# Patient Record
Sex: Male | Born: 1975 | Race: Black or African American | Hispanic: No | Smoking: Former smoker
Health system: Southern US, Community
[De-identification: ages and names within clinical notes are randomized; demographics above are authoritative.]

## PROBLEM LIST (undated history)

## (undated) DIAGNOSIS — G4730 Sleep apnea, unspecified: Secondary | ICD-10-CM

## (undated) DIAGNOSIS — I10 Essential (primary) hypertension: Secondary | ICD-10-CM

## (undated) DIAGNOSIS — S0990XA Unspecified injury of head, initial encounter: Secondary | ICD-10-CM

## (undated) DIAGNOSIS — D649 Anemia, unspecified: Secondary | ICD-10-CM

## (undated) DIAGNOSIS — F329 Major depressive disorder, single episode, unspecified: Secondary | ICD-10-CM

## (undated) DIAGNOSIS — M1990 Unspecified osteoarthritis, unspecified site: Secondary | ICD-10-CM

## (undated) DIAGNOSIS — F419 Anxiety disorder, unspecified: Secondary | ICD-10-CM

## (undated) DIAGNOSIS — F32A Depression, unspecified: Secondary | ICD-10-CM

## (undated) DIAGNOSIS — T8859XA Other complications of anesthesia, initial encounter: Secondary | ICD-10-CM

## (undated) DIAGNOSIS — Z9289 Personal history of other medical treatment: Secondary | ICD-10-CM

## (undated) DIAGNOSIS — E785 Hyperlipidemia, unspecified: Secondary | ICD-10-CM

## (undated) DIAGNOSIS — T7840XA Allergy, unspecified, initial encounter: Secondary | ICD-10-CM

## (undated) DIAGNOSIS — K219 Gastro-esophageal reflux disease without esophagitis: Secondary | ICD-10-CM

## (undated) DIAGNOSIS — T148XXA Other injury of unspecified body region, initial encounter: Secondary | ICD-10-CM

## (undated) HISTORY — PX: WRIST SURGERY: SHX841

## (undated) HISTORY — DX: Hyperlipidemia, unspecified: E78.5

## (undated) HISTORY — DX: Allergy, unspecified, initial encounter: T78.40XA

## (undated) HISTORY — PX: ELBOW SURGERY: SHX618

## (undated) HISTORY — DX: Depression, unspecified: F32.A

## (undated) HISTORY — PX: NECK SURGERY: SHX720

## (undated) HISTORY — DX: Major depressive disorder, single episode, unspecified: F32.9

## (undated) HISTORY — PX: HIP SURGERY: SHX245

---

## 2007-01-08 ENCOUNTER — Emergency Department (HOSPITAL_COMMUNITY): Admission: EM | Admit: 2007-01-08 | Discharge: 2007-01-08 | Payer: Self-pay | Admitting: Emergency Medicine

## 2011-05-25 ENCOUNTER — Other Ambulatory Visit: Payer: Self-pay | Admitting: Family Medicine

## 2011-05-25 ENCOUNTER — Emergency Department (HOSPITAL_COMMUNITY)
Admission: EM | Admit: 2011-05-25 | Discharge: 2011-05-25 | Disposition: A | Discharge status: Home / Self Care | Payer: 59 | Attending: Emergency Medicine | Admitting: Emergency Medicine

## 2011-05-25 ENCOUNTER — Emergency Department (HOSPITAL_COMMUNITY): Payer: 59

## 2011-05-25 ENCOUNTER — Inpatient Hospital Stay (INDEPENDENT_AMBULATORY_CARE_PROVIDER_SITE_OTHER)
Admission: RE | Admit: 2011-05-25 | Discharge: 2011-05-25 | Disposition: A | Discharge status: Home / Self Care | Payer: 59 | Source: Ambulatory Visit | Attending: Family Medicine | Admitting: Family Medicine

## 2011-05-25 DIAGNOSIS — K2970 Gastritis, unspecified, without bleeding: Secondary | ICD-10-CM | POA: Insufficient documentation

## 2011-05-25 DIAGNOSIS — R071 Chest pain on breathing: Secondary | ICD-10-CM

## 2011-05-25 DIAGNOSIS — E119 Type 2 diabetes mellitus without complications: Secondary | ICD-10-CM | POA: Insufficient documentation

## 2011-05-25 DIAGNOSIS — R1013 Epigastric pain: Secondary | ICD-10-CM | POA: Insufficient documentation

## 2011-05-25 DIAGNOSIS — K2990 Gastroduodenitis, unspecified, without bleeding: Secondary | ICD-10-CM | POA: Insufficient documentation

## 2011-05-25 DIAGNOSIS — I10 Essential (primary) hypertension: Secondary | ICD-10-CM | POA: Insufficient documentation

## 2011-05-25 DIAGNOSIS — Z79899 Other long term (current) drug therapy: Secondary | ICD-10-CM | POA: Insufficient documentation

## 2011-05-25 DIAGNOSIS — E669 Obesity, unspecified: Secondary | ICD-10-CM | POA: Insufficient documentation

## 2011-05-25 LAB — LIPASE, BLOOD
Lipase: 26 U/L (ref 11–59)
Lipase: 26 U/L (ref 11–59)

## 2011-05-25 LAB — GAMMA GT: GGT: 57 U/L — ABNORMAL HIGH (ref 7–51)

## 2011-05-25 LAB — DIFFERENTIAL
Basophils Absolute: 0 10*3/uL (ref 0.0–0.1)
Basophils Relative: 0 % (ref 0–1)
Eosinophils Absolute: 0.1 10*3/uL (ref 0.0–0.7)
Eosinophils Relative: 2 % (ref 0–5)
Lymphocytes Relative: 39 % (ref 12–46)
Lymphs Abs: 2.5 10*3/uL (ref 0.7–4.0)
Monocytes Absolute: 0.6 10*3/uL (ref 0.1–1.0)
Monocytes Relative: 8 % (ref 3–12)
Neutro Abs: 3.3 10*3/uL (ref 1.7–7.7)
Neutrophils Relative %: 51 % (ref 43–77)

## 2011-05-25 LAB — COMPREHENSIVE METABOLIC PANEL
ALT: 34 U/L (ref 0–53)
AST: 19 U/L (ref 0–37)
Albumin: 3.7 g/dL (ref 3.5–5.2)
Alkaline Phosphatase: 113 U/L (ref 39–117)
BUN: 12 mg/dL (ref 6–23)
CO2: 25 mEq/L (ref 19–32)
Calcium: 9 mg/dL (ref 8.4–10.5)
Chloride: 102 mEq/L (ref 96–112)
Creatinine, Ser: 0.82 mg/dL (ref 0.50–1.35)
GFR calc Af Amer: >60 mL/min (ref >60)
GFR calc non Af Amer: >60 mL/min (ref >60)
Glucose, Bld: 97 mg/dL (ref 70–99)
Potassium: 3.5 mEq/L (ref 3.5–5.1)
Sodium: 138 mEq/L (ref 135–145)
Total Bilirubin: 0.5 mg/dL (ref 0.3–1.2)
Total Protein: 7.4 g/dL (ref 6.0–8.3)

## 2011-05-25 LAB — CBC
HCT: 42 % (ref 39.0–52.0)
Hemoglobin: 15 g/dL (ref 13.0–17.0)
MCH: 31.4 pg (ref 26.0–34.0)
MCHC: 35.7 g/dL (ref 30.0–36.0)
MCV: 87.9 fL (ref 78.0–100.0)
Platelets: 210 10*3/uL (ref 150–400)
RBC: 4.78 MIL/uL (ref 4.22–5.81)
RDW: 12.6 % (ref 11.5–15.5)
WBC: 6.5 10*3/uL (ref 4.0–10.5)

## 2011-05-25 LAB — CK TOTAL AND CKMB (NOT AT ARMC)
CK, MB: 2.9 ng/mL (ref 0.3–4.0)
Relative Index: 1.2 (ref 0.0–2.5)
Total CK: 239 U/L — ABNORMAL HIGH (ref 7–232)

## 2011-05-25 LAB — TROPONIN I: Troponin I: <0.3 ng/mL (ref <0.30)

## 2011-05-25 LAB — AST: AST: 19 U/L (ref 0–37)

## 2011-05-25 LAB — BILIRUBIN, TOTAL: Total Bilirubin: 0.6 mg/dL (ref 0.3–1.2)

## 2011-05-25 LAB — ALT: ALT: 33 U/L (ref 0–53)

## 2011-05-28 LAB — POCT I-STAT, CHEM 8
BUN: 11 mg/dL (ref 6–23)
Calcium, Ion: 1.17 mmol/L (ref 1.12–1.32)
Chloride: 103 mEq/L (ref 96–112)
Creatinine, Ser: 0.9 mg/dL (ref 0.50–1.35)
Glucose, Bld: 144 mg/dL — ABNORMAL HIGH (ref 70–99)
HCT: 50 % (ref 39.0–52.0)
Hemoglobin: 17 g/dL (ref 13.0–17.0)
Potassium: 3.8 mEq/L (ref 3.5–5.1)
Sodium: 139 mEq/L (ref 135–145)
TCO2: 25 mmol/L (ref 0–100)

## 2011-08-29 ENCOUNTER — Ambulatory Visit (HOSPITAL_COMMUNITY)
Admission: RE | Admit: 2011-08-29 | Discharge: 2011-08-29 | Disposition: A | Discharge status: Home / Self Care | Payer: 59 | Attending: Psychiatry | Admitting: Psychiatry

## 2011-08-29 DIAGNOSIS — F411 Generalized anxiety disorder: Secondary | ICD-10-CM | POA: Insufficient documentation

## 2012-04-08 ENCOUNTER — Emergency Department (HOSPITAL_COMMUNITY)
Admission: EM | Admit: 2012-04-08 | Discharge: 2012-04-08 | Disposition: A | Discharge status: Home / Self Care | Payer: 59 | Source: Home / Self Care | Attending: Family Medicine | Admitting: Family Medicine

## 2012-04-08 ENCOUNTER — Encounter (HOSPITAL_COMMUNITY): Payer: Self-pay | Admitting: Emergency Medicine

## 2012-04-08 DIAGNOSIS — M65839 Other synovitis and tenosynovitis, unspecified forearm: Secondary | ICD-10-CM

## 2012-04-08 DIAGNOSIS — M778 Other enthesopathies, not elsewhere classified: Secondary | ICD-10-CM

## 2012-04-08 HISTORY — DX: Anxiety disorder, unspecified: F41.9

## 2012-04-08 HISTORY — DX: Essential (primary) hypertension: I10

## 2012-04-08 MED ORDER — DICLOFENAC SODIUM 1 % TD GEL: 1.0000 "application " | Freq: Four times a day (QID) | TRANSDERMAL | Status: DC

## 2012-04-08 NOTE — ED Notes (Signed)
Applied small ice pack to patient's lt forearm with sling for support

## 2012-04-08 NOTE — Discharge Instructions (Signed)
Use ice , gel and splint until  Pain and swelling resolve, see orthopedist if further problems.

## 2012-04-08 NOTE — ED Notes (Signed)
Left wrist pain and swelling, pain is a shooting pain.  Patient reports complaints started after getting up.  Patient reports repetitive motions.  Thumb is painful to move.

## 2012-04-08 NOTE — ED Provider Notes (Signed)
History     CSN: [id number]  Arrival date & time 04/08/12  1623   First MD Initiated Contact with Patient 04/08/12 1623      Chief Complaint  Patient presents with  . Wrist Pain    (Consider location/radiation/quality/duration/timing/severity/associated sxs/prior treatment) Patient is a 36 y.o. male presenting with wrist pain. The history is provided by the patient.  Wrist Pain This is a new problem. The current episode started 6 to 12 hours ago (onset after getting to work this am and doing repetitive job with both hands.). The problem has been gradually worsening.    Past Medical History  Diagnosis Date  . Diabetes mellitus   . Hypertension   . Anxiety     Past Surgical History  Procedure Date  . Hip surgery     "pins in hips"-"growth plate slipped"    No family history on file.  History  Substance Use Topics  . Smoking status: Current Everyday Smoker  . Smokeless tobacco: Not on file  . Alcohol Use: No      Review of Systems  Constitutional: Negative.   Musculoskeletal: Positive for joint swelling.  Neurological: Negative.     Allergies  Review of patient's allergies indicates no known allergies.  Home Medications   Current Outpatient Rx  Name Route Sig Dispense Refill  . IBUPROFEN 800 MG PO TABS Oral Take 800 mg by mouth every 8 (eight) hours as needed.    Marland Kitchen METFORMIN HCL PO Oral Take by mouth.    Marland Kitchen OVER THE COUNTER MEDICATION  insulin    . PAXIL PO Oral Take by mouth.    . DUETACT PO Oral Take by mouth.    . DICLOFENAC SODIUM 1 % TD GEL Topical Apply 1 application topically 4 (four) times daily. 4 gm per dose, please instruct. 100 g 1    BP 145/81  Pulse 94  Temp(Src) 99.3 F (37.4 C) (Oral)  Resp 20  SpO2 100%  Physical Exam  Nursing note and vitals reviewed. Constitutional: He is oriented to person, place, and time. He appears well-developed and well-nourished.  Musculoskeletal: He exhibits tenderness.       Arms: Neurological: He is  alert and oriented to person, place, and time. No cranial nerve deficit.  Skin: Skin is warm and dry.    ED Course  Procedures (including critical care time)  Labs Reviewed - No data to display No results found.   1. Tendonitis of wrist, left       MDM          Linna Hoff, MD 04/08/12 1743

## 2012-05-26 ENCOUNTER — Other Ambulatory Visit: Payer: Self-pay | Admitting: Occupational Medicine

## 2012-05-26 ENCOUNTER — Ambulatory Visit: Payer: Self-pay

## 2012-05-26 DIAGNOSIS — M25532 Pain in left wrist: Secondary | ICD-10-CM

## 2013-03-01 ENCOUNTER — Telehealth: Payer: Self-pay | Admitting: Family Medicine

## 2013-03-01 NOTE — Telephone Encounter (Signed)
PIOGLIAZ-GLIMEPIRIDE 30-4 MG TAKE 1 TABLET EVERYDAY #30 LAST RF 11/18/2012    TRIBENZOR 25 MG TAKE 1 TABLET EVERYDAY #30 LAST RF 11/18/2012  METFORMIN 1000MG  TAKE 1 TABLET TWICE DAILY #60 LAST RF 11/18/2012

## 2013-03-02 MED ORDER — PIOGLITAZONE HCL-GLIMEPIRIDE 30-2 MG PO TABS: 1.0000 | ORAL_TABLET | Freq: Every day | ORAL | Status: DC

## 2013-03-02 MED ORDER — METFORMIN HCL 1000 MG PO TABS: 1000.0000 mg | ORAL_TABLET | Freq: Two times a day (BID) | ORAL | Status: DC

## 2013-03-02 MED ORDER — OLMESARTAN-AMLODIPINE-HCTZ 40-10-25 MG PO TABS: 1.0000 | ORAL_TABLET | Freq: Every day | ORAL | Status: DC

## 2013-03-02 NOTE — Telephone Encounter (Signed)
Medication refilled per protocol.Patient needs to be seen before any further refills 

## 2013-03-02 NOTE — Addendum Note (Signed)
Addended by: Donne Anon on: 03/02/2013 11:59 AM   Modules accepted: Orders

## 2013-06-18 ENCOUNTER — Encounter (HOSPITAL_COMMUNITY): Payer: Self-pay | Admitting: Emergency Medicine

## 2013-06-18 ENCOUNTER — Emergency Department (HOSPITAL_COMMUNITY)
Admission: EM | Admit: 2013-06-18 | Discharge: 2013-06-18 | Disposition: A | Discharge status: Home / Self Care | Payer: 59 | Attending: Emergency Medicine | Admitting: Emergency Medicine

## 2013-06-18 DIAGNOSIS — E119 Type 2 diabetes mellitus without complications: Secondary | ICD-10-CM | POA: Insufficient documentation

## 2013-06-18 DIAGNOSIS — F17200 Nicotine dependence, unspecified, uncomplicated: Secondary | ICD-10-CM | POA: Insufficient documentation

## 2013-06-18 DIAGNOSIS — Z794 Long term (current) use of insulin: Secondary | ICD-10-CM | POA: Insufficient documentation

## 2013-06-18 DIAGNOSIS — R0989 Other specified symptoms and signs involving the circulatory and respiratory systems: Secondary | ICD-10-CM | POA: Insufficient documentation

## 2013-06-18 DIAGNOSIS — R0789 Other chest pain: Secondary | ICD-10-CM | POA: Insufficient documentation

## 2013-06-18 DIAGNOSIS — R0609 Other forms of dyspnea: Secondary | ICD-10-CM | POA: Insufficient documentation

## 2013-06-18 DIAGNOSIS — I10 Essential (primary) hypertension: Secondary | ICD-10-CM | POA: Insufficient documentation

## 2013-06-18 DIAGNOSIS — Z79899 Other long term (current) drug therapy: Secondary | ICD-10-CM | POA: Insufficient documentation

## 2013-06-18 DIAGNOSIS — F410 Panic disorder [episodic paroxysmal anxiety] without agoraphobia: Secondary | ICD-10-CM | POA: Insufficient documentation

## 2013-06-18 MED ORDER — LORAZEPAM 2 MG/ML IJ SOLN
1.0000 mg | INTRAMUSCULAR | Status: DC | PRN
  Administered 2013-06-18: 1 mg via INTRAVENOUS
  Filled 2013-06-18: qty 1

## 2013-06-18 MED ORDER — LORAZEPAM 1 MG PO TABS: 1.0000 mg | ORAL_TABLET | Freq: Three times a day (TID) | ORAL | Status: DC | PRN

## 2013-06-18 NOTE — ED Notes (Signed)
PER EMS- pt picked up from home with c/o anxiety attack.  Pt has hx of anxiety attack.  Pt reports he been under a lot of stress lately.  Pt has appt with psychiatrist within next couple weeks.  Sts he took a xanax from a friend, reports it helped his symptoms.

## 2013-06-18 NOTE — ED Notes (Signed)
Bed:WA05<BR> Expected date:<BR> Expected time:<BR> Means of arrival:<BR> Comments:<BR>

## 2013-06-18 NOTE — ED Provider Notes (Signed)
CSN: [id number]     Arrival date & time 06/18/13  1059 History     First MD Initiated Contact with Patient 06/18/13 1109     Chief Complaint  Patient presents with  . Panic Attack    HPI A 37 year old old history of panic attacks. He had an episode a day while at work where he started to feel anxious tired feel his breathing getting fast. His symptoms crescendoed he began feeling could not control his breathing. EMS was called by his coworkers he was transferred here. He arrives to get it. He has not had pain. He had an episode urine half ago for a significant episode of anxiety and transferred her to [facility] emergency room. He underwent testing and ruled out for an MI. Saw psychiatry and therapy. He had several more episodes over the next few months. He was on a 10 month to have absence from work because of his episodes of anxiety. Last March, he returned to work. He did well for a year. This March, 2014, he began having episodes of anxiety and contact his psychiatrist he is an intake appointment on August 19 which is less than 2 weeks from now.  He has pauses in the speech because of his dyspnea.   Past Medical History  Diagnosis Date  . Diabetes mellitus   . Hypertension   . Anxiety    Past Surgical History  Procedure Laterality Date  . Hip surgery      "pins in hips"-"growth plate slipped"   History reviewed. No pertinent family history. History  Substance Use Topics  . Smoking status: Current Every Day Smoker  . Smokeless tobacco: Not on file  . Alcohol Use: No    Review of Systems  Constitutional: Negative for fever, chills, diaphoresis, appetite change and fatigue.  HENT: Negative for sore throat, mouth sores and trouble swallowing.   Eyes: Negative for visual disturbance.  Respiratory: Positive for chest tightness. Negative for cough, shortness of breath and wheezing.   Cardiovascular: Positive for chest pain.  Gastrointestinal: Negative for nausea, vomiting, abdominal  pain, diarrhea and abdominal distention.  Endocrine: Negative for polydipsia, polyphagia and polyuria.  Genitourinary: Negative for dysuria, frequency and hematuria.  Musculoskeletal: Negative for gait problem.  Skin: Negative for color change, pallor and rash.  Neurological: Negative for dizziness, syncope, light-headedness and headaches.  Hematological: Does not bruise/bleed easily.  Psychiatric/Behavioral: Negative for behavioral problems and confusion. The patient is nervous/anxious.     Allergies  Review of patient's allergies indicates no known allergies.  Home Medications   Current Outpatient Rx  Name  Route  Sig  Dispense  Refill  . diclofenac sodium (VOLTAREN) 1 % GEL   Topical   Apply 2 g topically 4 (four) times daily as needed (For pain.).         Marland Kitchen ibuprofen (ADVIL,MOTRIN) 800 MG tablet   Oral   Take 800 mg by mouth every 8 (eight) hours as needed for pain.          . Insulin Glargine (LANTUS SOLOSTAR) 100 UNIT/ML SOPN   Subcutaneous   Inject 48 Units into the skin daily.         Marland Kitchen LORazepam (ATIVAN) 1 MG tablet   Oral   Take 1 tablet (1 mg total) by mouth 3 (three) times daily as needed for anxiety.   15 tablet   0   . metFORMIN (GLUCOPHAGE) 1000 MG tablet   Oral   Take 1 tablet (1,000 mg total) by mouth  2 (two) times daily with a meal.   60 tablet   0     Patient needs to be seen before any further refill ...   . Olmesartan-Amlodipine-HCTZ (TRIBENZOR) 40-10-25 MG TABS   Oral   Take 1 tablet by mouth daily.   30 tablet   0     Patient needs to be seen before any further refill ...   . PARoxetine (PAXIL) 20 MG tablet   Oral   Take 20 mg by mouth every morning.         . pioglitazone-glimepiride (DUETACT) 30-2 MG per tablet   Oral   Take 1 tablet by mouth daily with breakfast.   30 tablet   0     Patient needs to be seen before any further refill ...    There were no vitals taken for this visit. Physical Exam  Constitutional: He is  oriented to person, place, and time. He appears well-developed and well-nourished. He appears distressed.  HENT:  Head: Normocephalic.  Eyes: Conjunctivae are normal. Pupils are equal, round, and reactive to light. No scleral icterus.  Neck: Normal range of motion. Neck supple. No thyromegaly present.  Cardiovascular: Normal rate and regular rhythm.  Exam reveals no gallop, no S3, no S4, no distant heart sounds and no friction rub.   No murmur heard. Pulmonary/Chest: Breath sounds normal. Tachypnea noted. No respiratory distress. He has no decreased breath sounds. He has no wheezes. He has no rhonchi. He has no rales.  Abdominal: Soft. Bowel sounds are normal. He exhibits no distension. There is no tenderness. There is no rebound.  Musculoskeletal: Normal range of motion.  Neurological: He is alert and oriented to person, place, and time.  Skin: Skin is warm and dry. No rash noted.  Psychiatric: His behavior is normal. His mood appears anxious. His speech is rapid and/or pressured. Thought content is not delusional. Cognition and memory are not impaired. He does not express impulsivity.    ED Course   EKG interpretation normal sinus rhythm rate of 69 no ectopy no ST or T wave changes no change rhythm or morphology versus comparison to 05/25/2011. Procedures (including critical care time)  Labs Reviewed - No data to display No results found. 1. Panic attack     MDM  My initial impression is that this is anxiety his. His monitor shows a sinus rhythm. Plan will be EKG. Benzodiazepines.. Observation reevaluation for resolution of symptoms.  After the above I reevaluated Mr. Bartnik x2. His improving and that he is asleep he is awakening. He is able to ambulate he is not hypoxemic he felt like his control his heart rate. Plan will be to psychiatry for followup. And when necessary Ativan to use. He is given precautions regarding using as well driving or working. I left the decision as far as  his Paxil up to him. Advised him that he is to take it but it makes him sleepy to take it at work in a few hours of time at work and bedtime to sleep for his work again. If he choses, but he should not work with weakness. All additional outpatient treatment will defer to him and his psychiatrist  Claudean Kinds, MD 06/18/13 1259

## 2015-05-08 ENCOUNTER — Emergency Department
Admission: EM | Admit: 2015-05-08 | Discharge: 2015-05-08 | Disposition: A | Discharge status: Home / Self Care | Payer: 59 | Attending: Emergency Medicine | Admitting: Emergency Medicine

## 2015-05-08 ENCOUNTER — Encounter: Payer: Self-pay | Admitting: General Practice

## 2015-05-08 DIAGNOSIS — L02211 Cutaneous abscess of abdominal wall: Secondary | ICD-10-CM | POA: Diagnosis present

## 2015-05-08 DIAGNOSIS — I10 Essential (primary) hypertension: Secondary | ICD-10-CM | POA: Insufficient documentation

## 2015-05-08 DIAGNOSIS — L0291 Cutaneous abscess, unspecified: Secondary | ICD-10-CM

## 2015-05-08 DIAGNOSIS — E1165 Type 2 diabetes mellitus with hyperglycemia: Secondary | ICD-10-CM | POA: Diagnosis not present

## 2015-05-08 DIAGNOSIS — Z720 Tobacco use: Secondary | ICD-10-CM | POA: Insufficient documentation

## 2015-05-08 LAB — COMPREHENSIVE METABOLIC PANEL
ALT: 25 U/L (ref 17–63)
AST: 21 U/L (ref 15–41)
Albumin: 3.5 g/dL (ref 3.5–5.0)
Alkaline Phosphatase: 131 U/L — ABNORMAL HIGH (ref 38–126)
Anion gap: 8 (ref 5–15)
BUN: 15 mg/dL (ref 6–20)
CO2: 26 mmol/L (ref 22–32)
Calcium: 9.1 mg/dL (ref 8.9–10.3)
Chloride: 99 mmol/L — ABNORMAL LOW (ref 101–111)
Creatinine, Ser: 0.89 mg/dL (ref 0.61–1.24)
GFR calc Af Amer: >60 mL/min (ref >60)
GFR calc non Af Amer: >60 mL/min (ref >60)
Glucose, Bld: 446 mg/dL — ABNORMAL HIGH (ref 65–99)
Potassium: 4.4 mmol/L (ref 3.5–5.1)
Sodium: 133 mmol/L — ABNORMAL LOW (ref 135–145)
Total Bilirubin: 0.3 mg/dL (ref 0.3–1.2)
Total Protein: 7.4 g/dL (ref 6.5–8.1)

## 2015-05-08 LAB — CBC WITH DIFFERENTIAL/PLATELET
Basophils Absolute: 0.1 10*3/uL (ref 0–0.1)
Basophils Relative: 1 %
Eosinophils Absolute: 0.2 10*3/uL (ref 0–0.7)
Eosinophils Relative: 3 %
HCT: 45.7 % (ref 40.0–52.0)
Hemoglobin: 15.2 g/dL (ref 13.0–18.0)
Lymphocytes Relative: 28 %
Lymphs Abs: 2 10*3/uL (ref 1.0–3.6)
MCH: 30.4 pg (ref 26.0–34.0)
MCHC: 33.2 g/dL (ref 32.0–36.0)
MCV: 91.7 fL (ref 80.0–100.0)
Monocytes Absolute: 0.5 10*3/uL (ref 0.2–1.0)
Monocytes Relative: 8 %
Neutro Abs: 4.3 10*3/uL (ref 1.4–6.5)
Neutrophils Relative %: 60 %
Platelets: 204 10*3/uL (ref 150–440)
RBC: 4.98 MIL/uL (ref 4.40–5.90)
RDW: 13 % (ref 11.5–14.5)
WBC: 7.1 10*3/uL (ref 3.8–10.6)

## 2015-05-08 LAB — GLUCOSE, CAPILLARY: Glucose-Capillary: 380 mg/dL — ABNORMAL HIGH (ref 65–99)

## 2015-05-08 MED ORDER — SODIUM CHLORIDE 0.9 % IV SOLN
Freq: Once | INTRAVENOUS | Status: AC
  Administered 2015-05-08: 22:00:00 via INTRAVENOUS

## 2015-05-08 MED ORDER — MUPIROCIN CALCIUM 2 % EX CREA: TOPICAL_CREAM | CUTANEOUS | Status: DC

## 2015-05-08 MED ORDER — GLIPIZIDE 5 MG PO TABS: 5.0000 mg | ORAL_TABLET | Freq: Two times a day (BID) | ORAL | Status: DC

## 2015-05-08 MED ORDER — MUPIROCIN CALCIUM 2 % EX CREA
TOPICAL_CREAM | Freq: Once | CUTANEOUS | Status: AC
  Administered 2015-05-08: 23:00:00 via TOPICAL
  Filled 2015-05-08: qty 15

## 2015-05-08 MED ORDER — SULFAMETHOXAZOLE-TRIMETHOPRIM 800-160 MG PO TABS
ORAL_TABLET | ORAL | Status: AC
  Filled 2015-05-08: qty 2

## 2015-05-08 MED ORDER — METFORMIN HCL 1000 MG PO TABS: 1000.0000 mg | ORAL_TABLET | Freq: Two times a day (BID) | ORAL | Status: DC

## 2015-05-08 MED ORDER — INSULIN ASPART 100 UNIT/ML ~~LOC~~ SOLN
5.0000 [IU] | Freq: Once | SUBCUTANEOUS | Status: AC
  Administered 2015-05-08: 5 [IU] via SUBCUTANEOUS
  Filled 2015-05-08: qty 0.05

## 2015-05-08 MED ORDER — SULFAMETHOXAZOLE-TRIMETHOPRIM 800-160 MG PO TABS
2.0000 | ORAL_TABLET | Freq: Once | ORAL | Status: AC
  Administered 2015-05-08: 2 via ORAL

## 2015-05-08 MED ORDER — SULFAMETHOXAZOLE-TRIMETHOPRIM 800-160 MG PO TABS: 1.0000 | ORAL_TABLET | Freq: Two times a day (BID) | ORAL | Status: DC

## 2015-05-08 MED ORDER — INSULIN ASPART 100 UNIT/ML ~~LOC~~ SOLN
SUBCUTANEOUS | Status: AC
  Filled 2015-05-08: qty 1

## 2015-05-08 NOTE — ED Provider Notes (Signed)
[facility] Emergency Department Provider Note     Time seen: ----------------------------------------- 8:37 PM on 05/08/2015 -----------------------------------------    I have reviewed the triage vital signs and the nursing notes.   HISTORY  Chief Complaint Abscess    HPI Dwayne Rocha is a 39 y.o. male who presents ER from home with reports of expressive abscess to his lower abdominal wall. Patient states he has had an abscess about a month ago, states this was gotten bad with drainage. Complaint of dull pain there, nothing makes better worse. Patient also notes she's been off of his diabetes medication for some time. Most of his medication problems are insurance related. States he thinks she's had a fever, is not sure when he checked his blood sugar last   Past Medical History  Diagnosis Date  . Diabetes mellitus   . Hypertension   . Anxiety     There are no active problems to display for this patient.   Past Surgical History  Procedure Laterality Date  . Hip surgery      "pins in hips"-"growth plate slipped"  . Wrist surgery      Allergies Review of patient's allergies indicates no known allergies.  Social History History  Substance Use Topics  . Smoking status: Current Every Day Smoker -- 0.50 packs/day    Types: Cigarettes  . Smokeless tobacco: Not on file  . Alcohol Use: No    Review of Systems Constitutional: Positive for fever Eyes: Negative for visual changes. ENT: Negative for sore throat. Cardiovascular: Negative for chest pain. Respiratory: Negative for shortness of breath. Gastrointestinal: Negative for abdominal pain, vomiting and diarrhea. Genitourinary: Negative for dysuria. Musculoskeletal: Negative for back pain. Skin: Positive for abscess and drainage from the lower abdominal wall Neurological: Negative for headaches, focal weakness or numbness.  10-point ROS otherwise  negative.  ____________________________________________   PHYSICAL EXAM:  VITAL SIGNS: ED Triage Vitals  Enc Vitals Group     BP 05/08/15 1847 151/91 mmHg     Pulse Rate 05/08/15 1847 100     Resp 05/08/15 1847 19     Temp 05/08/15 1847 99.1 F (37.3 C)     Temp Source 05/08/15 1847 Oral     SpO2 05/08/15 1847 99 %     Weight 05/08/15 1847 295 lb (133.811 kg)     Height 05/08/15 1847 5\' 11"  (1.803 m)     Head Cir --      Peak Flow --      Pain Score 05/08/15 1848 6     Pain Loc --      Pain Edu? --      Excl. in GC? --     Constitutional: Alert and oriented. Well appearing and in no distress. Eyes: Conjunctivae are normal. PERRL. Normal extraocular movements. ENT   Head: Normocephalic and atraumatic.   Nose: No congestion/rhinnorhea.   Mouth/Throat: Mucous membranes are moist.   Neck: No stridor. Hematological/Lymphatic/Immunilogical: No cervical lymphadenopathy. Cardiovascular: Normal rate, regular rhythm. Normal and symmetric distal pulses are present in all extremities. No murmurs, rubs, or gallops. Respiratory: Normal respiratory effort without tachypnea nor retractions. Breath sounds are clear and equal bilaterally. No wheezes/rales/rhonchi. Gastrointestinal: Soft and nontender. No distention. No abdominal bruits. There is no CVA tenderness. Musculoskeletal: Nontender with normal range of motion in all extremities. No joint effusions.  No lower extremity tenderness nor edema. Neurologic:  Normal speech and language. No gross focal neurologic deficits are appreciated. Speech is normal. No gait instability. Skin:  Draining abscess is noted to the suprapubic region on the abdominal wall. Area is mildly tender to touch. Psychiatric: Mood and affect are normal. Speech and behavior are normal. Patient exhibits appropriate insight and judgment. ____________________________________________  ED COURSE:  Pertinent labs & imaging results that were available during  my care of the patient were reviewed by me and considered in my medical decision making (see chart for details). Patient will receive oral antibiotics, IV fluid and subcutaneous insulin to control his blood sugars here. He will also need Bactroban to cover for MRSA and proper wound dressings. ____________________________________________    LABS (pertinent positives/negatives)  Labs Reviewed  COMPREHENSIVE METABOLIC PANEL - Abnormal; Notable for the following:    Sodium 133 (*)    Chloride 99 (*)    Glucose, Bld 446 (*)    Alkaline Phosphatase 131 (*)    All other components within normal limits  CBC WITH DIFFERENTIAL/PLATELET    RADIOLOGY None  ____________________________________________  FINAL ASSESSMENT AND PLAN  Abscess, hyperglycemia  Plan: Patient received saline as well as subcutaneous insulin as dictated above. Blood sugars were trending down nicely. Patient will be restarted on Glucophage and will also add glipizide to his regimen. He'll be discharged with Septra to cover for his likely MRSA infection and Bactroban to be placed to the wound. He is also advised should the symptoms recur he can start treatment earlier with Bactroban. Advised to return for worsening or worrisome symptoms   Emily Filbert, MD   Emily Filbert, MD 05/08/15 2109

## 2015-05-08 NOTE — ED Notes (Signed)
Pharmacy called regarding bactroban, will send to ED

## 2015-05-08 NOTE — Discharge Instructions (Signed)
Abscess An abscess is an infected area that contains a collection of pus and debris.It can occur in almost any part of the body. An abscess is also known as a furuncle or boil. CAUSES  An abscess occurs when tissue gets infected. This can occur from blockage of oil or sweat glands, infection of hair follicles, or a minor injury to the skin. As the body tries to fight the infection, pus collects in the area and creates pressure under the skin. This pressure causes pain. People with weakened immune systems have difficulty fighting infections and get certain abscesses more often.  SYMPTOMS Usually an abscess develops on the skin and becomes a painful mass that is red, warm, and tender. If the abscess forms under the skin, you may feel a moveable soft area under the skin. Some abscesses break open (rupture) on their own, but most will continue to get worse without care. The infection can spread deeper into the body and eventually into the bloodstream, causing you to feel ill.  DIAGNOSIS  Your caregiver will take your medical history and perform a physical exam. A sample of fluid may also be taken from the abscess to determine what is causing your infection. TREATMENT  Your caregiver may prescribe antibiotic medicines to fight the infection. However, taking antibiotics alone usually does not cure an abscess. Your caregiver may need to make a small cut (incision) in the abscess to drain the pus. In some cases, gauze is packed into the abscess to reduce pain and to continue draining the area. HOME CARE INSTRUCTIONS   Only take over-the-counter or prescription medicines for pain, discomfort, or fever as directed by your caregiver.  If you were prescribed antibiotics, take them as directed. Finish them even if you start to feel better.  If gauze is used, follow your caregiver's directions for changing the gauze.  To avoid spreading the infection:  Keep your draining abscess covered with a  bandage.  Wash your hands well.  Do not share personal care items, towels, or whirlpools with others.  Avoid skin contact with others.  Keep your skin and clothes clean around the abscess.  Keep all follow-up appointments as directed by your caregiver. SEEK MEDICAL CARE IF:   You have increased pain, swelling, redness, fluid drainage, or bleeding.  You have muscle aches, chills, or a general ill feeling.  You have a fever. MAKE SURE YOU:   Understand these instructions.  Will watch your condition.  Will get help right away if you are not doing well or get worse. Document Released: 08/07/2005 Document Revised: 04/28/2012 Document Reviewed: 01/10/2012 [facility] Patient Information 2015 [place], [place]. This information is not intended to replace advice given to you by your health care provider. Make sure you discuss any questions you have with your health care provider.  Blood Glucose Monitoring Monitoring your blood glucose (also know as blood sugar) helps you to manage your diabetes. It also helps you and your health care provider monitor your diabetes and determine how well your treatment plan is working. WHY SHOULD YOU MONITOR YOUR BLOOD GLUCOSE?  It can help you understand how food, exercise, and medicine affect your blood glucose.  It allows you to know what your blood glucose is at any given moment. You can quickly tell if you are having low blood glucose (hypoglycemia) or high blood glucose (hyperglycemia).  It can help you and your health care provider know how to adjust your medicines.  It can help you understand how to manage an  illness or adjust medicine for exercise. °WHEN SHOULD YOU TEST? °Your health care provider will help you decide how often you should check your blood glucose. This may depend on the type of diabetes you have, your diabetes control, or the types of medicines you are taking. Be sure to write down all of your blood glucose readings so that this  information can be reviewed with your health care provider. See below for examples of testing times that your health care provider may suggest. °Type 1 Diabetes °· Test 4 times a day if you are in good control, using an insulin pump, or perform multiple daily injections. °· If your diabetes is not well controlled or if you are sick, you may need to monitor more often. °· It is a good idea to also monitor: °¨ Before and after exercise. °¨ Between meals and 2 hours after a meal. °¨ Occasionally between 2:00 a.m. and 3:00 a.m. °Type 2 Diabetes °· It can vary with each person, but generally, if you are on insulin, test 4 times a day. °· If you take medicines by mouth (orally), test 2 times a day. °· If you are on a controlled diet, test once a day. °· If your diabetes is not well controlled or if you are sick, you may need to monitor more often. °HOW TO MONITOR YOUR BLOOD GLUCOSE °Supplies Needed °· Blood glucose meter. °· Test strips for your meter. Each meter has its own strips. You must use the strips that go with your own meter. °· A pricking needle (lancet). °· A device that holds the lancet (lancing device). °· A journal or log book to write down your results. °Procedure °· Wash your hands with soap and water. Alcohol is not preferred. °· Prick the side of your finger (not the tip) with the lancet. °· Gently milk the finger until a small drop of blood appears. °· Follow the instructions that come with your meter for inserting the test strip, applying blood to the strip, and using your blood glucose meter. °Other Areas to Get Blood for Testing °Some meters allow you to use other areas of your body (other than your finger) to test your blood. These areas are called alternative sites. The most common alternative sites are: °· The forearm. °· The thigh. °· The back area of the lower leg. °· The palm of the hand. °The blood flow in these areas is slower. Therefore, the blood glucose values you get may be delayed, and  the numbers are different from what you would get from your fingers. Do not use alternative sites if you think you are having hypoglycemia. Your reading will not be accurate. Always use a finger if you are having hypoglycemia. Also, if you cannot feel your lows (hypoglycemia unawareness), always use your fingers for your blood glucose checks. °ADDITIONAL TIPS FOR GLUCOSE MONITORING °· Do not reuse lancets. °· Always carry your supplies with you. °· All blood glucose meters have a 24-hour "hotline" number to call if you have questions or need help. °· Adjust (calibrate) your blood glucose meter with a control solution after finishing a few boxes of strips. °BLOOD GLUCOSE RECORD KEEPING °It is a good idea to keep a daily record or log of your blood glucose readings. Most glucose meters, if not all, keep your glucose records stored in the meter. Some meters come with the ability to download your records to your home computer. Keeping a record of your blood glucose readings is especially helpful if   you are wanting to look for patterns. Make notes to go along with the blood glucose readings because you might forget what happened at that exact time. Keeping good records helps you and your health care provider to work together to achieve good diabetes management.  °Document Released: 10/31/2003 Document Revised: 03/14/2014 Document Reviewed: 03/22/2013 °[employer]® Patient Information ©2015 [employer]. This information is not intended to replace advice given to you by your health care provider. Make sure you discuss any questions you have with your health care provider. ° °

## 2015-05-08 NOTE — ED Notes (Signed)
Pt. Arrived to ed from home with reports of experiencing an abscess to lower abdomen. Pt verbalized that he experiences abscesses to this site but states "i has gotten bad", per pt. Drainage noted from site. Pt alert and oriented. Denies all over symptoms at this time.

## 2015-07-01 ENCOUNTER — Encounter: Payer: Self-pay | Admitting: Emergency Medicine

## 2015-07-01 ENCOUNTER — Emergency Department
Admission: EM | Admit: 2015-07-01 | Discharge: 2015-07-01 | Disposition: A | Discharge status: Home / Self Care | Payer: 59 | Attending: Emergency Medicine | Admitting: Emergency Medicine

## 2015-07-01 DIAGNOSIS — E1165 Type 2 diabetes mellitus with hyperglycemia: Secondary | ICD-10-CM | POA: Diagnosis not present

## 2015-07-01 DIAGNOSIS — R739 Hyperglycemia, unspecified: Secondary | ICD-10-CM

## 2015-07-01 DIAGNOSIS — I10 Essential (primary) hypertension: Secondary | ICD-10-CM | POA: Diagnosis not present

## 2015-07-01 DIAGNOSIS — Z794 Long term (current) use of insulin: Secondary | ICD-10-CM | POA: Insufficient documentation

## 2015-07-01 DIAGNOSIS — Z720 Tobacco use: Secondary | ICD-10-CM | POA: Insufficient documentation

## 2015-07-01 DIAGNOSIS — Z79899 Other long term (current) drug therapy: Secondary | ICD-10-CM | POA: Insufficient documentation

## 2015-07-01 DIAGNOSIS — L739 Follicular disorder, unspecified: Secondary | ICD-10-CM

## 2015-07-01 DIAGNOSIS — R220 Localized swelling, mass and lump, head: Secondary | ICD-10-CM | POA: Diagnosis present

## 2015-07-01 LAB — GLUCOSE, CAPILLARY: Glucose-Capillary: 341 mg/dL — ABNORMAL HIGH (ref 65–99)

## 2015-07-01 MED ORDER — SULFAMETHOXAZOLE-TRIMETHOPRIM 800-160 MG PO TABS
2.0000 | ORAL_TABLET | Freq: Once | ORAL | Status: AC
  Administered 2015-07-01: 2 via ORAL
  Filled 2015-07-01: qty 2

## 2015-07-01 MED ORDER — SULFAMETHOXAZOLE-TRIMETHOPRIM 800-160 MG PO TABS: 2.0000 | ORAL_TABLET | Freq: Two times a day (BID) | ORAL | Status: DC

## 2015-07-01 NOTE — ED Notes (Signed)
Pt states 'i have this lump under my chin". Pt denies pain, denies fever. Area is hard to touch, no redness noted.

## 2015-07-01 NOTE — Discharge Instructions (Signed)
Folliculitis  Folliculitis is redness, soreness, and swelling (inflammation) of the hair follicles. This condition can occur anywhere on the body. People with weakened immune systems, diabetes, or obesity have a greater risk of getting folliculitis. CAUSES  Bacterial infection. This is the most common cause.  Fungal infection.  Viral infection.  Contact with certain chemicals, especially oils and tars. Long-term folliculitis can result from bacteria that live in the nostrils. The bacteria may trigger multiple outbreaks of folliculitis over time. SYMPTOMS Folliculitis most commonly occurs on the scalp, thighs, legs, back, buttocks, and areas where hair is shaved frequently. An early sign of folliculitis is a small, white or yellow, pus-filled, itchy lesion (pustule). These lesions appear on a red, inflamed follicle. They are usually less than 0.2 inches (5 mm) wide. When there is an infection of the follicle that goes deeper, it becomes a boil or furuncle. A group of closely packed boils creates a larger lesion (carbuncle). Carbuncles tend to occur in hairy, sweaty areas of the body. DIAGNOSIS  Your caregiver can usually tell what is wrong by doing a physical exam. A sample may be taken from one of the lesions and tested in a lab. This can help determine what is causing your folliculitis. TREATMENT  Treatment may include:  Applying warm compresses to the affected areas.  Taking antibiotic medicines orally or applying them to the skin.  Draining the lesions if they contain a large amount of pus or fluid.  Laser hair removal for cases of long-lasting folliculitis. This helps to prevent regrowth of the hair. HOME CARE INSTRUCTIONS  Apply warm compresses to the affected areas as directed by your caregiver.  If antibiotics are prescribed, take them as directed. Finish them even if you start to feel better.  You may take over-the-counter medicines to relieve itching.  Do not shave  irritated skin.  Follow up with your caregiver as directed. SEEK IMMEDIATE MEDICAL CARE IF:   You have increasing redness, swelling, or pain in the affected area.  You have a fever. MAKE SURE YOU:  Understand these instructions.  Will watch your condition.  Will get help right away if you are not doing well or get worse. Document Released: 01/06/2002 Document Revised: 04/28/2012 Document Reviewed: 01/28/2012 [facility] Patient Information 2015 [place], [place]. This information is not intended to replace advice given to you by your health care provider. Make sure you discuss any questions you have with your health care provider.  Hyperglycemia Hyperglycemia occurs when the glucose (sugar) in your blood is too high. Hyperglycemia can happen for many reasons, but it most often happens to people who do not know they have diabetes or are not managing their diabetes properly.  CAUSES  Whether you have diabetes or not, there are other causes of hyperglycemia. Hyperglycemia can occur when you have diabetes, but it can also occur in other situations that you might not be as aware of, such as: Diabetes  If you have diabetes and are having problems controlling your blood glucose, hyperglycemia could occur because of some of the following reasons:  Not following your meal plan.  Not taking your diabetes medications or not taking it properly.  Exercising less or doing less activity than you normally do.  Being sick. Pre-diabetes  This cannot be ignored. Before people develop Type 2 diabetes, they almost always have "pre-diabetes." This is when your blood glucose levels are higher than normal, but not yet high enough to be diagnosed as diabetes. Research has shown that some long-term damage  to the body, especially the heart and circulatory system, may already be occurring during pre-diabetes. If you take action to manage your blood glucose when you have pre-diabetes, you may delay or prevent Type  2 diabetes from developing. Stress  If you have diabetes, you may be "diet" controlled or on oral medications or insulin to control your diabetes. However, you may find that your blood glucose is higher than usual in the hospital whether you have diabetes or not. This is often referred to as "stress hyperglycemia." Stress can elevate your blood glucose. This happens because of hormones put out by the body during times of stress. If stress has been the cause of your high blood glucose, it can be followed regularly by your caregiver. That way he/she can make sure your hyperglycemia does not continue to get worse or progress to diabetes. Steroids  Steroids are medications that act on the infection fighting system (immune system) to block inflammation or infection. One side effect can be a rise in blood glucose. Most people can produce enough extra insulin to allow for this rise, but for those who cannot, steroids make blood glucose levels go even higher. It is not unusual for steroid treatments to "uncover" diabetes that is developing. It is not always possible to determine if the hyperglycemia will go away after the steroids are stopped. A special blood test called an A1c is sometimes done to determine if your blood glucose was elevated before the steroids were started. SYMPTOMS  Thirsty.  Frequent urination.  Dry mouth.  Blurred vision.  Tired or fatigue.  Weakness.  Sleepy.  Tingling in feet or leg. DIAGNOSIS  Diagnosis is made by monitoring blood glucose in one or all of the following ways:  A1c test. This is a chemical found in your blood.  Fingerstick blood glucose monitoring.  Laboratory results. TREATMENT  First, knowing the cause of the hyperglycemia is important before the hyperglycemia can be treated. Treatment may include, but is not be limited to:  Education.  Change or adjustment in medications.  Change or adjustment in meal plan.  Treatment for an illness,  infection, etc.  More frequent blood glucose monitoring.  Change in exercise plan.  Decreasing or stopping steroids.  Lifestyle changes. HOME CARE INSTRUCTIONS   Test your blood glucose as directed.  Exercise regularly. Your caregiver will give you instructions about exercise. Pre-diabetes or diabetes which comes on with stress is helped by exercising.  Eat wholesome, balanced meals. Eat often and at regular, fixed times. Your caregiver or nutritionist will give you a meal plan to guide your sugar intake.  Being at an ideal weight is important. If needed, losing as little as 10 to 15 pounds may help improve blood glucose levels. SEEK MEDICAL CARE IF:   You have questions about medicine, activity, or diet.  You continue to have symptoms (problems such as increased thirst, urination, or weight gain). SEEK IMMEDIATE MEDICAL CARE IF:   You are vomiting or have diarrhea.  Your breath smells fruity.  You are breathing faster or slower.  You are very sleepy or incoherent.  You have numbness, tingling, or pain in your feet or hands.  You have chest pain.  Your symptoms get worse even though you have been following your caregiver's orders.  If you have any other questions or concerns. Document Released: 04/23/2001 Document Revised: 01/20/2012 Document Reviewed: 02/24/2012 [facility] Patient Information 2015 [place], [place]. This information is not intended to replace advice given to you by your health care  provider. Make sure you discuss any questions you have with your health care provider. ° °

## 2015-07-01 NOTE — ED Notes (Signed)
Patient with no complaints at this time. Respirations even and unlabored. Skin warm/dry. Discharge instructions reviewed with patient at this time. Patient given opportunity to voice concerns/ask questions. Patient discharged at this time and left Emergency Department with steady gait.   

## 2015-07-01 NOTE — ED Provider Notes (Addendum)
[facility] Emergency Department Provider Note  ____________________________________________  Time seen: Approximately 330 AM  I have reviewed the triage vital signs and the nursing notes.   HISTORY  Chief Complaint Abscess    HPI Dwayne Rocha is a 39 y.o. male with a history of diabetes who presents today with nonpainful swelling under his chin for the past 3 days. He said that he noticed this lump 3 days ago and has been slowly increasing in size. He says that it is nonpainful and there has been no drainage from the lump. He has no difficulty breathing or swallowing or controlling his secretions. No complaint of fever at home or sore throat. Says that he does get a lot of ingrown hairs when he shaves. He said that he last shaved yesterday as well as 5 days ago.   Past Medical History  Diagnosis Date  . Diabetes mellitus   . Hypertension   . Anxiety     There are no active problems to display for this patient.   Past Surgical History  Procedure Laterality Date  . Hip surgery      "pins in hips"-"growth plate slipped"  . Wrist surgery      Current Outpatient Rx  Name  Route  Sig  Dispense  Refill  . diclofenac sodium (VOLTAREN) 1 % GEL   Topical   Apply 2 g topically 4 (four) times daily as needed (For pain.).         Marland Kitchen glipiZIDE (GLUCOTROL) 5 MG tablet   Oral   Take 1 tablet (5 mg total) by mouth 2 (two) times daily.   60 tablet   11   . ibuprofen (ADVIL,MOTRIN) 800 MG tablet   Oral   Take 800 mg by mouth every 8 (eight) hours as needed for pain.          . Insulin Glargine (LANTUS SOLOSTAR) 100 UNIT/ML SOPN   Subcutaneous   Inject 48 Units into the skin daily.         Marland Kitchen LORazepam (ATIVAN) 1 MG tablet   Oral   Take 1 tablet (1 mg total) by mouth 3 (three) times daily as needed for anxiety.   15 tablet   0   . metFORMIN (GLUCOPHAGE) 1000 MG tablet   Oral   Take 1 tablet (1,000 mg total) by mouth 2 (two) times daily with  a meal.   60 tablet   0     Patient needs to be seen before any further refill ...   . metFORMIN (GLUCOPHAGE) 1000 MG tablet   Oral   Take 1 tablet (1,000 mg total) by mouth 2 (two) times daily with a meal.   60 tablet   11   . mupirocin cream (BACTROBAN) 2 %      Apply to affected area 3 times daily   30 g   0   . Olmesartan-Amlodipine-HCTZ (TRIBENZOR) 40-10-25 MG TABS   Oral   Take 1 tablet by mouth daily.   30 tablet   0     Patient needs to be seen before any further refill ...   . PARoxetine (PAXIL) 20 MG tablet   Oral   Take 20 mg by mouth every morning.         . pioglitazone-glimepiride (DUETACT) 30-2 MG per tablet   Oral   Take 1 tablet by mouth daily with breakfast.   30 tablet   0     Patient needs to be seen before any  further refill ...   . sulfamethoxazole-trimethoprim (BACTRIM DS) 800-160 MG per tablet   Oral   Take 1 tablet by mouth 2 (two) times daily.   20 tablet   0     Allergies Review of patient's allergies indicates no known allergies.  No family history on file.  Social History Social History  Substance Use Topics  . Smoking status: Current Every Day Smoker -- 0.50 packs/day    Types: Cigarettes  . Smokeless tobacco: None  . Alcohol Use: No    Review of Systems Constitutional: No fever/chills Eyes: No visual changes. ENT: No sore throat. Cardiovascular: Denies chest pain. Respiratory: Denies shortness of breath. Gastrointestinal: No abdominal pain.  No nausea, no vomiting.  No diarrhea.  No constipation. Genitourinary: Negative for dysuria. Musculoskeletal: Negative for back pain. Skin: Negative for rash. Neurological: Negative for headaches, focal weakness or numbness.  10-point ROS otherwise negative.  ____________________________________________   PHYSICAL EXAM:  VITAL SIGNS: ED Triage Vitals  Enc Vitals Group     BP 07/01/15 0127 155/92 mmHg     Pulse Rate 07/01/15 0127 99     Resp 07/01/15 0127 14      Temp 07/01/15 0127 98.8 F (37.1 C)     Temp Source 07/01/15 0127 Oral     SpO2 07/01/15 0127 96 %     Weight 07/01/15 0127 290 lb (131.543 kg)     Height 07/01/15 0127  (1.803 m)     Head Cir --      Peak Flow --      Pain Score --      Pain Loc --      Pain Edu? --      Excl. in GC? --     Constitutional: Alert and oriented. Well appearing and in no acute distress. Eyes: Conjunctivae are normal. PERRL. EOMI. Head: Atraumatic. Nose: No congestion/rhinnorhea. Mouth/Throat: Mucous membranes are moist.  Oropharynx non-erythematous. No edema to the floor the mouth. No swelling to the floor the mouth. Controlling secretions. Neck: No stridor.   Cardiovascular: Normal rate, regular rhythm. Grossly normal heart sounds.  Good peripheral circulation. Respiratory: Normal respiratory effort.  No retractions. Lungs CTAB. Gastrointestinal: Soft and nontender. No distention. No abdominal bruits. No CVA tenderness. Musculoskeletal: No lower extremity tenderness nor edema.  No joint effusions. Neurologic:  Normal speech and language. No gross focal neurologic deficits are appreciated. No gait instability. Skin: Multiple signs of trauma from shaving to the face as well as neck and under the mandible. Just posterior to the chin there is a palpable mass that is 3 cm in diameter. There is no fluctuance. There are several overlying areas that appears folliculitis. No pus or erythema to the area. The area is nontender. The mass is compressible and nonrigid. Psychiatric: Mood and affect are normal. Speech and behavior are normal.  ____________________________________________   LABS (all labs ordered are listed, but only abnormal results are displayed)  Labs Reviewed  GLUCOSE, CAPILLARY - Abnormal; Notable for the following:    Glucose-Capillary 341 (*)    All other components within normal limits    ____________________________________________  EKG   ____________________________________________  RADIOLOGY   ____________________________________________   PROCEDURES   ____________________________________________   INITIAL IMPRESSION / ASSESSMENT AND PLAN / ED COURSE  Pertinent labs & imaging results that were available during my care of the patient were reviewed by me and considered in my medical decision making (see chart for details).  Patient likely with furuncular reactive lymph node. Given  proximity to multiple areas of ingrown hairs/folliculitis I feel that the mass is likely reaction. Less likely to be Ludwig's angina. Patient controlling secretions well and in no acute respiratory distress. Glucose is elevated the patient says that he has been missing several doses of insulin from time to time. Counseled about the importance of keeping regular with his insulin as he may develop infections. Will follow-up with his primary care doctor for recheck of the mass as well as check of his glucose and insulin regimen. ____________________________________________   FINAL CLINICAL IMPRESSION(S) / ED DIAGNOSES  Acute submandibular mass likely reactive to folliculitis. Acute upper glycemia. Initial visit.    Myrna Blazer, MD 07/01/15 0410  No clinical signs of DKA despite hyperglycemia. No tachycardia, nausea vomiting or abdominal pain. Well appearing and in no acute distress.  Myrna Blazer, MD 07/01/15 [phone]

## 2015-09-30 ENCOUNTER — Inpatient Hospital Stay (HOSPITAL_COMMUNITY)
Admission: EM | Admit: 2015-09-30 | Discharge: 2015-10-04 | DRG: 603 | Disposition: A | Discharge status: Home / Self Care | Payer: 59 | Attending: Internal Medicine | Admitting: Internal Medicine

## 2015-09-30 ENCOUNTER — Encounter (HOSPITAL_COMMUNITY): Payer: Self-pay | Admitting: Emergency Medicine

## 2015-09-30 ENCOUNTER — Emergency Department (HOSPITAL_COMMUNITY): Payer: 59

## 2015-09-30 DIAGNOSIS — Z79899 Other long term (current) drug therapy: Secondary | ICD-10-CM | POA: Diagnosis not present

## 2015-09-30 DIAGNOSIS — K029 Dental caries, unspecified: Secondary | ICD-10-CM | POA: Diagnosis present

## 2015-09-30 DIAGNOSIS — F17210 Nicotine dependence, cigarettes, uncomplicated: Secondary | ICD-10-CM | POA: Diagnosis present

## 2015-09-30 DIAGNOSIS — Z7984 Long term (current) use of oral hypoglycemic drugs: Secondary | ICD-10-CM | POA: Diagnosis not present

## 2015-09-30 DIAGNOSIS — F419 Anxiety disorder, unspecified: Secondary | ICD-10-CM | POA: Diagnosis present

## 2015-09-30 DIAGNOSIS — R220 Localized swelling, mass and lump, head: Secondary | ICD-10-CM | POA: Diagnosis not present

## 2015-09-30 DIAGNOSIS — Z9114 Patient's other noncompliance with medication regimen: Secondary | ICD-10-CM

## 2015-09-30 DIAGNOSIS — L03211 Cellulitis of face: Principal | ICD-10-CM | POA: Diagnosis present

## 2015-09-30 DIAGNOSIS — E138 Other specified diabetes mellitus with unspecified complications: Secondary | ICD-10-CM | POA: Diagnosis not present

## 2015-09-30 DIAGNOSIS — E1165 Type 2 diabetes mellitus with hyperglycemia: Secondary | ICD-10-CM | POA: Diagnosis present

## 2015-09-30 DIAGNOSIS — I10 Essential (primary) hypertension: Secondary | ICD-10-CM | POA: Diagnosis present

## 2015-09-30 LAB — CBC WITH DIFFERENTIAL/PLATELET
Basophils Absolute: 0 10*3/uL (ref 0.0–0.1)
Basophils Relative: 0 %
Eosinophils Absolute: 0.2 10*3/uL (ref 0.0–0.7)
Eosinophils Relative: 2 %
HCT: 45.9 % (ref 39.0–52.0)
Hemoglobin: 15.6 g/dL (ref 13.0–17.0)
Lymphocytes Relative: 19 %
Lymphs Abs: 1.6 10*3/uL (ref 0.7–4.0)
MCH: 30.4 pg (ref 26.0–34.0)
MCHC: 34 g/dL (ref 30.0–36.0)
MCV: 89.3 fL (ref 78.0–100.0)
Monocytes Absolute: 0.7 10*3/uL (ref 0.1–1.0)
Monocytes Relative: 8 %
Neutro Abs: 6.1 10*3/uL (ref 1.7–7.7)
Neutrophils Relative %: 71 %
Platelets: 202 10*3/uL (ref 150–400)
RBC: 5.14 MIL/uL (ref 4.22–5.81)
RDW: 12.3 % (ref 11.5–15.5)
WBC: 8.6 10*3/uL (ref 4.0–10.5)

## 2015-09-30 LAB — BASIC METABOLIC PANEL
Anion gap: 8 (ref 5–15)
BUN: 8 mg/dL (ref 6–20)
CO2: 26 mmol/L (ref 22–32)
Calcium: 9 mg/dL (ref 8.9–10.3)
Chloride: 100 mmol/L — ABNORMAL LOW (ref 101–111)
Creatinine, Ser: 0.94 mg/dL (ref 0.61–1.24)
GFR calc Af Amer: >60 mL/min (ref >60)
GFR calc non Af Amer: >60 mL/min (ref >60)
Glucose, Bld: 335 mg/dL — ABNORMAL HIGH (ref 65–99)
Potassium: 3.8 mmol/L (ref 3.5–5.1)
Sodium: 134 mmol/L — ABNORMAL LOW (ref 135–145)

## 2015-09-30 LAB — I-STAT CG4 LACTIC ACID, ED
Lactic Acid, Venous: 2.02 mmol/L (ref 0.5–2.0)
Lactic Acid, Venous: 0.89 mmol/L (ref 0.5–2.0)

## 2015-09-30 LAB — I-STAT CHEM 8, ED
BUN: 8 mg/dL (ref 6–20)
Calcium, Ion: 1.09 mmol/L — ABNORMAL LOW (ref 1.12–1.23)
Chloride: 98 mmol/L — ABNORMAL LOW (ref 101–111)
Creatinine, Ser: 0.9 mg/dL (ref 0.61–1.24)
Glucose, Bld: 340 mg/dL — ABNORMAL HIGH (ref 65–99)
HCT: 52 % (ref 39.0–52.0)
Hemoglobin: 17.7 g/dL — ABNORMAL HIGH (ref 13.0–17.0)
Potassium: 3.7 mmol/L (ref 3.5–5.1)
Sodium: 136 mmol/L (ref 135–145)
TCO2: 22 mmol/L (ref 0–100)

## 2015-09-30 LAB — GLUCOSE, CAPILLARY: Glucose-Capillary: 331 mg/dL — ABNORMAL HIGH (ref 65–99)

## 2015-09-30 MED ORDER — ALBUTEROL SULFATE (2.5 MG/3ML) 0.083% IN NEBU: 2.5000 mg | INHALATION_SOLUTION | RESPIRATORY_TRACT | Status: DC | PRN

## 2015-09-30 MED ORDER — OXYCODONE HCL 5 MG PO TABS
5.0000 mg | ORAL_TABLET | ORAL | Status: DC | PRN
  Administered 2015-10-01 – 2015-10-04 (×10): 5 mg via ORAL
  Filled 2015-09-30 (×10): qty 1

## 2015-09-30 MED ORDER — HYDRALAZINE HCL 20 MG/ML IJ SOLN: 10.0000 mg | Freq: Four times a day (QID) | INTRAMUSCULAR | Status: DC | PRN

## 2015-09-30 MED ORDER — ACETAMINOPHEN 325 MG PO TABS
650.0000 mg | ORAL_TABLET | Freq: Four times a day (QID) | ORAL | Status: DC | PRN
  Administered 2015-10-01: 650 mg via ORAL
  Filled 2015-09-30: qty 2

## 2015-09-30 MED ORDER — ONDANSETRON HCL 4 MG/2ML IJ SOLN
4.0000 mg | Freq: Once | INTRAMUSCULAR | Status: AC
  Administered 2015-09-30: 4 mg via INTRAVENOUS
  Filled 2015-09-30: qty 2

## 2015-09-30 MED ORDER — VANCOMYCIN HCL 10 G IV SOLR
2000.0000 mg | Freq: Once | INTRAVENOUS | Status: AC
  Administered 2015-09-30: 2000 mg via INTRAVENOUS
  Filled 2015-09-30: qty 2000

## 2015-09-30 MED ORDER — AMPHETAMINE-DEXTROAMPHETAMINE 10 MG PO TABS: 30.0000 mg | ORAL_TABLET | Freq: Every day | ORAL | Status: DC | PRN

## 2015-09-30 MED ORDER — SODIUM CHLORIDE 0.9 % IV SOLN
INTRAVENOUS | Status: DC
  Administered 2015-09-30: 21:00:00 via INTRAVENOUS

## 2015-09-30 MED ORDER — TETANUS-DIPHTH-ACELL PERTUSSIS 5-2.5-18.5 LF-MCG/0.5 IM SUSP
0.5000 mL | Freq: Once | INTRAMUSCULAR | Status: AC
  Administered 2015-09-30: 0.5 mL via INTRAMUSCULAR
  Filled 2015-09-30: qty 0.5

## 2015-09-30 MED ORDER — ONDANSETRON HCL 4 MG/2ML IJ SOLN: 4.0000 mg | Freq: Four times a day (QID) | INTRAMUSCULAR | Status: DC | PRN

## 2015-09-30 MED ORDER — INSULIN ASPART 100 UNIT/ML ~~LOC~~ SOLN
0.0000 [IU] | Freq: Three times a day (TID) | SUBCUTANEOUS | Status: DC
  Administered 2015-10-01: 3 [IU] via SUBCUTANEOUS
  Administered 2015-10-01: 1 [IU] via SUBCUTANEOUS
  Administered 2015-10-01: 3 [IU] via SUBCUTANEOUS
  Administered 2015-10-02 (×3): 2 [IU] via SUBCUTANEOUS
  Administered 2015-10-03: 3 [IU] via SUBCUTANEOUS

## 2015-09-30 MED ORDER — SODIUM CHLORIDE 0.9 % IV SOLN
INTRAVENOUS | Status: AC
  Administered 2015-09-30: 21:00:00 via INTRAVENOUS

## 2015-09-30 MED ORDER — SODIUM CHLORIDE 0.9 % IV BOLUS (SEPSIS)
500.0000 mL | Freq: Once | INTRAVENOUS | Status: AC
  Administered 2015-09-30: 500 mL via INTRAVENOUS

## 2015-09-30 MED ORDER — MORPHINE SULFATE (PF) 4 MG/ML IV SOLN
4.0000 mg | Freq: Once | INTRAVENOUS | Status: AC
Start: 2015-09-30 — End: 2015-09-30
  Administered 2015-09-30: 4 mg via INTRAVENOUS
  Filled 2015-09-30: qty 1

## 2015-09-30 MED ORDER — ENOXAPARIN SODIUM 40 MG/0.4ML ~~LOC~~ SOLN
40.0000 mg | SUBCUTANEOUS | Status: DC
  Administered 2015-09-30 – 2015-10-01 (×2): 40 mg via SUBCUTANEOUS
  Filled 2015-09-30 (×2): qty 0.4

## 2015-09-30 MED ORDER — LORAZEPAM 1 MG PO TABS: 1.0000 mg | ORAL_TABLET | Freq: Three times a day (TID) | ORAL | Status: DC | PRN

## 2015-09-30 MED ORDER — CLINDAMYCIN PHOSPHATE 600 MG/50ML IV SOLN
600.0000 mg | Freq: Once | INTRAVENOUS | Status: AC
  Administered 2015-09-30: 600 mg via INTRAVENOUS
  Filled 2015-09-30: qty 50

## 2015-09-30 MED ORDER — PAROXETINE HCL 20 MG PO TABS
40.0000 mg | ORAL_TABLET | Freq: Every day | ORAL | Status: DC
  Administered 2015-09-30 – 2015-10-04 (×5): 40 mg via ORAL
  Filled 2015-09-30 (×5): qty 2

## 2015-09-30 MED ORDER — GLIPIZIDE 5 MG PO TABS
5.0000 mg | ORAL_TABLET | Freq: Two times a day (BID) | ORAL | Status: DC
  Administered 2015-09-30 – 2015-10-04 (×8): 5 mg via ORAL
  Filled 2015-09-30 (×8): qty 1

## 2015-09-30 MED ORDER — HYDROMORPHONE HCL 1 MG/ML IJ SOLN
1.0000 mg | Freq: Once | INTRAMUSCULAR | Status: AC
  Administered 2015-09-30: 1 mg via INTRAVENOUS
  Filled 2015-09-30: qty 1

## 2015-09-30 MED ORDER — ACETAMINOPHEN 650 MG RE SUPP: 650.0000 mg | Freq: Four times a day (QID) | RECTAL | Status: DC | PRN

## 2015-09-30 MED ORDER — IOHEXOL 300 MG/ML  SOLN
75.0000 mL | Freq: Once | INTRAMUSCULAR | Status: AC | PRN
  Administered 2015-09-30: 75 mL via INTRAVENOUS

## 2015-09-30 MED ORDER — SODIUM CHLORIDE 0.9 % IV BOLUS (SEPSIS)
1000.0000 mL | Freq: Once | INTRAVENOUS | Status: AC
  Administered 2015-09-30: 1000 mL via INTRAVENOUS

## 2015-09-30 MED ORDER — GUAIFENESIN-DM 100-10 MG/5ML PO SYRP
5.0000 mL | ORAL_SOLUTION | ORAL | Status: DC | PRN
Start: 2015-09-30 — End: 2015-10-04

## 2015-09-30 MED ORDER — VANCOMYCIN HCL IN DEXTROSE 1-5 GM/200ML-% IV SOLN
1000.0000 mg | Freq: Three times a day (TID) | INTRAVENOUS | Status: DC
  Administered 2015-10-01 – 2015-10-02 (×4): 1000 mg via INTRAVENOUS
  Filled 2015-09-30 (×6): qty 200

## 2015-09-30 MED ORDER — POLYETHYLENE GLYCOL 3350 17 G PO PACK
17.0000 g | PACK | Freq: Every day | ORAL | Status: DC
  Administered 2015-10-02: 17 g via ORAL
  Filled 2015-09-30 (×3): qty 1

## 2015-09-30 MED ORDER — MORPHINE SULFATE (PF) 2 MG/ML IV SOLN
2.0000 mg | INTRAVENOUS | Status: DC | PRN
  Administered 2015-10-02: 2 mg via INTRAVENOUS
  Filled 2015-09-30: qty 1

## 2015-09-30 MED ORDER — ONDANSETRON HCL 4 MG PO TABS: 4.0000 mg | ORAL_TABLET | Freq: Four times a day (QID) | ORAL | Status: DC | PRN

## 2015-09-30 MED ORDER — PIPERACILLIN-TAZOBACTAM 3.375 G IVPB
3.3750 g | Freq: Three times a day (TID) | INTRAVENOUS | Status: DC
  Administered 2015-09-30 – 2015-10-04 (×11): 3.375 g via INTRAVENOUS
  Filled 2015-09-30 (×13): qty 50

## 2015-09-30 MED ORDER — INSULIN GLARGINE 100 UNIT/ML ~~LOC~~ SOLN
12.0000 [IU] | Freq: Every day | SUBCUTANEOUS | Status: DC
  Administered 2015-09-30 – 2015-10-03 (×4): 12 [IU] via SUBCUTANEOUS
  Filled 2015-09-30 (×5): qty 0.12

## 2015-09-30 MED ORDER — MODAFINIL 100 MG PO TABS
200.0000 mg | ORAL_TABLET | Freq: Every morning | ORAL | Status: DC
  Administered 2015-10-01 – 2015-10-04 (×4): 200 mg via ORAL
  Filled 2015-09-30 (×4): qty 2

## 2015-09-30 NOTE — ED Notes (Signed)
Pt. Stated, I've had this swelling on my jaw since yesterday, and its not my teeth.  I had another infection under my jaw before and it was an infection.

## 2015-09-30 NOTE — H&P (Signed)
PATIENT DETAILS Name: Dwayne Rocha Age: 39 y.o. Sex: male Date of Birth: 12/25/1975 Admit Date: 09/30/2015 PCP:No PCP Per Patient Referring Physician:Dr Silverio LayYao   CHIEF COMPLAINT:  Right facial swelling 2 days  HPI: Dwayne PihWilliam Rocha is a 39 y.o. male with a Past Medical History of diabetes, hypertension, anxiety who presents today with the above noted complaint.per patient, yesterday he noted some right mid facial swelling which slowly increased during the course of the day. He woke up this morning and noted that his right upper and lower lip were also swollen, and that the swelling had increased in size much more. He claims that he can feel some mild swelling of his upper neck area on the right side. He was subsequently brought to the emergency room by his family, where a CT scan of the soft tissue showed similar to this without any focal fluid collection. I was subsequently asked to admit this patient for further evaluation and treatment. Per patient while-shaving yesterday he did sustain a small cut to the right side of his face-but he claims that the swelling preceded this cart. Patient denies any fever, denies any dysphagia. Patient denies any headache, visual symptoms, nausea, vomiting or diarrhea.  ALLERGIES:  No Known Allergies  PAST MEDICAL HISTORY: Past Medical History  Diagnosis Date  . Diabetes mellitus   . Hypertension   . Anxiety     PAST SURGICAL HISTORY: Past Surgical History  Procedure Laterality Date  . Hip surgery      "pins in hips"-"growth plate slipped"  . Wrist surgery      MEDICATIONS AT HOME: Prior to Admission medications   Medication Sig Start Date End Date Taking? 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  09/18/15  Yes Historical Provider, MD  Armodafinil (NUVIGIL) 250 MG tablet Take 250 mg by mouth See admin instructions. Take 1 tablet  (250 mg) by mouth at beginning of work day (varies according to shift scheduled)   Yes Historical Provider, MD  diphenhydrAMINE (BENADRYL) 25 MG tablet Take 25 mg by mouth every 6 (six) hours as needed (swelling).   Yes Historical Provider, MD  glipiZIDE (GLUCOTROL) 5 MG tablet Take 1 tablet (5 mg total) by mouth 2 (two) times daily. Patient taking differently: Take 5 mg by mouth daily before breakfast.  05/08/15 05/07/16 Yes Emily FilbertJonathan E Williams, MD  ibuprofen (ADVIL,MOTRIN) 200 MG tablet Take 800 mg by mouth every 6 (six) hours as needed (pain).   Yes Historical Provider, MD  LORazepam (ATIVAN) 1 MG tablet Take 1 tablet (1 mg total) by mouth 3 (three) times daily as needed for anxiety. Patient taking differently: Take 1 mg by mouth 3 (three) times daily as needed (panic attacks).  06/18/13  Yes Rolland PorterMark James, MD  metFORMIN (GLUCOPHAGE) 1000 MG tablet Take 1 tablet (1,000 mg total) by mouth 2 (two) times daily with a meal. 05/08/15 05/07/16 Yes Emily FilbertJonathan E Williams, MD  PARoxetine (PAXIL) 40 MG tablet Take 40 mg by mouth daily.   Yes Historical Provider, MD    FAMILY HISTORY: No family history of CAD  SOCIAL HISTORY:  reports that he has been smoking Cigarettes.  He has been smoking about 0.50 packs per day. He does not have any smokeless tobacco history on file. He reports that he does not drink alcohol or use illicit drugs. Lives at: Home Mobility:  Independent  REVIEW OF SYSTEMS:  Constitutional:  No  weight loss, night sweats,  Fevers, chills, fatigue.  HEENT:    No headaches, Dysphagia,Sore throat,   Cardio-vascular: No chest pain,Orthopnea, PND,lower extremity edema, anasarca, palpitations  GI:  No heartburn, indigestion, abdominal pain, nausea, vomiting, diarrhea, melena or hematochezia  Resp: No shortness of breath, cough, hemoptysis,plueritic chest pain.   Skin:  No rash or lesions.  GU:  No dysuria, change in color of urine, no urgency or frequency.  No flank  pain.  Musculoskeletal: No joint pain or swelling.  No decreased range of motion.  No back pain.  Endocrine: No heat intolerance, no cold intolerance, no polyuria, no polydipsia  Psych: No change in mood or affect. No depression or anxiety.  No memory loss.   PHYSICAL EXAM: Blood pressure 165/108, pulse 103, temperature 99.7 F (37.6 C), temperature source Oral, resp. rate 20, SpO2 100 %.  General appearance :Awake, alert, not in any distress. Speech Clear. Not toxic Looking HEENT: right mid facial swelling with mild swelling of his right upper and lower lip.In the right mid face area, there is a small opening that is not draining. Surrounding this opening that is approximately 4--5 cm indurated area without any fluctuation (see pic below)  Normocephalic, pupils equally reactive to light and accomodation Neck: supple, no JVD. No cervical lymphadenopathy.  Chest:Good air entry bilaterally, no added sounds  CVS: S1 S2 regular, no murmurs.  Abdomen: Bowel sounds present, Non tender and not distended with no gaurding, rigidity or rebound. Extremities: B/L Lower Ext shows no edema, both legs are warm to touch Neurology:  Non focal Skin:No Rash Wounds:N/A       LABS ON ADMISSION:   Recent Labs  09/30/15 1507 09/30/15 1531  NA 134* 136  K 3.8 3.7  CL 100* 98*  CO2 26  --   GLUCOSE 335* 340*  BUN 8 8  CREATININE 0.94 0.90  CALCIUM 9.0  --    No results for input(s): AST, ALT, ALKPHOS, BILITOT, PROT, ALBUMIN in the last 72 hours. No results for input(s): LIPASE, AMYLASE in the last 72 hours.  Recent Labs  09/30/15 1507 09/30/15 1531  WBC 8.6  --   NEUTROABS 6.1  --   HGB 15.6 17.7*  HCT 45.9 52.0  MCV 89.3  --   PLT 202  --    No results for input(s): CKTOTAL, CKMB, CKMBINDEX, TROPONINI in the last 72 hours. No results for input(s): DDIMER in the last 72 hours. Invalid input(s): POCBNP   RADIOLOGIC STUDIES ON ADMISSION: Ct Soft Tissue Neck W  Contrast  09/30/2015  CLINICAL DATA:  Pain and swelling in the right trauma for 2 days. Difficulty swallowing. EXAM: CT NECK WITH CONTRAST TECHNIQUE: Multidetector CT imaging of the neck was performed using the standard protocol following the bolus administration of intravenous contrast. CONTRAST:  75mL OMNIPAQUE IOHEXOL 300 MG/ML  SOLN COMPARISON:  None. FINDINGS: Pharynx and larynx: There is symmetric prominent thickening of the posterior nasopharyngeal soft tissues up to 2.0 cm thickness, which occludes the nasopharyngeal airway. There is symmetric mild hypertrophy of the palatine tonsils. There is mild symmetric soft tissue thickening at the base of the tongue. The epiglottis appears within normal limits. No retropharyngeal or peritonsillar fluid collections. Salivary glands: No mass, duct dilation or side cholelithiasis is seen in the parotids or submandibular glands. Thyroid: Within normal limits. Lymph nodes: There is relatively symmetric mild-to-moderate lymphadenopathy involving the bilateral levels 1a, 1b, 2, 3 and 4 cervical nodal chains. A representative right level 1A node  measures 1.8 cm (series 3/image 56) . Vascular: Within normal limits. Limited intracranial: No acute abnormality. Visualized orbits: No intraconal soft tissue abnormality. Mastoids and visualized paranasal sinuses: Non-opacified mastoids. 12 mm mucous retention cyst versus polyp in the medial left maxillary sinus. Skeleton: No aggressive appearing focal osseous lesions. There is a prominent dental carie in the posterior-most right maxillary molar (series 3/image 29). No periapical lucencies or bone erosions are visualized. There is prominent subcutaneous fat stranding and edema and overlying skin thickening in the right premaxillary and pre-mandibular soft tissues, with no focal fluid collection. Upper chest: No acute consolidative airspace disease or significant pulmonary nodules in the visualized lung apices. IMPRESSION: 1.  Nonspecific prominent subcutaneous fat stranding and edema and overlying skin thickening in the right premaxillary and pre-mandibular superficial soft tissues, with no focal fluid collection, most suggestive of cellulitis. 2. Large dental carie in the posterior-most right maxillary molar, with no periapical lucencies or bone erosions. 3. Relatively symmetric mild-to-moderate cervical lymphadenopathy as described, nonspecific. 4. Prominent symmetric thickening of the posterior nasopharyngeal soft tissues, which occludes the nasopharyngeal airway. Symmetric mild hypertrophy of the palatine tonsils. Mild symmetric soft tissue thickening at the base of the tongue. These findings are nonspecific and could be reactive, however lymphoma cannot be excluded. If symptoms do not improve on antibiotic therapy, ENT consultation and tissue sampling should be considered. Electronically Signed   By: Delbert Phenix M.D.   On: 09/30/2015 16:46    ASSESSMENT AND PLAN: Present on Admission:  . Facial cellulitis:for now this seems to be a soft tissue infection without involvement of any sinuses. The skin, buccal cavity and the hard palate all look viable without any necrosis areas.We will empirically start patient on vancomycin and Zosyn-an attempt to control blood sugars aggressively. His clinical course will need to be followed closely, although he has an area of induration on exam (? developing phlegmon)-there is no abscess seen on the CT scan.Case was discussed with ENT on call Dr. Jearld Fenton over the phone, who recommended to formally consult him if no improvement with antibiotics.  . DM (diabetes mellitus), secondary, with complications:Hold oral hypoglycemics-start Lantus 10 units and SSI. Follow closely.  . Anxiety:continue as needed Lorazepam  . HTN (hypertension)  .Prominent symmetric thickening of the posterior nasopharyngeal soft tissue:apparently obstructs the nasopharyngeal airway-suspect this is asympotmatic and  nonspecific and not related to his main complaint. Suspect this can be followed in the outpatient setting  .Large dental carie in the posterior-most right maxillary molar,with no periapical lucencies or bone erosions:outpatient dental evaluation  Further plan will depend as patient's clinical course evolves and further radiologic and laboratory data become available. Patient will be monitored closely.  Please note above plan was formulated after personal review and summarization of most recent inpatient/outpatient records.  Plan of care was also discussed with the consultant-Dr ENT with recommendations as stated above.  Above noted plan was discussed with patient/family face to face at bedside, they were in agreement.   CONSULTS: None  DVT Prophylaxis: Prophylactic Lovenox   Code Status: Full Code  Disposition Plan:  Discharge back home possibly in 1-2 days  Total time spent  55 minutes.Greater than 50% of this time was spent in counseling, explanation of diagnosis, planning of further management, and coordination of care.  [facility] Pager [phone]  If 7PM-7AM, please contact night-coverage [email or web address] Password [email or web address] 09/30/2015, 6:05 PM

## 2015-09-30 NOTE — Progress Notes (Signed)
ANTIBIOTIC CONSULT NOTE - INITIAL  Pharmacy Consult for Vancomycin and Zosyn Indication: Cellulitis, r/o abscess  No Known Allergies  Patient Measurements: Weight 131.8 kg Height 5'11" Vital Signs: Temp: 99.5 F (37.5 C) (11/19 1843) Temp Source: Oral (11/19 1843) BP: 165/98 mmHg (11/19 1843) Pulse Rate: 104 (11/19 1843) Labs:  Recent Labs  09/30/15 1507 09/30/15 1531  WBC 8.6  --   HGB 15.6 17.7*  PLT 202  --   CREATININE 0.94 0.90   CrCl cannot be calculated (Unknown ideal weight.). No results for input(s): VANCOTROUGH, VANCOPEAK, VANCORANDOM, GENTTROUGH, GENTPEAK, GENTRANDOM, TOBRATROUGH, TOBRAPEAK, TOBRARND, AMIKACINPEAK, AMIKACINTROU, AMIKACIN in the last 72 hours.   Microbiology: No results found for this or any previous visit (from the past 720 hour(s)).  Medical History: Past Medical History  Diagnosis Date  . Diabetes mellitus   . Hypertension   . Anxiety    Assessment: [age] year old male with uncontrolled diabetes and facial swelling/cellulits concerning for abscess to start vancomycin and Zosyn per pharmacy dosing.   WBC is within normal limits.  Scr is 0.90 - normalized CrCl >100 mL/min Lactic acid 2.02 >> 0.89  Patient is hypertensive and tachycardic with elevated BG.   Goal of Therapy:  Vancomycin trough level 10-15 mcg/ml  Clinical resolution of infection  Plan:  Zosyn 3.375g IV every 8 hours - 4hr infusion. Vancomycin 2g IV x1 then 1g IV q8h per dosing protocol. Monitor clinical status, culture results if any, and renal function.  Follow-up ability to narrow antibiotics.   Dwayne Rocha, PharmD, BCPS Clinical Pharmacist [phone] 09/30/2015,6:51 PM

## 2015-09-30 NOTE — ED Provider Notes (Signed)
CSN: [id number]     Arrival date & time 09/30/15  1416 History   First MD Initiated Contact with Patient 09/30/15 1427     Chief Complaint  Patient presents with  . Facial Laceration  . Jaw Pain  . Abscess   HPI   Angelina PihWilliam Dresner is a 39 y.o. M PMH significant for DM, HTN presenting with right sided facial swelling/pain since yesterday. He states the swelling began as a bump on his right cheek yesterday after shaving, and when he woke up this morning, it had spread to his lips, and is starting to encroach under his right eye. He describes his pain as 6/10 pain scale, discomfort, constant, non-radiating. He endorses visual changes (blurred vision today), dysphagia, history of this before. He denies fevers, chills, difficulty breathing, drooling, drainage, recent dental procedure/infection, alleviating or exacerbating factors.   Past Medical History  Diagnosis Date  . Diabetes mellitus   . Hypertension   . Anxiety    Past Surgical History  Procedure Laterality Date  . Hip surgery      "pins in hips"-"growth plate slipped"  . Wrist surgery     No family history on file. Social History  Substance Use Topics  . Smoking status: Current Every Day Smoker -- 0.50 packs/day    Types: Cigarettes  . Smokeless tobacco: None  . Alcohol Use: No    Review of Systems  Ten systems are reviewed and are negative for acute change except as noted in the HPI   Allergies  Review of patient's allergies indicates no known allergies.  Home Medications   Prior to Admission medications   Medication Sig Start Date End Date Taking? Authorizing Provider  diclofenac sodium (VOLTAREN) 1 % GEL Apply 2 g topically 4 (four) times daily as needed (For pain.).    Historical Provider, MD  glipiZIDE (GLUCOTROL) 5 MG tablet Take 1 tablet (5 mg total) by mouth 2 (two) times daily. 05/08/15 05/07/16  Emily FilbertJonathan E Williams, MD  ibuprofen (ADVIL,MOTRIN) 800 MG tablet Take 800 mg by mouth every 8 (eight) hours as  needed for pain.     Historical Provider, MD  Insulin Glargine (LANTUS SOLOSTAR) 100 UNIT/ML SOPN Inject 48 Units into the skin daily.    Historical Provider, MD  LORazepam (ATIVAN) 1 MG tablet Take 1 tablet (1 mg total) by mouth 3 (three) times daily as needed for anxiety. 06/18/13   Rolland PorterMark James, MD  metFORMIN (GLUCOPHAGE) 1000 MG tablet Take 1 tablet (1,000 mg total) by mouth 2 (two) times daily with a meal. 03/02/13   Donita BrooksWarren T Pickard, MD  metFORMIN (GLUCOPHAGE) 1000 MG tablet Take 1 tablet (1,000 mg total) by mouth 2 (two) times daily with a meal. 05/08/15 05/07/16  Emily FilbertJonathan E Williams, MD  mupirocin cream Idelle Jo(BACTROBAN) 2 % Apply to affected area 3 times daily 05/08/15 05/07/16  Emily FilbertJonathan E Williams, MD  Olmesartan-Amlodipine-HCTZ [facility](TRIBENZOR) 40-10-25 MG TABS Take 1 tablet by mouth daily. 03/02/13   Donita BrooksWarren T Pickard, MD  PARoxetine (PAXIL) 20 MG tablet Take 20 mg by mouth every morning.    Historical Provider, MD  pioglitazone-glimepiride (DUETACT) 30-2 MG per tablet Take 1 tablet by mouth daily with breakfast. 03/02/13   Donita BrooksWarren T Pickard, MD  sulfamethoxazole-trimethoprim (BACTRIM DS,SEPTRA DS) 800-160 MG per tablet Take 2 tablets by mouth 2 (two) times daily. 07/01/15   Myrna Blazeravid Matthew Schaevitz, MD   BP 168/106 mmHg  Pulse 121  Temp(Src) 99.7 F (37.6 C) (Oral)  Resp 20  SpO2 99% Physical Exam  Constitutional: He appears well-developed and well-nourished. No distress.  HENT:  Head: Normocephalic and atraumatic.  Mouth/Throat: Oropharynx is clear and moist. No oropharyngeal exudate.  Eyes: Conjunctivae are normal. Pupils are equal, round, and reactive to light. Right eye exhibits no discharge. Left eye exhibits no discharge. No scleral icterus.  Neck: No tracheal deviation present.  Cardiovascular: Normal rate, regular rhythm, normal heart sounds and intact distal pulses.  Exam reveals no gallop and no friction rub.   No murmur heard. Pulmonary/Chest: Effort normal and breath sounds normal. No  respiratory distress. He has no wheezes. He has no rales. He exhibits no tenderness.  Abdominal: Soft. Bowel sounds are normal. He exhibits no distension and no mass. There is no tenderness. There is no rebound and no guarding.  Musculoskeletal: He exhibits no edema.  Lymphadenopathy:    He has no cervical adenopathy.  Neurological: He is alert. Coordination normal.  Skin: Skin is warm and dry. No rash noted. He is not diaphoretic. No erythema.  Area of induration around right zygoma. Wound without drainage inferior to area of induration.   Psychiatric: He has a normal mood and affect. His behavior is normal.  Nursing note and vitals reviewed.   ED Course  Procedures (including critical care time) Labs Review Labs Reviewed  BASIC METABOLIC PANEL  CBC WITH DIFFERENTIAL/PLATELET  I-STAT CG4 LACTIC ACID, ED    Imaging Review Ct Soft Tissue Neck W Contrast  09/30/2015  CLINICAL DATA:  Pain and swelling in the right trauma for 2 days. Difficulty swallowing. EXAM: CT NECK WITH CONTRAST TECHNIQUE: Multidetector CT imaging of the neck was performed using the standard protocol following the bolus administration of intravenous contrast. CONTRAST:  75mL OMNIPAQUE IOHEXOL 300 MG/ML  SOLN COMPARISON:  None. FINDINGS: Pharynx and larynx: There is symmetric prominent thickening of the posterior nasopharyngeal soft tissues up to 2.0 cm thickness, which occludes the nasopharyngeal airway. There is symmetric mild hypertrophy of the palatine tonsils. There is mild symmetric soft tissue thickening at the base of the tongue. The epiglottis appears within normal limits. No retropharyngeal or peritonsillar fluid collections. Salivary glands: No mass, duct dilation or side cholelithiasis is seen in the parotids or submandibular glands. Thyroid: Within normal limits. Lymph nodes: There is relatively symmetric mild-to-moderate lymphadenopathy involving the bilateral levels 1a, 1b, 2, 3 and 4 cervical nodal chains. A  representative right level 1A node measures 1.8 cm (series 3/image 56) . Vascular: Within normal limits. Limited intracranial: No acute abnormality. Visualized orbits: No intraconal soft tissue abnormality. Mastoids and visualized paranasal sinuses: Non-opacified mastoids. 12 mm mucous retention cyst versus polyp in the medial left maxillary sinus. Skeleton: No aggressive appearing focal osseous lesions. There is a prominent dental carie in the posterior-most right maxillary molar (series 3/image 29). No periapical lucencies or bone erosions are visualized. There is prominent subcutaneous fat stranding and edema and overlying skin thickening in the right premaxillary and pre-mandibular soft tissues, with no focal fluid collection. Upper chest: No acute consolidative airspace disease or significant pulmonary nodules in the visualized lung apices. IMPRESSION: 1. Nonspecific prominent subcutaneous fat stranding and edema and overlying skin thickening in the right premaxillary and pre-mandibular superficial soft tissues, with no focal fluid collection, most suggestive of cellulitis. 2. Large dental carie in the posterior-most right maxillary molar, with no periapical lucencies or bone erosions. 3. Relatively symmetric mild-to-moderate cervical lymphadenopathy as described, nonspecific. 4. Prominent symmetric thickening of the posterior nasopharyngeal soft tissues, which occludes the nasopharyngeal airway. Symmetric mild hypertrophy of the palatine  tonsils. Mild symmetric soft tissue thickening at the base of the tongue. These findings are nonspecific and could be reactive, however lymphoma cannot be excluded. If symptoms do not improve on antibiotic therapy, ENT consultation and tissue sampling should be considered. Electronically Signed   By: Delbert Phenix M.D.   On: 09/30/2015 16:46   I have personally reviewed and evaluated these images and lab results as part of my medical decision-making.   MDM   Final  diagnoses:  Facial cellulitis   Patient non-toxic appearing and hypertensive; otherwise, VSS. Most likely facial cellulitis; however, will CT to rule out abscess.   CBC unremarkable. BMP reveals hyperglycemia of 335. CT reveals most likely cellulitis with a large dental carie. ENT consultation is advised. Area of induration on exam- patient will need to be admitted.   Dr. Silverio Lay placed consult call and advised IV clindamycin.    Melton Krebs, PA-C 10/13/15 1425  Nelva Nay, MD 10/16/15 [phone]

## 2015-09-30 NOTE — ED Notes (Signed)
Patient transported to CT 

## 2015-10-01 LAB — GLUCOSE, CAPILLARY
Glucose-Capillary: 130 mg/dL — ABNORMAL HIGH (ref 65–99)
Glucose-Capillary: 231 mg/dL — ABNORMAL HIGH (ref 65–99)
Glucose-Capillary: 234 mg/dL — ABNORMAL HIGH (ref 65–99)
Glucose-Capillary: 202 mg/dL — ABNORMAL HIGH (ref 65–99)

## 2015-10-01 LAB — BASIC METABOLIC PANEL
Anion gap: 6 (ref 5–15)
BUN: 8 mg/dL (ref 6–20)
CO2: 26 mmol/L (ref 22–32)
Calcium: 8.6 mg/dL — ABNORMAL LOW (ref 8.9–10.3)
Chloride: 104 mmol/L (ref 101–111)
Creatinine, Ser: 0.91 mg/dL (ref 0.61–1.24)
GFR calc Af Amer: >60 mL/min (ref >60)
GFR calc non Af Amer: >60 mL/min (ref >60)
Glucose, Bld: 97 mg/dL (ref 65–99)
Potassium: 3.6 mmol/L (ref 3.5–5.1)
Sodium: 136 mmol/L (ref 135–145)

## 2015-10-01 LAB — PROTIME-INR
INR: 1.07 (ref 0.00–1.49)
Prothrombin Time: 14.1 seconds (ref 11.6–15.2)

## 2015-10-01 LAB — CBC
HCT: 41 % (ref 39.0–52.0)
Hemoglobin: 14 g/dL (ref 13.0–17.0)
MCH: 30.4 pg (ref 26.0–34.0)
MCHC: 34.1 g/dL (ref 30.0–36.0)
MCV: 89.1 fL (ref 78.0–100.0)
Platelets: 207 10*3/uL (ref 150–400)
RBC: 4.6 MIL/uL (ref 4.22–5.81)
RDW: 12.5 % (ref 11.5–15.5)
WBC: 8.5 10*3/uL (ref 4.0–10.5)

## 2015-10-01 MED ORDER — IRBESARTAN 150 MG PO TABS
75.0000 mg | ORAL_TABLET | Freq: Every day | ORAL | Status: DC
  Administered 2015-10-01 – 2015-10-04 (×4): 75 mg via ORAL
  Filled 2015-10-01 (×4): qty 1

## 2015-10-01 MED ORDER — CHLORHEXIDINE GLUCONATE 0.12 % MT SOLN
15.0000 mL | Freq: Two times a day (BID) | OROMUCOSAL | Status: DC
  Administered 2015-10-01 – 2015-10-04 (×7): 15 mL via OROMUCOSAL
  Filled 2015-10-01 (×7): qty 15

## 2015-10-01 MED ORDER — CETYLPYRIDINIUM CHLORIDE 0.05 % MT LIQD
7.0000 mL | Freq: Two times a day (BID) | OROMUCOSAL | Status: DC
  Administered 2015-10-01 – 2015-10-03 (×5): 7 mL via OROMUCOSAL

## 2015-10-01 NOTE — Progress Notes (Signed)
Utilization Review Completed.Courtnay Petrilla T11/20/2016  

## 2015-10-01 NOTE — Progress Notes (Signed)
[facility] PROGRESS NOTE  Dwayne Rocha AVW:[id number] DOB: 08/19/1976 DOA: 09/30/2015  PCP: He has not had a PCP in many months. Would like to reestablish care with an outpatient provider.  Brief HPI: 39 year old African-American male with a past medical history of diabetes, hypertension, anxiety who presented with the right facial swelling ongoing for 2 days. Patient presented to the emergency department. He underwent CT scan of the area of concern. There was no focal fluid collection. Findings suggestive of cellulitis was noted. Patient was admitted for further management including IV antibiotics.  Past medical history:  Past Medical History  Diagnosis Date  . Diabetes mellitus   . Hypertension   . Anxiety     Consultants: Admitting physician discussed the case with Dr. Jearld Fenton.  Procedures: None  Antibiotics: Vancomycin and Zosyn 11/19  Subjective: Patient feels about the same as yesterday. He feels the swelling may have increased some overnight. Denies any worsening in pain. No chills. No nausea, vomiting. No headaches. No visual disturbances. No drainage from his nostrils. No difficulty with swallowing.  Objective: Vital Signs  Filed Vitals:   09/30/15 1800 09/30/15 1843 10/01/15 0101 10/01/15 0526  BP: 148/102 165/98 158/97 150/95  Pulse: 108 104  101  Temp:  99.5 F (37.5 C)  98.9 F (37.2 C)  TempSrc:  Oral    Resp:  17  16  SpO2: 100% 96%  94%   No intake or output data in the 24 hours ending 10/01/15 0808 There were no vitals filed for this visit.  General appearance: alert, cooperative, appears stated age and no distress HEENT: Right facial swelling is appreciated. Also noted around right orbit. Scab-like lesion noted in the right cheek. No drainage. Induration present. No fluctuation. No obvious concerning abnormalities in the nostrils. Nor in the oral cavity. Resp: clear to auscultation bilaterally Cardio: regular rate and rhythm, S1, S2 normal,  no murmur, click, rub or gallop GI: soft, non-tender; bowel sounds normal; no masses,  no organomegaly Extremities: extremities normal, atraumatic, no cyanosis or edema Neurologic: No focal deficits.  Lab Results:  Basic Metabolic Panel:  Recent Labs Lab 09/30/15 1507 09/30/15 1531 10/01/15 0220  NA 134* 136 136  K 3.8 3.7 3.6  CL 100* 98* 104  CO2 26  --  26  GLUCOSE 335* 340* 97  BUN CREATININE 0.94 0.90 0.91  CALCIUM 9.0  --  8.6*   CBC:  Recent Labs Lab 09/30/15 1507 09/30/15 1531 10/01/15 0220  WBC 8.6  --  8.5  NEUTROABS 6.1  --   --   HGB 15.6 17.7* 14.0  HCT 45.9 52.0 41.0  MCV 89.3  --  89.1  PLT 202  --  207   CBG:  Recent Labs Lab 09/30/15 2146 10/01/15 0630  GLUCAP 331* 130*    No results found for this or any previous visit (from the past 240 hour(s)).    Studies/Results: Ct Soft Tissue Neck W Contrast  09/30/2015  CLINICAL DATA:  Pain and swelling in the right trauma for 2 days. Difficulty swallowing. EXAM: CT NECK WITH CONTRAST TECHNIQUE: Multidetector CT imaging of the neck was performed using the standard protocol following the bolus administration of intravenous contrast. CONTRAST:  75mL OMNIPAQUE IOHEXOL 300 MG/ML  SOLN COMPARISON:  None. FINDINGS: Pharynx and larynx: There is symmetric prominent thickening of the posterior nasopharyngeal soft tissues up to 2.0 cm thickness, which occludes the nasopharyngeal airway. There is symmetric mild hypertrophy of the palatine tonsils. There is  mild symmetric soft tissue thickening at the base of the tongue. The epiglottis appears within normal limits. No retropharyngeal or peritonsillar fluid collections. Salivary glands: No mass, duct dilation or side cholelithiasis is seen in the parotids or submandibular glands. Thyroid: Within normal limits. Lymph nodes: There is relatively symmetric mild-to-moderate lymphadenopathy involving the bilateral levels 1a, 1b, 2, 3 and 4 cervical nodal chains. A  representative right level 1A node measures 1.8 cm (series 3/image 56) . Vascular: Within normal limits. Limited intracranial: No acute abnormality. Visualized orbits: No intraconal soft tissue abnormality. Mastoids and visualized paranasal sinuses: Non-opacified mastoids. 12 mm mucous retention cyst versus polyp in the medial left maxillary sinus. Skeleton: No aggressive appearing focal osseous lesions. There is a prominent dental carie in the posterior-most right maxillary molar (series 3/image 29). No periapical lucencies or bone erosions are visualized. There is prominent subcutaneous fat stranding and edema and overlying skin thickening in the right premaxillary and pre-mandibular soft tissues, with no focal fluid collection. Upper chest: No acute consolidative airspace disease or significant pulmonary nodules in the visualized lung apices. IMPRESSION: 1. Nonspecific prominent subcutaneous fat stranding and edema and overlying skin thickening in the right premaxillary and pre-mandibular superficial soft tissues, with no focal fluid collection, most suggestive of cellulitis. 2. Large dental carie in the posterior-most right maxillary molar, with no periapical lucencies or bone erosions. 3. Relatively symmetric mild-to-moderate cervical lymphadenopathy as described, nonspecific. 4. Prominent symmetric thickening of the posterior nasopharyngeal soft tissues, which occludes the nasopharyngeal airway. Symmetric mild hypertrophy of the palatine tonsils. Mild symmetric soft tissue thickening at the base of the tongue. These findings are nonspecific and could be reactive, however lymphoma cannot be excluded. If symptoms do not improve on antibiotic therapy, ENT consultation and tissue sampling should be considered. Electronically Signed   By: Delbert Phenix M.D.   On: 09/30/2015 16:46    Medications:  Scheduled: . antiseptic oral rinse  7 mL Mouth Rinse q12n4p  . chlorhexidine  15 mL Mouth Rinse BID  . enoxaparin  (LOVENOX) injection  40 mg Subcutaneous Q24H  . glipiZIDE  5 mg Oral BID AC  . insulin aspart  0-9 Units Subcutaneous TID WC  . insulin glargine  12 Units Subcutaneous QHS  . modafinil  200 mg Oral q morning - 10a  . PARoxetine  40 mg Oral Daily  . piperacillin-tazobactam (ZOSYN)  IV  3.375 g Intravenous Q8H  . polyethylene glycol  17 g Oral Daily  . vancomycin  1,000 mg Intravenous Q8H   Continuous: . sodium chloride 100 mL/hr at 09/30/15 2124   HQI:ONGEXBMWUXLKG **OR** acetaminophen, albuterol, amphetamine-dextroamphetamine, guaiFENesin-dextromethorphan, hydrALAZINE, LORazepam, morphine injection, ondansetron **OR** ondansetron (ZOFRAN) IV, oxyCODONE  Assessment/Plan:  Principal Problem:   Facial cellulitis Active Problems:   DM (diabetes mellitus), secondary, with complications (HCC)   Anxiety   HTN (hypertension)    Facial cellulitis This appears to be a soft tissue infection without involvement of any sinuses or orbits. Continue vancomycin and Zosyn. We'll wait for clinical improvement. According to the patient swelling perhaps is somewhat worse today compared to yesterday. Wait for antibiotics to take effect. Case was discussed with ENT on call Dr. Jearld Fenton over the phone the time of admission. Warm compresses.  Type 2 DM (diabetes mellitus) Continue on Lantus. CBGs are improved. Continue to monitor closely. Check HbA1c. Patient hasn't taken his oral medications consistently over the past many months. He hasn't followed up with any provider in the outpatient setting. Patient does have insurance. He will be  provided with a list of outpatient providers.  Anxiety Continue as needed Lorazepam  Essential HTN (hypertension) Start Avapro  Prominent symmetric thickening of the posterior nasopharyngeal soft tissue Apparently obstructs the nasopharyngeal airway. Probably not related to his presenting illness. Can be followed up as outpatient.  Large dental carie in the  posterior-most right maxillary molar,with no periapical lucencies or bone erosions Outpatient dental evaluation   DVT Prophylaxis: Lovenox    Code Status: Full code  Family Communication: Discussed with the patient  Disposition Plan: Continue current management. Mobilize.    LOS: 1 day   [facility] Special[facility] HospitalKRISHNAN,Tatsuo Musial  [facility] Pager [phone] 10/01/2015, 8:08 AM  If 7PM-7AM, please contact night-coverage at [email or web address], password [email or web address]

## 2015-10-02 DIAGNOSIS — E1165 Type 2 diabetes mellitus with hyperglycemia: Secondary | ICD-10-CM | POA: Diagnosis present

## 2015-10-02 LAB — GLUCOSE, CAPILLARY
Glucose-Capillary: 168 mg/dL — ABNORMAL HIGH (ref 65–99)
Glucose-Capillary: 165 mg/dL — ABNORMAL HIGH (ref 65–99)
Glucose-Capillary: 194 mg/dL — ABNORMAL HIGH (ref 65–99)
Glucose-Capillary: 143 mg/dL — ABNORMAL HIGH (ref 65–99)

## 2015-10-02 LAB — BASIC METABOLIC PANEL
Anion gap: 7 (ref 5–15)
BUN: 5 mg/dL — ABNORMAL LOW (ref 6–20)
CO2: 26 mmol/L (ref 22–32)
Calcium: 8.4 mg/dL — ABNORMAL LOW (ref 8.9–10.3)
Chloride: 102 mmol/L (ref 101–111)
Creatinine, Ser: 0.75 mg/dL (ref 0.61–1.24)
GFR calc Af Amer: >60 mL/min (ref >60)
GFR calc non Af Amer: >60 mL/min (ref >60)
Glucose, Bld: 160 mg/dL — ABNORMAL HIGH (ref 65–99)
Potassium: 3.8 mmol/L (ref 3.5–5.1)
Sodium: 135 mmol/L (ref 135–145)

## 2015-10-02 LAB — VANCOMYCIN, TROUGH: Vancomycin Tr: 8 ug/mL — ABNORMAL LOW (ref 10.0–20.0)

## 2015-10-02 LAB — CBC
HCT: 38.5 % — ABNORMAL LOW (ref 39.0–52.0)
Hemoglobin: 13.4 g/dL (ref 13.0–17.0)
MCH: 30.7 pg (ref 26.0–34.0)
MCHC: 34.8 g/dL (ref 30.0–36.0)
MCV: 88.3 fL (ref 78.0–100.0)
Platelets: 193 10*3/uL (ref 150–400)
RBC: 4.36 MIL/uL (ref 4.22–5.81)
RDW: 12.1 % (ref 11.5–15.5)
WBC: 7.3 10*3/uL (ref 4.0–10.5)

## 2015-10-02 MED ORDER — VANCOMYCIN HCL 10 G IV SOLR
1250.0000 mg | Freq: Three times a day (TID) | INTRAVENOUS | Status: DC
  Administered 2015-10-02 – 2015-10-04 (×5): 1250 mg via INTRAVENOUS
  Filled 2015-10-02 (×7): qty 1250

## 2015-10-02 MED ORDER — ENOXAPARIN SODIUM 80 MG/0.8ML ~~LOC~~ SOLN
65.0000 mg | SUBCUTANEOUS | Status: DC
  Administered 2015-10-02 – 2015-10-03 (×2): 65 mg via SUBCUTANEOUS
  Filled 2015-10-02 (×2): qty 0.8

## 2015-10-02 MED ORDER — VANCOMYCIN HCL 10 G IV SOLR
1250.0000 mg | INTRAVENOUS | Status: AC
  Administered 2015-10-02: 1250 mg via INTRAVENOUS
  Filled 2015-10-02: qty 1250

## 2015-10-02 NOTE — Progress Notes (Signed)
Inpatient Diabetes Program Recommendations  AACE/ADA: New Consensus Statement on Inpatient Glycemic Control (2015)  Target Ranges:  Prepandial:   less than 140 mg/dL      Peak postprandial:   less than 180 mg/dL (1-2 hours)      Critically ill patients:  140 - 180 mg/dL   Review of Glycemic Control  Diabetes history: DM 2 Outpatient Diabetes medications: Glipizide 5 mg bid, metformin 500 mg bid (Med Rec states pt has not had PCP- Has not been taking his dm meds. Current orders for Inpatient glycemic control: lantus 12 units at HS, Glipizide 5 mg bid and sensitive correction tidwc.  Inpatient Diabetes Program Recommendations: Noted patient is on renal/cho modified diet. (Do not see documentation of renal concerns) Better glucose control with lantus added to regimen-fasting cbg's are improved. Glucose is elevated after eating. Please consider using low dose meal coverage of 3 units novolog tidwc in addition to the sensitive correction tidwc.  Noted in H & P intention to order HgbA1C-please order.   Thank you Lenor CoffinAnn Breeona Waid, RN, MSN, CDE  Diabetes Inpatient Program Office: [phone] Pager: [phone] 8:00 am to 5:00 pm

## 2015-10-02 NOTE — Progress Notes (Signed)
[facility] PROGRESS NOTE  Angelina PihWilliam Milby ZOX:[id number] DOB: 06/15/1976 DOA: 09/30/2015  PCP: He has not had a PCP in many months. Would like to reestablish care with an outpatient provider.  Brief HPI: 39 year old African-American male with a past medical history of diabetes, hypertension, anxiety who presented with the right facial swelling ongoing for 2 days. Patient presented to the emergency department. He underwent CT scan of the area of concern. There was no focal fluid collection. Findings suggestive of cellulitis was noted. Patient was admitted for further management including IV antibiotics.  Past medical history:  Past Medical History  Diagnosis Date  . Diabetes mellitus   . Hypertension   . Anxiety     Consultants: Admitting physician discussed the case with Dr. Jearld FentonByers.  Procedures: None  Antibiotics: Vancomycin and Zosyn 11/19  Subjective: Patient Feels about the same. Swelling hasn't increased or improved. Denies any difficulty swallowing.  Objective: Vital Signs  Filed Vitals:   10/01/15 0526 10/01/15 1410 10/01/15 2115 10/02/15 0634  BP: 150/95 145/94 172/88 153/86  Pulse: 101 88 84 87  Temp: 98.9 F (37.2 C) 98.6 F (37 C) 98.5 F (36.9 C) 98.2 F (36.8 C)  TempSrc:  Oral Oral Oral  Resp: 16 18 18 16   SpO2: 94% 100% 95% 98%    Intake/Output Summary (Last 24 hours) at 10/02/15 0809 Last data filed at 10/01/15 1400  Gross per 24 hour  Intake    360 ml  Output      0 ml  Net    360 ml   There were no vitals filed for this visit.  General appearance: alert, cooperative, appears stated age and no distress HEENT: Right facial swelling is appreciated. Also noted around right orbit. Scab-like lesion noted in the right cheek has given way to a shallow ulcer without any drainage. Induration present. No fluctuation. No obvious concerning abnormalities in the nostrils. Nor in the oral cavity. Resp: clear to auscultation bilaterally Cardio: regular  rate and rhythm, S1, S2 normal, no murmur, click, rub or gallop GI: soft, non-tender; bowel sounds normal; no masses,  no organomegaly Neurologic: No focal deficits.  Lab Results:  Basic Metabolic Panel:  Recent Labs Lab 09/30/15 1507 09/30/15 1531 10/01/15 0220 10/02/15 0310  NA 134* 136 136 135  K 3.8 3.7 3.6 3.8  CL 100* 98* 104 102  CO2 26  --  26 26  GLUCOSE 335* 340* 97 160*  BUN 8 8 8  5*  CREATININE 0.94 0.90 0.91 0.75  CALCIUM 9.0  --  8.6* 8.4*   CBC:  Recent Labs Lab 09/30/15 1507 09/30/15 1531 10/01/15 0220 10/02/15 0310  WBC 8.6  --  8.5 7.3  NEUTROABS 6.1  --   --   --   HGB 15.6 17.7* 14.0 13.4  HCT 45.9 52.0 41.0 38.5*  MCV 89.3  --  89.1 88.3  PLT 202  --  207 193   CBG:  Recent Labs Lab 10/01/15 0630 10/01/15 1136 10/01/15 1707 10/01/15 2118 10/02/15 0639  GLUCAP 130* 231* 234* 202* 168*    Recent Results (from the past 240 hour(s))  Blood culture (routine x 2)     Status: None (Preliminary result)   Collection Time: 09/30/15  3:15 PM  Result Value Ref Range Status   Specimen Description BLOOD RIGHT ANTECUBITAL  Final   Special Requests BOTTLES DRAWN AEROBIC AND ANAEROBIC 10CC  Final   Culture NO GROWTH < 24 HOURS  Final   Report Status PENDING  Incomplete  Blood culture (routine x 2)     Status: None (Preliminary result)   Collection Time: 09/30/15  3:23 PM  Result Value Ref Range Status   Specimen Description BLOOD RIGHT HAND  Final   Special Requests BOTTLES DRAWN AEROBIC ONLY 10CC  Final   Culture NO GROWTH < 24 HOURS  Final   Report Status PENDING  Incomplete      Studies/Results: Ct Soft Tissue Neck W Contrast  09/30/2015  CLINICAL DATA:  Pain and swelling in the right trauma for 2 days. Difficulty swallowing. EXAM: CT NECK WITH CONTRAST TECHNIQUE: Multidetector CT imaging of the neck was performed using the standard protocol following the bolus administration of intravenous contrast. CONTRAST:  75mL OMNIPAQUE IOHEXOL 300  MG/ML  SOLN COMPARISON:  None. FINDINGS: Pharynx and larynx: There is symmetric prominent thickening of the posterior nasopharyngeal soft tissues up to 2.0 cm thickness, which occludes the nasopharyngeal airway. There is symmetric mild hypertrophy of the palatine tonsils. There is mild symmetric soft tissue thickening at the base of the tongue. The epiglottis appears within normal limits. No retropharyngeal or peritonsillar fluid collections. Salivary glands: No mass, duct dilation or side cholelithiasis is seen in the parotids or submandibular glands. Thyroid: Within normal limits. Lymph nodes: There is relatively symmetric mild-to-moderate lymphadenopathy involving the bilateral levels 1a, 1b, 2, 3 and 4 cervical nodal chains. A representative right level 1A node measures 1.8 cm (series 3/image 56) . Vascular: Within normal limits. Limited intracranial: No acute abnormality. Visualized orbits: No intraconal soft tissue abnormality. Mastoids and visualized paranasal sinuses: Non-opacified mastoids. 12 mm mucous retention cyst versus polyp in the medial left maxillary sinus. Skeleton: No aggressive appearing focal osseous lesions. There is a prominent dental carie in the posterior-most right maxillary molar (series 3/image 29). No periapical lucencies or bone erosions are visualized. There is prominent subcutaneous fat stranding and edema and overlying skin thickening in the right premaxillary and pre-mandibular soft tissues, with no focal fluid collection. Upper chest: No acute consolidative airspace disease or significant pulmonary nodules in the visualized lung apices. IMPRESSION: 1. Nonspecific prominent subcutaneous fat stranding and edema and overlying skin thickening in the right premaxillary and pre-mandibular superficial soft tissues, with no focal fluid collection, most suggestive of cellulitis. 2. Large dental carie in the posterior-most right maxillary molar, with no periapical lucencies or bone  erosions. 3. Relatively symmetric mild-to-moderate cervical lymphadenopathy as described, nonspecific. 4. Prominent symmetric thickening of the posterior nasopharyngeal soft tissues, which occludes the nasopharyngeal airway. Symmetric mild hypertrophy of the palatine tonsils. Mild symmetric soft tissue thickening at the base of the tongue. These findings are nonspecific and could be reactive, however lymphoma cannot be excluded. If symptoms do not improve on antibiotic therapy, ENT consultation and tissue sampling should be considered. Electronically Signed   By: Delbert Phenix M.D.   On: 09/30/2015 16:46    Medications:  Scheduled: . antiseptic oral rinse  7 mL Mouth Rinse q12n4p  . chlorhexidine  15 mL Mouth Rinse BID  . enoxaparin (LOVENOX) injection  40 mg Subcutaneous Q24H  . glipiZIDE  5 mg Oral BID AC  . insulin aspart  0-9 Units Subcutaneous TID WC  . insulin glargine  12 Units Subcutaneous QHS  . irbesartan  75 mg Oral Daily  . modafinil  200 mg Oral q morning - 10a  . PARoxetine  40 mg Oral Daily  . piperacillin-tazobactam (ZOSYN)  IV  3.375 g Intravenous Q8H  . polyethylene glycol  17 g Oral Daily  . vancomycin  1,000 mg Intravenous Q8H   Continuous: . sodium chloride 100 mL/hr at 09/30/15 2124   ONG:EXBMWUXLKGMWN **OR** acetaminophen, albuterol, amphetamine-dextroamphetamine, guaiFENesin-dextromethorphan, hydrALAZINE, LORazepam, morphine injection, ondansetron **OR** ondansetron (ZOFRAN) IV, oxyCODONE  Assessment/Plan:  Principal Problem:   Facial cellulitis Active Problems:   Anxiety   HTN (hypertension)   DM (diabetes mellitus), type 2, uncontrolled (HCC)    Facial cellulitis This appears to be a soft tissue infection without involvement of any sinuses or orbits. Patient's swelling appears to be stable. No worse. Continue vancomycin and Zosyn. Await chemical improvement. If there is no appreciable improvement by tomorrow, we will involve ENT. Case was discussed with ENT  on call Dr. Jearld Fenton over the phone the time of admission. Warm compresses.  Type 2 DM (diabetes mellitus), Uncontrolled Continue on Lantus. CBGs are improved. Continue to monitor. HbA1c is pending. Patient hasn't taken his oral medications consistently over the past many months. He hasn't followed up with any provider in the outpatient setting. Patient does have insurance. He will need to be provided with a list of outpatient providers.  Anxiety Continue as needed Lorazepam  Essential HTN (hypertension) Continue Avapro. Adjust dose tomorrow if blood pressure remains poorly controlled.  Prominent symmetric thickening of the posterior nasopharyngeal soft tissue Apparently obstructs the nasopharyngeal airway. Probably not related to his presenting illness. Can be followed up as outpatient.  Large dental carie in the posterior-most right maxillary molar,with no periapical lucencies or bone erosions Outpatient dental evaluation   DVT Prophylaxis: Lovenox    Code Status: Full code  Family Communication: Discussed with the patient  Disposition Plan: Continue current management. Mobilize.    LOS: 2 days   [facility]  [facility] Pager [phone] 10/02/2015, 8:09 AM  If 7PM-7AM, please contact night-coverage at [email or web address], password [facility]

## 2015-10-02 NOTE — Clinical Documentation Improvement (Signed)
Internal Medicine  Please identify the diabetic complications you state the patient has and document response in next progress note; not in the BPA drop down box.   Type 2 DM with ____________  Other  Clinically Undetermined  Supporting Information:  "DM (diabetes mellitus), secondary, with complications (HCC)"  Please exercise your independent, professional judgment when responding. A specific answer is not anticipated or expected.  Thank You, Shellee MiloEileen T Arhum Peeples RN, BSN Health Information Management [place] [phone]: Cell: [phone]

## 2015-10-02 NOTE — Progress Notes (Signed)
Pharmacy Antibiotic Time-Out Note  Angelina PihWilliam Minnehan is a 39 y.o. year-old male admitted on 09/30/2015.  The patient is currently on Vancomycin + Zosyn for facial cellulitis/abscess.  Assessment/Plan: A Vancomycin trough this morning resulted as SUBtherapeutic (8 mcg/ml, goal of 10-15 mcg/ml). Renal function is stable  1. Adjust Vancomycin to 1250 mg IV every 8 hours 2. Will continue to follow renal function, culture results, LOT, and antibiotic de-escalation plans   Temp (24hrs), Avg:98.4 F (36.9 C), Min:98.2 F (36.8 C), Max:98.5 F (36.9 C)   Recent Labs Lab 09/30/15 1507 10/01/15 0220 10/02/15 0310  WBC 8.6 8.5 7.3    Recent Labs Lab 09/30/15 1507 09/30/15 1531 10/01/15 0220 10/02/15 0310  CREATININE 0.94 0.90 0.91 0.75   CrCl cannot be calculated (Unknown ideal weight.).   Antimicrobial allergies: n/a  Antimicrobials this admission: 11/19 Clinda x1 11/19 Zosyn>> 11/19 Vanc>>  Levels/dose changes this admission: 11/21: VT 8 mcg/ml on 1g/8h >> adjust to 1250 mg/8h  Microbiology Results: 11/19 BCx >> ngtd  Thank you for allowing pharmacy to be a part of this patient's care.  Georgina PillionElizabeth Esgar Barnick, PharmD, BCPS Clinical Pharmacist Pager: [phone] 10/02/2015 2:27 PM

## 2015-10-03 ENCOUNTER — Inpatient Hospital Stay (HOSPITAL_COMMUNITY): Payer: 59

## 2015-10-03 LAB — CBC
HCT: 39.1 % (ref 39.0–52.0)
Hemoglobin: 13.6 g/dL (ref 13.0–17.0)
MCH: 30.7 pg (ref 26.0–34.0)
MCHC: 34.8 g/dL (ref 30.0–36.0)
MCV: 88.3 fL (ref 78.0–100.0)
Platelets: 196 10*3/uL (ref 150–400)
RBC: 4.43 MIL/uL (ref 4.22–5.81)
RDW: 12.4 % (ref 11.5–15.5)
WBC: 5.6 10*3/uL (ref 4.0–10.5)

## 2015-10-03 LAB — GLUCOSE, CAPILLARY
Glucose-Capillary: 117 mg/dL — ABNORMAL HIGH (ref 65–99)
Glucose-Capillary: 231 mg/dL — ABNORMAL HIGH (ref 65–99)
Glucose-Capillary: 120 mg/dL — ABNORMAL HIGH (ref 65–99)
Glucose-Capillary: 134 mg/dL — ABNORMAL HIGH (ref 65–99)

## 2015-10-03 LAB — BASIC METABOLIC PANEL
Anion gap: 7 (ref 5–15)
BUN: <5 mg/dL — ABNORMAL LOW (ref 6–20)
CO2: 25 mmol/L (ref 22–32)
Calcium: 8.5 mg/dL — ABNORMAL LOW (ref 8.9–10.3)
Chloride: 104 mmol/L (ref 101–111)
Creatinine, Ser: 0.88 mg/dL (ref 0.61–1.24)
GFR calc Af Amer: >60 mL/min (ref >60)
GFR calc non Af Amer: >60 mL/min (ref >60)
Glucose, Bld: 140 mg/dL — ABNORMAL HIGH (ref 65–99)
Potassium: 3.9 mmol/L (ref 3.5–5.1)
Sodium: 136 mmol/L (ref 135–145)

## 2015-10-03 LAB — HEMOGLOBIN A1C
Hgb A1c MFr Bld: 11.1 % — ABNORMAL HIGH (ref 4.8–5.6)
Mean Plasma Glucose: 272 mg/dL

## 2015-10-03 MED ORDER — IOHEXOL 300 MG/ML  SOLN
75.0000 mL | Freq: Once | INTRAMUSCULAR | Status: AC | PRN
  Administered 2015-10-03: 75 mL via INTRAVENOUS

## 2015-10-03 MED ORDER — LIVING WELL WITH DIABETES BOOK
Freq: Once | Status: AC
  Administered 2015-10-03: 11:00:00
  Filled 2015-10-03: qty 1

## 2015-10-03 NOTE — Care Management Note (Signed)
Case Management Note  Patient Details  Name: Angelina PihWilliam Schweizer MRN: [id number] Date of Birth: 05/26/1976  Subjective/Objective:           Admitted with facial cellulitis         Action/Plan: Gave patient a list of physicians who are taking new patients and made sure patient knows how to check with his insurance to verify whether doctors are in network. Patient stated that he will make appointment with a PCP.   Expected Discharge Date:  10/02/15               Expected Discharge Plan:  Home/Self Care  In-House Referral:  NA  Discharge planning Services     Post Acute Care Choice:    Choice offered to:     DME Arranged:    DME Agency:     HH Arranged:    HH Agency:     Status of Service:  In process, will continue to follow  Medicare Important Message Given:    Date Medicare IM Given:    Medicare IM give by:    Date Additional Medicare IM Given:    Additional Medicare Important Message give by:     If discussed at Long Length of Stay Meetings, dates discussed:    Additional Comments:  Monica BectonKrieg, Abdulhadi Stopa Watson, RN 10/03/2015, 3:34 PM

## 2015-10-03 NOTE — Consult Note (Signed)
Reason for Consult: Facial cellulitis  HPI:  Dwayne Rocha is an 39 y.o. male who was admitted on 09/30/15 for treatment of his facial cellulitis. He has a history of diabetes, hypertension, and anxiety. He presented to [facility] ER on 09/30/15 complaining of some right mid facial swelling. He noted the swelling had increased in size over the previous day. CT scan of the soft tissue showed cellulitis without any focal fluid collection. Pt was admitted for IV abx treatment.   Past Medical History  Diagnosis Date  . Diabetes mellitus   . Hypertension   . Anxiety     Past Surgical History  Procedure Laterality Date  . Hip surgery      "pins in hips"-"growth plate slipped"  . Wrist surgery      No family history on file.  Social History:  reports that he has been smoking Cigarettes.  He has been smoking about 0.50 packs per day. He does not have any smokeless tobacco history on file. He reports that he does not drink alcohol or use illicit drugs.  Allergies: No Known Allergies  Prior to Admission medications   Medication Sig Start Date End Date Taking? 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  09/18/15  Yes Historical Provider, MD  Armodafinil (NUVIGIL) 250 MG tablet Take 250 mg by mouth See admin instructions. Take 1 tablet (250 mg) by mouth at beginning of work day (varies according to shift scheduled)   Yes Historical Provider, MD  diphenhydrAMINE (BENADRYL) 25 MG tablet Take 25 mg by mouth every 6 (six) hours as needed (swelling).   Yes Historical Provider, MD  glipiZIDE (GLUCOTROL) 5 MG tablet Take 1 tablet (5 mg total) by mouth 2 (two) times daily. Patient taking differently: Take 5 mg by mouth daily before breakfast.  05/08/15 05/07/16 Yes Earleen Newport, MD  ibuprofen (ADVIL,MOTRIN) 200 MG tablet Take 800 mg by mouth every 6 (six) hours as needed (pain).   Yes Historical Provider, MD   LORazepam (ATIVAN) 1 MG tablet Take 1 tablet (1 mg total) by mouth 3 (three) times daily as needed for anxiety. Patient taking differently: Take 1 mg by mouth 3 (three) times daily as needed (panic attacks).  06/18/13  Yes Tanna Furry, MD  metFORMIN (GLUCOPHAGE) 1000 MG tablet Take 1 tablet (1,000 mg total) by mouth 2 (two) times daily with a meal. 05/08/15 05/07/16 Yes Earleen Newport, MD  PARoxetine (PAXIL) 40 MG tablet Take 40 mg by mouth daily.   Yes Historical Provider, MD    Medications:  I have reviewed the patient's current medications. Scheduled: . antiseptic oral rinse  7 mL Mouth Rinse q12n4p  . chlorhexidine  15 mL Mouth Rinse BID  . enoxaparin (LOVENOX) injection  65 mg Subcutaneous Q24H  . glipiZIDE  5 mg Oral BID AC  . insulin aspart  0-9 Units Subcutaneous TID WC  . insulin glargine  12 Units Subcutaneous QHS  . irbesartan  75 mg Oral Daily  . living well with diabetes book   Does not apply Once  . modafinil  200 mg Oral q morning - 10a  . PARoxetine  40 mg Oral Daily  . piperacillin-tazobactam (ZOSYN)  IV  3.375 g Intravenous Q8H  . polyethylene glycol  17 g Oral Daily  . vancomycin  1,250 mg Intravenous Q8H   OMV:[id number] **OR** acetaminophen, albuterol, amphetamine-dextroamphetamine, guaiFENesin-dextromethorphan, hydrALAZINE, LORazepam, morphine injection, ondansetron **OR** ondansetron (ZOFRAN) IV, oxyCODONE  Results  for orders placed or performed during the hospital encounter of 09/30/15 (from the past 48 hour(s))  Glucose, capillary     Status: Abnormal   Collection Time: 10/01/15  5:07 PM  Result Value Ref Range   Glucose-Capillary 234 (H) 65 - 99 mg/dL  Glucose, capillary     Status: Abnormal   Collection Time: 10/01/15  9:18 PM  Result Value Ref Range   Glucose-Capillary 202 (H) 65 - 99 mg/dL  Hemoglobin A1c     Status: Abnormal   Collection Time: 10/02/15  3:10 AM  Result Value Ref Range   Hgb A1c MFr Bld 11.1 (H) 4.8 - 5.6 %    Comment: (NOTE)          Pre-diabetes: 5.7 - 6.4         Diabetes: >6.4         Glycemic control for adults with diabetes: <7.0    Mean Plasma Glucose 272 mg/dL    Comment: (NOTE) Performed At: [facility] [place], [place] [id number] Lindon Romp MD GT:[id number]   CBC     Status: Abnormal   Collection Time: 10/02/15  3:10 AM  Result Value Ref Range   WBC 7.3 4.0 - 10.5 K/uL   RBC 4.36 4.22 - 5.81 MIL/uL   Hemoglobin 13.4 13.0 - 17.0 g/dL   HCT 38.5 (L) 39.0 - 52.0 %   MCV 88.3 78.0 - 100.0 fL   MCH 30.7 26.0 - 34.0 pg   MCHC 34.8 30.0 - 36.0 g/dL   RDW 12.1 11.5 - 15.5 %   Platelets 193 150 - 400 K/uL  Basic metabolic panel     Status: Abnormal   Collection Time: 10/02/15  3:10 AM  Result Value Ref Range   Sodium 135 135 - 145 mmol/L   Potassium 3.8 3.5 - 5.1 mmol/L   Chloride 102 101 - 111 mmol/L   CO2 26 22 - 32 mmol/L   Glucose, Bld 160 (H) 65 - 99 mg/dL   BUN 5 (L) 6 - 20 mg/dL   Creatinine, Ser 0.75 0.61 - 1.24 mg/dL   Calcium 8.4 (L) 8.9 - 10.3 mg/dL   GFR calc non Af Amer >60 >60 mL/min   GFR calc Af Amer >60 >60 mL/min    Comment: (NOTE) The eGFR has been calculated using the CKD EPI equation. This calculation has not been validated in all clinical situations. eGFR's persistently <60 mL/min signify possible Chronic Kidney Disease.    Anion gap 7 5 - 15  Glucose, capillary     Status: Abnormal   Collection Time: 10/02/15  6:39 AM  Result Value Ref Range   Glucose-Capillary 168 (H) 65 - 99 mg/dL  Vancomycin, trough     Status: Abnormal   Collection Time: 10/02/15 10:08 AM  Result Value Ref Range   Vancomycin Tr 8 (L) 10.0 - 20.0 ug/mL  Glucose, capillary     Status: Abnormal   Collection Time: 10/02/15 11:05 AM  Result Value Ref Range   Glucose-Capillary 165 (H) 65 - 99 mg/dL   Comment 1 Notify RN   Glucose, capillary     Status: Abnormal   Collection Time: 10/02/15  4:04 PM  Result Value Ref Range   Glucose-Capillary 194 (H) 65 - 99 mg/dL    Comment 1 Notify RN   Glucose, capillary     Status: Abnormal   Collection Time: 10/02/15  9:27 PM  Result Value Ref Range   Glucose-Capillary 143 (H) 65 -  99 mg/dL  CBC     Status: None   Collection Time: 10/03/15  4:48 AM  Result Value Ref Range   WBC 5.6 4.0 - 10.5 K/uL   RBC 4.43 4.22 - 5.81 MIL/uL   Hemoglobin 13.6 13.0 - 17.0 g/dL   HCT 39.1 39.0 - 52.0 %   MCV 88.3 78.0 - 100.0 fL   MCH 30.7 26.0 - 34.0 pg   MCHC 34.8 30.0 - 36.0 g/dL   RDW 12.4 11.5 - 15.5 %   Platelets 196 150 - 400 K/uL  Basic metabolic panel     Status: Abnormal   Collection Time: 10/03/15  4:48 AM  Result Value Ref Range   Sodium 136 135 - 145 mmol/L   Potassium 3.9 3.5 - 5.1 mmol/L   Chloride 104 101 - 111 mmol/L   CO2 25 22 - 32 mmol/L   Glucose, Bld 140 (H) 65 - 99 mg/dL   BUN <5 (L) 6 - 20 mg/dL   Creatinine, Ser 0.88 0.61 - 1.24 mg/dL   Calcium 8.5 (L) 8.9 - 10.3 mg/dL   GFR calc non Af Amer >60 >60 mL/min   GFR calc Af Amer >60 >60 mL/min    Comment: (NOTE) The eGFR has been calculated using the CKD EPI equation. This calculation has not been validated in all clinical situations. eGFR's persistently <60 mL/min signify possible Chronic Kidney Disease.    Anion gap 7 5 - 15  Glucose, capillary     Status: Abnormal   Collection Time: 10/03/15  6:28 AM  Result Value Ref Range   Glucose-Capillary 117 (H) 65 - 99 mg/dL   Comment 1 QC Due   Glucose, capillary     Status: Abnormal   Collection Time: 10/03/15 11:37 AM  Result Value Ref Range   Glucose-Capillary 231 (H) 65 - 99 mg/dL   Comment 1 Notify RN     Ct Maxillofacial W/cm  10/03/2015  CLINICAL DATA:  Repeat CT scan to eval for abscess. Area of interest marked with radiographic marker as well as a marker to identify the anatomical Right side Hx HTN, DM EXAM: CT MAXILLOFACIAL WITH CONTRAST TECHNIQUE: Multidetector CT imaging of the maxillofacial structures was performed with intravenous contrast. Multiplanar CT image reconstructions  were also generated. A small metallic BB was placed on the right temple in order to reliably differentiate right from left. CONTRAST:  2m OMNIPAQUE IOHEXOL 300 MG/ML  SOLN COMPARISON:  09/30/2015 FINDINGS: The inflammatory changes noted previously, extending from the right inferior cheek across the right mandibular region, has improved. There is some ill-defined focal opacity in the subcutaneous soft tissues adjacent to the right maxilla alveolar ridge extending to the level of the mandible. There is no defined abscess. Skin thickening has also improved. There are no new areas of inflammation. Limited intracranial evaluation is normal. Normal globes and orbits. There are prominent submandibular/submental lymph nodes. There are no pathologically enlarged lymph nodes. Largest node is a right level 2 jugulodigastric node measuring 11 mm in short axis. Small mucous retention cysts in the left maxillary sinus is stable. Sinuses otherwise clear. Clear mastoid air cells and middle ear cavities. Visualized airway is widely patent. There is no periapical lucency to suggest a dental root abscess. IMPRESSION: 1. There has been significant improvement in the inflammatory changes seen over the right face on the prior CT. There are still residual inflammatory changes. 2. No evidence of an abscess. 3. No new abnormalities. Electronically Signed   By: DLajean Manes  M.D.   On: 10/03/2015 12:00   Review of Systems Constitutional: No weight loss, night sweats, Fevers, chills, fatigue. HEENT:No headaches, Dysphagia,Sore throat,  Cardio-vascular: nNo chest pain,Orthopnea, PND,lower extremity edema, anasarca, palpitations GI: No heartburn, indigestion, abdominal pain, nausea, vomiting, diarrhea, melena or hematochezia Resp:No shortness of breath, cough, hemoptysis,plueritic chest pain.  Skin: No rash or lesions. GU: No dysuria, change in color of urine, no urgency or frequency. No flank pain. Musculoskeletal: No  joint pain or swelling. No decreased range of motion. No back pain. Endocrine:No heat intolerance, no cold intolerance, no polyuria, no polydipsia Psych:No change in mood or affect. No depression or anxiety. No memory loss.  Blood pressure 137/87, pulse 75, temperature 98.2 F (36.8 C), temperature source Oral, resp. rate 16, SpO2 100 %. PHYSICAL EXAM: General appearance :Awake, alert, not in any distress. Speech Clear. Not toxic Looking Face:: Right mid facial swelling with mild swelling of his right upper and lower lip. In the right mid face area, there is a small opening that is draining a small amount of purulent material. Surrounding this opening that is approximately 4--5 cm indurated area without any fluctuation. ENT: His pupils are equal, round, reactive to light. Extraocular motion is intact. Examination of the ears shows normal auricles and external auditory canals bilaterally. Both tympanic membranes are intact.  Nasal examination shows normal mucosa, septum, turbinates. Oral cavity examination shows no mucosal lacerations. No significant trismus is noted.  The trachea is midline. The thyroid is not significantly enlarged.  Cranial nerves 2-12 are all grossly in tact. Chest:Good air entry bilaterally, no added sounds  Extremities: B/L Lower Ext shows no edema, both legs are warm to touch Neurology: Non focal Skin:No Rash  Assessment/Plan: Right facial cellulitis. Pt reports improvement in his facial pain today. CT today shows no obvious abscess. Continue IV abx. Consider switching to oral abx tomorrow.  Rihan Schueler,SUI W 10/03/2015, 12:36 PM

## 2015-10-03 NOTE — Plan of Care (Signed)
Problem: Food- and Nutrition-Related Knowledge Deficit (NB-1.1) Goal: Nutrition education Formal process to instruct or train a patient/client in a skill or to impart knowledge to help patients/clients voluntarily manage or modify food choices and eating behavior to maintain or improve health. Outcome: Completed/Met Date Met:  10/03/15  RD consulted for nutrition education regarding diabetes.     Lab Results  Component Value Date    HGBA1C 11.1* 10/02/2015    RD provided "Carbohydrate Counting for People with Diabetes" handout from the Academy of Nutrition and Dietetics. Discussed different food groups and their effects on blood sugar, emphasizing carbohydrate-containing foods. Provided list of carbohydrates and recommended serving sizes of common foods.  Discussed importance of controlled and consistent carbohydrate intake throughout the day. Provided examples of ways to balance meals/snacks and encouraged intake of high-fiber, whole grain complex carbohydrates. Pt reports he has been consuming a lot of regular sodas and energy drinks. Discussed diabetic friendly drink options and the importance to decrease consumption of sugar sweetened beverages. Teach back method used.  Expect good compliance.  Current diet order is renal/carbohydrate modified, patient is consuming approximately 75-100% of meals at this time. Recommend changing diet to carbohydrate modified. Labs and medications reviewed. No further nutrition interventions warranted at this time. RD contact information provided. If additional nutrition issues arise, please re-consult RD.  Corrin Parker, MS, RD, LDN Pager # [phone] After hours/ weekend pager # [phone]

## 2015-10-03 NOTE — Progress Notes (Addendum)
Inpatient Diabetes Program Recommendations  AACE/ADA: New Consensus Statement on Inpatient Glycemic Control (2015)  Target Ranges:  Prepandial:   less than 140 mg/dL      Peak postprandial:   less than 180 mg/dL (1-2 hours)      Critically ill patients:  140 - 180 mg/dL   Review of Glycemic Control  Glucose  Well controlled using lantus at 12 units. Will patient be discharged on basal insulin or will he wait until he sees his PCP for other options or insulin start? Ordered education per RN to assist with watching TV system ed'l videos and dietician consult. Ordered diabetes education booklet as well. Will speak with patient to address concerns and/or plans. Requested Care manager consult to assess what insulin patient's insurance would cover and cost.  AD. Attempted to see patient, but he is sleeping soundly with loud music blaring by bedside. Also just reviewed care management's note. She gave patient a list of physicians who are taking new patients to that he needed to check with his insurance to find out if his insurance will cover.  When this has happened in the past, the patients often can't get an appt with any MD for several weeks. Would not be safe to send patient home on insulin without an MD to follow. Also I have no way of finding out what insulins his insurance will cover.  Will follow up in the am and talk with care manager to assist.  Thank you Lenor CoffinAnn Claudetta Sallie, RN, MSN, CDE  Diabetes Inpatient Program Office: [phone] Pager: [phone] 8:00 am to 5:00 pm

## 2015-10-03 NOTE — Progress Notes (Signed)
[facility] PROGRESS NOTE  Dwayne Rocha WUJ:[id number] DOB: Oct 22, 1976 DOA: 09/30/2015  PCP: He has not had a PCP in many months. Would like to reestablish care with an outpatient provider.  Brief HPI: 39 year old African-American male with a past medical history of diabetes, hypertension, anxiety who presented with the right facial swelling ongoing for 2 days. Patient presented to the emergency department. He underwent CT scan of the area of concern. There was no focal fluid collection. Findings suggestive of cellulitis was noted. Patient was admitted for further management including IV antibiotics.  Past medical history:  Past Medical History  Diagnosis Date  . Diabetes mellitus   . Hypertension   . Anxiety     Consultants: Admitting physician discussed the case with Dr. Jearld Fenton. Seen by Dr. Suszanne Conners (ENT)  Procedures: None  Antibiotics: Vancomycin and Zosyn 11/19  Subjective: Patient states that the pain is improved. But the swelling persists. Denies difficulty swallowing. Denies visual disturbances.   Objective: Vital Signs  Filed Vitals:   10/02/15 1300 10/02/15 2124 10/03/15 0624 10/03/15 0628  BP: 151/76 142/84 153/102 137/87  Pulse: 86 96 75   Temp: 98.1 F (36.7 C) 98.8 F (37.1 C) 98.2 F (36.8 C)   TempSrc: Oral Oral Oral   Resp: SpO2: 98% 98% 100%     Intake/Output Summary (Last 24 hours) at 10/03/15 9562 Last data filed at 10/02/15 1700  Gross per 24 hour  Intake    240 ml  Output      0 ml  Net    240 ml   There were no vitals filed for this visit.  General appearance: alert, cooperative, appears stated age and no distress HEENT: Right facial swelling is appreciated. Some improvement around the orbit on the right. Continues to have indurated hardened areas in the right cheek. Scab-like lesion noted in the right cheek has given way to a shallow ulcer without any drainage. No obvious concerning abnormalities in the nostrils. Nor in the  oral cavity. Resp: clear to auscultation bilaterally Cardio: regular rate and rhythm, S1, S2 normal, no murmur, click, rub or gallop GI: soft, non-tender; bowel sounds normal; no masses,  no organomegaly Neurologic: No focal deficits.  Lab Results:  Basic Metabolic Panel:  Recent Labs Lab 09/30/15 1507 09/30/15 1531 10/01/15 0220 10/02/15 0310 10/03/15 0448  NA 134* 136 136 135 136  K 3.8 3.7 3.6 3.8 3.9  CL 100* 98* 104 102 104  CO2 26  --  GLUCOSE 335* 340* 97 160* 140*  BUN 5* <5*  CREATININE 0.94 0.90 0.91 0.75 0.88  CALCIUM 9.0  --  8.6* 8.4* 8.5*   CBC:  Recent Labs Lab 09/30/15 1507 09/30/15 1531 10/01/15 0220 10/02/15 0310 10/03/15 0448  WBC 8.6  --  8.5 7.3 5.6  NEUTROABS 6.1  --   --   --   --   HGB 15.6 17.7* 14.0 13.4 13.6  HCT 45.9 52.0 41.0 38.5* 39.1  MCV 89.3  --  89.1 88.3 88.3  PLT 202  --  207 193 196   CBG:  Recent Labs Lab 10/01/15 2118 10/02/15 0639 10/02/15 1105 10/02/15 1604 10/02/15 2127  GLUCAP 202* 168* 165* 194* 143*    Recent Results (from the past 240 hour(s))  Blood culture (routine x 2)     Status: None (Preliminary result)   Collection Time: 09/30/15  3:15 PM  Result Value Ref Range Status   Specimen  Description BLOOD RIGHT ANTECUBITAL  Final   Special Requests BOTTLES DRAWN AEROBIC AND ANAEROBIC 10CC  Final   Culture NO GROWTH 2 DAYS  Final   Report Status PENDING  Incomplete  Blood culture (routine x 2)     Status: None (Preliminary result)   Collection Time: 09/30/15  3:23 PM  Result Value Ref Range Status   Specimen Description BLOOD RIGHT HAND  Final   Special Requests BOTTLES DRAWN AEROBIC ONLY 10CC  Final   Culture NO GROWTH 2 DAYS  Final   Report Status PENDING  Incomplete      Studies/Results: No results found.  Medications:  Scheduled: . antiseptic oral rinse  7 mL Mouth Rinse q12n4p  . chlorhexidine  15 mL Mouth Rinse BID  . enoxaparin (LOVENOX) injection  65 mg Subcutaneous Q24H   . glipiZIDE  5 mg Oral BID AC  . insulin aspart  0-9 Units Subcutaneous TID WC  . insulin glargine  12 Units Subcutaneous QHS  . irbesartan  75 mg Oral Daily  . modafinil  200 mg Oral q morning - 10a  . PARoxetine  40 mg Oral Daily  . piperacillin-tazobactam (ZOSYN)  IV  3.375 g Intravenous Q8H  . polyethylene glycol  17 g Oral Daily  . vancomycin  1,250 mg Intravenous Q8H   Continuous: . sodium chloride 50 mL/hr at 10/02/15 [id number]   RUE:[id number]:acetaminophen **OR** acetaminophen, albuterol, amphetamine-dextroamphetamine, guaiFENesin-dextromethorphan, hydrALAZINE, LORazepam, morphine injection, ondansetron **OR** ondansetron (ZOFRAN) IV, oxyCODONE  Assessment/Plan:  Principal Problem:   Facial cellulitis Active Problems:   Anxiety   HTN (hypertension)   DM (diabetes mellitus), type 2, uncontrolled (HCC)    Facial cellulitis This appears to be a soft tissue infection without involvement of any sinuses or orbits. Patient feels better today. However swelling persists. Perhaps a slight improvement. Discussed with Dr. Suszanne Connerseoh. Recommends a repeat CT scan. He will evaluate the patient as well. Continue IV antibiotics for now. Warm compresses.   Type 2 DM (diabetes mellitus), Uncontrolled Continue on Lantus. CBGs are improved. Continue to monitor. HbA1c is 11. Patient hasn't taken his oral medications consistently over the past many months. He hasn't followed up with any provider in the outpatient setting. Patient does have insurance. He will need to be provided with a list of outpatient providers. He will likely need to be discharged on Lantus.  Anxiety Continue as needed Lorazepam  Essential HTN (hypertension) Continue Avapro. Blood pressure is better this morning. Continue to monitor for now.  Prominent symmetric thickening of the posterior nasopharyngeal soft tissue Apparently obstructs the nasopharyngeal airway. Probably not related to his presenting illness. Can be followed up as  outpatient.  Large dental carie in the posterior-most right maxillary molar,with no periapical lucencies or bone erosions Outpatient dental evaluation   DVT Prophylaxis: Lovenox    Code Status: Full code  Family Communication: Discussed with the patient  Disposition Plan: Continue current management. Mobilize. Await repeat CT. Await ENT input.    LOS: 3 days   [facility] SpecialistsKRISHNAN,Maliki Gignac  [facility] Pager [phone] 10/03/2015, 8:33 AM  If 7PM-7AM, please contact night-coverage at [email or web address], password [email or web address]

## 2015-10-04 DIAGNOSIS — E1165 Type 2 diabetes mellitus with hyperglycemia: Secondary | ICD-10-CM

## 2015-10-04 LAB — GLUCOSE, CAPILLARY
Glucose-Capillary: 83 mg/dL (ref 65–99)
Glucose-Capillary: 130 mg/dL — ABNORMAL HIGH (ref 65–99)

## 2015-10-04 MED ORDER — INSULIN PEN NEEDLE 32G X 8 MM MISC: Status: DC

## 2015-10-04 MED ORDER — OXYCODONE HCL 5 MG PO TABS
5.0000 mg | ORAL_TABLET | Freq: Four times a day (QID) | ORAL | Status: DC | PRN
Start: 2015-10-04 — End: 2016-06-17

## 2015-10-04 MED ORDER — FREESTYLE SYSTEM KIT
1.0000 | PACK | Freq: Three times a day (TID) | Status: DC
Start: 2015-10-04 — End: 2016-06-17

## 2015-10-04 MED ORDER — INSULIN GLARGINE 100 UNIT/ML SOLOSTAR PEN: 12.0000 [IU] | PEN_INJECTOR | Freq: Every day | SUBCUTANEOUS | Status: DC

## 2015-10-04 MED ORDER — CLINDAMYCIN HCL 300 MG PO CAPS: 300.0000 mg | ORAL_CAPSULE | Freq: Four times a day (QID) | ORAL | Status: DC

## 2015-10-04 MED ORDER — FLUCONAZOLE 150 MG PO TABS: 150.0000 mg | ORAL_TABLET | Freq: Once | ORAL | Status: DC | PRN

## 2015-10-04 MED ORDER — GLIPIZIDE 5 MG PO TABS: 5.0000 mg | ORAL_TABLET | Freq: Two times a day (BID) | ORAL | Status: DC

## 2015-10-04 MED ORDER — INSULIN STARTER KIT- PEN NEEDLES (ENGLISH): 1.0000 | Status: DC

## 2015-10-04 NOTE — Progress Notes (Signed)
Subjective: Feeling better. Facial pain significantly improved.  Objective: Vital signs in last 24 hours: Temp:  [98.3 F (36.8 C)-98.8 F (37.1 C)] 98.6 F (37 C) (11/23 0540) Pulse Rate:  [74-96] 80 (11/23 0540) Resp:  [16] 16 (11/23 0540) BP: (141-179)/(77-99) 160/77 mmHg (11/23 0540) SpO2:  [100 %] 100 % (11/23 0540)  PHYSICAL EXAM: General appearance :Awake, alert, not in any distress. Speech Clear. Not toxic Looking Face: Mild right mid facial and submandibular swelling. In the right mid face area, there is a small opening that is draining a small amount of purulent material. The surrounding induration has decreased. ENT: His pupils are equal, round, reactive to light. Extraocular motion is intact. Examination of the ears shows normal auricles and external auditory canals bilaterally. Both tympanic membranes are intact.  Nasal examination shows normal mucosa, septum, turbinates. Oral cavity examination shows no mucosal lacerations. No significant trismus is noted.  The trachea is midline. The thyroid is not significantly enlarged.  Cranial nerves 2-12 are all grossly in tact. Chest:Good air entry bilaterally, no added sounds  Extremities: B/L Lower Ext shows no edema, both legs are warm to touch   Recent Labs  10/02/15 0310 10/03/15 0448  WBC 7.3 5.6  HGB 13.4 13.6  HCT 38.5* 39.1  PLT 193 196    Recent Labs  10/02/15 0310 10/03/15 0448  NA 135 136  K 3.8 3.9  CL 102 104  CO2 26 25  GLUCOSE 160* 140*  BUN 5* <5*  CREATININE 0.75 0.88  CALCIUM 8.4* 8.5*    Medications:  I have reviewed the patient's current medications. Scheduled: . antiseptic oral rinse  7 mL Mouth Rinse q12n4p  . chlorhexidine  15 mL Mouth Rinse BID  . enoxaparin (LOVENOX) injection  65 mg Subcutaneous Q24H  . glipiZIDE  5 mg Oral BID AC  . insulin aspart  0-9 Units Subcutaneous TID WC  . insulin glargine  12 Units Subcutaneous QHS  . irbesartan  75 mg Oral Daily  . modafinil   200 mg Oral q morning - 10a  . PARoxetine  40 mg Oral Daily  . piperacillin-tazobactam (ZOSYN)  IV  3.375 g Intravenous Q8H  . polyethylene glycol  17 g Oral Daily  . vancomycin  1,250 mg Intravenous Q8H   ZOX:[id number]:acetaminophen **OR** acetaminophen, albuterol, amphetamine-dextroamphetamine, guaiFENesin-dextromethorphan, hydrALAZINE, LORazepam, morphine injection, ondansetron **OR** ondansetron (ZOFRAN) IV, oxyCODONE  Assessment/Plan: Right facial cellulitis. Pt reports continuing improvement in his facial pain. CT yesterday showed no obvious abscess. Consider d/c home on oral clindamycin (e.g. 300mg  QID for 10 days). Pt may f/u in my office in 1 week.   LOS: 4 days   Chelsei Mcchesney,SUI W 10/04/2015, 8:09 AM

## 2015-10-04 NOTE — Progress Notes (Signed)
Patient was discharged home accompanied by his wife with no apparent distress noted. Discharge and medication instructions explained to the patient. He verbalized understanding. Copies given to him including original scripts.  He left around 12 noon.

## 2015-10-04 NOTE — Discharge Instructions (Signed)
Follow with Primary MD of your choice,along with Dr Suszanne Connerseoh (ENT) and a dentist of your choice (you have dental carries)    Please keep a record of your CBG's while on insulin/oral medications and take it to your Primary MD at your next appointment.  Please get a complete blood count and chemistry panel checked by your Primary MD at your next visit, and again as instructed by your Primary MD.  Get Medicines reviewed and adjusted. Please take all your medications with you for your next visit with your Primary MD  Please request your Primary MD to go over all hospital tests and procedure/radiological results at the follow up, please ask your Primary MD to get all Hospital records sent to his/her office.  If you experience worsening of your admission symptoms, develop shortness of breath, life threatening emergency, suicidal or homicidal thoughts you must seek medical attention immediately by calling 911 or calling your MD immediately  if symptoms less severe.  You must read complete instructions/literature along with all the possible adverse reactions/side effects for all the Medicines you take and that have been prescribed to you. Take any new Medicines after you have completely understood and accpet all the possible adverse reactions/side effects.   Do not drive when taking Pain medications or sleeping medications (Benzodaizepines)  Do not take more than prescribed Pain, Sleep and Anxiety Medications  Special Instructions: If you have smoked or chewed Tobacco  in the last 2 yrs please stop smoking, stop any regular Alcohol  and or any Recreational drug use.  Wear Seat belts while driving.  Please note  You were cared for by a hospitalist during your hospital stay. Once you are discharged, your primary care physician will handle any further medical issues. Please note that NO REFILLS for any discharge medications will be authorized once you are discharged, as it is imperative that you return to  your primary care physician (or establish a relationship with a primary care physician if you do not have one) for your aftercare needs so that they can reassess your need for medications and monitor your lab values.

## 2015-10-04 NOTE — Discharge Summary (Addendum)
PATIENT DETAILS Name: Dwayne Rocha Age: 39 y.o. Sex: male Date of Birth: May 05, 1976 MRN: [id number]. Admitting Physician: Jonetta Osgood, MD PCP:No PCP Per Patient  Admit Date: 09/30/2015 Discharge date: 10/04/2015  Recommendations for Outpatient Follow-up:  1. Please ensure follow up with Dentistry and ENT 2. Optimize Glycemic Control 3. Optimize anti-hypertensive regimen   PRIMARY DISCHARGE DIAGNOSIS:  Principal Problem:   Facial cellulitis Active Problems:   Anxiety   HTN (hypertension)   DM (diabetes mellitus), type 2, uncontrolled ([place])      PAST MEDICAL HISTORY: Past Medical History  Diagnosis Date  . Diabetes mellitus   . Hypertension   . Anxiety     DISCHARGE MEDICATIONS: Current Discharge Medication List    START taking these medications   Details  clindamycin (CLEOCIN) 300 MG capsule Take 1 capsule (300 mg total) by mouth 4 (four) times daily. Qty: 32 capsule, Refills: 0    glucose monitoring kit (FREESTYLE) monitoring kit 1 each by Does not apply route 4 (four) times daily - after meals and at bedtime. 1 month Diabetic Testing Supplies for QAC-QHS accuchecks. Qty: 1 each, Refills: 1    Insulin Glargine (LANTUS) 100 UNIT/ML Solostar Pen Inject 12 Units into the skin daily at 10 pm. Qty: 15 mL, Refills: 0    Insulin Pen Needle 32G X 8 MM MISC Use as directed Qty: 100 each, Refills: 0    oxyCODONE (OXY IR/ROXICODONE) 5 MG immediate release tablet Take 1 tablet (5 mg total) by mouth every 6 (six) hours as needed for moderate pain. Qty: 15 tablet, Refills: 0      CONTINUE these medications which have CHANGED   Details  glipiZIDE (GLUCOTROL) 5 MG tablet Take 1 tablet (5 mg total) by mouth 2 (two) times daily. 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  Refills: 0      Armodafinil (NUVIGIL) 250 MG tablet Take 250 mg by mouth See admin instructions. Take 1 tablet (250 mg) by mouth at beginning of work day (varies according to shift scheduled)    ibuprofen (ADVIL,MOTRIN) 200 MG tablet Take 800 mg by mouth every 6 (six) hours as needed (pain).    LORazepam (ATIVAN) 1 MG tablet Take 1 tablet (1 mg total) by mouth 3 (three) times daily as needed for anxiety. Qty: 15 tablet, Refills: 0    metFORMIN (GLUCOPHAGE) 1000 MG tablet Take 1 tablet (1,000 mg total) by mouth 2 (two) times daily with a meal. Qty: 60 tablet, Refills: 11    PARoxetine (PAXIL) 40 MG tablet Take 40 mg by mouth daily.      STOP taking these medications     diphenhydrAMINE (BENADRYL) 25 MG tablet         ALLERGIES:  No Known Allergies  BRIEF HPI:  See H&P, Labs, Consult and Test reports for all details in brief, patient was admitted for evaluation of right facial swelling  CONSULTATIONS:   ENT  PERTINENT RADIOLOGIC STUDIES: Ct Soft Tissue Neck W Contrast  09/30/2015  CLINICAL DATA:  Pain and swelling in the right trauma for 2 days. Difficulty swallowing. EXAM: CT NECK WITH CONTRAST TECHNIQUE: Multidetector CT imaging of the neck was performed using the standard protocol following the bolus administration of intravenous contrast. CONTRAST:  52m OMNIPAQUE IOHEXOL 300 MG/ML  SOLN COMPARISON:  None. FINDINGS: Pharynx and larynx:  There is symmetric prominent thickening of the posterior nasopharyngeal soft tissues up to 2.0 cm thickness, which occludes the nasopharyngeal airway. There is symmetric mild hypertrophy of the palatine tonsils. There is mild symmetric soft tissue thickening at the base of the tongue. The epiglottis appears within normal limits. No retropharyngeal or peritonsillar fluid collections. Salivary glands: No mass, duct dilation or side cholelithiasis is seen in the parotids or submandibular glands. Thyroid: Within normal limits. Lymph nodes: There is relatively symmetric  mild-to-moderate lymphadenopathy involving the bilateral levels 1a, 1b, 2, 3 and 4 cervical nodal chains. A representative right level 1A node measures 1.8 cm (series 3/image 56) . Vascular: Within normal limits. Limited intracranial: No acute abnormality. Visualized orbits: No intraconal soft tissue abnormality. Mastoids and visualized paranasal sinuses: Non-opacified mastoids. 12 mm mucous retention cyst versus polyp in the medial left maxillary sinus. Skeleton: No aggressive appearing focal osseous lesions. There is a prominent dental carie in the posterior-most right maxillary molar (series 3/image 29). No periapical lucencies or bone erosions are visualized. There is prominent subcutaneous fat stranding and edema and overlying skin thickening in the right premaxillary and pre-mandibular soft tissues, with no focal fluid collection. Upper chest: No acute consolidative airspace disease or significant pulmonary nodules in the visualized lung apices. IMPRESSION: 1. Nonspecific prominent subcutaneous fat stranding and edema and overlying skin thickening in the right premaxillary and pre-mandibular superficial soft tissues, with no focal fluid collection, most suggestive of cellulitis. 2. Large dental carie in the posterior-most right maxillary molar, with no periapical lucencies or bone erosions. 3. Relatively symmetric mild-to-moderate cervical lymphadenopathy as described, nonspecific. 4. Prominent symmetric thickening of the posterior nasopharyngeal soft tissues, which occludes the nasopharyngeal airway. Symmetric mild hypertrophy of the palatine tonsils. Mild symmetric soft tissue thickening at the base of the tongue. These findings are nonspecific and could be reactive, however lymphoma cannot be excluded. If symptoms do not improve on antibiotic therapy, ENT consultation and tissue sampling should be considered. Electronically Signed   By: Ilona Sorrel M.D.   On: 09/30/2015 16:46   Ct Maxillofacial  W/cm  10/03/2015  CLINICAL DATA:  Repeat CT scan to eval for abscess. Area of interest marked with radiographic marker as well as a marker to identify the anatomical Right side Hx HTN, DM EXAM: CT MAXILLOFACIAL WITH CONTRAST TECHNIQUE: Multidetector CT imaging of the maxillofacial structures was performed with intravenous contrast. Multiplanar CT image reconstructions were also generated. A small metallic BB was placed on the right temple in order to reliably differentiate right from left. CONTRAST:  43m OMNIPAQUE IOHEXOL 300 MG/ML  SOLN COMPARISON:  09/30/2015 FINDINGS: The inflammatory changes noted previously, extending from the right inferior cheek across the right mandibular region, has improved. There is some ill-defined focal opacity in the subcutaneous soft tissues adjacent to the right maxilla alveolar ridge extending to the level of the mandible. There is no defined abscess. Skin thickening has also improved. There are no new areas of inflammation. Limited intracranial evaluation is normal. Normal globes and orbits. There are prominent submandibular/submental lymph nodes. There are no pathologically enlarged lymph nodes. Largest node is a right level 2 jugulodigastric node measuring 11 mm in short axis. Small mucous retention cysts in the left maxillary sinus is stable. Sinuses otherwise clear. Clear mastoid air cells and middle ear cavities. Visualized airway is widely patent. There is no periapical lucency to suggest a dental root abscess. IMPRESSION: 1. There has been significant improvement in the inflammatory changes seen over the right face on the  prior CT. There are still residual inflammatory changes. 2. No evidence of an abscess. 3. No new abnormalities. Electronically Signed   By: Lajean Manes M.D.   On: 10/03/2015 12:00     PERTINENT LAB RESULTS: CBC:  Recent Labs  10/02/15 0310 10/03/15 0448  WBC 7.3 5.6  HGB 13.4 13.6  HCT 38.5* 39.1  PLT 193 196   CMET CMP     Component  Value Date/Time   NA 136 10/03/2015 0448   K 3.9 10/03/2015 0448   CL 104 10/03/2015 0448   CO2 25 10/03/2015 0448   GLUCOSE 140* 10/03/2015 0448   BUN <5* 10/03/2015 0448   CREATININE 0.88 10/03/2015 0448   CALCIUM 8.5* 10/03/2015 0448   PROT 7.4 05/08/2015 1853   ALBUMIN 3.5 05/08/2015 1853   AST 21 05/08/2015 1853   ALT 25 05/08/2015 1853   ALKPHOS 131* 05/08/2015 1853   BILITOT 0.3 05/08/2015 1853   GFRNONAA >60 10/03/2015 0448   GFRAA >60 10/03/2015 0448    GFR CrCl cannot be calculated (Unknown ideal weight.). No results for input(s): LIPASE, AMYLASE in the last 72 hours. No results for input(s): CKTOTAL, CKMB, CKMBINDEX, TROPONINI in the last 72 hours. Invalid input(s): POCBNP No results for input(s): DDIMER in the last 72 hours.  Recent Labs  10/02/15 0310  HGBA1C 11.1*   No results for input(s): CHOL, HDL, LDLCALC, TRIG, CHOLHDL, LDLDIRECT in the last 72 hours. No results for input(s): TSH, T4TOTAL, T3FREE, THYROIDAB in the last 72 hours.  Invalid input(s): FREET3 No results for input(s): VITAMINB12, FOLATE, FERRITIN, TIBC, IRON, RETICCTPCT in the last 72 hours. Coags: No results for input(s): INR in the last 72 hours.  Invalid input(s): PT Microbiology: Recent Results (from the past 240 hour(s))  Blood culture (routine x 2)     Status: None (Preliminary result)   Collection Time: 09/30/15  3:15 PM  Result Value Ref Range Status   Specimen Description BLOOD RIGHT ANTECUBITAL  Final   Special Requests BOTTLES DRAWN AEROBIC AND ANAEROBIC 10CC  Final   Culture NO GROWTH 3 DAYS  Final   Report Status PENDING  Incomplete  Blood culture (routine x 2)     Status: None (Preliminary result)   Collection Time: 09/30/15  3:23 PM  Result Value Ref Range Status   Specimen Description BLOOD RIGHT HAND  Final   Special Requests BOTTLES DRAWN AEROBIC ONLY 10CC  Final   Culture NO GROWTH 3 DAYS  Final   Report Status PENDING  Incomplete     BRIEF HOSPITAL COURSE:   Facial cellulitis:This appears to be a soft tissue infection without involvement of any sinuses or orbits. Patient feels with empiric Vanco/Zosyn-repeat CT Scan on 11/22 showed significant improvement without any abscess-when compared to CT scan on admission. Seen in consult by ENT-recommendations are to discharge with Clindamycin and to follow as outpatient. Compared to admission-sweeling and pain in the right facial area has significantly improved.  Type 2 DM (diabetes mellitus), Uncontrolled:Non compliant with oral hypoglycemics (takes intermittently)-A1C 11. Started on Lantus with much better glycemic control. Will discharge on Lantus along with Metformin and Glipizide. Note patient has taken Lantus in the past and is very familiar with it. Follow with PCP of his choice for further optimization.  Anxiety:Continue as needed Lorazepam  Essential HTN (hypertension):Controlled-Continue Avapro.  Prominent symmetric thickening of the posterior nasopharyngeal soft tissue:Apparently obstructs the nasopharyngeal airway. Probably not related to his presenting illness. Can be followed up as outpatient-have asked him to follow with  PCP/ENT  Large dental carie in the posterior-most right maxillary molar,with no periapical lucencies or bone erosions:Outpatient dental evaluation-have asked patient to call a dentist of his choice.   Non compliance:counseled regarding importance of tight glycemic control-he does not have a PCP-but claims that he will find one of his choosing for optimization of long term glycemic/antihypertensive control.   TODAY-DAY OF DISCHARGE:  Subjective:   Dwayne Rocha today has no headache,no chest abdominal pain,no new weakness tingling or numbness, feels much better wants to go home today.  Objective:   Blood pressure 160/77, pulse 80, temperature 98.6 F (37 C), temperature source Oral, resp. rate 16, SpO2 100 %.  Intake/Output Summary (Last 24 hours) at 10/04/15  6269 Last data filed at 10/04/15 0329  Gross per 24 hour  Intake    720 ml  Output      0 ml  Net    720 ml   There were no vitals filed for this visit.  Exam Awake Alert, Oriented *3, No new F.N deficits, Normal affect In the right mid face area, there is a small opening that is draining a minimal amount of serous discharge, the surrounding induration has decreased. [place].AT,PERRAL Supple Neck,No JVD, No cervical lymphadenopathy appriciated.  Symmetrical Chest wall movement, Good air movement bilaterally, CTAB RRR,No Gallops,Rubs or new Murmurs, No Parasternal Heave +ve B.Sounds, Abd Soft, Non tender, No organomegaly appriciated, No rebound -guarding or rigidity. No Cyanosis, Clubbing or edema, No new Rash or bruise  DISCHARGE CONDITION: Stable  DISPOSITION: Home  DISCHARGE INSTRUCTIONS:    Activity:  As tolerated   Follow with Primary MD of your choice,along with Dr Benjamine Mola (ENT) and a dentist of your choice (you have dental carries)    Please keep a record of your CBG's while on insulin/oral medications and take it to your Primary MD at your next appointment.  Please get a complete blood count and chemistry panel checked by your Primary MD at your next visit, and again as instructed by your Primary MD.  Get Medicines reviewed and adjusted: Please take all your medications with you for your next visit with your Primary MD  Please request your Primary MD to go over all hospital tests and procedure/radiological results at the follow up, please ask your Primary MD to get all Hospital records sent to his/her office.  If you experience worsening of your admission symptoms, develop shortness of breath, life threatening emergency, suicidal or homicidal thoughts you must seek medical attention immediately by calling 911 or calling your MD immediately  if symptoms less severe.  You must read complete instructions/literature along with all the possible adverse reactions/side effects for  all the Medicines you take and that have been prescribed to you. Take any new Medicines after you have completely understood and accpet all the possible adverse reactions/side effects.   Do not drive when taking Pain medications.   Do not take more than prescribed Pain, Sleep and Anxiety Medications  Special Instructions: If you have smoked or chewed Tobacco  in the last 2 yrs please stop smoking, stop any regular Alcohol  and or any Recreational drug use.  Wear Seat belts while driving.  Please note  You were cared for by a hospitalist during your hospital stay. Once you are discharged, your primary care physician will handle any further medical issues. Please note that NO REFILLS for any discharge medications will be authorized once you are discharged, as it is imperative that you return to your primary care physician (or  establish a relationship with a primary care physician if you do not have one) for your aftercare needs so that they can reassess your need for medications and monitor your lab values.  Diet recommendation: Diabetic Diet Heart Healthy diet   Follow-up Information    Follow up with Ascencion Dike, MD. Schedule an appointment as soon as possible for a visit in 1 week.   Specialty:  Otolaryngology   Why:  Hospital follow up-please call and make a appointment   Contact information:   [address] [place] [id number] [phone]       Schedule an appointment as soon as possible for a visit in 1 week to follow up.   Contact information:   Primary MD      Schedule an appointment as soon as possible for a visit in 1 week to follow up.   Contact information:   Dentist of your choice      Total Time spent on discharge equals 45 minutes.  SignedOren Binet 10/04/2015 9:27 AM

## 2015-10-04 NOTE — Care Management (Signed)
Per insurance benefits check patient does not have prescription coverage at this time. Per MD request case manager made follow-up appointment for patient at [facility]. Appointment is for Wednesday, October 11, 2015 at 10:00 am. CM explained to patient importance of taking his prescription for insulin there today to get filed and advised him to be sure to keep follow-up appointment.

## 2015-10-04 NOTE — Progress Notes (Addendum)
Inpatient Diabetes Program Recommendations  AACE/ADA: New Consensus Statement on Inpatient Glycemic Control (2015)  Target Ranges:  Prepandial:   less than 140 mg/dL      Peak postprandial:   less than 180 mg/dL (1-2 hours)      Critically ill patients:  140 - 180 mg/dL   Review of Glycemic Control  Noted care manager assessed lack of insurance benefits at this time for MD and medications Pt to follow-up with [facility]-to get medications through them. Will order insulin pen starter pen kit and will teach patient how to use the pen today. Pt to watch ed'l videos on insulin pen administration. Have RN's review signs and symptoms of hyper-and hypo glycemia as well. Written material to go home with patient.  AD: Requested education for pt leaving hospital using insulin pen and to check cbg's. Attempted to see patient at this time and am told that he was discharged-needing to get to the [facility] as it is closing early. Hopefully the clinic pharmacy will teach him how to use. Called clinic pharmacy and left message regarding patient's need for instruction on how to use the insulin pen and glucose meter.  Thank you Rosita Kea, RN, MSN, CDE  Diabetes Inpatient Program Office: [phone] Pager: [phone] 8:00 am to 5:00 pm

## 2015-10-05 LAB — CULTURE, BLOOD (ROUTINE X 2)
Culture: NO GROWTH
Culture: NO GROWTH

## 2015-10-11 ENCOUNTER — Inpatient Hospital Stay: Payer: Self-pay

## 2016-06-10 ENCOUNTER — Encounter (HOSPITAL_COMMUNITY): Payer: Self-pay | Admitting: *Deleted

## 2016-06-10 ENCOUNTER — Emergency Department (HOSPITAL_COMMUNITY)
Admission: EM | Admit: 2016-06-10 | Discharge: 2016-06-10 | Disposition: A | Discharge status: Other Acute Inpatient Hospital | Payer: 59 | Attending: Emergency Medicine | Admitting: Emergency Medicine

## 2016-06-10 ENCOUNTER — Encounter (HOSPITAL_COMMUNITY): Payer: Self-pay | Admitting: Emergency Medicine

## 2016-06-10 ENCOUNTER — Inpatient Hospital Stay (HOSPITAL_COMMUNITY)
Admission: AD | Admit: 2016-06-10 | Discharge: 2016-06-17 | DRG: 885 | Disposition: A | Discharge status: Home / Self Care | Payer: 59 | Source: Intra-hospital | Attending: Psychiatry | Admitting: Psychiatry

## 2016-06-10 DIAGNOSIS — F322 Major depressive disorder, single episode, severe without psychotic features: Secondary | ICD-10-CM | POA: Diagnosis present

## 2016-06-10 DIAGNOSIS — I10 Essential (primary) hypertension: Secondary | ICD-10-CM | POA: Insufficient documentation

## 2016-06-10 DIAGNOSIS — F332 Major depressive disorder, recurrent severe without psychotic features: Principal | ICD-10-CM | POA: Diagnosis present

## 2016-06-10 DIAGNOSIS — F17210 Nicotine dependence, cigarettes, uncomplicated: Secondary | ICD-10-CM | POA: Insufficient documentation

## 2016-06-10 DIAGNOSIS — F329 Major depressive disorder, single episode, unspecified: Secondary | ICD-10-CM | POA: Diagnosis present

## 2016-06-10 DIAGNOSIS — Z7984 Long term (current) use of oral hypoglycemic drugs: Secondary | ICD-10-CM | POA: Insufficient documentation

## 2016-06-10 DIAGNOSIS — R45851 Suicidal ideations: Secondary | ICD-10-CM

## 2016-06-10 DIAGNOSIS — E119 Type 2 diabetes mellitus without complications: Secondary | ICD-10-CM | POA: Insufficient documentation

## 2016-06-10 DIAGNOSIS — Z79899 Other long term (current) drug therapy: Secondary | ICD-10-CM | POA: Diagnosis not present

## 2016-06-10 DIAGNOSIS — Z794 Long term (current) use of insulin: Secondary | ICD-10-CM | POA: Diagnosis not present

## 2016-06-10 LAB — RAPID URINE DRUG SCREEN, HOSP PERFORMED
Amphetamines: NOT DETECTED
Barbiturates: NOT DETECTED
Benzodiazepines: NOT DETECTED
Cocaine: NOT DETECTED
Opiates: NOT DETECTED
Tetrahydrocannabinol: NOT DETECTED

## 2016-06-10 LAB — CBC
HCT: 44.8 % (ref 39.0–52.0)
Hemoglobin: 15.2 g/dL (ref 13.0–17.0)
MCH: 30.4 pg (ref 26.0–34.0)
MCHC: 33.9 g/dL (ref 30.0–36.0)
MCV: 89.6 fL (ref 78.0–100.0)
Platelets: 231 10*3/uL (ref 150–400)
RBC: 5 MIL/uL (ref 4.22–5.81)
RDW: 12.3 % (ref 11.5–15.5)
WBC: 7.5 10*3/uL (ref 4.0–10.5)

## 2016-06-10 LAB — COMPREHENSIVE METABOLIC PANEL
ALT: 28 U/L (ref 17–63)
AST: 20 U/L (ref 15–41)
Albumin: 3.8 g/dL (ref 3.5–5.0)
Alkaline Phosphatase: 110 U/L (ref 38–126)
Anion gap: 9 (ref 5–15)
BUN: 9 mg/dL (ref 6–20)
CO2: 23 mmol/L (ref 22–32)
Calcium: 9 mg/dL (ref 8.9–10.3)
Chloride: 100 mmol/L — ABNORMAL LOW (ref 101–111)
Creatinine, Ser: 1.01 mg/dL (ref 0.61–1.24)
GFR calc Af Amer: >60 mL/min (ref >60)
GFR calc non Af Amer: >60 mL/min (ref >60)
Glucose, Bld: 306 mg/dL — ABNORMAL HIGH (ref 65–99)
Potassium: 3.8 mmol/L (ref 3.5–5.1)
Sodium: 132 mmol/L — ABNORMAL LOW (ref 135–145)
Total Bilirubin: 0.9 mg/dL (ref 0.3–1.2)
Total Protein: 7 g/dL (ref 6.5–8.1)

## 2016-06-10 LAB — SALICYLATE LEVEL: Salicylate Lvl: <4 mg/dL (ref 2.8–30.0)

## 2016-06-10 LAB — GLUCOSE, CAPILLARY: Glucose-Capillary: 247 mg/dL — ABNORMAL HIGH (ref 65–99)

## 2016-06-10 LAB — ETHANOL: Alcohol, Ethyl (B): <5 mg/dL (ref <5)

## 2016-06-10 LAB — ACETAMINOPHEN LEVEL: Acetaminophen (Tylenol), Serum: <10 ug/mL — ABNORMAL LOW (ref 10–30)

## 2016-06-10 MED ORDER — ALUM & MAG HYDROXIDE-SIMETH 200-200-20 MG/5ML PO SUSP: 30.0000 mL | ORAL | Status: DC | PRN

## 2016-06-10 MED ORDER — GLIPIZIDE 5 MG PO TABS
5.0000 mg | ORAL_TABLET | Freq: Two times a day (BID) | ORAL | Status: DC
  Administered 2016-06-10 (×2): 5 mg via ORAL
  Filled 2016-06-10 (×4): qty 1

## 2016-06-10 MED ORDER — AMPHETAMINE-DEXTROAMPHETAMINE 10 MG PO TABS: 30.0000 mg | ORAL_TABLET | Freq: Every day | ORAL | Status: DC | PRN

## 2016-06-10 MED ORDER — PAROXETINE HCL 20 MG PO TABS
40.0000 mg | ORAL_TABLET | Freq: Every day | ORAL | Status: DC
  Administered 2016-06-11: 40 mg via ORAL
  Filled 2016-06-10 (×2): qty 2

## 2016-06-10 MED ORDER — INSULIN ASPART 100 UNIT/ML ~~LOC~~ SOLN
0.0000 [IU] | Freq: Three times a day (TID) | SUBCUTANEOUS | Status: DC
  Administered 2016-06-11: 7 [IU] via SUBCUTANEOUS
  Administered 2016-06-11 (×2): 4 [IU] via SUBCUTANEOUS
  Administered 2016-06-12: 3 [IU] via SUBCUTANEOUS
  Administered 2016-06-12: 4 [IU] via SUBCUTANEOUS
  Administered 2016-06-12: 7 [IU] via SUBCUTANEOUS
  Administered 2016-06-13: 11 [IU] via SUBCUTANEOUS
  Administered 2016-06-13 (×2): 4 [IU] via SUBCUTANEOUS
  Administered 2016-06-14: 3 [IU] via SUBCUTANEOUS
  Administered 2016-06-14 – 2016-06-15 (×3): 4 [IU] via SUBCUTANEOUS
  Administered 2016-06-15: 3 [IU] via SUBCUTANEOUS
  Administered 2016-06-15 – 2016-06-16 (×2): 4 [IU] via SUBCUTANEOUS
  Administered 2016-06-16 – 2016-06-17 (×2): 3 [IU] via SUBCUTANEOUS

## 2016-06-10 MED ORDER — ACETAMINOPHEN 325 MG PO TABS
650.0000 mg | ORAL_TABLET | ORAL | Status: DC | PRN
  Administered 2016-06-10: 650 mg via ORAL
  Filled 2016-06-10: qty 2

## 2016-06-10 MED ORDER — MODAFINIL 100 MG PO TABS
200.0000 mg | ORAL_TABLET | Freq: Every day | ORAL | Status: DC
  Administered 2016-06-10: 200 mg via ORAL
  Filled 2016-06-10: qty 2

## 2016-06-10 MED ORDER — INSULIN ASPART 100 UNIT/ML ~~LOC~~ SOLN
0.0000 [IU] | Freq: Every day | SUBCUTANEOUS | Status: DC
  Administered 2016-06-10 – 2016-06-15 (×2): 2 [IU] via SUBCUTANEOUS

## 2016-06-10 MED ORDER — NICOTINE 14 MG/24HR TD PT24
14.0000 mg | MEDICATED_PATCH | Freq: Once | TRANSDERMAL | Status: DC
  Filled 2016-06-10: qty 1

## 2016-06-10 MED ORDER — ONDANSETRON HCL 4 MG PO TABS: 4.0000 mg | ORAL_TABLET | Freq: Three times a day (TID) | ORAL | Status: DC | PRN

## 2016-06-10 MED ORDER — PAROXETINE HCL 20 MG PO TABS
40.0000 mg | ORAL_TABLET | Freq: Every day | ORAL | Status: DC
  Administered 2016-06-10: 40 mg via ORAL
  Filled 2016-06-10: qty 2

## 2016-06-10 MED ORDER — LORAZEPAM 1 MG PO TABS: 1.0000 mg | ORAL_TABLET | Freq: Three times a day (TID) | ORAL | Status: DC | PRN

## 2016-06-10 MED ORDER — INSULIN GLARGINE 100 UNIT/ML ~~LOC~~ SOLN
12.0000 [IU] | Freq: Every day | SUBCUTANEOUS | Status: DC
  Filled 2016-06-10: qty 0.12

## 2016-06-10 MED ORDER — MAGNESIUM HYDROXIDE 400 MG/5ML PO SUSP: 30.0000 mL | Freq: Every day | ORAL | Status: DC | PRN

## 2016-06-10 MED ORDER — ACETAMINOPHEN 325 MG PO TABS
650.0000 mg | ORAL_TABLET | Freq: Four times a day (QID) | ORAL | Status: DC | PRN
  Administered 2016-06-12: 650 mg via ORAL
  Filled 2016-06-10: qty 2

## 2016-06-10 MED ORDER — METFORMIN HCL 500 MG PO TABS
1000.0000 mg | ORAL_TABLET | Freq: Two times a day (BID) | ORAL | Status: DC
  Administered 2016-06-10 (×2): 1000 mg via ORAL
  Filled 2016-06-10 (×2): qty 2

## 2016-06-10 MED ORDER — IBUPROFEN 400 MG PO TABS: 600.0000 mg | ORAL_TABLET | Freq: Three times a day (TID) | ORAL | Status: DC | PRN

## 2016-06-10 MED ORDER — ZOLPIDEM TARTRATE 5 MG PO TABS: 5.0000 mg | ORAL_TABLET | Freq: Every evening | ORAL | Status: DC | PRN

## 2016-06-10 MED ORDER — TRAZODONE HCL 50 MG PO TABS
50.0000 mg | ORAL_TABLET | Freq: Every day | ORAL | Status: DC
  Administered 2016-06-10: 50 mg via ORAL
  Filled 2016-06-10 (×3): qty 1

## 2016-06-10 MED ORDER — METFORMIN HCL 500 MG PO TABS
1000.0000 mg | ORAL_TABLET | Freq: Two times a day (BID) | ORAL | Status: DC
  Administered 2016-06-11 – 2016-06-17 (×14): 1000 mg via ORAL
  Filled 2016-06-10 (×17): qty 2

## 2016-06-10 MED ORDER — INSULIN GLARGINE 100 UNIT/ML ~~LOC~~ SOLN
8.0000 [IU] | Freq: Every day | SUBCUTANEOUS | Status: DC
  Administered 2016-06-10 – 2016-06-16 (×7): 8 [IU] via SUBCUTANEOUS

## 2016-06-10 NOTE — ED Notes (Addendum)
Pt transported to [facility]. Dwayne Rocha- [facility]).

## 2016-06-10 NOTE — ED Notes (Signed)
Staffing office and charge nurse notified on pt.'s sitter , purple scrubs given to pt. , security notified to wand pt.

## 2016-06-10 NOTE — Progress Notes (Signed)
Patient accepted at [facility] to room 406-1 to Dr. Jama Flavors. Patient's RN has been informed. Call report at [phone].  Melbourne Abts, LCSWA Disposition staff 06/10/2016 8:21 PM

## 2016-06-10 NOTE — ED Notes (Signed)
Pt finished dinner. Laid back down.

## 2016-06-10 NOTE — ED Provider Notes (Signed)
[facility] DEPT Provider Note   CSN: [id number] Arrival date & time: 06/10/16  5465  First Provider Contact:  None       History   Chief Complaint Chief Complaint  Patient presents with  . Suicidal    HPI Dwayne Rocha is a 40 y.o. male.  The history is provided by the patient.  He has a history of anxiety disorder and depression. For the last week, he has had suicidal thoughts. He states that he has been under a lot of stress but would not elaborate. He has had thoughts of running his car into a tree or taking an overdose of pills. He admits to crying spells, early morning wakening, and anhedonia. He denies hallucinations. He denies ethanol or drug use. He has never been hospitalized for depression denies previous episodes of suicidal thoughts.  Past Medical History:  Diagnosis Date  . Anxiety   . Diabetes mellitus   . Hypertension     Patient Active Problem List   Diagnosis Date Noted  . DM (diabetes mellitus), type 2, uncontrolled ([place]) 10/02/2015  . Facial cellulitis 09/30/2015  . Anxiety 09/30/2015  . HTN (hypertension) 09/30/2015    Past Surgical History:  Procedure Laterality Date  . HIP SURGERY     "pins in hips"-"growth plate slipped"  . WRIST SURGERY         Home Medications    Prior to Admission medications   Medication Sig Start Date End Date Taking? 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  09/18/15   Historical Provider, MD  Armodafinil (NUVIGIL) 250 MG tablet Take 250 mg by mouth See admin instructions. Take 1 tablet (250 mg) by mouth at beginning of work day (varies according to shift scheduled)    Historical Provider, MD  clindamycin (CLEOCIN) 300 MG capsule Take 1 capsule (300 mg total) by mouth 4 (four) times daily. 10/04/15   Shanker Kristeen Mans, MD  fluconazole (DIFLUCAN) 150 MG tablet Take 1 tablet (150 mg total) by mouth once as needed. For  candidiasis 10/04/15   Shanker Kristeen Mans, MD  glipiZIDE (GLUCOTROL) 5 MG tablet Take 1 tablet (5 mg total) by mouth 2 (two) times daily. 10/04/15 10/03/16  Shanker Kristeen Mans, MD  glucose monitoring kit (FREESTYLE) monitoring kit 1 each by Does not apply route 4 (four) times daily - after meals and at bedtime. 1 month Diabetic Testing Supplies for QAC-QHS accuchecks. 10/04/15   Shanker Kristeen Mans, MD  ibuprofen (ADVIL,MOTRIN) 200 MG tablet Take 800 mg by mouth every 6 (six) hours as needed (pain).    Historical Provider, MD  Insulin Glargine (LANTUS) 100 UNIT/ML Solostar Pen Inject 12 Units into the skin daily at 10 pm. 10/04/15   Jonetta Osgood, MD  Insulin Pen Needle 32G X 8 MM MISC Use as directed 10/04/15   Shanker Kristeen Mans, MD  LORazepam (ATIVAN) 1 MG tablet Take 1 tablet (1 mg total) by mouth 3 (three) times daily as needed for anxiety. Patient taking differently: Take 1 mg by mouth 3 (three) times daily as needed (panic attacks).  06/18/13   Tanna Furry, MD  metFORMIN (GLUCOPHAGE) 1000 MG tablet Take 1 tablet (1,000 mg total) by mouth 2 (two) times daily with a meal. 05/08/15 05/07/16  Earleen Newport, MD  oxyCODONE (OXY IR/ROXICODONE) 5 MG immediate release tablet Take 1 tablet (5 mg total) by mouth every 6 (six) hours as needed for moderate pain. 10/04/15  Shanker Kristeen Mans, MD  PARoxetine (PAXIL) 40 MG tablet Take 40 mg by mouth daily.    Historical Provider, MD    Family History No family history on file.  Social History Social History  Substance Use Topics  . Smoking status: Current Every Day Smoker    Types: Cigarettes  . Smokeless tobacco: Not on file  . Alcohol use No     Allergies   Review of patient's allergies indicates no known allergies.   Review of Systems Review of Systems  All other systems reviewed and are negative.    Physical Exam Updated Vital Signs BP (!) 172/116 (BP Location: Right Arm)   Pulse 98   Temp 98.2 F (36.8 C) (Oral)   Resp 16    SpO2 99%   Physical Exam  Nursing note and vitals reviewed.  40 year old male, resting comfortably and in no acute distress. Vital signs are significant for hypertension. Oxygen saturation is 99%, which is normal. Head is normocephalic and atraumatic. PERRLA, EOMI. Oropharynx is clear. Neck is nontender and supple without adenopathy or JVD. Back is nontender and there is no CVA tenderness. Lungs are clear without rales, wheezes, or rhonchi. Chest is nontender. Heart has regular rate and rhythm without murmur. Abdomen is soft, flat, nontender without masses or hepatosplenomegaly and peristalsis is normoactive. Extremities have no cyanosis or edema, full range of motion is present. Skin is warm and dry without rash. Neurologic: Mental status is normal, cranial nerves are intact, there are no motor or sensory deficits. Psychiatric: Depressed affect. He makes poor eye contact and speaks in a monotone.  ED Treatments / Results  Labs (all labs ordered are listed, but only abnormal results are displayed) Labs Reviewed  COMPREHENSIVE METABOLIC PANEL - Abnormal; Notable for the following:       Result Value   Sodium 132 (*)    Chloride 100 (*)    Glucose, Bld 306 (*)    All other components within normal limits  CBC  URINE RAPID DRUG SCREEN, HOSP PERFORMED  ETHANOL  SALICYLATE LEVEL  ACETAMINOPHEN LEVEL     Procedures Procedures (including critical care time)  Medications Ordered in ED Medications  alum & mag hydroxide-simeth (MAALOX/MYLANTA) 200-200-20 MG/5ML suspension 30 mL (not administered)  ondansetron (ZOFRAN) tablet 4 mg (not administered)  zolpidem (AMBIEN) tablet 5 mg (not administered)  ibuprofen (ADVIL,MOTRIN) tablet 600 mg (not administered)  LORazepam (ATIVAN) tablet 1 mg (not administered)  acetaminophen (TYLENOL) tablet 650 mg (not administered)  nicotine (NICODERM CQ - dosed in mg/24 hours) patch 14 mg (not administered)  amphetamine-dextroamphetamine  (ADDERALL) tablet 30 mg (not administered)  Armodafinil 250 mg (not administered)  glipiZIDE (GLUCOTROL) tablet 5 mg (not administered)  Insulin Glargine (LANTUS) Solostar Pen 12 Units (not administered)  PARoxetine (PAXIL) tablet 40 mg (not administered)  metFORMIN (GLUCOPHAGE) tablet 1,000 mg (not administered)     Initial Impression / Assessment and Plan / ED Course  I have reviewed the triage vital signs and the nursing notes.  Pertinent labs & imaging results that were available during my care of the patient were reviewed by me and considered in my medical decision making (see chart for details).  Clinical Course    Major depression with suicidal ideation. Also, hypertension which is poorly controlled. Blood pressure control need to be improved before he can get psychiatric care. Old records are reviewed, and he has no relevant past visits.  Repeat blood pressure has come down dramatically to level that does not need  emergent treatment. Psychiatric consultation has been requested.  Final Clinical Impressions(s) / ED Diagnoses   Final diagnoses:  Suicidal ideation  Severe episode of recurrent major depressive disorder, without psychotic features [facility])    New Prescriptions New Prescriptions   No medications on file     Delora Fuel, MD 86/76/19 [id number]

## 2016-06-10 NOTE — BH Assessment (Addendum)
Assessment Note  Dwayne Rocha is an 40 y.o. male presenting to [facility] for suicidal ideations. Patient denies current plan but states "i have quite a few things run through my mind but not a plan." Patient states that he has thought about overdosing and running his car into a tree. Patient denies current intent but states that the likelihood on a scale to one to five that he may do something to hurt himself is a "four or five." Patient denies previous attempts. Patient denies self injurious behaviors. Patient denies HI and history of aggression. Patient denies access to weapons or firearms. Patient denies pending criminal charges and upcoming court dates. Patient denies AVH and does not appear to be responding to internal stimuli. Patient denies use of drugs and alcohol. Patient UDS clear at time of assessment. Patient BAL pending.   Patient is alert and oriented x4 and is dressed in scrubs. Patient makes good eye contact and speaks logically and coherently. Patient appears depressed and is pleasant. Patient mood and affect congruent. Patient states that his most recent stressors are not seeing his children due to his ex-wife moving to [place] ten months ago without his knowledge and cheating on his girlfriend last night. Patient states that he has also "moved around a lot" at work and has changed departments frequently. Patient states that he has had ongoing court proceedings with his ex-wife and last had court on Friday. Patient states that he called his current girlfriend and informed her that he cheated due to stress and she recommended that he contact the EAP. He states that EAP encouraged him to come into the ED. Patient endorses symptoms of depression as; insomnia, tearfulness, fatigue, anhedonia, feeling hopeless, loss of appetite, and decreased bathing over the past week. Patient states that he has had a psychiatrist for the past three years. Patient states that he started to request medications for  depression in January. Patient denies inpatient treatment or therapy. Patient denies history of trauma or abuse. Patient states that his sister is supportive.  Patient is requesting inpatient admission.   Consulted with Nanine Means, DNP who recommends inpatient treatment.    Diagnosis: Major Depressive Disorder, Single Episode, Severe   Past Medical History:  Past Medical History:  Diagnosis Date  . Anxiety   . Diabetes mellitus   . Hypertension     Past Surgical History:  Procedure Laterality Date  . HIP SURGERY     "pins in hips"-"growth plate slipped"  . WRIST SURGERY      Family History: No family history on file.  Social History:  reports that he has been smoking Cigarettes.  He does not have any smokeless tobacco history on file. He reports that he does not drink alcohol or use drugs.  Additional Social History:  Alcohol / Drug Use Pain Medications: Denies Prescriptions: Denies Over the Counter: Denies History of alcohol / drug use?: No history of alcohol / drug abuse  CIWA: CIWA-Ar BP: 141/98 Pulse Rate: 98 COWS:    Allergies: No Known Allergies  Home Medications:  (Not in a hospital admission)  OB/GYN Status:  No LMP for male patient.  General Assessment Data Location of Assessment: [facility] ED TTS Assessment: In system Is this a Tele or Face-to-Face Assessment?: Face-to-Face Is this an Initial Assessment or a Re-assessment for this encounter?: Initial Assessment Marital status: Divorced Is patient pregnant?: No Pregnancy Status: No Living Arrangements: Other relatives (with best friend) Can pt return to current living arrangement?: Yes Admission Status: Voluntary Is patient  capable of signing voluntary admission?: Yes Referral Source: Self/Family/Friend     Crisis Care Plan Living Arrangements: Other relatives (with best friend) Name of Psychiatrist: Dr. Sharl Ma (3 years, last appt in Feb. Appt sceduled 9/11) Name of Therapist: None  Education  Status Is patient currently in school?: No Highest grade of school patient has completed: 12th  Risk to self with the past 6 months Suicidal Ideation: Yes-Currently Present Has patient been a risk to self within the past 6 months prior to admission? : No Suicidal Intent: No Has patient had any suicidal intent within the past 6 months prior to admission? : No Is patient at risk for suicide?: Yes Suicidal Plan?: Yes-Currently Present Has patient had any suicidal plan within the past 6 months prior to admission? : No Specify Current Suicidal Plan: overdose or run car into tree Access to Means: No What has been your use of drugs/alcohol within the last 12 months?: Denies Previous Attempts/Gestures: No How many times?: 0 Other Self Harm Risks: Denies Triggers for Past Attempts: None known Intentional Self Injurious Behavior: None Family Suicide History: No Recent stressful life event(s): Conflict (Comment), Loss (Comment), Job Loss (with ex wife and custody of his children, moving at work) Persecutory voices/beliefs?: No Depression: Yes Depression Symptoms: Insomnia, Tearfulness, Fatigue, Loss of interest in usual pleasures, Feeling worthless/self pity Substance abuse history and/or treatment for substance abuse?: No Suicide prevention information given to non-admitted patients: Not applicable  Risk to Others within the past 6 months Homicidal Ideation: No Does patient have any lifetime risk of violence toward others beyond the six months prior to admission? : No Thoughts of Harm to Others: No Current Homicidal Intent: No Current Homicidal Plan: No Access to Homicidal Means: No Identified Victim: Denies History of harm to others?: No Assessment of Violence: None Noted Violent Behavior Description: Denies Does patient have access to weapons?: No Criminal Charges Pending?: No Does patient have a court date: No Is patient on probation?: No  Psychosis Hallucinations: None  noted Delusions: None noted  Mental Status Report Appearance/Hygiene: In scrubs Eye Contact: Good Motor Activity: Unable to assess Speech: Logical/coherent Level of Consciousness: Alert Mood: Depressed Affect: Appropriate to circumstance, Depressed Anxiety Level: None Thought Processes: Coherent, Relevant Judgement: Unimpaired Orientation: Person, Place, Time, Situation, Appropriate for developmental age Obsessive Compulsive Thoughts/Behaviors: Minimal  Cognitive Functioning Concentration: Normal Memory: Recent Intact, Remote Intact IQ: Average Insight: Fair Impulse Control: Good Appetite: Fair Sleep: Decreased Total Hours of Sleep: 3 Vegetative Symptoms: Not bathing  ADLScreening [facility] Assessment Services) Patient's cognitive ability adequate to safely complete daily activities?: Yes Patient able to express need for assistance with ADLs?: Yes Independently performs ADLs?: Yes (appropriate for developmental age)  Prior Inpatient Therapy Prior Inpatient Therapy: No Prior Therapy Dates: N/A Prior Therapy Facilty/Provider(s): N/A Reason for Treatment: N/A  Prior Outpatient Therapy Prior Outpatient Therapy: Yes (Paxil, Nuvagil, Adderral, Wellbutrin, ) Prior Therapy Dates: Present Prior Therapy Facilty/Provider(s): Dr. Sharl Ma Reason for Treatment: Anxiety, Depression, Stress  Does patient have an ACCT team?: No Does patient have Intensive In-House Services?  : No Does patient have [facility] services? : No Does patient have P4CC services?: No  ADL Screening (condition at time of admission) Patient's cognitive ability adequate to safely complete daily activities?: Yes Is the patient deaf or have difficulty hearing?: No Does the patient have difficulty seeing, even when wearing glasses/contacts?: No Does the patient have difficulty concentrating, remembering, or making decisions?: No Patient able to express need for assistance with ADLs?: Yes Does the patient  have difficulty  dressing or bathing?: No Independently performs ADLs?: Yes (appropriate for developmental age) Does the patient have difficulty walking or climbing stairs?: No Weakness of Legs: None Weakness of Arms/Hands: None  Home Assistive Devices/Equipment Home Assistive Devices/Equipment: None  Therapy Consults (therapy consults require a physician order) PT Evaluation Needed: No OT Evalulation Needed: No SLP Evaluation Needed: No Abuse/Neglect Assessment (Assessment to be complete while patient is alone) Physical Abuse: Denies Verbal Abuse: Denies Sexual Abuse: Denies Exploitation of patient/patient's resources: Denies Self-Neglect: Denies Values / Beliefs Cultural Requests During Hospitalization: None Spiritual Requests During Hospitalization: None Consults Spiritual Care Consult Needed: No Social Work Consult Needed: No Merchant navy officer (For Healthcare) Does patient have an advance directive?: No Would patient like information on creating an advanced directive?: No - patient declined information    Additional Information 1:1 In Past 12 Months?: No CIRT Risk: No Elopement Risk: No Does patient have medical clearance?: Yes     Disposition:  Disposition Initial Assessment Completed for this Encounter: Yes Disposition of Patient: Inpatient treatment program (per Nanine Means, DNP) Type of inpatient treatment program: Adult  On Site Evaluation by:   Reviewed with Physician:    Krishay Faro 06/10/2016 7:12 AM

## 2016-06-10 NOTE — ED Notes (Signed)
TTS completed.  Breakfast tray ordered.

## 2016-06-10 NOTE — ED Notes (Signed)
[facility] called. 

## 2016-06-10 NOTE — ED Triage Notes (Signed)
Pt. reports depression and suicidal ideation plans to overdose on medications , denies hallucinations .  

## 2016-06-10 NOTE — ED Notes (Signed)
Report called to [facility] @ [facility]

## 2016-06-10 NOTE — BH Assessment (Addendum)
Assessment completed. Consulted with Nanine Means, DNP who recommends inpatient treatment. Informed patients nurse of disposition.   Davina Poke, LCSW Therapeutic Triage Specialist [facility] 06/10/2016 6:48 AM

## 2016-06-10 NOTE — ED Notes (Signed)
Pt belongings given to Peyton Najjar Chartered certified accountant)

## 2016-06-11 LAB — GLUCOSE, CAPILLARY
Glucose-Capillary: 167 mg/dL — ABNORMAL HIGH (ref 65–99)
Glucose-Capillary: 178 mg/dL — ABNORMAL HIGH (ref 65–99)
Glucose-Capillary: 219 mg/dL — ABNORMAL HIGH (ref 65–99)
Glucose-Capillary: 162 mg/dL — ABNORMAL HIGH (ref 65–99)

## 2016-06-11 MED ORDER — PAROXETINE HCL 20 MG PO TABS
20.0000 mg | ORAL_TABLET | Freq: Every day | ORAL | Status: DC
  Administered 2016-06-12: 20 mg via ORAL
  Filled 2016-06-11 (×3): qty 1

## 2016-06-11 MED ORDER — TRAZODONE HCL 50 MG PO TABS
50.0000 mg | ORAL_TABLET | Freq: Every evening | ORAL | Status: DC | PRN
  Administered 2016-06-12: 50 mg via ORAL
  Filled 2016-06-11 (×3): qty 1

## 2016-06-11 MED ORDER — SERTRALINE HCL 25 MG PO TABS
25.0000 mg | ORAL_TABLET | Freq: Every day | ORAL | Status: DC
  Administered 2016-06-12: 25 mg via ORAL
  Filled 2016-06-11 (×3): qty 1

## 2016-06-11 NOTE — Progress Notes (Signed)
Patient ID: Dwayne Rocha, male   DOB: May 23, 1976, 40 y.o.   MRN: [id number] Pt report feeling suicidal for about a week. Pt reports ex wife took their children away and now dealing with the legal services to try to see children. Pt reports he knows the children are in a safe place but he also needs time to spent with them. Pt denies SI/HI/AVH and pain. Pt complaint with medication.

## 2016-06-11 NOTE — BHH Suicide Risk Assessment (Signed)
[facility] Admission Suicide Risk Assessment   Nursing information obtained from:   chart review, treatment team meeting Demographic factors:   divorced, male Current Mental Status:   depression Loss Factors:   loss of contact of his children, divorced Historical Factors:   no history of suicide Risk Reduction Factors:   positive therapeutic relationship, seeking for help  Total Time spent with patient: 45 minutes Principal Problem: MDD (major depressive disorder) (HCC) Diagnosis:   Patient Active Problem List   Diagnosis Date Noted  . MDD (major depressive disorder) (HCC) [F32.9] 06/10/2016  . DM (diabetes mellitus), type 2, uncontrolled ([facility]) [E11.65] 10/02/2015  . Facial cellulitis [L03.211] 09/30/2015  . Anxiety [F41.9] 09/30/2015  . HTN (hypertension) [I10] 09/30/2015   Subjective Data:  40 year old male with history of depression, anxiety presented to ED with SI with plan to running his car into a tree or taking an overdose of pills in the setting of psychosocial stressors including discordance with his girlfriend, ex-wife and job related issues.  Continued Clinical Symptoms:    The "Alcohol Use Disorders Identification Test", Guidelines for Use in Primary Care, Second Edition.  World Science writer [facility]). Score between 0-7:  no or low risk or alcohol related problems. Score between 8-15:  moderate risk of alcohol related problems. Score between 16-19:  high risk of alcohol related problems. Score 20 or above:  warrants further diagnostic evaluation for alcohol dependence and treatment.   CLINICAL FACTORS:   Depression:   Hopelessness Insomnia   Musculoskeletal: Strength & Muscle Tone: within normal limits Gait & Station: normal Patient leans: N/A  Psychiatric Specialty Exam: Physical Exam  Neurological:  No tremors     Review of Systems  All other systems reviewed and are negative.   Blood pressure 109/71, pulse (!) 116, temperature 98.8 F (37.1 C),  temperature source Oral, resp. rate 16, height 5\' 11"  (1.803 m), weight 295 lb (133.8 kg).Body mass index is 41.14 kg/m.  General Appearance: Casual  Eye Contact:  Good  Speech:  Normal Rate  Volume:  Normal  Mood:  fine  Affect:  depressed  Thought Process:  Coherent and Goal Directed  Orientation:  Full (Time, Place, and Person)  Thought Content:  denies parnoia, no AH/VH  Suicidal Thoughts:  No  Homicidal Thoughts:  No  Memory:  intact  Judgement:  Fair  Insight:  Fair  Psychomotor Activity:  Normal  Concentration:  Concentration: Good and Attention Span: Good  Recall:  Fair  Fund of Knowledge:  Good  Language:  Good  Akathisia:  No  Handed:  Ambidextrous  AIMS (if indicated):     Assets:  Communication Skills Desire for Improvement  ADL's:  Intact  Cognition:  WNL  Sleep:         COGNITIVE FEATURES THAT CONTRIBUTE TO RISK:  Polarized thinking and None    SUICIDE RISK:   Moderate:  Frequent suicidal ideation with limited intensity, and duration, some specificity in terms of plans, no associated intent, good self-control, limited dysphoria/symptomatology, some risk factors present, and identifiable protective factors, including available and accessible social support.   PLAN OF CARE:  Psychiatry admission, Cross tapering from Paxil to sertraline. Engage in group activity, therapeutic milieu, appropriate outpatient follow up appointment   I certify that inpatient services furnished can reasonably be expected to improve the patient's condition.  Neysa Hotter, MD 06/11/2016, 11:05 AM

## 2016-06-11 NOTE — BHH Group Notes (Signed)

## 2016-06-11 NOTE — Plan of Care (Signed)
Problem: Education: Goal: Utilization of techniques to improve thought processes will improve Outcome: Progressing Nurse discussed depression/coping skills with patient.    

## 2016-06-11 NOTE — Progress Notes (Signed)
D:  Patient's self inventory sheet, patient has fair sleep, no sleep medication given.  Poor appetite, normal energy level, good concentration.  Rated depression 5, hopeless 4, anxiety 2.  Denied withdrawals.  Denied SI.  Denied physical problems.  Denied pain.  Goal is betting better, discharge.  No discharge plans. A:  Medications administered per MD orders.  Emotional support and encouragement given patient. R:  Denied SI and HI, contracts for safety.  Denied A/V hallucinations.  Safety maintained with 15 minute checks.

## 2016-06-11 NOTE — Tx Team (Signed)
Initial Interdisciplinary Treatment Plan   PATIENT STRESSORS: Financial difficulties Legal issue Marital or family conflict   PATIENT STRENGTHS: Capable of independent living General fund of knowledge Motivation for treatment/growth   PROBLEM LIST: Problem List/Patient Goals Date to be addressed Date deferred Reason deferred Estimated date of resolution  depression 06/11/2016     anxiety 06/11/2016     Risk for suicide 06/11/2016     Legal issues with family 06/11/2016     "need help to get mind straight" 06/11/2016                              DISCHARGE CRITERIA:  Improved stabilization in mood, thinking, and/or behavior Motivation to continue treatment in a less acute level of care Need for constant or close observation no longer present  PRELIMINARY DISCHARGE PLAN: Participate in family therapy Return to previous living arrangement Return to previous work or school arrangements  PATIENT/FAMIILY INVOLVEMENT: This treatment plan has been presented to and reviewed with the patient, Dwayne Rocha, The patient and family have been given the opportunity to ask questions and make suggestions.  JEHU-APPIAH, Karynn Deblasi K 06/11/2016, 6:46 AM

## 2016-06-11 NOTE — Progress Notes (Signed)
Patient ID: Dwayne Rocha, male   DOB: 10-15-1976, 40 y.o.   MRN: [id number] D: Patient in bed resting on approach. Pt mood and affect appeared depressed and flat. Pt did not forward much but cooperate.   A: Medications administered as prescribed. Pt encourage to attend groups and engage in milieu..   R: Patient remains safe and compliant with medication.

## 2016-06-11 NOTE — H&P (Addendum)
Psychiatric Admission Assessment Adult  Patient Identification: Dwayne Rocha MRN:  [id number] Date of Evaluation:  06/11/2016 Chief Complaint:  MDD SINGLE EPISODE; SEVERE Principal Diagnosis: <principal problem not specified> Diagnosis:   Patient Active Problem List   Diagnosis Date Noted  . MDD (major depressive disorder) ([place]) [F32.9] 06/10/2016  . DM (diabetes mellitus), type 2, uncontrolled ([place]) [E11.65] 10/02/2015  . Facial cellulitis [E08.144] 09/30/2015  . Anxiety [F41.9] 09/30/2015  . HTN (hypertension) [I10] 09/30/2015   History of Present Illness:  40 year old male with history of depression, anxiety presented to ED with SI with plan to running his car into a tree or taking an overdose of pills.   He states that he has been stressed and things "piled up" for two years. His ex-wife moved to [place] taking his children without notice last Nov. There would be a court due to this issues. 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 He also reports recent discordance with his girlfriend. There is worsening in his depression ans SI since then; his girlfriend recommended him to be evaluated at the hospital.  He feels better since admission and denies current SI. He still feels overwhelmed thinking about his stressors.   He was first diagnosed anxiety stress disorder in 2013 when he was started on Paxil. He sees little benefit from this medication. No previous suicide attempt. No previous psychiatry admission. He used to take Adderal once a week/Nuvigil every day depending on his shift. He has not taken Ativan for two years.    Associated Signs/Symptoms: Depression Symptoms:  depressed mood, (Hypo) Manic Symptoms:  denies Anxiety Symptoms:  mild anxiety Psychotic Symptoms:  denies paranoia, AH/VH PTSD Symptoms: denies Total Time spent with patient: 45 minutes  Past Psychiatric History: depression, "anxiety stress disorder"  Is the patient at risk to self? Yes.     Has the patient been a risk to self in the past 6 months? No.  Has the patient been a risk to self within the distant past? No.  Is the patient a risk to others? No.  Has the patient been a risk to others in the past 6 months? No.  Has the patient been a risk to others within the distant past? No.   Prior Inpatient Therapy:  none Prior Outpatient Therapy:  since 2012 for anxiety stress disorder  Alcohol Screening: 1. How often do you have a drink containing alcohol?: Monthly or less Substance Abuse History in the last 12 months:  No. Consequences of Substance Abuse: NA Previous Psychotropic Medications: Yes  Psychological Evaluations: Yes  Past Medical History:  Past Medical History:  Diagnosis Date  . Anxiety   . Diabetes mellitus   . Hypertension     Past Surgical History:  Procedure Laterality Date  . HIP SURGERY     "pins in hips"-"growth plate slipped"  . WRIST SURGERY     Family History: History reviewed. No pertinent family history. Family Psychiatric  History: denies Tobacco Screening: smokes 1 PPD per week, declines option of nicotine patch Social History:  History  Alcohol Use  . Yes    Comment: about 6x a year     History  Drug Use No    Additional Social History:     Lives with his friend since last Nov. Used to live with his ex-wife until she moved to [place] with his children.   Works as a Merchant [employer] officer, for 9 years  Allergies:  No Known Allergies Lab Results:  Results for orders placed or performed during the hospital encounter of 06/10/16 (from the past 48 hour(s))  Glucose, capillary     Status: Abnormal   Collection Time: 06/10/16 11:44 PM  Result Value Ref Range   Glucose-Capillary 247 (H) 65 - 99 mg/dL  Glucose, capillary     Status: Abnormal   Collection Time: 06/11/16  6:11 AM  Result Value Ref Range   Glucose-Capillary 167 (H) 65 - 99 mg/dL   Comment 1 Notify RN     Blood Alcohol level:  Lab Results   Component Value Date   ETH <5 30/86/5784    Metabolic Disorder Labs:  Lab Results  Component Value Date   HGBA1C 11.1 (H) 10/02/2015   MPG 272 10/02/2015   No results found for: PROLACTIN No results found for: CHOL, TRIG, HDL, CHOLHDL, VLDL, LDLCALC  Current Medications: Current Facility-Administered Medications  Medication Dose Route Frequency Provider Last Rate Last Dose  . acetaminophen (TYLENOL) tablet 650 mg  650 mg Oral Q6H PRN Laverle Hobby, PA-C      . alum & mag hydroxide-simeth (MAALOX/MYLANTA) 200-200-20 MG/5ML suspension 30 mL  30 mL Oral Q4H PRN Laverle Hobby, PA-C      . insulin aspart (novoLOG) injection 0-20 Units  0-20 Units Subcutaneous TID WC Laverle Hobby, PA-C   4 Units at 06/11/16 [phone]  . insulin aspart (novoLOG) injection 0-5 Units  0-5 Units Subcutaneous QHS Laverle Hobby, PA-C   2 Units at 06/10/16 2355  . insulin glargine (LANTUS) injection 8 Units  8 Units Subcutaneous QHS Laverle Hobby, PA-C   8 Units at 06/10/16 2350  . magnesium hydroxide (MILK OF MAGNESIA) suspension 30 mL  30 mL Oral Daily PRN Laverle Hobby, PA-C      . metFORMIN (GLUCOPHAGE) tablet 1,000 mg  1,000 mg Oral BID WC Laverle Hobby, PA-C   1,000 mg at 06/11/16 0909  . PARoxetine (PAXIL) tablet 40 mg  40 mg Oral Daily Laverle Hobby, PA-C   40 mg at 06/11/16 9528  . traZODone (DESYREL) tablet 50 mg  50 mg Oral QHS Laverle Hobby, PA-C   50 mg at 06/10/16 2350   PTA Medications: Prescriptions Prior to Admission  Medication Sig Dispense Refill Last Dose  . amphetamine-dextroamphetamine (ADDERALL) 30 MG tablet Take 30 mg by mouth daily.   0 06/09/2016 at Unknown time  . Armodafinil (NUVIGIL) 250 MG tablet Take 250 mg by mouth See admin instructions. Take 1 tablet (250 mg) by mouth at beginning of work day (varies according to shift scheduled)   06/09/2016 at Unknown time  . clindamycin (CLEOCIN) 300 MG capsule Take 1 capsule (300 mg total) by mouth 4 (four) times daily. (Patient  not taking: Reported on 06/10/2016) 32 capsule 0 Completed Course at Unknown time  . fluconazole (DIFLUCAN) 150 MG tablet Take 1 tablet (150 mg total) by mouth once as needed. For candidiasis (Patient not taking: Reported on 06/10/2016) 1 tablet 0 Completed Course at Unknown time  . glucose monitoring kit (FREESTYLE) monitoring kit 1 each by Does not apply route 4 (four) times daily - after meals and at bedtime. 1 month Diabetic Testing Supplies for QAC-QHS accuchecks. 1 each 1   . Insulin Pen Needle 32G X 8 MM MISC Use as directed 100 each 0   . LORazepam (ATIVAN) 1 MG tablet Take 1 tablet (1 mg total) by mouth 3 (three) times daily as needed for  anxiety. (Patient not taking: Reported on 06/10/2016) 15 tablet 0 Not Taking at Unknown time  . metFORMIN (GLUCOPHAGE) 1000 MG tablet Take 1 tablet (1,000 mg total) by mouth 2 (two) times daily with a meal. 60 tablet 11 06/09/2016 at Unknown time  . oxyCODONE (OXY IR/ROXICODONE) 5 MG immediate release tablet Take 1 tablet (5 mg total) by mouth every 6 (six) hours as needed for moderate pain. (Patient not taking: Reported on 06/10/2016) 15 tablet 0 Completed Course at Unknown time  . PARoxetine (PAXIL) 40 MG tablet Take 40 mg by mouth daily.   06/09/2016 at Unknown time    Musculoskeletal: Strength & Muscle Tone: within normal limits Gait & Station: normal Patient leans: N/A  Psychiatric Specialty Exam: Physical Exam  Constitutional: He appears well-developed and well-nourished.  Neurological:  No tremors     Review of Systems  All other systems reviewed and are negative.   Blood pressure 109/71, pulse (!) 116, temperature 98.8 F (37.1 C), temperature source Oral, resp. rate 16, height 5' 11"  (1.803 m), weight 295 lb (133.8 kg).Body mass index is 41.14 kg/m.  General Appearance: Casual  Eye Contact:  Good  Speech:  Normal Rate  Volume:  Normal  Mood:  fine  Affect:  down but smiles  Thought Process:  Coherent and Goal Directed  Orientation:   Full (Time, Place, and Person)  Thought Content:  Logical and no parnoia  Suicidal Thoughts:  No  Homicidal Thoughts:  No  Memory:  intact  Judgement:  Fair  Insight:  Fair  Psychomotor Activity:  Normal  Concentration:  Concentration: Good and Attention Span: Good  Recall:  Good  Fund of Knowledge:  Good  Language:  Good  Akathisia:  No  Handed:  Ambidextrous  AIMS (if indicated):     Assets:  Communication Skills Desire for Improvement  ADL's:  Intact  Cognition:  WNL  Sleep:      Assessment 40 year old male with history of depression, anxiety presented to ED with SI with plan to running his car into a tree or taking an overdose of pills in the setting of psychosocial stressors including discordance with his girlfriend, ex-wife and job related issues.  # MDD, severe, recurrent He endorses neurovegetative symptoms in the setting of stressors as above. Given he has limited benefit from Paxil, will plan to cross taper to sertraline. Side effects including nausea, vomiting, sexual dysfunction are discussed. Patient denies SI today and contracts for safety.   - Decrease Paxil 20 mg daily - Start sertraline 25 mg daily - Trazodone 50 mg qhsprn for insomnia - He agreed to hold Adderal, Nuvigil, Ativan during this admission - Encourage to participate in groups, therapeutic milieu   Treatment Plan Summary: Daily contact with patient to assess and evaluate symptoms and progress in treatment  Observation Level/Precautions:  15 minute checks  Laboratory:  as needed  Psychotherapy:  Group and individual  Medications:   As above  Consultations:   As needed  Discharge Concerns:  -  Estimated LOS: 3-5 days  Other:     I certify that inpatient services furnished can reasonably be expected to improve the patient's condition.    Norman Clay, MD 8/1/201710:19 AM

## 2016-06-11 NOTE — Progress Notes (Signed)
Adult Psychoeducational Group Note  Date:  06/11/2016 Time:  1:14 AM  Group Topic/Focus:  Wrap-Up Group:   The focus of this group is to help patients review their daily goal of treatment and discuss progress on daily workbooks.   Participation Level:  Did Not Attend   Additional Comments:  Pt had not been admitted to unit at this time. Gwyndolyn Kaufman 06/11/2016, 1:14 AM

## 2016-06-11 NOTE — Progress Notes (Signed)
Recreation Therapy Notes  Animal-Assisted Activity (AAA) Program Checklist/Progress Notes Patient Eligibility Criteria Checklist & Daily Group note for Rec TxIntervention  Date: 08.01.2017 Time: 2:45pm Location: [address] Dayroom    AAA/T Program Assumption of Risk Form signed by Patient/ or Parent Legal Guardian Yes  Patient is free of allergies or sever asthma Yes  Patient reports no fear of animals Yes  Patient reports no history of cruelty to animals Yes  Patient understands his/her participation is voluntary Yes  Behavioral Response: Did not attend.   Monae Topping L Ineta Sinning, LRT/CTRS  Sherrita Riederer L 06/11/2016 4:00 PM 

## 2016-06-12 LAB — GLUCOSE, CAPILLARY
Glucose-Capillary: 138 mg/dL — ABNORMAL HIGH (ref 65–99)
Glucose-Capillary: 160 mg/dL — ABNORMAL HIGH (ref 65–99)
Glucose-Capillary: 203 mg/dL — ABNORMAL HIGH (ref 65–99)
Glucose-Capillary: 182 mg/dL — ABNORMAL HIGH (ref 65–99)

## 2016-06-12 MED ORDER — SERTRALINE HCL 50 MG PO TABS
50.0000 mg | ORAL_TABLET | Freq: Every day | ORAL | Status: DC
  Administered 2016-06-13: 50 mg via ORAL
  Filled 2016-06-12 (×3): qty 1

## 2016-06-12 NOTE — Progress Notes (Signed)
Patient stated that he had a great day. Patient rated day a 10. Patient stated that he did not learn anything in group new, but was happy because he wanted to spend time with his Child psychotherapist. Patient denies any depression, anxiety, and hopelessness. Patient denies SI, HI, or AVH. Patient stated that he had a slight headache.   Patient remains safe on unit with q 15 min checks.  Patient offered support, encouragement, and education. Blood sugar was 182 mg/dL. Medications were administered.   Patient was receptive and cooperative. Will continue to monitor.

## 2016-06-12 NOTE — Progress Notes (Signed)
Recreation Therapy Notes  Date: 06/12/16 Time: 0930 Location: 300 [address] Group Room  Group Topic: Stress Management  Goal Area(s) Addresses:  Patient will verbalize importance of using healthy stress management.  Patient will identify positive emotions associated with healthy stress management.   Intervention: Stress Management   Activity :  Progressive Muscle Relaxation.  LRT introduced the technique of progressive muscle relaxation to patients.  Patients were asked to follow along with LRT as a script was read to guide patients through the activity.  Education:  Stress Management, Discharge Planning.    Clinical Observations/Feedback: Pt did not attend group.   Sundeep Cary, LRT/CTRS  

## 2016-06-12 NOTE — [facility] Group Notes (Signed)
Late Entry from 06/11/16:   [facility] LCSW Group Therapy  06/11/16 1:15 PM   Type of Therapy:  Group Therapy  Participation Level:  Minimal  Participation Quality:  Attentive  Affect:  Blunted  Cognitive:  Alert and Oriented  Insight:  Developing/Improving and Engaged  Engagement in Therapy:  Developing/Improving and Engaged  Modes of Intervention:  Clarification, Confrontation, Discussion, Education, Exploration, Limit-setting, Orientation, Problem-solving, Rapport Building, Reality Testing, Socialization and Support  Summary of Progress/Problems: The topic for group therapy was feelings about diagnosis.  Pt actively participated in group discussion on their past and current diagnosis and how they feel towards this.  Pt also identified how society and family members judge them, based on their diagnosis as well as stereotypes and stigmas.  Patient participated minimally in group discussion despite CSW encouragement.   Kripa Foskey, MSW, LCSW Clinical Social Worker [facility] [phone]     

## 2016-06-12 NOTE — [facility] Counselor (Signed)
Adult Comprehensive Assessment  Patient ID: Dwayne Rocha, male   DOB: 02-04-1976, 40 y.o.   MRN: [id number]  Information Source: Information source: Patient  Current Stressors:  Educational / Learning stressors: High school graduate Employment / Job issues: full time employee at [employer], works swing shift Family Relationships: ex wife has taken his 3 children to [facility] in violation of parenting order, pt currently fighting this in Therapist, sports / Lack of resources (include bankruptcy): strained due to legal fees, child support and benefits payments Housing / Lack of housing: can return to current living situation Physical health (include injuries & life threatening diseases): diabetic on insulin, has not had insulin since moving from [place] in Nov 2016; no PCP, cannot reschedule w [facility] or [facility] practices Social relationships: current relationship w girlfriend Substance abuse: denies Bereavement / Loss: no concerns voiced  Living/Environment/Situation:  Living Arrangements: Other (Comment) (lives with best friend's family) Living conditions (as described by patient or guardian): stable living situation w best friend How long has patient lived in current situation?: 09/2015 What is atmosphere in current home: Supportive  Family History:  Marital status: Divorced Divorced, when?: 06/2015; separated 05/2014 What types of issues is patient dealing with in the relationship?: ex wife is hard of hearing, moved to [facility] w 3 children, pt in court trying to get children returned to [place], per pt, legal struggles have been substantial Are you sexually active?: Yes What is your sexual orientation?: heterosexual Has your sexual activity been affected by drugs, alcohol, medication, or emotional stress?: no Does patient have children?: Yes How many children?: 3 How is patient's relationship with their children?: close to children ages 58, 17, 5 - frustrated that they were  moved to [facility]  Childhood History:  By whom was/is the patient raised?: Both parents Description of patient's relationship with caregiver when they were a child: good, normal childhood Patient's description of current relationship with people who raised him/her: doesnt see much of mother and father who live in [place], mother has early dementia How were you disciplined when you got in trouble as a child/adolescent?: "I didnt get in trouble, I knew what would happen." Does patient have siblings?: Yes Number of Siblings: 2 Description of patient's current relationship with siblings: brother and sister in [place], brother works at [employer] and [employer] w pt, supportive Did patient suffer any verbal/emotional/physical/sexual abuse as a child?: No Did patient suffer from severe childhood neglect?: No Has patient ever been sexually abused/assaulted/raped as an adolescent or adult?: No Was the patient ever a victim of a crime or a disaster?: No Witnessed domestic violence?: No  Education:  Highest grade of school patient has completed: 12th Currently a student?: No Learning disability?: No  Employment/Work Situation:   Employment situation: Employed 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, Administrator, arts How long has patient been employed?: 9 years Patient's job has been impacted by current illness: Yes Describe how patient's job has been impacted: concerned about Metallurgist about current hospitalization; does not have his # What is the longest time patient has a held a job?: 9 years Where was the patient employed at that time?: same as above Has patient ever been in the [employer]?: No Has patient ever served in combat?: No Did You Receive Any Psychiatric Treatment/Services While in Equities trader?: No Are There Guns or Other Weapons in Your Home?: No  Financial Resources:   Financial resources: Income from employment, Private insurance Does patient have a  representative payee or guardian?: No  Alcohol/Substance Abuse:   What has been your use of drugs/alcohol within the last 12 months?: Denies all current use If attempted suicide, did drugs/alcohol play a role in this?: No Alcohol/Substance Abuse Treatment Hx: Denies past history Has alcohol/substance abuse ever caused legal problems?: No  Social Support System:   Patient's Community Support System: Good Describe Community Support System: "a few good friends that I have known for a long time." Type of faith/religion: "I believe in God but I dont think much of religion" How does patient's faith help to cope with current illness?: see above  Leisure/Recreation:   Leisure and Hobbies: "I work all the time", enjoys bowling, shooting pool, movies  Strengths/Needs:   What things does the patient do well?: devoted, caring father, hard worker In what areas does patient struggle / problems for patient: "I trust people too easily"  Discharge Plan:   Does patient have access to transportation?: Yes (car in lot at [facility]) Will patient be returning to same living situation after discharge?: Yes Currently receiving community mental health services: Yes (From Whom) (Rupinder Evelene Croon - next appt is 9/11; pt does not want to reschedule as this fits his work schedule) If no, would patient like referral for services when discharged?: No Does patient have financial barriers related to discharge medications?: No  Summary/Recommendations:   Summary and Recommendations (to be completed by the evaluator): Patient is a 40 year old male, admitted voluntarily and diagnosed with Major Depressive Disorder.  Stressors include interpersonal conflict, court battle re location of his children, strained finances and heavy work schedule w rotating shifts.  Pt sees Rupinder Evelene Croon for medications management and wants to return at discharge.  Pt has current girlfriend, lives w good friend.  Is diabetic but has not had insulin since  moving from [place] in Nov 2016.  Wants referral for PCP for diabetes care.  Patient will benefit from hospitailization for crisis stabilization, medication management, group psychotherapy and psychoeducation.  Discharge case management will assist w aftercare referrals, will return to current providers.  Sallee Lange 06/12/2016

## 2016-06-12 NOTE — Progress Notes (Signed)
D: Patient seen on dayroom watching TV and multiple times on phone. Denies pain, SI, AH/VH at this time. CBG at bedtime was 162 mg/dl. No coverage per sliding scale. Insulin Lantus 8 mg/dl given. Patient made no further complaints.  A: Staff offered support and encouragement as needed. Meds. given as ordered. Safety maintained by 15 minutes check. Will continue to monitor patient for safety and stability.  R: Patient remains safe.

## 2016-06-12 NOTE — [facility] Group Notes (Signed)
[facility] LCSW Group Therapy Note  Date/Time: 06/12/2016   1:30PM  Type of Therapy and Topic:  Group Therapy:  Who Am I?  Self Esteem, Self-Actualization and Understanding Self.  Participation Level:  Minimal  Description of Group:    In this group patients will be asked to explore values, beliefs, truths, and morals as they relate to personal self.  Patients will be guided to discuss their thoughts, feelings, and behaviors related to what they identify as important to their true self. Patients will process together how values, beliefs and truths are connected to specific choices patients make every day. Each patient will be challenged to identify changes that they are motivated to make in order to improve self-esteem and self-actualization. This group will be process-oriented, with patients participating in exploration of their own experiences as well as giving and receiving support and challenge from other group members.  Therapeutic Goals: 1. Patient will identify false beliefs that currently interfere with their self-esteem.  2. Patient will identify feelings, thought process, and behaviors related to self and will become aware of the uniqueness of themselves and of others.  3. Patient will be able to identify and verbalize values, morals, and beliefs as they relate to self. 4. Patient will begin to learn how to build self-esteem/self-awareness by expressing what is important and unique to them personally.  Summary of Patient Progress  Patient participated minimally in discussion despite encouragement.     Therapeutic Modalities:   Cognitive Behavioral Therapy Solution Focused Therapy Motivational Interviewing Brief Therapy   Jasmia Angst, LCSW Clinical Social Worker [facility] [phone]   

## 2016-06-12 NOTE — Progress Notes (Signed)
[facility] MD Progress Note  06/12/2016 6:44 PM Caylin Raby  MRN:  [id number] Subjective:  Patient reports he still feels depressed, but better than prior to admission. Reports he has been facing significant stressors,mainly related to marital separation- wife moved ( with their child ) to [place].  Another stressor is related to work- he states that his job requires significant shift changes , sometimes working nights, sometimes days, for which he was using Nuvigil prior to admission.  Objective : I have discussed case with treatment team and have met with patient. Patient reports that he has been on Paxil for more than a year, and that he does not feel this medication is helping much. He was started on Zoloft on admission, and thus far has not had any side effects. Patient has been going to some  groups, visible on unit , but participation is limited at this time . No disruptive or agitated behaviors, pleasant on approach. As noted, denies any current suicidal ideations , contracts for safety on the unit .  Principal Problem: MDD (major depressive disorder) ([place]) Diagnosis:   Patient Active Problem List   Diagnosis Date Noted  . MDD (major depressive disorder) ([place]) [F32.9] 06/10/2016  . DM (diabetes mellitus), type 2, uncontrolled ([place]) [E11.65] 10/02/2015  . Facial cellulitis [Z30.076] 09/30/2015  . Anxiety [F41.9] 09/30/2015  . HTN (hypertension) [I10] 09/30/2015   Total Time spent with patient: 20 minutes    Past Medical History:  Past Medical History:  Diagnosis Date  . Anxiety   . Diabetes mellitus   . Hypertension     Past Surgical History:  Procedure Laterality Date  . HIP SURGERY     "pins in hips"-"growth plate slipped"  . WRIST SURGERY     Family History: History reviewed. No pertinent family history.  Social History:  History  Alcohol Use  . Yes    Comment: about 6x a year     History  Drug Use No    Social History   Social History  . Marital status:  Married    Spouse name: N/A  . Number of children: N/A  . Years of education: N/A   Social History Main Topics  . Smoking status: Current Every Day Smoker    Packs/day: 0.50    Years: 12.00    Types: Cigarettes  . Smokeless tobacco: Current User  . Alcohol use Yes     Comment: about 6x a year  . Drug use: No  . Sexual activity: Yes   Other Topics Concern  . None   Social History Narrative  . None   Additional Social History:   Sleep: improved   Appetite:  Fair  Current Medications: Current Facility-Administered Medications  Medication Dose Route Frequency Provider Last Rate Last Dose  . acetaminophen (TYLENOL) tablet 650 mg  650 mg Oral Q6H PRN Laverle Hobby, PA-C      . alum & mag hydroxide-simeth (MAALOX/MYLANTA) 200-200-20 MG/5ML suspension 30 mL  30 mL Oral Q4H PRN Laverle Hobby, PA-C      . insulin aspart (novoLOG) injection 0-20 Units  0-20 Units Subcutaneous TID WC Laverle Hobby, PA-C   7 Units at 06/12/16 1733  . insulin aspart (novoLOG) injection 0-5 Units  0-5 Units Subcutaneous QHS Laverle Hobby, PA-C   2 Units at 06/10/16 2355  . insulin glargine (LANTUS) injection 8 Units  8 Units Subcutaneous QHS Laverle Hobby, PA-C   8 Units at 06/11/16 2116  . magnesium hydroxide (MILK OF  MAGNESIA) suspension 30 mL  30 mL Oral Daily PRN Laverle Hobby, PA-C      . metFORMIN (GLUCOPHAGE) tablet 1,000 mg  1,000 mg Oral BID WC Laverle Hobby, PA-C   1,000 mg at 06/12/16 1734  . [START ON 06/13/2016] sertraline (ZOLOFT) tablet 50 mg  50 mg Oral Daily Jenne Campus, MD      . traZODone (DESYREL) tablet 50 mg  50 mg Oral QHS PRN Norman Clay, MD        Lab Results:  Results for orders placed or performed during the hospital encounter of 06/10/16 (from the past 48 hour(s))  Glucose, capillary     Status: Abnormal   Collection Time: 06/10/16 11:44 PM  Result Value Ref Range   Glucose-Capillary 247 (H) 65 - 99 mg/dL  Glucose, capillary     Status: Abnormal    Collection Time: 06/11/16  6:11 AM  Result Value Ref Range   Glucose-Capillary 167 (H) 65 - 99 mg/dL   Comment 1 Notify RN   Glucose, capillary     Status: Abnormal   Collection Time: 06/11/16 12:03 PM  Result Value Ref Range   Glucose-Capillary 178 (H) 65 - 99 mg/dL  Glucose, capillary     Status: Abnormal   Collection Time: 06/11/16  5:18 PM  Result Value Ref Range   Glucose-Capillary 219 (H) 65 - 99 mg/dL  Glucose, capillary     Status: Abnormal   Collection Time: 06/11/16  8:59 PM  Result Value Ref Range   Glucose-Capillary 162 (H) 65 - 99 mg/dL   Comment 1 Notify RN   Glucose, capillary     Status: Abnormal   Collection Time: 06/12/16  6:29 AM  Result Value Ref Range   Glucose-Capillary 138 (H) 65 - 99 mg/dL  Glucose, capillary     Status: Abnormal   Collection Time: 06/12/16 12:00 PM  Result Value Ref Range   Glucose-Capillary 160 (H) 65 - 99 mg/dL  Glucose, capillary     Status: Abnormal   Collection Time: 06/12/16  4:56 PM  Result Value Ref Range   Glucose-Capillary 203 (H) 65 - 99 mg/dL    Blood Alcohol level:  Lab Results  Component Value Date   ETH <5 95/62/1308    Metabolic Disorder Labs: Lab Results  Component Value Date   HGBA1C 11.1 (H) 10/02/2015   MPG 272 10/02/2015   No results found for: PROLACTIN No results found for: CHOL, TRIG, HDL, CHOLHDL, VLDL, LDLCALC  Physical Findings: AIMS: Facial and Oral Movements Muscles of Facial Expression: None, normal Lips and Perioral Area: None, normal Jaw: None, normal Tongue: None, normal,Extremity Movements Upper (arms, wrists, hands, fingers): None, normal Lower (legs, knees, ankles, toes): None, normal, Trunk Movements Neck, shoulders, hips: None, normal, Overall Severity Severity of abnormal movements (highest score from questions above): None, normal Incapacitation due to abnormal movements: None, normal Patient's awareness of abnormal movements (rate only patient's report): No Awareness, Dental  Status Current problems with teeth and/or dentures?: No Does patient usually wear dentures?: No  CIWA:  CIWA-Ar Total: 1 COWS:  COWS Total Score: 2  Musculoskeletal: Strength & Muscle Tone: within normal limits Gait & Station: normal Patient leans: N/A  Psychiatric Specialty Exam: Physical Exam  ROS denies chest pain, no shortness of breath, no nausea or vomiting   Blood pressure 91/70, pulse 92, temperature 98.1 F (36.7 C), temperature source Oral, resp. rate 12, height _0  (1.803 m), weight 295 lb (133.8 kg).Body mass index is 41.14  kg/m.  General Appearance: Fairly Groomed  Eye Contact:  Good  Speech:  Normal Rate  Volume:  Normal  Mood:  Depressed, reports slight improvement in mood   Affect:  constricted but reactive - smiles briefly at times   Thought Process:  Linear  Orientation:  Other:  fully alert and attentive   Thought Content:  denies hallucinations , no delusions expressed, not internally preoccupied   Suicidal Thoughts:  No at this time denies any suicidal ideations, denies any self injurious ideations   Homicidal Thoughts:  No denies any homicidal or violent ideations   Memory:  recent and remote grossly intact   Judgement:  Fair  Insight:  Fair  Psychomotor Activity:  Normal  Concentration:  Concentration: Good and Attention Span: Good  Recall:  Good  Fund of Knowledge:  Good  Language:  Good  Akathisia:  Negative  Handed:  Right  AIMS (if indicated):     Assets:  Desire for Improvement Resilience  ADL's:  Intact  Cognition:  WNL  Sleep:  Number of Hours: 6.25   Assessment  Patient reports slight improvement of mood, but does continue to present with a constricted affect and ruminations about stressors. Denies active suicidal ideations at this time and contracts for safety on the unit . Major stressor is ex wife having moved out of state with their children, and current legal issues to try to address this . Reports paxil no longer working, wanting  to change it , and was started on low dose Zoloft, which he has tolerated well thus far .   Treatment Plan Summary: Daily contact with patient to assess and evaluate symptoms and progress in treatment, Medication management, Plan inpatient treatment  and medications as below  Encourage group, milieu participation to work on coping skills and symptom reduction  Continue Zoloft 50 mgrs QDAY for depression and anxiety D/C Paxil  Continue Trazodone 50 mgrs QHS for insomnia as needed  Continue Metformin/Insulin for DM management  Treatment team working on disposition planning  Neita Garnet, MD 06/12/2016, 6:44 PM

## 2016-06-12 NOTE — Tx Team (Signed)
Interdisciplinary Treatment Plan Update (Adult) Date: 06/12/2016    Time Reviewed: 9:30 AM  Progress in Treatment: Attending groups: Continuing to assess, patient new to milieu Participating in groups: Continuing to assess, patient new to milieu Taking medication as prescribed: Yes Tolerating medication: Yes Family/Significant other contact made: No, CSW assessing for appropriate contacts Patient understands diagnosis: Yes Discussing patient identified problems/goals with staff: Yes Medical problems stabilized or resolved: Yes Denies suicidal/homicidal ideation: Yes Issues/concerns per patient self-inventory: Yes Other:  New problem(s) identified: N/A  Discharge Plan or Barriers: CSW continuing to assess, patient new to milieu.  Reason for Continuation of Hospitalization:  Depression Anxiety Medication Stabilization   Comments: N/A  Estimated length of stay: 3-5 days   Patient is a 40 year old male who presented to the hospital with SI. Primary triggers for admission include family and relationship stressors. Patient will benefit from crisis stabilization, medication evaluation, group therapy and psycho education in addition to case management for discharge planning. At discharge, it is recommended that Pt remain compliant with established discharge plan and continued treatment.   Review of initial/current patient goals per problem list:  1. Goal(s): Patient will have aftercare plan. Met: No Target date: 3-5 days post admission date  8/2: Goal not met: CSW assessing for appropriate referrals for pt and will have follow up secured prior to d/c.    2. Goal (s): Patient will exhibit decreased depressive symptoms and suicidal ideations.   Met: No   Target date: 3-5 days post admission date   As evidenced by: Patient will utilize self rating of depression at 3 or below and demonstrate decreased signs of depression or be deemed stable for discharge by MD.  8/2: Goal  not met: Pt presents with flat affect and depressed mood.  Pt admitted with high levels of depression. Pt to show decreased sign of depression and a rating of 3 or less before d/c.     3. Goal(s): Patient will demonstrate decreased signs and symptoms of anxiety.   Met: No   Target date: 3-5 days post admission date   As evidenced by: Patient will utilize self rating of anxiety at 3 or below and demonstrated decreased signs of anxiety, or be deemed stable for discharge by MD  8/2: Goal not met: Pt presents with anxious mood and affect.  Pt admitted with high anxiety levels. Pt to show decreased sign of anxiety and a rating of 3 or less before d/c.    Attendees: Patient:    Family:    Physician: Dr. Parke Poisson; Dr. Shea Evans; Dr. Modesta Messing  06/12/2016 9:30 AM  Nursing: Elesa Massed, Otilio Carpen, RN 06/12/2016 9:30 AM  Clinical Social Worker: Erasmo Downer Uzziah Rigg, LCSW 06/12/2016 9:30 AM  Other: Peri Maris, LCSW; [facility], LCSW  06/12/2016 9:30 AM  Other:  06/12/2016 9:30 AM  Other: Lars Pinks, Case Manager 06/12/2016 9:30 AM  Other:  Lockie Mola, May Augustin, NP 06/12/2016 9:30 AM  Other:    Other:      Scribe for Treatment Team:  Tilden Fossa, [place]

## 2016-06-12 NOTE — Progress Notes (Signed)
D- Patient has been active on the unit this shift, attending groups engaged appropriately with peers, and compliant with medications and therapy.  Patient denies SI, HI, and AVH and reports no depression and minimal anxiety.  Patient reported good sleep last night and stated that his goal is to work on discharge.  Patient was pleasant upon approach and had a bright affect this shift.   A- Assess patient for safety, offer medications as prescribed, engage patient in 1:1 staff talks.   R - Patient was able to contract for safety, continue to monitor as prescribed 

## 2016-06-12 NOTE — Progress Notes (Signed)
The focus of this group is to help patients review their daily goal of treatment and discuss progress on daily workbooks.  Patient reported to group today and stated that his goal of the day was to get some real clothes and get out of the scrubs. Patient states that he had a decent day.

## 2016-06-13 LAB — GLUCOSE, CAPILLARY
Glucose-Capillary: 157 mg/dL — ABNORMAL HIGH (ref 65–99)
Glucose-Capillary: 175 mg/dL — ABNORMAL HIGH (ref 65–99)
Glucose-Capillary: 278 mg/dL — ABNORMAL HIGH (ref 65–99)
Glucose-Capillary: 137 mg/dL — ABNORMAL HIGH (ref 65–99)

## 2016-06-13 MED ORDER — SERTRALINE HCL 100 MG PO TABS
100.0000 mg | ORAL_TABLET | Freq: Every day | ORAL | Status: DC
  Administered 2016-06-14 – 2016-06-17 (×4): 100 mg via ORAL
  Filled 2016-06-13 (×6): qty 1

## 2016-06-13 NOTE — Progress Notes (Addendum)
Patient ID: Dwayne Rocha, male   DOB: [birth year], 40 y.o.   MRN: [id number] [facility] MD Progress Note  06/13/2016 4:04 PM Dwayne Rocha  MRN:  [id number] Subjective:  Patient continues to report a sense of sadness and depression, but does state he feels better than he did prior to admission . He continues to identify custody issues as main stressor: he states his ex wife moved to [place] with their children, and so now he cannot see them. He is now in the midst of legal proceedings in order to try to get to see his children. Thus far denies any medication side effects and has tolerated Zoloft trial well .    Objective : I have discussed case with treatment team and have met with patient. Denies medication side effects. Some groups participation, but remains somewhat isolative, with little interactions with peers, keeping to self, often in his room.  No disruptive or agitated behaviors, pleasant on approach. Denies suicidal ideations . Denies any psychotic symptoms.   Principal Problem: MDD (major depressive disorder) ([place]) Diagnosis:   Patient Active Problem List   Diagnosis Date Noted  . MDD (major depressive disorder) ([place]) [F32.9] 06/10/2016  . DM (diabetes mellitus), type 2, uncontrolled ([place]) [E11.65] 10/02/2015  . Facial cellulitis [N82.956] 09/30/2015  . Anxiety [F41.9] 09/30/2015  . HTN (hypertension) [I10] 09/30/2015   Total Time spent with patient: 20 minutes    Past Medical History:  Past Medical History:  Diagnosis Date  . Anxiety   . Diabetes mellitus   . Hypertension     Past Surgical History:  Procedure Laterality Date  . HIP SURGERY     "pins in hips"-"growth plate slipped"  . WRIST SURGERY     Family History: History reviewed. No pertinent family history.  Social History:  History  Alcohol Use  . Yes    Comment: about 6x a year     History  Drug Use No    Social History   Social History  . Marital status: Married    Spouse name: N/A  . Number of  children: N/A  . Years of education: N/A   Social History Main Topics  . Smoking status: Current Every Day Smoker    Packs/day: 0.50    Years: 12.00    Types: Cigarettes  . Smokeless tobacco: Current User  . Alcohol use Yes     Comment: about 6x a year  . Drug use: No  . Sexual activity: Yes   Other Topics Concern  . None   Social History Narrative  . None   Additional Social History:   Sleep: improved   Appetite: improving   Current Medications: Current Facility-Administered Medications  Medication Dose Route Frequency Provider Last Rate Last Dose  . acetaminophen (TYLENOL) tablet 650 mg  650 mg Oral Q6H PRN Laverle Hobby, PA-C   650 mg at 06/12/16 2228  . alum & mag hydroxide-simeth (MAALOX/MYLANTA) 200-200-20 MG/5ML suspension 30 mL  30 mL Oral Q4H PRN Laverle Hobby, PA-C      . insulin aspart (novoLOG) injection 0-20 Units  0-20 Units Subcutaneous TID WC Laverle Hobby, PA-C   4 Units at 06/13/16 1223  . insulin aspart (novoLOG) injection 0-5 Units  0-5 Units Subcutaneous QHS Laverle Hobby, PA-C   2 Units at 06/10/16 2355  . insulin glargine (LANTUS) injection 8 Units  8 Units Subcutaneous QHS Laverle Hobby, PA-C   8 Units at 06/12/16 2229  . magnesium hydroxide (MILK OF MAGNESIA) suspension  30 mL  30 mL Oral Daily PRN Laverle Hobby, PA-C      . metFORMIN (GLUCOPHAGE) tablet 1,000 mg  1,000 mg Oral BID WC Laverle Hobby, PA-C   1,000 mg at 06/13/16 0802  . sertraline (ZOLOFT) tablet 50 mg  50 mg Oral Daily Jenne Campus, MD   50 mg at 06/13/16 0803  . traZODone (DESYREL) tablet 50 mg  50 mg Oral QHS PRN Norman Clay, MD   50 mg at 06/12/16 2257    Lab Results:  Results for orders placed or performed during the hospital encounter of 06/10/16 (from the past 48 hour(s))  Glucose, capillary     Status: Abnormal   Collection Time: 06/11/16  5:18 PM  Result Value Ref Range   Glucose-Capillary 219 (H) 65 - 99 mg/dL  Glucose, capillary     Status: Abnormal    Collection Time: 06/11/16  8:59 PM  Result Value Ref Range   Glucose-Capillary 162 (H) 65 - 99 mg/dL   Comment 1 Notify RN   Glucose, capillary     Status: Abnormal   Collection Time: 06/12/16  6:29 AM  Result Value Ref Range   Glucose-Capillary 138 (H) 65 - 99 mg/dL  Glucose, capillary     Status: Abnormal   Collection Time: 06/12/16 12:00 PM  Result Value Ref Range   Glucose-Capillary 160 (H) 65 - 99 mg/dL  Glucose, capillary     Status: Abnormal   Collection Time: 06/12/16  4:56 PM  Result Value Ref Range   Glucose-Capillary 203 (H) 65 - 99 mg/dL  Glucose, capillary     Status: Abnormal   Collection Time: 06/12/16  8:40 PM  Result Value Ref Range   Glucose-Capillary 182 (H) 65 - 99 mg/dL   Comment 1 Notify RN   Glucose, capillary     Status: Abnormal   Collection Time: 06/13/16  6:08 AM  Result Value Ref Range   Glucose-Capillary 157 (H) 65 - 99 mg/dL   Comment 1 Notify RN    Comment 2 Document in Chart   Glucose, capillary     Status: Abnormal   Collection Time: 06/13/16 12:14 PM  Result Value Ref Range   Glucose-Capillary 175 (H) 65 - 99 mg/dL    Blood Alcohol level:  Lab Results  Component Value Date   ETH <5 24/23/5361    Metabolic Disorder Labs: Lab Results  Component Value Date   HGBA1C 11.1 (H) 10/02/2015   MPG 272 10/02/2015   No results found for: PROLACTIN No results found for: CHOL, TRIG, HDL, CHOLHDL, VLDL, LDLCALC  Physical Findings: AIMS: Facial and Oral Movements Muscles of Facial Expression: None, normal Lips and Perioral Area: None, normal Jaw: None, normal Tongue: None, normal,Extremity Movements Upper (arms, wrists, hands, fingers): None, normal Lower (legs, knees, ankles, toes): None, normal, Trunk Movements Neck, shoulders, hips: None, normal, Overall Severity Severity of abnormal movements (highest score from questions above): None, normal Incapacitation due to abnormal movements: None, normal Patient's awareness of abnormal  movements (rate only patient's report): No Awareness, Dental Status Current problems with teeth and/or dentures?: No Does patient usually wear dentures?: No  CIWA:  CIWA-Ar Total: 1 COWS:  COWS Total Score: 2  Musculoskeletal: Strength & Muscle Tone: within normal limits Gait & Station: normal Patient leans: N/A  Psychiatric Specialty Exam: Physical Exam  ROS denies chest pain, no shortness of breath, no nausea or vomiting   Blood pressure 128/79, pulse (!) 102, temperature 98.5 F (36.9 C), temperature source  Oral, resp. rate 12, height _0  (1.803 m), weight 295 lb (133.8 kg).Body mass index is 41.14 kg/m.  General Appearance: Fairly Groomed  Eye Contact:  Good  Speech:  Normal Rate  Volume:  Normal  Mood:  He reports partial improvement , but remains depressed, sad  Affect:  constricted but reactive - smiles  at times   Thought Process:  Linear  Orientation:  Other:  fully alert and attentive   Thought Content:  denies hallucinations , no delusions expressed, not internally preoccupied   Suicidal Thoughts:  No at this time denies any suicidal ideations, denies any self injurious ideations   Homicidal Thoughts:  No denies any homicidal or violent ideations- specifically also denies any violent or homicidal ideations towards  Ex wife    Memory:  recent and remote grossly intact   Judgement:  Improving   Insight:  Improving   Psychomotor Activity:  decreased  Concentration:  Concentration: Good and Attention Span: Good  Recall:  Good  Fund of Knowledge:  Good  Language:  Good  Akathisia:  Negative  Handed:  Right  AIMS (if indicated):     Assets:  Desire for Improvement Resilience  ADL's:  Intact  Cognition:  WNL  Sleep:  Number of Hours: 6.5   Assessment  Patient presents with partial improvement , but remains depressed, constricted in affect. Currently denies any suicidal ideations . Thus far tolerating Zoloft trial well , denies side effects.   Treatment Plan  Summary: Daily contact with patient to assess and evaluate symptoms and progress in treatment, Medication management, Plan inpatient treatment  and medications as below  Encourage group, milieu participation to work on coping skills and symptom reduction  Increase Zoloft to 100 mgrs QDAY for depression and anxiety Continue Trazodone 50 mgrs QHS for insomnia as needed  Continue Metformin/Insulin for DM management  Treatment team working on disposition planning  Neita Garnet, MD 06/13/2016, 4:04 PM

## 2016-06-13 NOTE — [facility] Group Notes (Signed)
[facility] LCSW Group Therapy 06/13/2016 1:15 PM Type of Therapy: Group Therapy Participation Level: Active  Participation Quality: Attentive, Sharing and Supportive  Affect: Blunted  Cognitive: Alert and Oriented  Insight: Developing/Improving and Engaged  Engagement in Therapy: Developing/Improving and Engaged  Modes of Intervention: Activity, Clarification, Confrontation, Discussion, Education, Exploration, Limit-setting, Orientation, Problem-solving, Rapport Building, Reality Testing, Socialization and Support  Summary of Progress/Problems: Patient was attentive and engaged with speaker from [employer]. Patient was attentive to speaker while they shared their story of dealing with mental health and overcoming it. Patient expressed interest in their programs and services and received information on their agency. Patient processed ways they can relate to the speaker.   Abhay Godbolt, LCSW Clinical Social Worker [facility] [phone]   

## 2016-06-13 NOTE — Progress Notes (Signed)
D- Patient has been active on the unit this shift, attending groups engaged appropriately with peers, and compliant with medications and therapy.  Patient denies SI, HI, and AVH and reports no depression and minimal anxiety.  Patient reported good sleep last night and stated that his goal is to work on discharge.  Patient was pleasant upon approach and had a bright affect this shift.   A- Assess patient for safety, offer medications as prescribed, engage patient in 1:1 staff talks.   R - Patient was able to contract for safety, continue to monitor as prescribed

## 2016-06-13 NOTE — [facility] Suicide Risk Assessment (Signed)
[facility] INPATIENT:  Family/Significant Other Suicide Prevention Education  Suicide Prevention Education:  Patient Refusal for Family/Significant Other Suicide Prevention Education: The patient Dwayne Rocha has refused to provide written consent for family/significant other to be provided Family/Significant Other Suicide Prevention Education during admission and/or prior to discharge.  Physician notified. SPE reviewed with patient and brochure provided. Patient encouraged to return to hospital if having suicidal thoughts, patient verbalized his/her understanding and has no further questions at this time.   Moneisha Vosler, [place] 06/13/2016, 11:34 AM

## 2016-06-14 LAB — GLUCOSE, CAPILLARY
Glucose-Capillary: 135 mg/dL — ABNORMAL HIGH (ref 65–99)
Glucose-Capillary: 171 mg/dL — ABNORMAL HIGH (ref 65–99)
Glucose-Capillary: 166 mg/dL — ABNORMAL HIGH (ref 65–99)
Glucose-Capillary: 173 mg/dL — ABNORMAL HIGH (ref 65–99)
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
## 2016-06-14 NOTE — [facility] Group Notes (Signed)
[facility] LCSW Group Therapy 06/14/2016  1:15 pm   Type of Therapy: Group Therapy Participation Level: Minimal  Participation Quality: Attentive  Affect: Blunted  Cognitive: Alert and Oriented  Insight: Developing/Improving and Engaged  Engagement in Therapy: Developing/Improving and Engaged  Modes of Intervention: Clarification, Confrontation, Discussion, Education, Exploration, Limit-setting, Orientation, Problem-solving, Rapport Building, Dance movement psychotherapist, Socialization and Support  Summary of Progress/Problems: The topic for group was balance in life. Today's group focused on defining balance in one's own words, identifying things that can knock one off balance, and exploring healthy ways to maintain balance in life. Group members were asked to provide an example of a time when they felt off balance, describe how they handled that situation,and process healthier ways to regain balance in the future. Group members were asked to share the most important tool for maintaining balance that they learned while at [facility] and how they plan to apply this method after discharge. Patient participated minimally in group discussion despite CSW encouragement.   Samuella Bruin, MSW, LCSW Clinical Social Worker [facility] [phone]

## 2016-06-14 NOTE — Progress Notes (Signed)
Dwayne Rocha has done well today. He takes his scheduled meds as planned and he deneis complaint. [name] completes his daily assessment and on it he writes  He deneis SI today and he rates his depression, hopelessness and anxeity " 0/0/0", repsectively. RSafety in place.

## 2016-06-14 NOTE — Progress Notes (Signed)
Recreation Therapy Notes  Date: 06/14/16 Time: 0930 Location: [address] Group Room  Group Topic: Stress Management  Goal Area(s) Addresses:  Patient will verbalize importance of using healthy stress management.  Patient will identify positive emotions associated with healthy stress management.   Intervention: Stress Management  Activity :  Peaceful Waves Guided Imagery.  LRT introduced to the technique of guided imagery to the patients.  Patients were asked to follow along with LRT as a script was read to engage in the technique.  Education:  Stress Management, Discharge Planning.    Clinical Observations/Feedback: Pt did not attend group.    Kassie Keng, LRT/CTRS  

## 2016-06-14 NOTE — Progress Notes (Signed)
D Pt. Denies SI and HI, no complaints of pain or discomfort noted at present time.    A Writer offered support and encouragement.  R Pt. Rated his day a 10, denies any anxiety, depression or anger.  Pt. Takes his medications as scheduled. States he does not need a sleep medication.  Pt. Remains safe on the unit.

## 2016-06-14 NOTE — Progress Notes (Signed)
D: Pt at the time of assessment denied depression, anxiety, pain, SI, HI or AVH; states, "I feel good; I feel wonderful" Pt was observed interaction with his visitor and with peers. Pt became flat and withdrawn to self after his visitor left. Pt remained calm and cooperative. A: Medications offered as prescribed.  Support, encouragement, and safe environment provided.  15-minute safety checks continue. R: Pt was med compliant.  Pt attended karaoke group. Safety checks continue.

## 2016-06-14 NOTE — Progress Notes (Signed)
Inpatient Diabetes Program Recommendations  AACE/ADA: New Consensus Statement on Inpatient Glycemic Control (2015)  Target Ranges:  Prepandial:   less than 140 mg/dL      Peak postprandial:   less than 180 mg/dL (1-2 hours)      Critically ill patients:  140 - 180 mg/dL   Lab Results  Component Value Date   GLUCAP 171 (H) 06/14/2016   HGBA1C 11.1 (H) 10/02/2015   Results for ZAKERY, DAFFIN (MRN [id number]) as of 06/14/2016 14:19  Ref. Range 06/13/2016 17:08 06/13/2016 19:49 06/14/2016 06:08 06/14/2016 11:59  Glucose-Capillary Latest Ref Range: 65 - 99 mg/dL 017 (H) 494 (H) 496 (H) 171 (H)   Review of Glycemic Control  Diabetes history: DM2 Outpatient Diabetes medications: Lantus 12 units QHS, glipizide 5 mg bid, Novolog s/s Current orders for Inpatient glycemic control: Lantus 8 units QHS, Novolog resistant tidwc and hs, metformin 1000 mg bid  Inpatient Diabetes Program Recommendations:    Titrate Lantus if am CBG > 180 mg/dL. Case manager consult for obtaining PCP to manage DM and ? Whether pt can afford his diabetes meds.   Will follow. Thank you. Ailene Ards, RD, LDN, CDE Inpatient Diabetes Coordinator [phone]

## 2016-06-15 DIAGNOSIS — F332 Major depressive disorder, recurrent severe without psychotic features: Principal | ICD-10-CM

## 2016-06-15 LAB — BASIC METABOLIC PANEL
Anion gap: 6 (ref 5–15)
BUN: 11 mg/dL (ref 6–20)
CO2: 28 mmol/L (ref 22–32)
Calcium: 9 mg/dL (ref 8.9–10.3)
Chloride: 102 mmol/L (ref 101–111)
Creatinine, Ser: 0.92 mg/dL (ref 0.61–1.24)
GFR calc Af Amer: >60 mL/min (ref >60)
GFR calc non Af Amer: >60 mL/min (ref >60)
Glucose, Bld: 137 mg/dL — ABNORMAL HIGH (ref 65–99)
Potassium: 4.2 mmol/L (ref 3.5–5.1)
Sodium: 136 mmol/L (ref 135–145)

## 2016-06-15 LAB — GLUCOSE, CAPILLARY
Glucose-Capillary: 144 mg/dL — ABNORMAL HIGH (ref 65–99)
Glucose-Capillary: 186 mg/dL — ABNORMAL HIGH (ref 65–99)
Glucose-Capillary: 158 mg/dL — ABNORMAL HIGH (ref 65–99)
Glucose-Capillary: 218 mg/dL — ABNORMAL HIGH (ref 65–99)

## 2016-06-15 NOTE — Progress Notes (Signed)
[name] cont to be very quiet, isolative and non committal, regarding his feelings about his illness. He is flat, depressed and avoids  eye contact with this Clinical research associate. . [name] does take his medications as scheduled and he rates his depression, hopelessness and anxiety " 0/0/0/", respectively  and  writes he denies suicidal ideation today R Safety in plae.

## 2016-06-15 NOTE — Progress Notes (Signed)
Patient ID: Dwayne Rocha, male   DOB: Sep 16, 1976, 39 y.o.   MRN: [id number]   Pt currently presents with a flat affect and depressed behavior. Pt reports to writer that their goal is to "go home." Pt minimizes depression and stressors at home. Forwards little to Clinical research associate. Pt reports good sleep without taking any medications.  Pt provided with medications per providers orders. Pt's labs and vitals were monitored throughout the night. Pt supported emotionally and encouraged to express concerns and questions. Pt educated on medications.  Pt's safety ensured with 15 minute and environmental checks. Pt currently denies SI/HI and A/V hallucinations. Pt verbally agrees to seek staff if SI/HI or A/VH occurs and to consult with staff before acting on any harmful thoughts. Will continue POC.

## 2016-06-15 NOTE — Progress Notes (Signed)
Patient ID: Dwayne Rocha, male   DOB: 04-Jan-1976, 40 y.o.   MRN: [id number] [facility] MD Progress Note  06/15/2016 11:10 AM Dwayne Rocha  MRN:  [id number] Subjective:  Patient reports he is feeling sad but somewhat better.  At this time does not endorse medication side effects. Continues to focus on stressors that includes legal related to his kids.   Objective : I have discussed case with treatment team and have met with patient. Patient presents with a partially improved mood and range of affect. Denies any suicidal ideations . Major stressor is related to his ex SO taking his children out of state, which he now trying to address via lawyer/ court . Denies any violent or homicidal ideations towards his ex SO.  Denies medication side effects.  Staff reports patient still presenting somewhat flat in affect,although improved compared to admission. More visible on unit. No self hurting recent acts.     Principal Problem: MDD (major depressive disorder) ([place]) Diagnosis:   Patient Active Problem List   Diagnosis Date Noted  . MDD (major depressive disorder) ([place]) [F32.9] 06/10/2016  . DM (diabetes mellitus), type 2, uncontrolled ([place]) [E11.65] 10/02/2015  . Facial cellulitis [D53.299] 09/30/2015  . Anxiety [F41.9] 09/30/2015  . HTN (hypertension) [I10] 09/30/2015   Total Time spent with patient: 25 minutes    Past Medical History:  Past Medical History:  Diagnosis Date  . Anxiety   . Diabetes mellitus   . Hypertension     Past Surgical History:  Procedure Laterality Date  . HIP SURGERY     "pins in hips"-"growth plate slipped"  . WRIST SURGERY     Family History: History reviewed. No pertinent family history.  Social History:  History  Alcohol Use  . Yes    Comment: about 6x a year     History  Drug Use No    Social History   Social History  . Marital status: Married    Spouse name: N/A  . Number of children: N/A  . Years of education: N/A   Social History  Main Topics  . Smoking status: Current Every Day Smoker    Packs/day: 0.50    Years: 12.00    Types: Cigarettes  . Smokeless tobacco: Current User  . Alcohol use Yes     Comment: about 6x a year  . Drug use: No  . Sexual activity: Yes   Other Topics Concern  . None   Social History Narrative  . None   Additional Social History:   Sleep: improved   Appetite:  Fair  Current Medications: Current Facility-Administered Medications  Medication Dose Route Frequency Provider Last Rate Last Dose  . acetaminophen (TYLENOL) tablet 650 mg  650 mg Oral Q6H PRN Laverle Hobby, PA-C   650 mg at 06/12/16 2228  . alum & mag hydroxide-simeth (MAALOX/MYLANTA) 200-200-20 MG/5ML suspension 30 mL  30 mL Oral Q4H PRN Laverle Hobby, PA-C      . insulin aspart (novoLOG) injection 0-20 Units  0-20 Units Subcutaneous TID WC Laverle Hobby, PA-C   3 Units at 06/15/16 [phone]  . insulin aspart (novoLOG) injection 0-5 Units  0-5 Units Subcutaneous QHS Laverle Hobby, PA-C   2 Units at 06/10/16 2355  . insulin glargine (LANTUS) injection 8 Units  8 Units Subcutaneous QHS Laverle Hobby, PA-C   8 Units at 06/14/16 2045  . magnesium hydroxide (MILK OF MAGNESIA) suspension 30 mL  30 mL Oral Daily PRN Laverle Hobby, PA-C      .  metFORMIN (GLUCOPHAGE) tablet 1,000 mg  1,000 mg Oral BID WC Laverle Hobby, PA-C   1,000 mg at 06/15/16 0909  . sertraline (ZOLOFT) tablet 100 mg  100 mg Oral Daily Jenne Campus, MD   100 mg at 06/15/16 0909  . traZODone (DESYREL) tablet 50 mg  50 mg Oral QHS PRN Norman Clay, MD   50 mg at 06/12/16 2257    Lab Results:  Results for orders placed or performed during the hospital encounter of 06/10/16 (from the past 48 hour(s))  Glucose, capillary     Status: Abnormal   Collection Time: 06/13/16 12:14 PM  Result Value Ref Range   Glucose-Capillary 175 (H) 65 - 99 mg/dL  Glucose, capillary     Status: Abnormal   Collection Time: 06/13/16  5:08 PM  Result Value Ref Range    Glucose-Capillary 278 (H) 65 - 99 mg/dL  Glucose, capillary     Status: Abnormal   Collection Time: 06/13/16  7:49 PM  Result Value Ref Range   Glucose-Capillary 137 (H) 65 - 99 mg/dL  Glucose, capillary     Status: Abnormal   Collection Time: 06/14/16  6:08 AM  Result Value Ref Range   Glucose-Capillary 135 (H) 65 - 99 mg/dL  Glucose, capillary     Status: Abnormal   Collection Time: 06/14/16 11:59 AM  Result Value Ref Range   Glucose-Capillary 171 (H) 65 - 99 mg/dL  Glucose, capillary     Status: Abnormal   Collection Time: 06/14/16  5:27 PM  Result Value Ref Range   Glucose-Capillary 166 (H) 65 - 99 mg/dL  Glucose, capillary     Status: Abnormal   Collection Time: 06/14/16  8:39 PM  Result Value Ref Range   Glucose-Capillary 173 (H) 65 - 99 mg/dL   Comment 1 Notify RN   Glucose, capillary     Status: Abnormal   Collection Time: 06/15/16  6:10 AM  Result Value Ref Range   Glucose-Capillary 144 (H) 65 - 99 mg/dL   Comment 1 Notify RN   Basic metabolic panel     Status: Abnormal   Collection Time: 06/15/16  6:12 AM  Result Value Ref Range   Sodium 136 135 - 145 mmol/L   Potassium 4.2 3.5 - 5.1 mmol/L   Chloride 102 101 - 111 mmol/L   CO2 28 22 - 32 mmol/L   Glucose, Bld 137 (H) 65 - 99 mg/dL   BUN 11 6 - 20 mg/dL   Creatinine, Ser 0.92 0.61 - 1.24 mg/dL   Calcium 9.0 8.9 - 10.3 mg/dL   GFR calc non Af Amer >60 >60 mL/min   GFR calc Af Amer >60 >60 mL/min    Comment: (NOTE) The eGFR has been calculated using the CKD EPI equation. This calculation has not been validated in all clinical situations. eGFR's persistently <60 mL/min signify possible Chronic Kidney Disease.    Anion gap 6 5 - 15    Comment: Performed at [facility]    Blood Alcohol level:  Lab Results  Component Value Date   [facility] <5 97/12/6376    Metabolic Disorder Labs: Lab Results  Component Value Date   HGBA1C 11.1 (H) 10/02/2015   MPG 272 10/02/2015   No results found for:  PROLACTIN No results found for: CHOL, TRIG, HDL, CHOLHDL, VLDL, LDLCALC  Physical Findings: AIMS: Facial and Oral Movements Muscles of Facial Expression: None, normal Lips and Perioral Area: None, normal Jaw: None, normal Tongue: None, normal,Extremity Movements  Upper (arms, wrists, hands, fingers): None, normal Lower (legs, knees, ankles, toes): None, normal, Trunk Movements Neck, shoulders, hips: None, normal, Overall Severity Severity of abnormal movements (highest score from questions above): None, normal Incapacitation due to abnormal movements: None, normal Patient's awareness of abnormal movements (rate only patient's report): No Awareness, Dental Status Current problems with teeth and/or dentures?: No Does patient usually wear dentures?: No  CIWA:  CIWA-Ar Total: 1 COWS:  COWS Total Score: 2  Musculoskeletal: Strength & Muscle Tone: within normal limits Gait & Station: normal Patient leans: N/A  Psychiatric Specialty Exam: Physical Exam  Review of Systems  Cardiovascular: Negative for chest pain.  Gastrointestinal: Negative for nausea.  Psychiatric/Behavioral: Positive for depression. Negative for suicidal ideas.   denies chest pain, no shortness of breath, no nausea or vomiting   Blood pressure 136/82, pulse 99, temperature 98.4 F (36.9 C), temperature source Oral, resp. rate 16, height 5' 11"  (1.803 m), weight 133.8 kg (295 lb).Body mass index is 41.14 kg/m.  General Appearance: Fairly Groomed  Eye Contact:  Good  Speech:  Normal Rate  Volume:  Normal  Mood: reports he feels better  Affect:  Less constricted, improved range of affect   Thought Process:  Linear  Orientation:  Other:  fully alert and attentive   Thought Content:  denies hallucinations , no delusions expressed, not internally preoccupied   Suicidal Thoughts:  No at this time denies any suicidal ideations, denies any self injurious ideations   Homicidal Thoughts:  No denies any homicidal or violent  ideations   Memory:  recent and remote grossly intact   Judgement:  Improving   Insight:  Improving   Psychomotor Activity:  Normal  Concentration:  Concentration: Good and Attention Span: Good  Recall:  Good  Fund of Knowledge:  Good  Language:  Good  Akathisia:  Negative  Handed:  Right  AIMS (if indicated):     Assets:  Desire for Improvement Resilience  ADL's:  Intact  Cognition:  WNL  Sleep:  Number of Hours: 6.5   Assessment  Patient continues to present with improving mood ,Of note, patient 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 He is unsure this exacerbated his depression, takes Nuvigil at home for this, states this should now be less of an issue, because his shifts are going to be extended so he only needs to make one switch a month  .   Treatment Plan Summary: Daily contact with patient to assess and evaluate symptoms and progress in treatment, Medication management, Plan inpatient treatment  and medications as below  Encourage group, milieu participation to work on coping skills and symptom reduction No change in meds for today  Continue Zoloft now at 100  mgrs QDAY for depression and anxiety Continue Trazodone 50 mgrs QHS for insomnia as needed  Continue Metformin/Insulin for DM management  Treatment team working on disposition planning - consider discharge soon as he continues to improve  Possible in next 2 days. Merian Capron, MD 06/15/2016, 11:10 AM

## 2016-06-15 NOTE — [facility] Group Notes (Signed)
[facility] Group Notes:  (Nursing/MHT/Case Management/Adjunct)  Date:  06/15/2016  Time:  1400  Type of Therapy:  Life SKills  /  Identifying Needs:  The group focuses on teaching patients how to identify their needs as well as develop healthy skills needed to get their needs met.  Participation Level:  Minimal  Participation Quality:  Attentive  Affect:  Flat  Cognitive:  Alert  Insight:  Limited  Engagement in Group:  Engaged  Modes of Intervention:  Education  Summary of Progress/Problems:  Dwayne Rocha 06/15/2016, 4:39 PM

## 2016-06-15 NOTE — [facility] Group Notes (Signed)
[facility] Group Notes:  (Clinical Social Work)  06/15/2016     1:15-2:15PM  Summary of Progress/Problems:   The main focus of today's process group was to explore the perceived benefits and costs of unhealthy coping techniques, as well as the  benefits and costs of replacing these with healthy coping skills.  We then discussed why it is so hard to pursue healthier coping techniques, talked about any barriers and how to overcome them. The patient was late to group and expressed little but listened attentively.  Type of Therapy:  Group Therapy - Process   Participation Level:  Minimal  Participation Quality:  Attentive  Affect:  Blunted  Cognitive:  Appropriate  Insight:  Limited  Engagement in Therapy:  Limited  Modes of Intervention:  Education, Motivational Interviewing  Ambrose Mantle, LCSW 06/15/2016, 4:08 PM

## 2016-06-15 NOTE — Progress Notes (Signed)
[facility] Group Notes:  (Nursing/MHT/Case Management/Adjunct)  Date:  06/15/2016  Time:  2045   Type of Therapy:  wrap up group  Participation Level:  Minimal  Participation Quality:  Resistant  Affect:  Flat  Cognitive:  Appropriate  Insight:  Lacking  Engagement in Group:  Limited  Modes of Intervention:  Clarification, Education and Support  Summary of Progress/Problems: Pt reported the day being uneventful and did not share any information as to how he was doing or feeling or to why his was here. Pt shared that he has a girlfriend and wants to get back to work. Pt is grateful for friends and family.   Johann Capers S 06/15/2016, 9:03 PM

## 2016-06-16 LAB — GLUCOSE, CAPILLARY
Glucose-Capillary: 108 mg/dL — ABNORMAL HIGH (ref 65–99)
Glucose-Capillary: 173 mg/dL — ABNORMAL HIGH (ref 65–99)
Glucose-Capillary: 142 mg/dL — ABNORMAL HIGH (ref 65–99)
Glucose-Capillary: 112 mg/dL — ABNORMAL HIGH (ref 65–99)

## 2016-06-16 NOTE — Progress Notes (Signed)
Adult Psychoeducational Group Note  Date:  06/16/2016 Time:  9:30 PM  Group Topic/Focus:  Wrap-Up Group:   The focus of this group is to help patients review their daily goal of treatment and discuss progress on daily workbooks.   Participation Level:  Active  Participation Quality:  Appropriate  Affect:  Appropriate  Cognitive:  Alert  Insight: Appropriate  Engagement in Group:  Engaged  Modes of Intervention:  Discussion  Additional Comments:  Patient states, "I had a good day". Patient stated he will be discharging tomorrow. Kayton Dunaj L Graig Hessling 06/16/2016, 9:30 PM

## 2016-06-16 NOTE — Progress Notes (Signed)
Patient ID: Dwayne Rocha, male   DOB: 05-29-1976, 40 y.o.   MRN: [id number] [facility] MD Progress Note  06/16/2016 11:27 AM Dwayne Rocha  MRN:  [id number] Subjective:  Patient reports he is feeling sad in particular to recent stressor but tolerating medications and better then yesterday.  Has presented with suicidal threats and depression stemming from his x wife taking awy his kids to [place] and conflicts in current relationship .  Continues to focus on stressors that includes legal related to his kids.   Objective : I have discussed case with treatment team and have met with patient. Patient presents with a partially improved mood and range of affect. Denies any suicidal ideations . Major stressor is related to his ex SO taking his children out of state, which he now trying to address via lawyer/ court . Denies any violent or homicidal ideations towards his ex SO.  Denies medication side effects.  Staff reports patient still presenting somewhat flat in affect,although improved compared to admission. More visible on unit. No self hurting recent acts.     Principal Problem: MDD (major depressive disorder) ([place]) Diagnosis:   Patient Active Problem List   Diagnosis Date Noted  . MDD (major depressive disorder) ([place]) [F32.9] 06/10/2016  . DM (diabetes mellitus), type 2, uncontrolled ([place]) [E11.65] 10/02/2015  . Facial cellulitis [Z16.967] 09/30/2015  . Anxiety [F41.9] 09/30/2015  . HTN (hypertension) [I10] 09/30/2015   Total Time spent with patient: 25 minutes    Past Medical History:  Past Medical History:  Diagnosis Date  . Anxiety   . Diabetes mellitus   . Hypertension     Past Surgical History:  Procedure Laterality Date  . HIP SURGERY     "pins in hips"-"growth plate slipped"  . WRIST SURGERY     Family History: History reviewed. No pertinent family history.  Social History:  History  Alcohol Use  . Yes    Comment: about 6x a year     History  Drug Use No     Social History   Social History  . Marital status: Married    Spouse name: N/A  . Number of children: N/A  . Years of education: N/A   Social History Main Topics  . Smoking status: Current Every Day Smoker    Packs/day: 0.50    Years: 12.00    Types: Cigarettes  . Smokeless tobacco: Current User  . Alcohol use Yes     Comment: about 6x a year  . Drug use: No  . Sexual activity: Yes   Other Topics Concern  . None   Social History Narrative  . None   Additional Social History:   Sleep: improved   Appetite:  Fair  Current Medications: Current Facility-Administered Medications  Medication Dose Route Frequency Provider Last Rate Last Dose  . acetaminophen (TYLENOL) tablet 650 mg  650 mg Oral Q6H PRN Laverle Hobby, PA-C   650 mg at 06/12/16 2228  . alum & mag hydroxide-simeth (MAALOX/MYLANTA) 200-200-20 MG/5ML suspension 30 mL  30 mL Oral Q4H PRN Laverle Hobby, PA-C      . insulin aspart (novoLOG) injection 0-20 Units  0-20 Units Subcutaneous TID WC Laverle Hobby, PA-C   4 Units at 06/15/16 1720  . insulin aspart (novoLOG) injection 0-5 Units  0-5 Units Subcutaneous QHS Laverle Hobby, PA-C   2 Units at 06/15/16 2132  . insulin glargine (LANTUS) injection 8 Units  8 Units Subcutaneous QHS Laverle Hobby, PA-C   8  Units at 06/15/16 2132  . magnesium hydroxide (MILK OF MAGNESIA) suspension 30 mL  30 mL Oral Daily PRN Laverle Hobby, PA-C      . metFORMIN (GLUCOPHAGE) tablet 1,000 mg  1,000 mg Oral BID WC Laverle Hobby, PA-C   1,000 mg at 06/16/16 0910  . sertraline (ZOLOFT) tablet 100 mg  100 mg Oral Daily Jenne Campus, MD   100 mg at 06/16/16 0910  . traZODone (DESYREL) tablet 50 mg  50 mg Oral QHS PRN Norman Clay, MD   50 mg at 06/12/16 2257    Lab Results:  Results for orders placed or performed during the hospital encounter of 06/10/16 (from the past 48 hour(s))  Glucose, capillary     Status: Abnormal   Collection Time: 06/14/16 11:59 AM  Result Value  Ref Range   Glucose-Capillary 171 (H) 65 - 99 mg/dL  Glucose, capillary     Status: Abnormal   Collection Time: 06/14/16  5:27 PM  Result Value Ref Range   Glucose-Capillary 166 (H) 65 - 99 mg/dL  Glucose, capillary     Status: Abnormal   Collection Time: 06/14/16  8:39 PM  Result Value Ref Range   Glucose-Capillary 173 (H) 65 - 99 mg/dL   Comment 1 Notify RN   Glucose, capillary     Status: Abnormal   Collection Time: 06/15/16  6:10 AM  Result Value Ref Range   Glucose-Capillary 144 (H) 65 - 99 mg/dL   Comment 1 Notify RN   Basic metabolic panel     Status: Abnormal   Collection Time: 06/15/16  6:12 AM  Result Value Ref Range   Sodium 136 135 - 145 mmol/L   Potassium 4.2 3.5 - 5.1 mmol/L   Chloride 102 101 - 111 mmol/L   CO2 28 22 - 32 mmol/L   Glucose, Bld 137 (H) 65 - 99 mg/dL   BUN 11 6 - 20 mg/dL   Creatinine, Ser 0.92 0.61 - 1.24 mg/dL   Calcium 9.0 8.9 - 10.3 mg/dL   GFR calc non Af Amer >60 >60 mL/min   GFR calc Af Amer >60 >60 mL/min    Comment: (NOTE) The eGFR has been calculated using the CKD EPI equation. This calculation has not been validated in all clinical situations. eGFR's persistently <60 mL/min signify possible Chronic Kidney Disease.    Anion gap 6 5 - 15    Comment: Performed at [facility]  Glucose, capillary     Status: Abnormal   Collection Time: 06/15/16 11:46 AM  Result Value Ref Range   Glucose-Capillary 186 (H) 65 - 99 mg/dL  Glucose, capillary     Status: Abnormal   Collection Time: 06/15/16  5:11 PM  Result Value Ref Range   Glucose-Capillary 158 (H) 65 - 99 mg/dL  Glucose, capillary     Status: Abnormal   Collection Time: 06/15/16  8:38 PM  Result Value Ref Range   Glucose-Capillary 218 (H) 65 - 99 mg/dL   Comment 1 Notify RN    Comment 2 Document in Chart   Glucose, capillary     Status: Abnormal   Collection Time: 06/16/16  6:05 AM  Result Value Ref Range   Glucose-Capillary 108 (H) 65 - 99 mg/dL   Comment 1  Notify RN    Comment 2 Document in Chart     Blood Alcohol level:  Lab Results  Component Value Date   ETH <5 77/41/2878    Metabolic Disorder Labs: Lab  Results  Component Value Date   HGBA1C 11.1 (H) 10/02/2015   MPG 272 10/02/2015   No results found for: PROLACTIN No results found for: CHOL, TRIG, HDL, CHOLHDL, VLDL, LDLCALC  Physical Findings: AIMS: Facial and Oral Movements Muscles of Facial Expression: None, normal Lips and Perioral Area: None, normal Jaw: None, normal Tongue: None, normal,Extremity Movements Upper (arms, wrists, hands, fingers): None, normal Lower (legs, knees, ankles, toes): None, normal, Trunk Movements Neck, shoulders, hips: None, normal, Overall Severity Severity of abnormal movements (highest score from questions above): None, normal Incapacitation due to abnormal movements: None, normal Patient's awareness of abnormal movements (rate only patient's report): No Awareness, Dental Status Current problems with teeth and/or dentures?: No Does patient usually wear dentures?: No  CIWA:  CIWA-Ar Total: 1 COWS:  COWS Total Score: 2  Musculoskeletal: Strength & Muscle Tone: within normal limits Gait & Station: normal Patient leans: N/A  Psychiatric Specialty Exam: Physical Exam  Constitutional: He appears well-developed and well-nourished.    Review of Systems  Cardiovascular: Negative for palpitations.  Gastrointestinal: Negative for nausea.  Skin: Negative for rash.  Psychiatric/Behavioral: Positive for depression. Negative for suicidal ideas.   denies chest pain, no shortness of breath, no nausea or vomiting   Blood pressure 137/81, pulse 82, temperature 98.4 F (36.9 C), temperature source Oral, resp. rate 16, height 5' 11"  (1.803 m), weight 133.8 kg (295 lb).Body mass index is 41.14 kg/m.  General Appearance: Fairly Groomed  Eye Contact:  Good  Speech:  Normal Rate  Volume:  Normal  Mood: reports he feels better  Affect:  Less  constricted, improving  Thought Process:  Linear  Orientation:  Other:  fully alert and attentive   Thought Content:  denies hallucinations , no delusions expressed, not internally preoccupied   Suicidal Thoughts:  No at this time denies any suicidal ideations, denies any self injurious ideations   Homicidal Thoughts:  No denies any homicidal or violent ideations   Memory:  recent and remote grossly intact   Judgement:  Improving   Insight:  Improving   Psychomotor Activity:  Normal  Concentration:  Concentration: Good and Attention Span: Good  Recall:  Good  Fund of Knowledge:  Good  Language:  Good  Akathisia:  Negative  Handed:  Right  AIMS (if indicated):     Assets:  Desire for Improvement Resilience  ADL's:  Intact  Cognition:  WNL  Sleep:  Number of Hours: 5   Assessment  Patient continues to present with improving mood ,Of note, patient 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 He is unsure this exacerbated his depression, takes Nuvigil at home for this, states this should now be less of an issue, because his shifts are going to be extended so he only needs to make one switch a month  .   Treatment Plan Summary: Daily contact with patient to assess and evaluate symptoms and progress in treatment, Medication management, Plan inpatient treatment  and medications as below  Encourage group, milieu participation to work on coping skills and symptom reduction No change in meds for today  Continue Zoloft now at 100  mgrs QDAY for depression and anxiety Continue Trazodone 50 mgrs QHS for insomnia as needed  Continue Metformin/Insulin for DM management  Treatment team working on disposition planning - consider discharge soon as he continues to improve  Possible discharge Dwayne Rocha, Dwayne Hudnall, MD 06/16/2016, 11:27 AM

## 2016-06-16 NOTE — Progress Notes (Signed)
Dwayne Rocha is seen [facility] on the [facility] hall today. He spends much of his time talking on the hall patient phone. He rolls his eyes when this writer asks him to end his phone call due to group starting. [name] completes his daily assessment and on it he writes he denies Suicidal ideation and he rates his depression, hopelessness and anxeity " 0/0/0/", respectively. He refuses to engage in therapeutic conversation with this Clinical research associatewriter...about his discharge plans, about his problems,  and he responds " I'm ready to go home...everything's ok". R Safety in place and writer to cont to gently encourage pt's engagement in his recovery.

## 2016-06-16 NOTE — [facility] Group Notes (Signed)
[facility] Group Notes: (Clinical Social Work)   06/16/2016      Type of Therapy:  Group Therapy   Participation Level:  Did Not Attend despite MHT prompting   Ambrose MantleMareida Grossman-Orr, LCSW 06/16/2016, 3:08 PM

## 2016-06-16 NOTE — [facility] Group Notes (Signed)
[facility] Group Notes:  (Nursing/MHT/Case Management/Adjunct)  Date:  06/16/2016  Time:  10:27 AM  Type of Therapy:  Psychoeducational Skills  Participation Level:  Active  Participation Quality:  Appropriate  Affect:  Appropriate  Cognitive:  Appropriate  Insight:  Appropriate  Engagement in Group:  Engaged  Modes of Intervention:  Discussion  Summary of Progress/Problems: Pt did attend self inventory group.     Pt reported no issues or concerns.   Jacquelyne BalintForrest, Tatanisha Cuthbert Shanta 06/16/2016, 10:27 AM

## 2016-06-17 DIAGNOSIS — F332 Major depressive disorder, recurrent severe without psychotic features: Secondary | ICD-10-CM

## 2016-06-17 LAB — GLUCOSE, CAPILLARY
Glucose-Capillary: 115 mg/dL — ABNORMAL HIGH (ref 65–99)
Glucose-Capillary: 134 mg/dL — ABNORMAL HIGH (ref 65–99)

## 2016-06-17 MED ORDER — TRAZODONE HCL 50 MG PO TABS: 50.0000 mg | ORAL_TABLET | Freq: Every evening | ORAL | 0 refills | Status: DC | PRN

## 2016-06-17 MED ORDER — SERTRALINE HCL 100 MG PO TABS: 100.0000 mg | ORAL_TABLET | Freq: Every day | ORAL | 0 refills | Status: DC

## 2016-06-17 MED ORDER — METFORMIN HCL 1000 MG PO TABS: 1000.0000 mg | ORAL_TABLET | Freq: Two times a day (BID) | ORAL | 0 refills | Status: DC

## 2016-06-17 MED ORDER — INSULIN GLARGINE 100 UNIT/ML ~~LOC~~ SOLN: 8.0000 [IU] | Freq: Every day | SUBCUTANEOUS | 11 refills | Status: DC

## 2016-06-17 NOTE — Tx Team (Signed)
Interdisciplinary Treatment Plan Update (Adult) Date: 06/17/2016    Time Reviewed: 9:30 AM  Progress in Treatment: Attending groups: Intermittently Participating in groups: Minimally Taking medication as prescribed: Yes Tolerating medication: Yes Family/Significant other contact made: No,Pt declines Patient understands diagnosis: Yes Discussing patient identified problems/goals with staff: Yes Medical problems stabilized or resolved: Yes Denies suicidal/homicidal ideation: Yes Issues/concerns per patient self-inventory: Yes Other:  New problem(s) identified: N/A  Discharge Plan or Barriers: Pt will return home and follow-up with outpatient services.   Reason for Continuation of Hospitalization:  Depression Anxiety Medication Stabilization   Comments: N/A  Estimated length of stay: 0 days   Patient is a 40 year old male who presented to the hospital with SI. Primary triggers for admission include family and relationship stressors. Patient will benefit from crisis stabilization, medication evaluation, group therapy and psycho education in addition to case management for discharge planning. At discharge, it is recommended that Pt remain compliant with established discharge plan and continued treatment.   Review of initial/current patient goals per problem list:  1. Goal(s): Patient will have aftercare plan. Met: Yes Target date: 3-5 days post admission date  8/2: Goal not met: CSW assessing for appropriate referrals for pt and will have follow up secured prior to d/c.  8/7: Pt will return home and follow-up with outpatient services.     2. Goal (s): Patient will exhibit decreased depressive symptoms and suicidal ideations.   Met: Adequate for DC   Target date: 3-5 days post admission date   As evidenced by: Patient will utilize self rating of depression at 3 or below and demonstrate decreased signs of depression or be deemed stable for discharge by MD.  8/2: Goal  not met: Pt presents with flat affect and depressed mood.  Pt admitted with high levels of depression. Pt to show decreased sign of depression and a rating of 3 or less before d/c.    8/7: MD feels that Pt's symptoms have decreased to the point that they can be managed in an outpatient setting.    3. Goal(s): Patient will demonstrate decreased signs and symptoms of anxiety.   Met: Adequate for DC   Target date: 3-5 days post admission date   As evidenced by: Patient will utilize self rating of anxiety at 3 or below and demonstrated decreased signs of anxiety, or be deemed stable for discharge by MD  8/2: Goal not met: Pt presents with anxious mood and affect.  Pt admitted with high anxiety levels. Pt to show decreased sign of anxiety and a rating of 3 or less before d/c.  8/7: MD feels that Pt's symptoms have decreased to the point that they can be managed in an outpatient setting.     Attendees: Patient:    Family:    Physician: Dr. Parke Poisson; Dr. Shea Evans 06/12/2016 9:30 AM  Nursing: Marilynne Halsted, RN 06/12/2016 9:30 AM  Clinical Social Worker: Erasmo Downer Drinkard, LCSW 06/12/2016 9:30 AM  Other: Peri Maris, LCSW; [place], LCSW  06/12/2016 9:30 AM  Other:  06/12/2016 9:30 AM  Other: Lars Pinks, Case Manager 06/12/2016 9:30 AM  Other:  06/12/2016 9:30 AM  Other:    Other:      Scribe for Treatment Team:  Peri Maris, [place] Work [phone]

## 2016-06-17 NOTE — [facility] Group Notes (Signed)
[facility]  06/17/2016 8:45 AM  Pt did not attend, declined invitation.   Chad CordialLauren Carter, LCSWA 06/17/2016 10:24 AM

## 2016-06-17 NOTE — Discharge Summary (Signed)
Physician Discharge Summary Note  Patient:  Dwayne Rocha is an 40 y.o., male MRN:  [id number] DOB:  Apr 16, 1976 Patient phone:  [phone] (home)  Patient address:   [place] [address],  Total Time spent with patient: 30 minutes  Date of Admission:  06/10/2016 Date of Discharge: 06/17/2016  Reason for Admission: PER H&P History of Present Illness:  40 year old male with history of depression, anxiety presented to ED with SI with plan to running his car into a tree or taking an overdose of pills. He states that he has been stressed and things "piled up" for two years. His ex-wife moved to [place] taking his children without notice last Nov. There would be a court due to this issues. 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 He also reports recent discordance with his girlfriend. There is worsening in his depression ans SI since then; his girlfriend recommended him to be evaluated at the hospital. He feels better since admission and denies current SI. He still feels overwhelmed thinking about his stressors. He was first diagnosed anxiety stress disorder in 2013 when he was started on Paxil. He sees little benefit from this medication. No previous suicide attempt. No previous psychiatry admission. He used to take Adderal once a week/Nuvigil every day depending on his shift. He has not taken Ativan for two years.   Principal Problem: MDD (major depressive disorder) [facility]) Discharge Diagnoses: Patient Active Problem List   Diagnosis Date Noted  . Severe episode of recurrent major depressive disorder, without psychotic features ([place]) [F33.2]   . MDD (major depressive disorder) ([place]) [F32.9] 06/10/2016  . DM (diabetes mellitus), type 2, uncontrolled ([place]) [E11.65] 10/02/2015  . Facial cellulitis [H70.263] 09/30/2015  . Anxiety [F41.9] 09/30/2015  . HTN (hypertension) [I10] 09/30/2015    Past Psychiatric History: See Above  Past Medical History:   Past Medical History:  Diagnosis Date  . Anxiety   . Diabetes mellitus   . Hypertension     Past Surgical History:  Procedure Laterality Date  . HIP SURGERY     "pins in hips"-"growth plate slipped"  . WRIST SURGERY     Family History: History reviewed. No pertinent family history. Family Psychiatric  History: See H&P Social History:  History  Alcohol Use  . Yes    Comment: about 6x a year     History  Drug Use No    Social History   Social History  . Marital status: Married    Spouse name: N/A  . Number of children: N/A  . Years of education: N/A   Social History Main Topics  . Smoking status: Current Every Day Smoker    Packs/day: 0.50    Years: 12.00    Types: Cigarettes  . Smokeless tobacco: Current User  . Alcohol use Yes     Comment: about 6x a year  . Drug use: No  . Sexual activity: Yes   Other Topics Concern  . None   Social History Narrative  . None    Hospital Course: Dwayne Rocha was admitted for MDD (major depressive disorder) ([place]) and crisis management.  Pt was treated discharged with the medications listed below under Medication List.  Medical problems were identified and treated as needed.  Home medications were restarted as appropriate.  Improvement was monitored by observation and Dwayne Rocha 's daily report of symptom reduction.  Emotional and mental status was monitored by daily self-inventory reports completed by Gwyndolyn Saxon  Kaminsky and clinical staff.         Dwayne Rocha was evaluated by the treatment team for stability and plans for continued recovery upon discharge. Dwayne Rocha 's motivation was an integral factor for scheduling further treatment. Employment, transportation, bed availability, health status, family support, and any pending legal issues were also considered during hospital stay. Pt was offered further treatment options upon discharge including but not limited to Residential, Intensive Outpatient, and Outpatient  treatment.  Dwayne Rocha will follow up with the services as listed below under Follow Up Information.     Upon completion of this admission the patient was both mentally and medically stable for discharge denying suicidal/homicidal ideation, auditory/visual/tactile hallucinations, delusional thoughts and paranoia.    Dwayne Rocha responded well to treatment with Zoloft 100 mg and trazodone 50 mg , without adverse effects.. Pt demonstrated improvement without reported or observed adverse effects to the point of stability appropriate for outpatient management. Pertinent labs include: CMP, for which outpatient follow-up is necessary for lab recheck as mentioned below. Reviewed CBC, CMP, BAL, and UDS; all unremarkable aside from noted exceptions.   Physical Findings: AIMS: Facial and Oral Movements Muscles of Facial Expression: None, normal Lips and Perioral Area: None, normal Jaw: None, normal Tongue: None, normal,Extremity Movements Upper (arms, wrists, hands, fingers): None, normal Lower (legs, knees, ankles, toes): None, normal, Trunk Movements Neck, shoulders, hips: None, normal, Overall Severity Severity of abnormal movements (highest score from questions above): None, normal Incapacitation due to abnormal movements: None, normal Patient's awareness of abnormal movements (rate only patient's report): No Awareness, Dental Status Current problems with teeth and/or dentures?: No Does patient usually wear dentures?: No  CIWA:  CIWA-Ar Total: 1 COWS:  COWS Total Score: 2  Musculoskeletal: Strength & Muscle Tone: within normal limits Gait & Station: normal Patient leans: N/A  Psychiatric Specialty Exam: See SRA by MD Physical Exam  Nursing note reviewed. Constitutional: He is oriented to person, place, and time. He appears well-developed.  HENT:  Head: Normocephalic.  Neck: Normal range of motion.  Cardiovascular: Normal rate.   Musculoskeletal: Normal range of motion.   Neurological: He is alert and oriented to person, place, and time.  Skin: Skin is warm and dry.  Psychiatric: He has a normal mood and affect. His behavior is normal.    Review of Systems  Psychiatric/Behavioral: Negative for hallucinations and suicidal ideas. Depression: stable. Insomnia: stable.     Blood pressure 137/81, pulse 82, temperature 98.4 F (36.9 C), temperature source Oral, resp. rate 16, height 5' 11"  (1.803 m), weight 133.8 kg (295 lb).Body mass index is 41.14 kg/m.   Have you used any form of tobacco in the last 30 days? (Cigarettes, Smokeless Tobacco, Cigars, and/or Pipes): Yes  Has this patient used any form of tobacco in the last 30 days? (Cigarettes, Smokeless Tobacco, Cigars, and/or Pipes) Yes, Yes, A prescription for an FDA-approved tobacco cessation medication was offered at discharge and the patient refused  Blood Alcohol level:  Lab Results  Component Value Date   ETH <5 85/27/7824    Metabolic Disorder Labs:  Lab Results  Component Value Date   HGBA1C 11.1 (H) 10/02/2015   MPG 272 10/02/2015   No results found for: PROLACTIN No results found for: CHOL, TRIG, HDL, CHOLHDL, VLDL, LDLCALC  See Psychiatric Specialty Exam and Suicide Risk Assessment completed by Attending Physician prior to discharge.  Discharge destination:  Home  Is patient on multiple antipsychotic therapies at discharge:  No   Has Patient  had three or more failed trials of antipsychotic monotherapy by history:  No  Recommended Plan for Multiple Antipsychotic Therapies: NA  Discharge Instructions    Diet - low sodium heart healthy    Complete by:  As directed   Discharge instructions    Complete by:  As directed   Take all medications as prescribed. Keep all follow-up appointments as scheduled.  Do not consume alcohol or use illegal drugs while on prescription medications. Report any adverse effects from your medications to your primary care provider promptly.  In the event of  recurrent symptoms or worsening symptoms, call 911, a crisis hotline, or go to the nearest emergency department for evaluation.   Increase activity slowly    Complete by:  As directed       Medication List    STOP taking these medications   amphetamine-dextroamphetamine 30 MG tablet Commonly known as:  ADDERALL   clindamycin 300 MG capsule Commonly known as:  CLEOCIN   fluconazole 150 MG tablet Commonly known as:  DIFLUCAN   glucose monitoring kit monitoring kit   Insulin Pen Needle 32G X 8 MM Misc   LORazepam 1 MG tablet Commonly known as:  ATIVAN   NUVIGIL 250 MG tablet Generic drug:  Armodafinil   oxyCODONE 5 MG immediate release tablet Commonly known as:  Oxy IR/ROXICODONE   PARoxetine 40 MG tablet Commonly known as:  PAXIL     TAKE these medications     Indication  insulin glargine 100 UNIT/ML injection Commonly known as:  LANTUS Inject 0.08 mLs (8 Units total) into the skin at bedtime.  Indication:  Type 2 Diabetes   metFORMIN 1000 MG tablet Commonly known as:  GLUCOPHAGE Take 1 tablet (1,000 mg total) by mouth 2 (two) times daily with a meal.  Indication:  Type 2 Diabetes   sertraline 100 MG tablet Commonly known as:  ZOLOFT Take 1 tablet (100 mg total) by mouth daily.  Indication:  Major Depressive Disorder   traZODone 50 MG tablet Commonly known as:  DESYREL Take 1 tablet (50 mg total) by mouth at bedtime as needed for sleep.  Indication:  Trouble Sleeping      Follow-up Information    Derrel Nip, MD Follow up on 07/30/2016.   Specialty:  Psychiatry Why:  Medications management appointment w Dr Toy Care on 8/19 at 4:45 PM.  Provider will also keep your current appt on 9/11.  Bring hospital discharge paperwork to this appointment.   Contact information: [address] [place] [address] [phone]        [place]. Go on 07/26/2016.   Why:  PLEASE ARRIVE AT 1:45PM TO FILL OUT PAPERWORK AND  BRING A PHOTO ID AND YOU INSURANCE CARD Contact information: [address] [place], [place] [address] [phone]          Follow-up recommendations:  Activity:  aas tolerated Diet:  heart healthy  Comments: Take all medications as prescribed. Keep all follow-up appointments as scheduled.  Do not consume alcohol or use illegal drugs while on prescription medications. Report any adverse effects from your medications to your primary care provider promptly.  In the event of recurrent symptoms or worsening symptoms, call 911, a crisis hotline, or go to the nearest emergency department for evaluation.   Signed: Derrill Center, NP 06/17/2016, 2:00 PM   Patient seen, Suicide Assessment Completed.  Disposition Plan Reviewed

## 2016-06-17 NOTE — Progress Notes (Signed)
Patient discharged per physician order; patient denies SI/HI and A/V hallucinations; patient received prescriptions,  AVS,suicide risk assessment note, and transition record given to the patient after it was reviewed; patient had no other questions or concerns at this time; patient verbalized and signed that all belongings were returned; patient left the unit ambulatory 

## 2016-06-17 NOTE — Progress Notes (Signed)
  [facility] :  Will you be returning to the same living situation after discharge:  Yes,  Pt will return home At discharge, do you have transportation home?: Yes,  Pt family member to pick up Do you have the ability to pay for your medications: Yes,  Pt provided with prescriptions  Release of information consent forms completed and in the chart;  Patient's signature needed at discharge.  Patient to Follow up at: Follow-up Information    Glori BickersKAUR,RUPINDER DHAMI, MD Follow up on 07/30/2016.   Specialty:  Psychiatry Why:  Medications management appointment w Dr Evelene CroonKaur on 8/19 at 4:45 PM.  Provider will also keep your current appt on 9/11.  Bring hospital discharge paperwork to this appointment.   Contact information: [address] [address] [place] [address] [phone]        [facility]. Go on 07/26/2016.   Why:  PLEASE ARRIVE AT 1:45PM TO FILL OUT PAPERWORK AND BRING A PHOTO ID AND YOU INSURANCE CARD Contact information: [address] [place] [address] [phone]          Next level of care provider [facility] Link:no  Safety Planning and Suicide Prevention discussed: Yes,  with Pt; declines family contact  Have you used any form of tobacco in the last 30 days? (Cigarettes, Smokeless Tobacco, Cigars, and/or Pipes): Yes  Has patient been referred to the Quitline?: Patient refused referral  Patient has been referred for addiction treatment: Yes  Elaina HoopsCarter, Kahlie Deutscher M 06/17/2016, 1:13 PM

## 2016-06-17 NOTE — Progress Notes (Signed)
D: Pt denies SI/HI/AVH. Pt forwards little information.   A: Pt was offered support and encouragement. Pt was given scheduled medications. Pt was encourage to attend groups. Q 15 minute checks were done for safety.   R:Pt attends groups and interacts well with peers and staff. Pt is taking medication. Pt has no complaints.Pt receptive to treatment and safety maintained on unit.

## 2016-06-17 NOTE — [facility] Suicide Risk Assessment (Signed)
[facility]   Principal Problem: MDD (major depressive disorder) [facility](H[facility]) Discharge Diagnoses:  Patient Active Problem List   Diagnosis Date Noted  . MDD (major depressive disorder) ([facility]) [F32.9] 06/10/2016  . DM (diabetes mellitus), type 2, uncontrolled ([facility]) [E11.65] 10/02/2015  . Facial cellulitis [L03.211] 09/30/2015  . Anxiety [F41.9] 09/30/2015  . HTN (hypertension) [I10] 09/30/2015    Total Time spent with patient: 30 minutes  Musculoskeletal: Strength & Muscle Tone: within normal limits Gait & Station: normal Patient leans: N/A  Psychiatric Specialty Exam: ROS denies headache, denies chest pain, no shortness of breath, no nausea, no vomiting   Blood pressure 137/81, pulse 82, temperature 98.4 F (36.9 C), temperature source Oral, resp. rate 16, height 5\' 11"  (1.803 m), weight 295 lb (133.8 kg).Body mass index is 41.14 kg/m.  General Appearance: improved grooming   Eye Contact::  Good  Speech:  Normal Rate409  Volume:  Normal  Mood:  improved, currently denies feeling depressed   Affect:  Appropriate  Thought Process:  Linear  Orientation:  Full (Time, Place, and Person)  Thought Content:  no hallucinations, no delusions  Suicidal Thoughts:  No denies any suicidal ideations, denies any self injurious ideations   Homicidal Thoughts:  No denies any violent or homicidal ideations  Memory:  recent and remote grossly intact   Judgement:  Other:  improved   Insight:  improved   Psychomotor Activity:  Normal  Concentration:  Good  Recall:  Good  Fund of Knowledge:Good  Language: Good  Akathisia:  Negative  Handed:  Right  AIMS (if indicated):     Assets:  Communication Skills Desire for Improvement Resilience  Sleep:  Number of Hours: 5.5  Cognition: WNL  ADL's:  Intact   Mental Status Per Nursing Assessment::   On Admission:     Demographic Factors:  40 year old , divorced, employed, lives with friend  Loss Factors: Divorce, ex  wife and children moved out of state late last year , court /legal issues regarding custody battle, recent relationship with GF   Historical Factors: History of depression, anxiety, no prior history of suicide attempts   Risk Reduction Factors:   Employed, Living with another person, especially a relative, Positive social support and Positive coping skills or problem solving skills  Continued Clinical Symptoms:  At this time patient is alert, attentive,well related, pleasant, calm, mood improved and currently euthymic, affect appropriate, no thought disorder, no suicidal ideations, no homicidal ideations, no psychotic symptoms, future oriented . Denies medication side effects  Cognitive Features That Contribute To Risk:  No gross cognitive deficits noted upon discharge. Is alert , attentive, and oriented x 3   Suicide Risk:  Mild:  Suicidal ideation of limited frequency, intensity, duration, and specificity.  There are no identifiable plans, no associated intent, mild dysphoria and related symptoms, good self-control (both objective and subjective assessment), few other risk factors, and identifiable protective factors, including available and accessible social support.  Follow-up Information    Glori BickersKAUR,RUPINDER DHAMI, MD Follow up on 07/30/2016.   Specialty:  Psychiatry Why:  Medications management appointment w Dr Evelene CroonKaur on 8/19 at 4:45 PM.  Provider will also keep your current appt on 9/11.  Bring hospital discharge paperwork to this appointment.   Contact information: [address] [address] [place] [address] [phone]        [facility]. Go on 07/26/2016.   Why:  PLEASE ARRIVE AT 1:45PM TO FILL OUT PAPERWORK AND  BRING A PHOTO ID AND YOU INSURANCE CARD Contact information: [address] [place], [place] [address] [phone]          Plan Of Care/Follow-up recommendations:  Activity:  as tolerated  Diet:  Diabetic Diet  Tests:   NA Other:  see below   Patient is leaving unit in good spirits. Plans to return home . Plans to follow up as above .   Nehemiah Massed, MD 06/17/2016, 12:59 PM

## 2016-06-17 NOTE — Progress Notes (Signed)
Recreation Therapy Notes  Date: 06/17/16 Time: 0930 Location: [address] Group Room  Group Topic: Stress Management  Goal Area(s) Addresses:  Patient will verbalize importance of using healthy stress management.  Patient will identify positive emotions associated with healthy stress management.   Intervention: Stress Management  Activity :  Forest Visualization.  LRT introduced pt to the technique of guided imagery.  Patients were to follow along as LRT read script to engage in the activity.   Education:  Stress Management, Discharge Planning.   Clinical Observations/Feedback: Pt did not attend group.     Antwanette Wesche, LRT/CTRS  

## 2016-07-01 ENCOUNTER — Other Ambulatory Visit (HOSPITAL_COMMUNITY): Payer: 59 | Attending: Psychiatry | Admitting: Psychiatry

## 2016-07-01 ENCOUNTER — Encounter (HOSPITAL_COMMUNITY): Payer: Self-pay | Admitting: Psychiatry

## 2016-07-01 DIAGNOSIS — I10 Essential (primary) hypertension: Secondary | ICD-10-CM | POA: Diagnosis not present

## 2016-07-01 DIAGNOSIS — F411 Generalized anxiety disorder: Secondary | ICD-10-CM | POA: Diagnosis not present

## 2016-07-01 DIAGNOSIS — G4700 Insomnia, unspecified: Secondary | ICD-10-CM | POA: Insufficient documentation

## 2016-07-01 DIAGNOSIS — Z794 Long term (current) use of insulin: Secondary | ICD-10-CM | POA: Diagnosis not present

## 2016-07-01 DIAGNOSIS — F17210 Nicotine dependence, cigarettes, uncomplicated: Secondary | ICD-10-CM | POA: Insufficient documentation

## 2016-07-01 DIAGNOSIS — F331 Major depressive disorder, recurrent, moderate: Secondary | ICD-10-CM | POA: Diagnosis present

## 2016-07-01 DIAGNOSIS — E119 Type 2 diabetes mellitus without complications: Secondary | ICD-10-CM | POA: Diagnosis not present

## 2016-07-01 NOTE — Progress Notes (Signed)
[name] is a 40 y.o. male patient who was referred per Dr. Carie CaddyKaur's office.  [facility]'s [facility] was still open from 06-10-16 admission [facility]).  She insisted that [name] [phone]) be called with d/c information or pt's stay will not be covered. A:  [facility] and left d/c clinical on her voicemail.  Left writer's name and phone #.  Informed inpt [facility] case management team.        Chestine SporeLARK, RITA, M.Ed, CNA

## 2016-07-01 NOTE — Progress Notes (Signed)
Comprehensive Clinical Assessment (CCA) Note  07/01/2016 Dwayne Rocha [id number]  Visit Diagnosis:   No diagnosis found.    CCA Part One  Part One has been completed on paper by the patient.  (See scanned document in Chart Review)  CCA Part Two A  Intake/Chief Complaint:  CCA Intake With Chief Complaint CCA Part Two Date: 07/01/16 CCA Part Two Time: 1118 Chief Complaint/Presenting Problem: This is a 40 yr old, divorced, employed, [place] American male, who was referred per Dr. Toy Care; treatment for worsening depressive and anxiety symptoms.  Denies SI/HI or A/V hallucinations.  Reports his symptoms worsened last month.  Multiple stressors:  1)  Court Proceedings:  Ex-wife moved to [place] taking his children without notice last November 2016.  She has full custody.  Pt states he gave her full custody because he was in a relationship with a former lady who had "a sketchy past."  "I didn't want all that to come up in court, nor did I want my kids to be dragged in court."  Pt states he ended that relationship Nov. 2016.  According to pt, ex-wife left the state without notifing the court system; which is in the parenting plan.  "She took them to [facility] where her boyfriend's family is.  She supposedly went there for an emergency and now she's claiming to have a tumor."  Pt has only talked to his three children three times since she left.  He pays $1,100 a mo in child support.  2)  Job ([employer]) of nine yrs.  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  3)  Medical:  Diabetes.  4)  Housing:  In Oct. 2016, moved in with best friend and his family.  States he sleeps on their couch.  Pt reports a prior recent admission at [facility] (June 10, 2016 thru 06-17-16).  Was admitted due to depression with SI (plan:  MVA or OD).  Has been seeing Dr. Toy Care for three years.  No current therapist; but had seen Lucita Ferrara, LCSW in the past.  No prior suicide attempts or gestures.  Denies family hx of mental illness.                                                                                                                    Patients Currently Reported Symptoms/Problems: Sadness, Isolative, tearful, poor sleep, anxious, irritable Collateral Involvement: Sister and best friend is supportive. Individual's Strengths: Pt is motivated for treatment. Individual's Abilities: Has a sense of humor.  Mental Health Symptoms Depression:  Depression: Change in energy/activity, Irritability, Sleep (too much or little), Tearfulness, Worthlessness  Mania:  Mania: N/A  Anxiety:   Anxiety: Worrying, Restlessness  Psychosis:  Psychosis: N/A  Trauma:  Trauma: N/A  Obsessions:  Obsessions: N/A  Compulsions:  Compulsions: N/A  Inattention:     Hyperactivity/Impulsivity:  Oppositional/Defiant Behaviors:  Oppositional/Defiant Behaviors: N/A  Borderline Personality:     Other Mood/Personality Symptoms:      Mental Status Exam Appearance and self-care  Stature:  Stature: Tall  Weight:  Weight: Average weight  Clothing:  Clothing: Casual  Grooming:  Grooming: Well-groomed  Cosmetic use:  Cosmetic Use: None  Posture/gait:  Posture/Gait: Normal  Motor activity:  Motor Activity: Not Remarkable  Sensorium  Attention:  Attention: Normal  Concentration:  Concentration: Normal  Orientation:  Orientation: X5  Recall/memory:  Recall/Memory: Normal  Affect and Mood  Affect:  Affect: Blunted  Mood:  Mood: Depressed  Relating  Eye contact:  Eye Contact: Normal  Facial expression:  Facial Expression: Sad  Attitude toward examiner:  Attitude Toward Examiner: Cooperative  Thought and Language  Speech flow: Speech Flow: Normal  Thought content:  Thought Content: Appropriate to mood and circumstances  Preoccupation:     Hallucinations:     Organization:     Transport planner of Knowledge:  Fund of Knowledge:  Average  Intelligence:  Intelligence: Average  Abstraction:  Abstraction: Normal  Judgement:  Judgement: Normal  Reality Testing:  Reality Testing: Adequate  Insight:  Insight: Good  Decision Making:  Decision Making: Normal  Social Functioning  Social Maturity:  Social Maturity: Isolates  Social Judgement:  Social Judgement: Normal  Stress  Stressors:  Stressors: Family conflict, Grief/losses, Money  Coping Ability:  Coping Ability: English as a second language teacher Deficits:     Supports:      Family and Psychosocial History: Family history Marital status: Divorced Divorced, when?: Separated for two yrs.  Divorced Aug. 2016 (Was married nine yrs.  Her second marriage, his first marriage.  Reports she had multiple affairs.) What types of issues is patient dealing with in the relationship?: ex wife is hard of hearing, moved to [facility] w 3 children, pt in court trying to get children returned to [facility], per pt, legal struggles have been substantial Additional relationship information: Pt been dating a male he met on Facebook since Feb. 2017.  She resides in [place]. Are you sexually active?: Yes What is your sexual orientation?: heterosexual Has your sexual activity been affected by drugs, alcohol, medication, or emotional stress?: no Does patient have children?: Yes How many children?: 3 How is patient's relationship with their children?: close to children ages 109, 36, 72 - frustrated that they were moved to [facility]  Childhood History:  Childhood History By whom was/is the patient raised?: Both parents Additional childhood history information: Born in [place], [place].  Raised by both parents.  Both worked (one on first the other at night).  States he and siblings were raised by older sister.  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                                        Description of patient's relationship with  caregiver when they were a child: good, normal childhood Patient's description of current relationship with people who raised him/her: doesnt see much of mother and father who live in [place], mother has early dementia How were you disciplined when you got in trouble as a child/adolescent?: "I didnt get in trouble, I knew what would happen." Does patient have siblings?: Yes Number of Siblings: 2 Description of patient's current  relationship with siblings: brother and sister in [place], brother works at [employer] and [employer] pt, supportive.  Text sister everyday.  Older brother resides in [place]. 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, theatre/television/film, Teacher, English as a foreign language How long has patient been employed?: 9 years Patient's job has been impacted by current illness: Yes Describe how patient's job has been impacted: concerned about Medical sales representative about current hospitalization; does not have his # What is the longest time patient has a held a job?: 9 years Where was the patient employed at that time?: same as above Has patient ever been in the [employer]?: No Has patient ever served in combat?: No Did You Receive Any Psychiatric Treatment/Services While in Passenger transport manager?: No Are There Guns or Other Weapons in [place]?: No Are These Weapons Safely Secured?: Yes (n/a)  Education: Education Did Teacher, adult education From [employer]?: Yes Did Physicist, medical?: No Did Heritage manager?: No Did You Have An Individualized  Education Program (IIEP): No Did You Have Any Difficulty At [employer]?: No  Religion: Religion/Spirituality Are You A Religious Person?: No  Leisure/Recreation: Leisure / Recreation Leisure and Hobbies: "I work all the time", enjoys bowling, shooting pool, movies, Web designer, Management consultant  Exercise/Diet: Exercise/Diet Do You Exercise?: No Have You Gained or Lost A Significant Amount of Weight in the Past Six Months?: No Do You Follow a Special Diet?: Yes (Diabetic) Type of Diet: Diabetic Do You Have Any Trouble Sleeping?: Yes Explanation of Sleeping Difficulties: Difficulty falling asleep  CCA Part Two C  Alcohol/Drug Use: Alcohol / Drug Use Pain Medications: Denies Prescriptions: Denies Over the Counter: Denies History of alcohol / drug use?: No history of alcohol / drug abuse                      CCA Part Three  ASAM's:  Six Dimensions of Multidimensional Assessment  Dimension 1:  Acute Intoxication and/or Withdrawal Potential:     Dimension 2:  Biomedical Conditions and Complications:     Dimension 3:  Emotional, Behavioral, or Cognitive Conditions and Complications:     Dimension 4:  Readiness to Change:     Dimension 5:  Relapse, Continued use, or Continued Problem Potential:     Dimension 6:  Recovery/Living Environment:      Substance use Disorder (SUD)    Social Function:  Social Functioning Social Maturity: Isolates Social Judgement: Normal  Stress:  Stress Stressors: Family conflict, Grief/losses, Money Coping Ability: Overwhelmed Patient Takes Medications The Way The Doctor Instructed?: Yes Priority Risk: Moderate Risk  Risk Assessment- Self-Harm Potential: Risk Assessment For Self-Harm Potential Thoughts of Self-Harm: No current thoughts Method: No plan Availability of Means: No access/NA  Risk Assessment -Dangerous to Others Potential: Risk Assessment For Dangerous to Others Potential Method: No Plan Availability of Means: No  access or NA Intent: Vague intent or NA (n/a) Notification Required: No need or identified person  DSM5 Diagnoses: Patient Active Problem List   Diagnosis Date Noted  . Severe episode of recurrent major depressive disorder, without psychotic features ([place])   .  MDD (major depressive disorder) ([place]) 06/10/2016  . DM (diabetes mellitus), type 2, uncontrolled ([place]) 10/02/2015  . Facial cellulitis 09/30/2015  . Anxiety 09/30/2015  . HTN (hypertension) 09/30/2015    Patient Centered Plan: Patient is on the following Treatment Plan(s):  Anxiety and Depression  Recommendations for Services/Supports/Treatments: Recommendations for Services/Supports/Treatments Recommendations For Services/Supports/Treatments: IOP (Intensive Outpatient Program)  Treatment Plan Summary:  Pt will attend MH-IOP on a daily basis to learn effective coping skills.  Encouraged support groups.  Strongly recommend aftercare group with Charolotte Eke, LCAS once pt completes MH-IOP.  F/U with Dr. Toy Care.  Refer pt to a therapist.  Referrals to Alternative Service(s): Referred to Alternative Service(s):   Place:   Date:   Time:    Referred to Alternative Service(s):   Place:   Date:   Time:    Referred to Alternative Service(s):   Place:   Date:   Time:    Referred to Alternative Service(s):   Place:   Date:   Time:     Dellia Nims, M,Ed, CNA

## 2016-07-02 ENCOUNTER — Other Ambulatory Visit (HOSPITAL_COMMUNITY): Payer: 59 | Admitting: Psychiatry

## 2016-07-02 ENCOUNTER — Encounter (HOSPITAL_COMMUNITY): Payer: Self-pay | Admitting: Licensed Clinical Social Worker

## 2016-07-02 DIAGNOSIS — F332 Major depressive disorder, recurrent severe without psychotic features: Secondary | ICD-10-CM

## 2016-07-02 DIAGNOSIS — F331 Major depressive disorder, recurrent, moderate: Secondary | ICD-10-CM

## 2016-07-02 DIAGNOSIS — F411 Generalized anxiety disorder: Secondary | ICD-10-CM

## 2016-07-02 NOTE — Progress Notes (Signed)
Psychiatric Initial Adult Assessment   Patient Identification: Dwayne Rocha MRN:  [id number] Date of Evaluation:  07/02/2016 Referral Source: Dr Evelene CroonKaur Chief Complaint:   Chief Complaint    Depression; Anxiety     Visit Diagnosis:    ICD-9-CM ICD-10-CM   1. Generalized anxiety disorder 300.02 F41.1   2. Severe episode of recurrent major depressive disorder, without psychotic features (HCC) 296.33 F33.2   3. Depression, major, recurrent, moderate (HCC) 296.32 F33.1     History of Present Illness:  Mr Dwayne Rocha says he got depressed to the point of needing admission worrying about access to his children.  He had given up custody to his former wife trying to avoid court battles and trusting her to let him see the kids as often as he wished.  At first things worked well but then she moved to [place] against the court instructions without telling anyone.  Now he has no access to them and is going to court to hold her accountable but it is looking as if that is not going to happen.  While inpatient he worked on being able to cope regardless of the outcome and still needs more help in accepting a bad outcome if that happens.  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  Finances are an issue in that he pays significant child support without being able to see his children when he gets involved with someone else there is not enough money to feel comfortable.  His Nuvigil was stopped in the hospital and that led to an anxiety attack when he returned to work and subsequently to IOP.  Associated Signs/Symptoms: Depression Symptoms:  depressed mood, insomnia, fatigue, difficulty concentrating, anxiety, (Hypo) Manic Symptoms:  Irritable Mood, Anxiety Symptoms:  Excessive Worry, Psychotic Symptoms:  none PTSD Symptoms: Negative  Past Psychiatric History: depressed for years, medication and therapy have  been helpful in the past  Previous Psychotropic Medications: Yes   Substance Abuse History in the last 12 months:  No.  Consequences of Substance Abuse: Negative  Past Medical History:  Past Medical History:  Diagnosis Date  . Anxiety   . Depression   . Diabetes mellitus   . Hypertension     Past Surgical History:  Procedure Laterality Date  . HIP SURGERY     "pins in hips"-"growth plate slipped"  . WRIST SURGERY      Family Psychiatric History: none of relevance  Family History: No family history on file.  Social History:   Social History   Social History  . Marital status: Married    Spouse name: N/A  . Number of children: N/A  . Years of education: N/A   Social History Main Topics  . Smoking status: Current Every Day Smoker    Packs/day: 0.50    Years: 12.00    Types: Cigarettes  . Smokeless tobacco: Current User  . Alcohol use Yes     Comment: about 6x a year  . Drug use: No  . Sexual activity: Yes   Other Topics Concern  . None   Social History Narrative  . None    Additional Social History: none  Allergies:  No Known Allergies  Metabolic Disorder Labs: Lab Results  Component Value Date   HGBA1C 11.1 (H) 10/02/2015   MPG 272 10/02/2015   No results found for: PROLACTIN No results found for: CHOL, TRIG, HDL, CHOLHDL, VLDL, LDLCALC  Current Medications: Current Outpatient Prescriptions  Medication Sig Dispense Refill  . insulin glargine (LANTUS) 100 UNIT/ML injection Inject 0.08 mLs (8 Units total) into the skin at bedtime. 10 mL 11  . metFORMIN (GLUCOPHAGE) 1000 MG tablet Take 1 tablet (1,000 mg total) by mouth 2 (two) times daily with a meal. 60 tablet 0  . sertraline (ZOLOFT) 100 MG tablet Take 1 tablet (100 mg total) by mouth daily. 31 tablet 0   No current facility-administered medications for this visit.     Neurologic: Headache: Negative Seizure: Negative Paresthesias:Negative  Musculoskeletal: Strength & Muscle Tone:  within normal limits Gait & Station: normal Patient leans: N/A  Psychiatric Specialty Exam: ROS  There were no vitals taken for this visit.There is no height or weight on file to calculate BMI.  General Appearance: Well Groomed  Eye Contact:  Good  Speech:  Clear and Coherent  Volume:  Normal  Mood:  Depressed  Affect:  Appropriate  Thought Process:  Coherent  Orientation:  Full (Time, Place, and Person)  Thought Content:  Logical  Suicidal Thoughts:  No  Homicidal Thoughts:  No  Memory:  Immediate;   Good Recent;   Good Remote;   Good  Judgement:  Intact  Insight:  Fair  Psychomotor Activity:  Normal  Concentration:  Concentration: Good and Attention Span: Good  Recall:  Good  Fund of Knowledge:Good  Language: Good  Akathisia:  Negative  Handed:  Right  AIMS (if indicated):  0  Assets:  Communication Skills Desire for Improvement Financial Resources/Insurance Housing Leisure Time Physical Health Resilience Social Support Talents/Skills Transportation Vocational/Educational  ADL's:  Intact  Cognition: WNL  Sleep:  insomnia    Treatment Plan Summary: Continue current medications.  Begin daily group therapy   Carolanne GrumblingGerald Taylor, MD 8/22/20173:04 PM

## 2016-07-02 NOTE — Progress Notes (Signed)
Daily Group Progress Note  Program: IOP     Group Time: 10:45-12:00pm  Participation Level: Active  Behavioral Response: Appropriate  Type of Therapy:  Psychotherapy/Process   Summary of Progress:  Pt was not in group most of the timeparticipated in a discussion on boundaries. Pt was encouraged to use non-permeable boundaries when dealing with problematic people, including family. Pt was receptive to the psychotherapy and suggestions on boundaries.      Ottis StainLisbeth Shaelin Lalley, LCAS-A

## 2016-07-03 ENCOUNTER — Other Ambulatory Visit (HOSPITAL_COMMUNITY): Payer: 59 | Admitting: Psychiatry

## 2016-07-03 DIAGNOSIS — F332 Major depressive disorder, recurrent severe without psychotic features: Secondary | ICD-10-CM

## 2016-07-03 DIAGNOSIS — F331 Major depressive disorder, recurrent, moderate: Secondary | ICD-10-CM | POA: Diagnosis not present

## 2016-07-03 DIAGNOSIS — F411 Generalized anxiety disorder: Secondary | ICD-10-CM

## 2016-07-03 NOTE — Progress Notes (Signed)
    Daily Group Progress Note  Program: IOP  Group Time: 9:00-10:45   Participation Level:  minimal   Behavioral Response:  responsive   Type of Therapy:   group therapy   Summary of Progress: Client appeared very agitated and anxious.  His leg shock back and forth to the point of him having to control it with his hand.  The client discussed his panic attacks and the stress work causes.  He shared about his previous experience in counseling, but felt it was only helpful due to it providing a break in his daily routine.  The counselor encouraged him to be open to the coping skills taught in the group, and to trying them on a daily basis in order for the skills to be helpful.  The client was seen by the psychiatrist.     Shaune PollackBrown, Devanie Galanti B, [facility]

## 2016-07-04 ENCOUNTER — Other Ambulatory Visit (HOSPITAL_COMMUNITY): Payer: 59 | Admitting: Psychiatry

## 2016-07-04 DIAGNOSIS — F332 Major depressive disorder, recurrent severe without psychotic features: Secondary | ICD-10-CM

## 2016-07-04 DIAGNOSIS — F331 Major depressive disorder, recurrent, moderate: Secondary | ICD-10-CM | POA: Diagnosis not present

## 2016-07-04 NOTE — Progress Notes (Signed)
Daily Group Progress Note Program: IOP   Group Time: 10:45-12:00pm  Participation Level: Active  Behavioral Response: Appropriate  Type of Therapy:  Psychoeducation  Summary of Progress:  Pt participated in a discussion; "How to train your brain to go positive instead of negative." Pt was encouraged to build a positive circuitry in her brain by training the brain to look for the positive, instead of the way the brain was trained to look for the negative. Pt was given an activity to help build the positive circuitry in her brain.   Vernona RiegerLisbeth S. Mackenzie, LCAS-A

## 2016-07-05 ENCOUNTER — Other Ambulatory Visit (HOSPITAL_COMMUNITY): Payer: 59 | Admitting: Psychiatry

## 2016-07-05 DIAGNOSIS — F332 Major depressive disorder, recurrent severe without psychotic features: Secondary | ICD-10-CM

## 2016-07-05 DIAGNOSIS — F331 Major depressive disorder, recurrent, moderate: Secondary | ICD-10-CM | POA: Diagnosis not present

## 2016-07-05 NOTE — Progress Notes (Signed)
    Daily Group Progress Note  Program: IOP  Group Time: 9:00-12:00   Participation Level:  minimal   Behavioral Response:  flat   Type of Therapy:  group therapy   Summary of Progress: Client was mostly silence and largely unresponsive when addressed directly in group. One theme he identified with was the idea that when we deny our feelings to spare others discomfort this leads to an emotional disconnect from self. When asked what he hopes to gain from therapy the client responded that this was a good question, but remained otherwise silent.   Shaune PollackBrown, Jennifer B, LPC

## 2016-07-05 NOTE — Progress Notes (Signed)
    Daily Group Progress Note  Program: IOP  Time: 9:00-12:00 Activity Level: active Behavioral Response: engaged Type: Group Summary: Patient presented with a slightly resistant theme today. Patient does not respond to any prompting by the therapist. He is very resistant, and struggles to relate to group topics. Therapist also shared the importance of self-care, and incorporating that into our daily lives.  Shaune PollackBrown, Shalom Mcguiness B, LPC

## 2016-07-08 ENCOUNTER — Other Ambulatory Visit (HOSPITAL_COMMUNITY): Payer: 59 | Admitting: Psychiatry

## 2016-07-08 DIAGNOSIS — F331 Major depressive disorder, recurrent, moderate: Secondary | ICD-10-CM | POA: Diagnosis not present

## 2016-07-08 DIAGNOSIS — F332 Major depressive disorder, recurrent severe without psychotic features: Secondary | ICD-10-CM

## 2016-07-08 DIAGNOSIS — F411 Generalized anxiety disorder: Secondary | ICD-10-CM

## 2016-07-08 NOTE — Progress Notes (Signed)
    Daily Group Progress Note  Program: IOP  Group Time: 9:00-12:00   Participation Level:  minimal   Behavioral Response:  flat   Type of Therapy:   group therapy   Summary of Progress: Client was mostly silent and largely unresponsive when addressed directly in group. One theme he identified with was the idea that when we deny our feelings to spare others discomfort this leads to an emotional disconnect from self. When asked what he hopes to gain from therapy the client responded that this was a good question, but remained otherwise silent.   Shaune PollackBrown, Jennifer B, LPC

## 2016-07-08 NOTE — Progress Notes (Signed)
Daily Group Progress Note                                                     Program: IOP      Time: 9:00-12:00 Activity Level: active Behavioral Response: engaged, responsive Type of Therapy: Psychoeducation/Group Therapy Summary: Patient presented with a very optimistic theme in group today. The Pharmacist shared the different aspects of psych. medications, and answered patient's questions about medication use. Pt participated in a discussion on "Asking for help." Therapist encouraged pt to incorporate self-care (asking for help) for her mental wellness.      Dwayne Rocha, LCAS-A     

## 2016-07-09 ENCOUNTER — Other Ambulatory Visit (HOSPITAL_COMMUNITY): Payer: 59

## 2016-07-09 ENCOUNTER — Other Ambulatory Visit (HOSPITAL_COMMUNITY): Payer: 59 | Admitting: Psychiatry

## 2016-07-09 DIAGNOSIS — F331 Major depressive disorder, recurrent, moderate: Secondary | ICD-10-CM | POA: Diagnosis not present

## 2016-07-09 DIAGNOSIS — F332 Major depressive disorder, recurrent severe without psychotic features: Secondary | ICD-10-CM

## 2016-07-09 NOTE — Progress Notes (Signed)
    Daily Group Progress Note  Program: IOP Group Time: 9:00-12:00   Participation Level:  minimal   Behavioral Response:  responsive   Type of Therapy:   group therapy   Summary of Progress: Client appeared very agitated and anxious.  The client had gone to court about custody of his children, but was unwilling to share the outcome with the group.  The client stated he was "fine" but failed to delve deeper when challenged by the counselor that his physical appearance and actions did not portray "fine."  The counselor discussed the "safe place" the client goes to in her mind when she needs to relax.  The client drew this place and described it to the group.  Shaune PollackBrown, Ammarie Matsuura B, LPC

## 2016-07-10 ENCOUNTER — Other Ambulatory Visit (HOSPITAL_COMMUNITY): Payer: 59 | Admitting: Psychiatry

## 2016-07-10 DIAGNOSIS — F332 Major depressive disorder, recurrent severe without psychotic features: Secondary | ICD-10-CM

## 2016-07-10 DIAGNOSIS — F331 Major depressive disorder, recurrent, moderate: Secondary | ICD-10-CM | POA: Diagnosis not present

## 2016-07-10 NOTE — Progress Notes (Signed)
    Daily Group Progress Note  Program: IOP  Group Time: 9:00-12:00   Participation Level:  minimal   Behavioral Response: responsive   Type of Therapy:  group therapy   Summary of Progress: Client appeared very agitated and anxious during the first part of group, but became open and jovial during the second part.  Client was hostile towards one clinician as evidenced by stern face, squinted eyes, staring at clinician, and angry tone when responding to clinician's questions.  Client discussed enjoying being alone, but did not elaborate on what he likes to do when alone.  Client states he has been using meditation over the past week and will continue to do so.  Counselor encouraged client to use visualization of his safe place, drawn the day before, and meditation in moments when relaxation is needed.  Counselor showed group a Ted-Talk about positive psychology and ways to retrain our brains to think more positively.  Counselor challenged the client to try one suggestion for two minutes each day for three weeks.  Client stated he was will to try.      Shaune PollackBrown, Shyloh Derosa B, LPC

## 2016-07-11 ENCOUNTER — Other Ambulatory Visit (HOSPITAL_COMMUNITY): Payer: 59 | Admitting: Psychiatry

## 2016-07-11 DIAGNOSIS — F332 Major depressive disorder, recurrent severe without psychotic features: Secondary | ICD-10-CM

## 2016-07-11 DIAGNOSIS — F331 Major depressive disorder, recurrent, moderate: Secondary | ICD-10-CM | POA: Diagnosis not present

## 2016-07-11 NOTE — Progress Notes (Signed)
  Patient ID: Dwayne Rocha, male   DOB: 05/12/1976, 40 y.o.   MRN: [id number] IOP discharge Date of admission:  07/01/2016 Date of discharge: 07/12/2016     Mr Dwayne Rocha attended and participated.  Says the groups were helpful.  Depression not so bad but the situational anxiety remains as he is uncertain where his children are living with their mother.    Mental status at discharge reveals no suicidal thoughts  Plan is to follow up Dr Evelene CroonKaur and eventually a therapist in her office  Diagnoses remain generalized anxiety disorder and major depression, recurrent moderate

## 2016-07-12 ENCOUNTER — Other Ambulatory Visit (HOSPITAL_COMMUNITY): Payer: 59 | Attending: Psychiatry | Admitting: Psychiatry

## 2016-07-12 DIAGNOSIS — F332 Major depressive disorder, recurrent severe without psychotic features: Secondary | ICD-10-CM | POA: Insufficient documentation

## 2016-07-12 NOTE — Progress Notes (Signed)
    Daily Group Progress Note  Program: IOP  Group Time: 9:00-12:00  Participation Level: Active  Behavioral Response: Appropriate  Type of Therapy:  Group Therapy  Summary of Progress: Pt. Presented as mildly anxious, more engaged in the group process than previous session. Pt. Apologized to the group for appearing distracted and disengaged during previous session. Pt. Briefly discussed that his legal custody issues were a source of stress for him and were the cause of his distraction. Pt. Participated in discussion about mindfulness and developing awareness for how we are affected by sensory stimulation. Pt. Participated in discussion about the use of aromatherapy as a complement to treatment for depression. Pt. Watched and discussed video about use of vulnerability for coping.      Shaune PollackBrown, Brithany Whitworth B, LPC

## 2016-07-12 NOTE — Patient Instructions (Signed)
Patient completed MH-IOP today.  Will follow up with Dr. Evelene CroonKaur on 07-26-16 @ 2:45 pm.  Encouraged support groups.  [facility] 5-6 pm with Beth [phone]).  If interested call Beth to arrange.
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
## 2016-07-16 ENCOUNTER — Other Ambulatory Visit (HOSPITAL_COMMUNITY): Payer: 59

## 2016-07-17 ENCOUNTER — Other Ambulatory Visit (HOSPITAL_COMMUNITY): Payer: 59

## 2016-07-17 NOTE — Progress Notes (Signed)
    Daily Group Progress Note  Program: IOP  Group Time: 9:00-12:00  Participation Level: Active  Behavioral Response: Appropriate  Type of Therapy:  Group Therapy  Summary of Progress: Pt. Prepared for discharge. Pt. Presented as talkative and engaged in the group process. Pt. Received feedback from the group related to willingness to confront his anxiety and continue to seek treatment.     Shaune PollackBrown, Kimberlye Dilger B, LPC

## 2016-07-18 ENCOUNTER — Other Ambulatory Visit (HOSPITAL_COMMUNITY): Payer: 59

## 2016-07-19 ENCOUNTER — Other Ambulatory Visit (HOSPITAL_COMMUNITY): Payer: 59

## 2016-07-22 ENCOUNTER — Other Ambulatory Visit (HOSPITAL_COMMUNITY): Payer: 59

## 2016-12-08 DIAGNOSIS — S12490A Other displaced fracture of fifth cervical vertebra, initial encounter for closed fracture: Secondary | ICD-10-CM | POA: Insufficient documentation

## 2016-12-11 DIAGNOSIS — S22019A Unspecified fracture of first thoracic vertebra, initial encounter for closed fracture: Secondary | ICD-10-CM | POA: Insufficient documentation

## 2017-05-07 ENCOUNTER — Ambulatory Visit: Payer: 59 | Attending: Neurosurgery | Admitting: Occupational Therapy

## 2017-05-07 ENCOUNTER — Ambulatory Visit: Payer: 59 | Admitting: Physical Therapy

## 2017-05-07 DIAGNOSIS — M6281 Muscle weakness (generalized): Secondary | ICD-10-CM | POA: Insufficient documentation

## 2017-05-07 DIAGNOSIS — R208 Other disturbances of skin sensation: Secondary | ICD-10-CM | POA: Diagnosis present

## 2017-05-07 DIAGNOSIS — M79641 Pain in right hand: Secondary | ICD-10-CM

## 2017-05-07 DIAGNOSIS — M25521 Pain in right elbow: Secondary | ICD-10-CM | POA: Diagnosis present

## 2017-05-07 DIAGNOSIS — R278 Other lack of coordination: Secondary | ICD-10-CM | POA: Insufficient documentation
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
## 2017-05-08 ENCOUNTER — Ambulatory Visit: Payer: 59 | Admitting: Occupational Therapy

## 2017-05-08 DIAGNOSIS — M6281 Muscle weakness (generalized): Secondary | ICD-10-CM

## 2017-05-08 DIAGNOSIS — M25521 Pain in right elbow: Secondary | ICD-10-CM

## 2017-05-08 DIAGNOSIS — M79641 Pain in right hand: Secondary | ICD-10-CM | POA: Diagnosis not present

## 2017-05-08 DIAGNOSIS — R208 Other disturbances of skin sensation: Secondary | ICD-10-CM

## 2017-05-08 NOTE — Patient Instructions (Signed)
Flexor Tendon Gliding (Active Hook Fist)   With fingers and knuckles straight, bend middle and tip joints. Do not bend large knuckles. Repeat _10-15___ times. Do _4-6___ sessions per day.  MP Flexion (Active)   With back of hand on table, bend large knuckles as far as they will go, keeping small joints straight. Repeat _10-15___ times. Do __4-6__ sessions per day. Activity: Reach into a narrow container.*      Finger Flexion / Extension   With palm up, bend fingers of left hand toward palm, making a  fist. Straighten fingers, opening fist. Repeat sequence _10-15___ times per session. Do _4-6__ sessions per day. Hand Variation: Palm down   Copyright  VHI. All rights reserved.    AROM: Wrist Extension   .  With ____ palm down, bend wrist up. Repeat __15__ times per set.  Do __3-4_ sessions per day.    AROM: Wrist Flexion   With_____ palm up, bend wrist up. Repeat __15__ times per set.  Do _4-6___ sessions per day.     AROM: Elbow Flexion / Extension    Gently bend elbow as far as possible. Then straighten arm as far as possible. Repeat _10-15___ times per set. Do _4__ sessions per day.    Elbow / Wrist Supination / Pronation   Bend elbow(s) to 90 and hold close to body. Turn palm(s) up. Then turn palm(s) down. Keep wrist straight. Repeat sequence _10-15___ times per session. Do _4__ sessions per day.    Perform scar massage to palmar incision 2x day for 5 mins, use lotion to rub across incision, along length of incision and make circles at incision.

## 2017-05-08 NOTE — Addendum Note (Signed)
Addended by: Keene BreathINE, Kishia Shackett B on: 05/08/2017 01:08 PM   Modules accepted: Orders

## 2017-05-09 NOTE — Therapy (Signed)
[facility] [address] [place], [place], [address] Phone: [phone]   Fax:  [phone]  Occupational Therapy Treatment  Patient Details  Name: Dwayne Rocha MRN: [id number] Date of Birth: 03/27/1976 Referring Provider: Dr. Coletta MemosKyle Cabbell  Encounter Date: 05/08/2017      OT End of Session - 05/09/17 1241    Visit Number 2   Number of Visits 16   Date for OT Re-Evaluation 06/20/17   Authorization Type UHC 60 visit limit   Authorization - Visit Number 2   Authorization - Number of Visits 60   OT Start Time 0848   OT Stop Time [phone]   OT Time Calculation (min) 50 min   Activity Tolerance Patient tolerated treatment well   Behavior During Therapy [facility] for tasks assessed/performed      Past Medical History:  Diagnosis Date  . Anxiety   . Depression   . Diabetes mellitus   . Hypertension     Past Surgical History:  Procedure Laterality Date  . HIP SURGERY     "pins in hips"-"growth plate slipped"  . WRIST SURGERY      There were no vitals filed for this visit.      Subjective Assessment - 05/09/17 1249    Pertinent History C4-5 anterior fusion [place] 4-7 [place] underwent surgery for right ulnar neuropathy, s/p right ulnar n. release and carpal tunnel release in RUE by Dr. Mikal Planeabell on 03/27/17. 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 Yes   Pain Score 9    Pain Location Hand   Pain Orientation Right   Pain Descriptors / Indicators Aching;Burning;Tingling   Pain Type Acute pain   Pain Onset More than a month ago   Aggravating Factors  movement   Pain Relieving Factors ice, rest   Multiple Pain Sites No          Treatment:Fluidotherapy x 8 mins to RUE for desensitization, pain  relief. Ultrasound x 8ms 3 mhz, 0.8 w/cm 2, 20 % to right hand incision and palmar surface. No adverse reactions. HEP issued. Ice x 8 mins at end of session.                     OT Education - 05/09/17 1247    Education provided Yes   Education Details AROM HEP see pt instructions, scar massage   Person(s) Educated Patient   Methods Explanation;Demonstration;Verbal cues;Handout   Comprehension Verbalized understanding;Returned demonstration;Verbal cues required          OT Short Term Goals - 05/07/17 1517      OT SHORT TERM GOAL #1   Title I with inital HEP due 06/06/17   Time 4   Period Weeks   Status New     OT SHORT TERM GOAL #2   Title Pt will demonstrate RUE supination /Pronation [facility] for peformance of ADLs/IADLs.   Time 4   Period Weeks   Status New     OT SHORT TERM GOAL #3   Title Pt will demonstrate wrist extension WFLs for ADLs/IADLs.   Time 4   Period Weeks   Status New     OT SHORT TERM GOAL #4   Title Pt will demonstrate  RUE grip strength of 40 lbs or greater for increased functional use.   Time 4   Period Weeks   Status New     OT SHORT TERM GOAL #5   Title Pt will verbalize understanding of scar massage/ desensitization techniques.   Time 4   Period Weeks   Status New           OT Long Term Goals - 05/07/17 1523      OT LONG TERM GOAL #1   Title Pt will resume use of RUE as domiant hand at least 90% of the time with pain less than or equal to 4/10.   Time 8   Period Weeks   Status New     OT LONG TERM GOAL #2   Title Pt will perform simulated work activities at a modified indpendent level with pain less than or equal to 4/10.   Time 8   Period Weeks   Status New     OT LONG TERM GOAL #3   Title Pt will demonstrate ability to lift 30 lbs from floor to table height x 5 reps   Time 8   Period Weeks   Status New     OT LONG TERM GOAL #4   Title Pt will increase RUE grip strength to 60lbs or greater in prep for return  to work.   Time 8   Period Weeks   Status New               Plan - 05/09/17 1242    Clinical Impression Statement Pt is progressing towards goals. He is limited by significant pain   Rehab Potential Good   Current Impairments/barriers affecting progress: pain   OT Frequency 2x / week   OT Duration 8 weeks   OT Treatment/Interventions Self-care/ADL training;Moist Heat;Fluidtherapy;DME and/or AE instruction;Splinting;Patient/family education;Balance training;Therapeutic exercises;Therapeutic activities;Therapeutic exercise;Passive range of motion;Neuromuscular education;Electrical Stimulation;Parrafin;Energy conservation;Manual Therapy   Plan ultrasound, pt does not like fluido, A/ROM   OT Home Exercise Plan AROM HEP issued   Consulted and Agree with Plan of Care Patient      Patient will benefit from skilled therapeutic intervention in order to improve the following deficits and impairments:  Decreased coordination, Decreased range of motion, Pain, Decreased strength, Decreased mobility, Decreased cognition, Decreased balance, Decreased knowledge of use of DME, Impaired UE functional use, Impaired tone, Decreased activity tolerance, Decreased knowledge of precautions, Decreased endurance, Decreased safety awareness, Increased edema  Visit Diagnosis: Pain in right hand  Pain in right elbow  Muscle weakness (generalized)  Other disturbances of skin sensation    Problem List Patient Active Problem List   Diagnosis Date Noted  . Severe episode of recurrent major depressive disorder, without psychotic features (HCC)   . MDD (major depressive disorder) 06/10/2016  . DM (diabetes mellitus), type 2, uncontrolled (HCC) 10/02/2015  . Facial cellulitis 09/30/2015  . Anxiety 09/30/2015  . HTN (hypertension) 09/30/2015    Eldora Napp 05/09/2017, 12:51 PM  [facility] [address] [place], [place], [address] Phone:  [phone]   Fax:  [phone]  Name: Dwayne Rocha MRN: [id number] Date of Birth: 12-26-1975

## 2017-05-13 ENCOUNTER — Ambulatory Visit: Payer: 59 | Admitting: Occupational Therapy

## 2017-05-16 ENCOUNTER — Ambulatory Visit: Payer: 59 | Attending: Neurosurgery | Admitting: Occupational Therapy

## 2017-05-16 DIAGNOSIS — M6281 Muscle weakness (generalized): Secondary | ICD-10-CM | POA: Diagnosis present

## 2017-05-16 DIAGNOSIS — M25521 Pain in right elbow: Secondary | ICD-10-CM

## 2017-05-16 DIAGNOSIS — M79641 Pain in right hand: Secondary | ICD-10-CM | POA: Insufficient documentation

## 2017-05-16 DIAGNOSIS — R278 Other lack of coordination: Secondary | ICD-10-CM | POA: Insufficient documentation

## 2017-05-16 DIAGNOSIS — R208 Other disturbances of skin sensation: Secondary | ICD-10-CM | POA: Diagnosis present

## 2017-05-16 NOTE — Patient Instructions (Addendum)
1. Grip Strengthening (Resistive Putty)   Squeeze putty using thumb and all fingers. Repeat _20___ times. Do __2__ sessions per day.   2. Roll putty into tube on table and pinch between each finger and thumb x 10 reps each. (can do ring and small finger together)     Copyright  VHI. All rights reserved.   PROM: Wrist Flexion / Extension   Grasp  hand and slowly bend wrist until stretch is felt. Relax. Then stretch as far as possible in opposite direction. Be sure to keep elbow bent.  Hold __10__ sec. each way Repeat _5___ times per set.    Do _4-6___ sessions per day.  Pronation (Passive)   Keep elbow bent at right angle and held firmly to side. Use other hand to turn forearm until palm faces downward. Hold _10___ seconds. Repeat __5__ times. Do _3__ sessions per day.  Supination (Passive)   Keep elbow bent at right angle and held firmly at side. Use other hand to turn forearm until palm faces upward. Hold __10__ seconds. Repeat __5__ times. Do _3__ sessions per day.  Copyright  VHI. All rights reserved.    AROM: Wrist Extension   . Hold 1 lbs weight or water bottle, bend and straighten wrist 10-15 x, 1-2 x day .     Copyright  VHI. All rights reserved.  AROM: Elbow Flexion / Extension   With left hand palm up, gently bend elbow as far as possible. Hold a 1 lbs weight or water bottle.Then straighten arm as far as possible. Repeat _15___ times per set.  Do _1-2___ sessions per day.   Copyright  VHI. All rights reserved.
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
## 2017-05-19 ENCOUNTER — Ambulatory Visit: Payer: 59 | Admitting: Occupational Therapy

## 2017-05-19 DIAGNOSIS — M6281 Muscle weakness (generalized): Secondary | ICD-10-CM

## 2017-05-19 DIAGNOSIS — R208 Other disturbances of skin sensation: Secondary | ICD-10-CM

## 2017-05-19 DIAGNOSIS — M79641 Pain in right hand: Secondary | ICD-10-CM

## 2017-05-19 DIAGNOSIS — M25521 Pain in right elbow: Secondary | ICD-10-CM

## 2017-05-19 DIAGNOSIS — R278 Other lack of coordination: Secondary | ICD-10-CM
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
## 2017-05-20 ENCOUNTER — Ambulatory Visit: Payer: 59 | Admitting: Occupational Therapy

## 2017-05-20 DIAGNOSIS — M6281 Muscle weakness (generalized): Secondary | ICD-10-CM

## 2017-05-20 DIAGNOSIS — M79641 Pain in right hand: Secondary | ICD-10-CM | POA: Diagnosis not present

## 2017-05-20 DIAGNOSIS — M25521 Pain in right elbow: Secondary | ICD-10-CM

## 2017-05-20 DIAGNOSIS — R208 Other disturbances of skin sensation: Secondary | ICD-10-CM
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
## 2017-05-27 ENCOUNTER — Ambulatory Visit: Payer: 59 | Admitting: Occupational Therapy

## 2017-05-27 DIAGNOSIS — R208 Other disturbances of skin sensation: Secondary | ICD-10-CM

## 2017-05-27 DIAGNOSIS — M6281 Muscle weakness (generalized): Secondary | ICD-10-CM

## 2017-05-27 DIAGNOSIS — M79641 Pain in right hand: Secondary | ICD-10-CM

## 2017-05-27 DIAGNOSIS — R278 Other lack of coordination: Secondary | ICD-10-CM

## 2017-05-27 DIAGNOSIS — M25521 Pain in right elbow: Secondary | ICD-10-CM
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
## 2017-05-29 ENCOUNTER — Ambulatory Visit: Payer: 59 | Admitting: Occupational Therapy

## 2017-05-29 DIAGNOSIS — R278 Other lack of coordination: Secondary | ICD-10-CM

## 2017-05-29 DIAGNOSIS — M79641 Pain in right hand: Secondary | ICD-10-CM

## 2017-05-29 DIAGNOSIS — M25521 Pain in right elbow: Secondary | ICD-10-CM

## 2017-05-29 DIAGNOSIS — M6281 Muscle weakness (generalized): Secondary | ICD-10-CM

## 2017-05-29 DIAGNOSIS — R208 Other disturbances of skin sensation: Secondary | ICD-10-CM

## 2017-05-29 NOTE — Patient Instructions (Addendum)
Dr. Mikal Planeabell,   [name] is receiving occupational therapy to address his ulnar n. decompression and carpal tunnel release. He continues to experience pain in his wrist and palm of of RUE. He also reports significant right shoulder pain. A/ROM right elbow flexion/ extension: 135/ -5, supination/ pronation : 80/80,  wrist flexion / extension 65/ 50,  RUE grip strength:35 lbs, LUE: 83 lbs We have initiated light strengthening. I have concerns regarding his ability to return to work in the near future due to his continued pain and decreased overall strength. His job requires him to lift up to 50 lbs. Please advise regarding any new recommendations/ precautions related to his shoulder. Does he have any lifting restrictions related to his ulnar n. decompression/ CTR or cervical fusion? As he gets closer to return to work he may benefit from an [employer]. Please feel free to contact me with any questions.  Sincerely, Keene BreathKathryn Rine, OTR/L
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
## 2017-06-03 ENCOUNTER — Ambulatory Visit: Payer: 59 | Admitting: Occupational Therapy

## 2017-06-03 DIAGNOSIS — R278 Other lack of coordination: Secondary | ICD-10-CM

## 2017-06-03 DIAGNOSIS — M79641 Pain in right hand: Secondary | ICD-10-CM

## 2017-06-03 DIAGNOSIS — M6281 Muscle weakness (generalized): Secondary | ICD-10-CM

## 2017-06-03 DIAGNOSIS — M25521 Pain in right elbow: Secondary | ICD-10-CM

## 2017-06-03 DIAGNOSIS — R208 Other disturbances of skin sensation: Secondary | ICD-10-CM

## 2017-06-03 NOTE — Patient Instructions (Signed)
MP Flexion (Active)    With back of hand on table, bend large knuckles as far as they will go, keeping small joints straight. Repeat __10__ times. Do __3__ sessions per day.  Abduction / Adduction (Active)    With hand flat on table, spread all fingers apart, then bring them together as close as possible. Repeat _10___ times. Do __3__ sessions per day.  Palmar Adduction/Abduction (Active)    Move thumb down, away from palm. Move back to rest along palm. Repeat __10__ times. Do _3___ sessions per day. Can progress to light resistance using rubber band   Lateral Pinch Strengthening (Resistive Putty)    Squeeze between thumb and side of index finger in turn. Repeat __10__ times. Do __2__ sessions per day.  Continue doing all other exercises (including tendon gliding ex's, wrist and elbow ex's, and putty ex's)
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
## 2017-06-04 ENCOUNTER — Ambulatory Visit: Payer: 59 | Admitting: Occupational Therapy

## 2017-06-04 DIAGNOSIS — M79641 Pain in right hand: Secondary | ICD-10-CM

## 2017-06-04 DIAGNOSIS — M6281 Muscle weakness (generalized): Secondary | ICD-10-CM

## 2017-06-04 DIAGNOSIS — M25521 Pain in right elbow: Secondary | ICD-10-CM
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
## 2017-06-10 ENCOUNTER — Ambulatory Visit: Payer: 59 | Admitting: Occupational Therapy

## 2017-06-10 DIAGNOSIS — R208 Other disturbances of skin sensation: Secondary | ICD-10-CM

## 2017-06-10 DIAGNOSIS — M6281 Muscle weakness (generalized): Secondary | ICD-10-CM

## 2017-06-10 DIAGNOSIS — M25521 Pain in right elbow: Secondary | ICD-10-CM

## 2017-06-10 DIAGNOSIS — M79641 Pain in right hand: Secondary | ICD-10-CM | POA: Diagnosis not present

## 2017-06-10 DIAGNOSIS — R278 Other lack of coordination: Secondary | ICD-10-CM
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
## 2017-06-10 NOTE — Patient Instructions (Signed)
        Elbow Flexion: Resisted   With tubing held in __right____ hand(s) and other end secured under foot, curl arm up as far as possible. Repeat _10___ times per set. Do _1-2___ sessions per day, every other day.    Elbow Extension: Resisted   Sit in chair with resistive band secured at armrest (or hold with other hand) and __right_____ elbow bent. Straighten elbow. Repeat _10___ times per set.  Do _1-2___ sessions per day, every other day.   Copyright  VHI. All rights reserved.

## 2017-06-13 ENCOUNTER — Ambulatory Visit: Payer: 59 | Attending: Neurosurgery | Admitting: Occupational Therapy

## 2017-06-13 DIAGNOSIS — M25521 Pain in right elbow: Secondary | ICD-10-CM | POA: Diagnosis present

## 2017-06-13 DIAGNOSIS — M6281 Muscle weakness (generalized): Secondary | ICD-10-CM

## 2017-06-13 DIAGNOSIS — R278 Other lack of coordination: Secondary | ICD-10-CM | POA: Diagnosis present

## 2017-06-13 DIAGNOSIS — M79641 Pain in right hand: Secondary | ICD-10-CM | POA: Insufficient documentation

## 2017-06-13 DIAGNOSIS — R208 Other disturbances of skin sensation: Secondary | ICD-10-CM | POA: Diagnosis present
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
## 2017-06-17 ENCOUNTER — Ambulatory Visit: Payer: 59 | Admitting: Occupational Therapy

## 2017-06-17 DIAGNOSIS — R278 Other lack of coordination: Secondary | ICD-10-CM

## 2017-06-17 DIAGNOSIS — M79641 Pain in right hand: Secondary | ICD-10-CM | POA: Diagnosis not present

## 2017-06-17 DIAGNOSIS — M6281 Muscle weakness (generalized): Secondary | ICD-10-CM

## 2017-06-17 DIAGNOSIS — R208 Other disturbances of skin sensation: Secondary | ICD-10-CM

## 2017-06-17 DIAGNOSIS — M25521 Pain in right elbow: Secondary | ICD-10-CM

## 2017-06-17 NOTE — Patient Instructions (Addendum)
06/17/17  Dr. Mikal Planeabell,    [name] is receiving occupational therapy to address his ulnar n. decompression and carpal tunnel release. He continues to experience pain in his wrist and palm of of RUE.   A/ROM: right elbow flexion/ extension: 135/ 0, supination/ pronation: 85/88 wrist flexion / extension 65/ 55,  RUE grip strength:48 lbs, LUE: 95 lbs We have initiated light strengthening. I have concerns regarding his ability to return to work in the near future due to his continued pain and decreased overall strength. His job requires him to lift up to 50 lbs.   Does he have any lifting restrictions at this time related to his ulnar n. decompression, CTR or cervical fusion?  Do you think he may benefit from an FCE closer to his scheduled return to work?  Please fax back your response to [phone] or feel free to contact me at [phone].  Thanks for your assistance.   Sincerely,  Keene BreathKathryn Rine, OTR/L
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
## 2017-06-19 ENCOUNTER — Ambulatory Visit: Payer: 59 | Admitting: Occupational Therapy

## 2017-06-19 DIAGNOSIS — R278 Other lack of coordination: Secondary | ICD-10-CM

## 2017-06-19 DIAGNOSIS — M6281 Muscle weakness (generalized): Secondary | ICD-10-CM

## 2017-06-19 DIAGNOSIS — M79641 Pain in right hand: Secondary | ICD-10-CM | POA: Diagnosis not present

## 2017-06-19 DIAGNOSIS — M25521 Pain in right elbow: Secondary | ICD-10-CM

## 2017-06-19 DIAGNOSIS — R208 Other disturbances of skin sensation: Secondary | ICD-10-CM
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
## 2017-06-24 ENCOUNTER — Ambulatory Visit: Payer: 59 | Admitting: Occupational Therapy

## 2017-06-27 ENCOUNTER — Ambulatory Visit: Payer: 59 | Admitting: Occupational Therapy

## 2017-06-27 DIAGNOSIS — R278 Other lack of coordination: Secondary | ICD-10-CM

## 2017-06-27 DIAGNOSIS — R208 Other disturbances of skin sensation: Secondary | ICD-10-CM

## 2017-06-27 DIAGNOSIS — M79641 Pain in right hand: Secondary | ICD-10-CM | POA: Diagnosis not present

## 2017-06-27 DIAGNOSIS — M25521 Pain in right elbow: Secondary | ICD-10-CM

## 2017-06-27 DIAGNOSIS — M6281 Muscle weakness (generalized): Secondary | ICD-10-CM

## 2017-06-27 NOTE — Patient Instructions (Signed)
  Strengthening: Resisted Flexion   Hold tubing with __right___ arm(s) at side. Pull forward and up. Move shoulder through pain-free range of motion. Repeat __10__ times per set.  Do _1-2_ sessions per day , every other day   Strengthening: Resisted Extension   Hold tubing in __right___ hand(s), arm forward. Pull arm back, elbow straight. Repeat _10___ times per set. Do _1-2___ sessions per day, every other day.   Resisted Horizontal Abduction: Bilateral   Sit or stand, tubing in both hands, arms out in front. Keeping arms straight, pinch shoulder blades together and stretch arms out. Repeat _10___ times per set. Do _1-2___ sessions per day, every other day.   Elbow Flexion: Resisted   With tubing held in __right____ hand(s) and other end secured under foot, curl arm up as far as possible. Repeat _10___ times per set. Do _1-2___ sessions per day, every other day.    Elbow Extension: Resisted   Sit in chair with resistive band secured at armrest (or hold with other hand) and __right_____ elbow bent. Straighten elbow. Repeat _10___ times per set.  Do _1-2___ sessions per day, every other day.   Copyright  VHI. All rights reserved.    
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
## 2017-07-01 ENCOUNTER — Ambulatory Visit: Payer: 59 | Admitting: Occupational Therapy

## 2017-07-01 DIAGNOSIS — R278 Other lack of coordination: Secondary | ICD-10-CM

## 2017-07-01 DIAGNOSIS — M79641 Pain in right hand: Secondary | ICD-10-CM | POA: Diagnosis not present

## 2017-07-01 DIAGNOSIS — R208 Other disturbances of skin sensation: Secondary | ICD-10-CM

## 2017-07-01 DIAGNOSIS — M6281 Muscle weakness (generalized): Secondary | ICD-10-CM

## 2017-07-01 DIAGNOSIS — M25521 Pain in right elbow: Secondary | ICD-10-CM
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
## 2017-07-04 ENCOUNTER — Ambulatory Visit: Payer: 59 | Admitting: Occupational Therapy

## 2017-07-04 DIAGNOSIS — M79641 Pain in right hand: Secondary | ICD-10-CM | POA: Diagnosis not present

## 2017-07-04 DIAGNOSIS — M6281 Muscle weakness (generalized): Secondary | ICD-10-CM

## 2017-07-04 DIAGNOSIS — M25521 Pain in right elbow: Secondary | ICD-10-CM

## 2017-07-04 DIAGNOSIS — R208 Other disturbances of skin sensation: Secondary | ICD-10-CM
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
## 2017-07-07 ENCOUNTER — Ambulatory Visit: Payer: 59 | Admitting: Occupational Therapy

## 2017-07-07 DIAGNOSIS — M25521 Pain in right elbow: Secondary | ICD-10-CM

## 2017-07-07 DIAGNOSIS — M6281 Muscle weakness (generalized): Secondary | ICD-10-CM

## 2017-07-07 DIAGNOSIS — M79641 Pain in right hand: Secondary | ICD-10-CM

## 2017-07-07 DIAGNOSIS — R208 Other disturbances of skin sensation: Secondary | ICD-10-CM
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
## 2017-07-09 ENCOUNTER — Ambulatory Visit: Payer: 59 | Admitting: Occupational Therapy

## 2017-07-09 DIAGNOSIS — M79641 Pain in right hand: Secondary | ICD-10-CM | POA: Diagnosis not present

## 2017-07-09 DIAGNOSIS — M6281 Muscle weakness (generalized): Secondary | ICD-10-CM

## 2017-07-09 DIAGNOSIS — R278 Other lack of coordination: Secondary | ICD-10-CM

## 2017-07-09 DIAGNOSIS — R208 Other disturbances of skin sensation: Secondary | ICD-10-CM

## 2017-07-09 DIAGNOSIS — M25521 Pain in right elbow: Secondary | ICD-10-CM

## 2017-07-09 NOTE — Patient Instructions (Addendum)
   Dr. Franky Rocha,  [name] has been receiving occupational therapy. We have been addressing RUE strengthening in prep for return to work. He is able to lift 50 lbs with both arms, to and from a table at chest height 10 reps. He is able to lift a 26 lbs bucket with RUE unilaterally to and from a tabletop 10 reps. When he returns to work he will have to lift items  for 8-12 hours at a time. I am concerned about his ability to do so. He may still benefit from an FCE to further assess his abilities. Please feel free to contact me with questions.  Sincerely, Keene Breath, OTR/L
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
## 2017-07-16 ENCOUNTER — Ambulatory Visit: Payer: 59 | Attending: Neurosurgery | Admitting: Occupational Therapy

## 2017-07-16 DIAGNOSIS — M79641 Pain in right hand: Secondary | ICD-10-CM

## 2017-07-16 DIAGNOSIS — M25521 Pain in right elbow: Secondary | ICD-10-CM | POA: Diagnosis present

## 2017-07-16 DIAGNOSIS — R278 Other lack of coordination: Secondary | ICD-10-CM | POA: Diagnosis present

## 2017-07-16 DIAGNOSIS — M6281 Muscle weakness (generalized): Secondary | ICD-10-CM

## 2017-07-16 DIAGNOSIS — R208 Other disturbances of skin sensation: Secondary | ICD-10-CM
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
## 2017-07-18 ENCOUNTER — Ambulatory Visit: Payer: 59 | Admitting: Occupational Therapy

## 2017-07-18 DIAGNOSIS — M79641 Pain in right hand: Secondary | ICD-10-CM | POA: Diagnosis not present

## 2017-07-18 DIAGNOSIS — R208 Other disturbances of skin sensation: Secondary | ICD-10-CM

## 2017-07-18 DIAGNOSIS — M6281 Muscle weakness (generalized): Secondary | ICD-10-CM

## 2017-07-18 DIAGNOSIS — M25521 Pain in right elbow: Secondary | ICD-10-CM
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
## 2017-10-01 ENCOUNTER — Ambulatory Visit: Payer: 59 | Attending: Neurosurgery | Admitting: Physical Therapy

## 2017-10-01 ENCOUNTER — Encounter: Payer: Self-pay | Admitting: Physical Therapy

## 2017-10-01 DIAGNOSIS — M6281 Muscle weakness (generalized): Secondary | ICD-10-CM | POA: Insufficient documentation

## 2017-10-01 DIAGNOSIS — R293 Abnormal posture: Secondary | ICD-10-CM | POA: Diagnosis present

## 2017-10-01 DIAGNOSIS — R208 Other disturbances of skin sensation: Secondary | ICD-10-CM | POA: Diagnosis present

## 2017-10-01 DIAGNOSIS — M542 Cervicalgia: Secondary | ICD-10-CM | POA: Diagnosis present
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
## 2017-10-07 ENCOUNTER — Ambulatory Visit: Payer: 59 | Admitting: Physical Therapy

## 2017-10-09 ENCOUNTER — Encounter: Payer: Self-pay | Admitting: Rehabilitation

## 2017-10-09 ENCOUNTER — Ambulatory Visit: Payer: 59 | Admitting: Rehabilitation

## 2017-10-09 DIAGNOSIS — M6281 Muscle weakness (generalized): Secondary | ICD-10-CM

## 2017-10-09 DIAGNOSIS — R293 Abnormal posture: Secondary | ICD-10-CM

## 2017-10-09 DIAGNOSIS — M542 Cervicalgia: Secondary | ICD-10-CM

## 2017-10-09 NOTE — Therapy (Signed)
[facility] [address] [place], [place], [address] Phone: [phone]   Fax:  [phone]  Physical Therapy Treatment  Patient Details  Name: Dwayne Rocha MRN: [id number] Date of Birth: 08/04/1976 Referring Provider: Dr. Coletta Memoskyle Cabbell    Encounter Date: 10/09/2017  PT End of Session - 10/09/17 0911    Visit Number  2    Number of Visits  12    Date for PT Re-Evaluation  11/12/17    PT Start Time  0847    PT Stop Time  0937    PT Time Calculation (min)  50 min    Activity Tolerance  Patient tolerated treatment well    Behavior During Therapy  [facility] for tasks assessed/performed       Past Medical History:  Diagnosis Date  . Anxiety   . Depression   . Diabetes mellitus   . Hypertension     Past Surgical History:  Procedure Laterality Date  . HIP SURGERY     "pins in hips"-"growth plate slipped"  . WRIST SURGERY      There were no vitals filed for this visit.  Subjective Assessment - 10/09/17 0848    Subjective  Feeling about the same      Pain Score  5     Pain Location  Neck    Aggravating Factors   trying to hold head up, stay upright, turning head    Pain Relieving Factors  resting head                       [facility] Adult PT Treatment/Exercise - 10/09/17 0001      Exercises   Exercises  Neck      Neck Exercises: Seated   Neck Retraction  10 reps;5 secs    Neck Retraction Limitations  cueing needed for performance    Cervical Rotation  5 reps 6sec hold    Postural Training  seated    Other Seated Exercise  scap retraction 5"x15      Neck Exercises: Supine   Neck Retraction  10 reps    Neck Retraction Limitations  x6"    Other Supine Exercise  horizontal abduction blue x 10 chin tuck , D2 bil x 10each blue      Modalities   Modalities  Cryotherapy      Cryotherapy   Number Minutes Cryotherapy  10 Minutes    Cryotherapy Location  Cervical    Type of Cryotherapy  Ice pack      Manual Therapy   Manual Therapy  Soft tissue mobilization    Soft tissue mobilization  supine to L UT gentle pressure trial for tolerance      Neck Exercises: Stretches   Upper Trapezius Stretch  2 reps;30 seconds                  PT Long Term Goals - 10/01/17 1515      PT LONG TERM GOAL #1   Title  Pt will be I with HEP for cervical stab and strength     Time  6    Period  Weeks    Status  New    Target Date  11/12/17      PT LONG TERM GOAL #2   Title  Pt will increase grip strength within 10-15 lbs of L UE     Baseline  Rt.  40 lbs, Lt. 80     Time  6  Period  Weeks    Status  New    Target Date  11/12/17      PT LONG TERM GOAL #3   Title  Pt will be able to rotate cervical spine in each direction with min pain < 3/10.     Time  6    Period  Weeks    Status  New    Target Date  11/12/17      PT LONG TERM GOAL #4   Title  FOTO score will improve 45% limited or less to demo improved functional mobiity.     Time  6    Period  Weeks    Status  New    Target Date  11/12/17      PT LONG TERM GOAL #5   Title  Pt will be able to sit without head support, corrected posture and pain minimal.     Time  6    Period  Weeks    Status  New    Target Date  11/12/17            Plan - 10/09/17 0912    Clinical Impression Statement  pt performs HEP well and all TE today with reports of increased Rocha numbness, tingling, and pain in the Rocha hand and forearm.  unsure how the manual work felt, better or worse.  will assess at home    PT Frequency  2x / week    PT Duration  6 weeks    PT Treatment/Interventions  ADLs/Self Care Home Management;Cryotherapy;Electrical Stimulation;Ultrasound;Moist Heat;Neuromuscular re-education;Passive range of motion;Dry needling;Patient/family education;Therapeutic activities;Therapeutic exercise    PT Next Visit Plan  continue postural strength, cervical strength and ROM, prefers cold to heat, manual effects?    PT Home Exercise Plan   stab, posture, gentle AROM        Patient will benefit from skilled therapeutic intervention in order to improve the following deficits and impairments:  Increased fascial restricitons, Impaired sensation, Pain, Improper body mechanics, Postural dysfunction, Decreased mobility, Decreased range of motion, Decreased strength, Impaired UE functional use, Obesity, Impaired flexibility, Increased edema  Visit Diagnosis: Muscle weakness (generalized)  Cervicalgia  Abnormal posture     Problem List Patient Active Problem List   Diagnosis Date Noted  . Severe episode of recurrent major depressive disorder, without psychotic features (HCC)   . MDD (major depressive disorder) 06/10/2016  . DM (diabetes mellitus), type 2, uncontrolled (HCC) 10/02/2015  . Facial cellulitis 09/30/2015  . Anxiety 09/30/2015  . HTN (hypertension) 09/30/2015    Dwayne Rocha, Dwayne Rocha, DPT, CMP 10/09/2017, 9:29 AM  [facility] [address] [place], [place], [address] Phone: [phone]   Fax:  [phone]  Name: Dwayne Rocha MRN: [id number] Date of Birth: 12/11/1975

## 2017-10-15 ENCOUNTER — Encounter: Payer: Self-pay | Admitting: Physical Therapy

## 2017-10-15 ENCOUNTER — Ambulatory Visit: Payer: 59 | Attending: Neurosurgery | Admitting: Physical Therapy

## 2017-10-15 DIAGNOSIS — M6281 Muscle weakness (generalized): Secondary | ICD-10-CM | POA: Diagnosis present

## 2017-10-15 DIAGNOSIS — R208 Other disturbances of skin sensation: Secondary | ICD-10-CM | POA: Diagnosis present

## 2017-10-15 DIAGNOSIS — M542 Cervicalgia: Secondary | ICD-10-CM | POA: Insufficient documentation

## 2017-10-15 DIAGNOSIS — R293 Abnormal posture: Secondary | ICD-10-CM | POA: Diagnosis present

## 2017-10-15 NOTE — Therapy (Signed)
[facility] [address] [place], [place], [address] Phone: [phone]   Fax:  [phone]  Physical Therapy Treatment  Patient Details  Name: Dwayne Rocha MRN: [id number] Date of Birth: 04/14/1976 Referring Provider: Dr. Coletta Memoskyle Cabbell    Encounter Date: 10/15/2017  PT End of Session - 10/15/17 1237    Visit Number  3    Number of Visits  12    Date for PT Re-Evaluation  11/12/17    PT Start Time  0926    PT Stop Time  1011    PT Time Calculation (min)  45 min    Activity Tolerance  Patient tolerated treatment well    Behavior During Therapy  [facility] for tasks assessed/performed       Past Medical History:  Diagnosis Date  . Anxiety   . Depression   . Diabetes mellitus   . Hypertension     Past Surgical History:  Procedure Laterality Date  . HIP SURGERY     "pins in hips"-"growth plate slipped"  . WRIST SURGERY      There were no vitals filed for this visit.  Subjective Assessment - 10/15/17 0929    Subjective  Pt reports he is "doing ok" and that pain is "about the same". Pt. notes prescibed HEP has been going "fine".    Currently in Pain?  Yes    Pain Score  5     Pain Location  Neck    Pain Orientation  Left;Lateral;Posterior       [facility] Adult PT Treatment/Exercise - 10/15/17 0936      Neck Exercises: Machines for Strengthening   UBE (Upper Arm Bike)  L2 x 5 minutes; 2.5 minutes forward, 2.5 minutes reverse       Neck Exercises: Theraband   Horizontal ABduction  15 reps;Blue in supine, 2 sets       Neck Exercises: Seated   Cervical Isometrics  Flexion;Extension;Right lateral flexion;Left lateral flexion;10 secs;5 reps pain with extension, only 1 rep      Neck Exercises: Supine   Other Supine Exercise  scapular retraction w/ ER; blue theraband; 1 set; 10 reps pt. noted pain in lower c-spine bilaterally      Modalities   Modalities  Cryotherapy      Cryotherapy   Number Minutes Cryotherapy  5  Minutes    Cryotherapy Location  Cervical    Type of Cryotherapy  Ice pack      Manual Therapy   Manual Therapy  Manual Traction;Soft tissue mobilization;Joint mobilization    Manual therapy comments  PROM cervical rotation progressing into new range, pt. noted no pain; Passive upper trap stretch L side, 2 reps, 30 seconds holds; trigger point release L levator scapulae, x3     Joint Mobilization  1st rib mobilization, grade 3-4, 45-60 second bouts, 3 reps, bilaterally     Manual Traction  manual traction to cervical spine, 1 minute holds, 3 reps, pt. noted no change in symptoms        PT Long Term Goals - 10/01/17 1515      PT LONG TERM GOAL #1   Title  Pt will be I with HEP for cervical stab and strength     Time  6    Period  Weeks    Status  New    Target Date  11/12/17      PT LONG TERM GOAL #2   Title  Pt will increase grip strength within 10-15 lbs of  L UE     Baseline  Rt.  40 lbs, Lt. 80     Time  6    Period  Weeks    Status  New    Target Date  11/12/17      PT LONG TERM GOAL #3   Title  Pt will be able to rotate cervical spine in each direction with min pain < 3/10.     Time  6    Period  Weeks    Status  New    Target Date  11/12/17      PT LONG TERM GOAL #4   Title  FOTO score will improve 45% limited or less to demo improved functional mobiity.     Time  6    Period  Weeks    Status  New    Target Date  11/12/17      PT LONG TERM GOAL #5   Title  Pt will be able to sit without head support, corrected posture and pain minimal.     Time  6    Period  Weeks    Status  New    Target Date  11/12/17       Plan - 10/15/17 1239    Clinical Impression Statement  Pt. presents to clinic with no change in condition since previous session and continues to report 5/10 pain in cervical spine. Today's session consisted of manual therapy for pain relief and therapeutic exercise to stabilize c-spine. Pt. had palpable trigger points over L levator scapulae and noted  pain during certain therapeutic exercises. Pt. received ice post-session and inquired about dry needling which may be attempted in future sessions.     PT Frequency  2x / week    PT Duration  6 weeks    PT Treatment/Interventions  ADLs/Self Care Home Management;Cryotherapy;Electrical Stimulation;Ultrasound;Moist Heat;Neuromuscular re-education;Passive range of motion;Dry needling;Patient/family education;Therapeutic activities;Therapeutic exercise    PT Next Visit Plan  DN, continue postural strength, cervical strength and ROM, prefers cold to heat,     PT Home Exercise Plan  stab, posture, gentle AROM        Patient will benefit from skilled therapeutic intervention in order to improve the following deficits and impairments:  Increased fascial restricitons, Impaired sensation, Pain, Improper body mechanics, Postural dysfunction, Decreased mobility, Decreased range of motion, Decreased strength, Impaired UE functional use, Obesity, Impaired flexibility, Increased edema  Visit Diagnosis: Cervicalgia  Muscle weakness (generalized)  Abnormal posture     Problem List Patient Active Problem List   Diagnosis Date Noted  . Severe episode of recurrent major depressive disorder, without psychotic features (HCC)   . MDD (major depressive disorder) 06/10/2016  . DM (diabetes mellitus), type 2, uncontrolled (HCC) 10/02/2015  . Facial cellulitis 09/30/2015  . Anxiety 09/30/2015  . HTN (hypertension) 09/30/2015    Dwayne Rocha, SPT 10/15/17 1:11 PM   [facility] [address] [place], [place], [address] Phone: [phone]   Fax:  [phone]  Name: Dwayne PihWilliam Welshans MRN: [id number] Date of Birth: 07/17/1976

## 2017-10-17 ENCOUNTER — Encounter: Payer: Self-pay | Admitting: Physical Therapy

## 2017-10-17 ENCOUNTER — Ambulatory Visit: Payer: 59 | Admitting: Physical Therapy

## 2017-10-17 DIAGNOSIS — M542 Cervicalgia: Secondary | ICD-10-CM | POA: Diagnosis not present

## 2017-10-17 DIAGNOSIS — R293 Abnormal posture: Secondary | ICD-10-CM

## 2017-10-17 DIAGNOSIS — M6281 Muscle weakness (generalized): Secondary | ICD-10-CM

## 2017-10-17 DIAGNOSIS — R208 Other disturbances of skin sensation: Secondary | ICD-10-CM

## 2017-10-17 NOTE — Patient Instructions (Signed)
Over Head Pull: Narrow Grip       On back, knees bent, feet flat, band across thighs, elbows straight but relaxed. Pull hands apart (start). Keeping elbows straight, bring arms up and over head, hands toward floor. Keep pull steady on band. Hold momentarily. Return slowly, keeping pull steady, back to start. Repeat __10-15_ times. Band color _____Red or blue _   Side Pull: Double Arm   On back, knees bent, feet flat. Arms perpendicular to body, shoulder level, elbows straight but relaxed. Pull arms out to sides, elbows straight. Resistance band comes across collarbones, hands toward floor. Hold momentarily. Slowly return to starting position. Repeat __10-15_ times. Band color __Red or blue ___   Sash   On back, knees bent, feet flat, left hand on left hip, right hand above left. Pull right arm DIAGONALLY (hip to shoulder) across chest. Bring right arm along head toward floor. Hold momentarily. Slowly return to starting position. Repeat __10-15_ times. Do with left arm. Band color ___Red or blue ___   Shoulder Rotation: Double Arm   On back, knees bent, feet flat, elbows tucked at sides, bent 90, hands palms up. Pull hands apart and down toward floor, keeping elbows near sides. Hold momentarily. Slowly return to starting position. Repeat __10-15_ times. Band color _____Red or blue _

## 2017-10-17 NOTE — Therapy (Signed)
[facility] [address] [place], [place], [address] Phone: [phone]   Fax:  [phone]  Physical Therapy Treatment  Patient Details  Name: Dwayne Rocha MRN: [id number] Date of Birth: 08/13/1976 Referring Provider: Dr. Coletta Memoskyle Cabbell    Encounter Date: 10/17/2017  PT End of Session - 10/17/17 0915    Visit Number  4    Number of Visits  12    Date for PT Re-Evaluation  11/12/17    PT Start Time  0845    PT Stop Time  0925    PT Time Calculation (min)  40 min    Activity Tolerance  Patient tolerated treatment well    Behavior During Therapy  [facility] for tasks assessed/performed       Past Medical History:  Diagnosis Date  . Anxiety   . Depression   . Diabetes mellitus   . Hypertension     Past Surgical History:  Procedure Laterality Date  . HIP SURGERY     "pins in hips"-"growth plate slipped"  . WRIST SURGERY      There were no vitals filed for this visit.  Subjective Assessment - 10/17/17 0850    Subjective  Complains of pain always the same.  4/10.  No change at all. I figure I'll always have pain.     Currently in Pain?  Yes    Pain Score  4     Pain Location  Neck    Pain Orientation  Left    Pain Descriptors / Indicators  Sharp    Pain Type  Chronic pain    Pain Onset  More than a month ago    Pain Frequency  Constant    Aggravating Factors   using my neck    Pain Relieving Factors  resting     Effect of Pain on Daily Activities  limits what he can do        [facility] Treatment/Exercise - 10/17/17 0001      Neck Exercises: Supine   Neck Retraction  10 reps    Shoulder Flexion  Both;15 reps cane with chin tuck     Other Supine Exercise  supine scapular stab red band x 10: overhead, horiz pull, ER/IR      Shoulder Exercises: ROM/Strengthening   UBE (Upper Arm Bike)  4 min with corrected posture, level 1       Manual Therapy   Soft tissue mobilization  upper traps, levator scap, pin and  stretch , used IASTM and manual for down regulating, discomfort noted, pain in [place] as I worked the [employer] neck              PT Education - 10/17/17 0914    Education provided  Yes    Education Details  Scapular stab HEP red bands    Person(s) Educated  Patient    Methods  Explanation;Demonstration    Comprehension  Verbalized understanding;Returned demonstration          PT Long Term Goals - 10/17/17 1026      PT LONG TERM GOAL #1   Title  Pt will be I with HEP for cervical stab and strength     Status  On-going      PT LONG TERM GOAL #2   Title  Pt will increase grip strength within 10-15 lbs of L UE     Status  Unable to assess      PT LONG TERM GOAL #3  Title  Pt will be able to rotate cervical spine in each direction with min pain < 3/10.     Status  On-going      PT LONG TERM GOAL #4   Title  FOTO score will improve 45% limited or less to demo improved functional mobiity.     Status  On-going      PT LONG TERM GOAL #5   Title  Pt will be able to sit without head support, corrected posture and pain minimal.     Status  On-going            Plan - 10/17/17 0916    Clinical Impression Statement  Pt goes back to work next Thursday for a short period of time, at full duty.  He will likely have difficulty doing this.  He did not remind me of this until the end of the session. Addressed soft tissues prior to exercise.  Pain flared after this, although he was able to complete the rest of the session well.  Looking forward to dry needling next session?     PT Treatment/Interventions  ADLs/Self Care Home Management;Cryotherapy;Electrical Stimulation;Ultrasound;Moist Heat;Neuromuscular re-education;Passive range of motion;Dry needling;Patient/family education;Therapeutic activities;Therapeutic exercise    PT Next Visit Plan  DN, continue postural strength, cervical strength and ROM, prefers cold to heat,     PT Home Exercise Plan  stab, posture, gentle AROM , supine  scap bands     Consulted and Agree with Plan of Care  Patient       Patient will benefit from skilled therapeutic intervention in order to improve the following deficits and impairments:  Increased fascial restricitons, Impaired sensation, Pain, Improper body mechanics, Postural dysfunction, Decreased mobility, Decreased range of motion, Decreased strength, Impaired UE functional use, Obesity, Impaired flexibility, Increased edema  Visit Diagnosis: Cervicalgia  Muscle weakness (generalized)  Abnormal posture  Other disturbances of skin sensation     Problem List Patient Active Problem List   Diagnosis Date Noted  . Severe episode of recurrent major depressive disorder, without psychotic features (HCC)   . MDD (major depressive disorder) 06/10/2016  . DM (diabetes mellitus), type 2, uncontrolled (HCC) 10/02/2015  . Facial cellulitis 09/30/2015  . Anxiety 09/30/2015  . HTN (hypertension) 09/30/2015    Cecilie Heidel 10/17/2017, 10:32 AM  [facility] [address] [place], [place], [address] Phone: [phone]   Fax:  [phone]  Name: Dwayne PihWilliam Rocha MRN: [id number] Date of Birth: 09/03/1976   Karie MainlandJennifer Meleana Commerford, PT 10/17/17 10:32 AM Phone: [phone] Fax: [phone]

## 2017-10-20 ENCOUNTER — Ambulatory Visit: Payer: 59 | Admitting: Physical Therapy

## 2017-10-22 ENCOUNTER — Encounter: Payer: Self-pay | Admitting: Physical Therapy

## 2017-10-22 ENCOUNTER — Ambulatory Visit: Payer: 59 | Admitting: Physical Therapy

## 2017-10-22 DIAGNOSIS — M542 Cervicalgia: Secondary | ICD-10-CM | POA: Diagnosis not present

## 2017-10-22 DIAGNOSIS — M6281 Muscle weakness (generalized): Secondary | ICD-10-CM

## 2017-10-22 DIAGNOSIS — R293 Abnormal posture: Secondary | ICD-10-CM

## 2017-10-22 NOTE — Therapy (Addendum)
[place], [place], [address] Phone: [phone]   Fax:  [phone]  Physical Therapy Treatment/ Discharge Summary  Patient Details  Name: Dwayne Rocha MRN: [id number] Date of Birth: 1976-10-09 Referring Provider: Dr. Ashok Pall    Encounter Date: 10/22/2017  PT End of Session - 10/22/17 1106    Visit Number  5    Number of Visits  12    Date for PT Re-Evaluation  11/12/17    PT Start Time  1059    PT Stop Time  1155    PT Time Calculation (min)  56 min    Activity Tolerance  Patient tolerated treatment well    Behavior During Therapy  [facility] for tasks assessed/performed       Past Medical History:  Diagnosis Date  . Anxiety   . Depression   . Diabetes mellitus   . Hypertension     Past Surgical History:  Procedure Laterality Date  . HIP SURGERY     "pins in hips"-"growth plate slipped"  . WRIST SURGERY      There were no vitals filed for this visit.  Subjective Assessment - 10/22/17 1102    Subjective  Pt. notes 4/10 pain in neck that has remained unchanged. Pt. is returning to work tomorrow and states he should have no difficulty as long as his neck "doesn't bother me too bad".      Currently in Pain?  Yes    Pain Score  4     Pain Location  Neck    Pain Orientation  Left    Pain Onset  More than a month ago       [facility] Adult PT Treatment/Exercise - 10/22/17 1109      Neck Exercises: Machines for Strengthening   UBE (Upper Arm Bike)  L3 x 6 minutes; 3 minutes forward, 3 minutes reverse, cues to maintain proper c-spine alignment      Neck Exercises: Supine   Other Supine Exercise  foam roll series, foam roller alligned vertically to spine behind pt. laying in supine, pt. performed flexion, bilateral scapular protraction, horizontal ab/aduction, "W's" UE butterfly motion, alternating scapular protraction, alternatiing backstroke motion; 20 reps each motion, 30-45 seconds rest between       Cryotherapy   Number Minutes Cryotherapy  10 Minutes    Cryotherapy Location  Cervical lt. side    Type of Cryotherapy  Ice pack      Manual Therapy   Manual therapy comments  skilled palpation, twitch response monitored throughout dry needling     Soft tissue mobilization  IASTM to lt. upper trap, levator scapulae, longissimus post DN for pain relief and to facilitate therapeutic exercise       Trigger Point Dry Needling - 10/22/17 1105    Consent Given?  Yes    Education Handout Provided  Yes    Muscles Treated Upper Body  Upper trapezius;Levator scapulae;Longissimus Left side     Upper Trapezius Response  Twitch reponse elicited    Levator Scapulae Response  Twitch response elicited    Longissimus Response  Twitch response elicited       PT Education - 10/22/17 1106    Education provided  Yes    Education Details  Pt. educated on rationale and theory of DN, expected repsonse to DN     Person(s) Educated  Patient    Methods  Explanation    Comprehension  Verbalized understanding       PT  Long Term Goals - 10/17/17 1026      PT LONG TERM GOAL #1   Title  Pt will be I with HEP for cervical stab and strength     Status  On-going      PT LONG TERM GOAL #2   Title  Pt will increase grip strength within 10-15 lbs of L UE     Status  Unable to assess      PT LONG TERM GOAL #3   Title  Pt will be able to rotate cervical spine in each direction with min pain < 3/10.     Status  On-going      PT LONG TERM GOAL #4   Title  FOTO score will improve 45% limited or less to demo improved functional mobiity.     Status  On-going      PT LONG TERM GOAL #5   Title  Pt will be able to sit without head support, corrected posture and pain minimal.     Status  On-going       Plan - 10/22/17 1153    Clinical Impression Statement  Pt. presents to therapy today with no significant improvement in pain, however is returning to work tomorrow (10/23/17). Session consisted of dry  needling with noted twitch responses. Pt. noted increased soreness after DN but was able to complete therapeutic exercise. Post-exercise pt. reported increased tingling in ulnar nerve distribution of RUE. He was taught ulnar nerve glide which reduced symptoms. Pt. received ice post-session.     PT Treatment/Interventions  ADLs/Self Care Home Management;Cryotherapy;Electrical Stimulation;Ultrasound;Moist Heat;Neuromuscular re-education;Passive range of motion;Dry needling;Patient/family education;Therapeutic activities;Therapeutic exercise    PT Next Visit Plan  DN, continue postural strength, cervical strength and ROM, (prefers cold to heat)     PT Home Exercise Plan  stab, posture, gentle AROM , supine scap bands     Consulted and Agree with Plan of Care  Patient       Patient will benefit from skilled therapeutic intervention in order to improve the following deficits and impairments:  Increased fascial restricitons, Impaired sensation, Pain, Improper body mechanics, Postural dysfunction, Decreased mobility, Decreased range of motion, Decreased strength, Impaired UE functional use, Obesity, Impaired flexibility, Increased edema  Visit Diagnosis: Cervicalgia  Muscle weakness (generalized)  Abnormal posture     Problem List Patient Active Problem List   Diagnosis Date Noted  . Severe episode of recurrent major depressive disorder, without psychotic features ([place])   . MDD (major depressive disorder) 06/10/2016  . DM (diabetes mellitus), type 2, uncontrolled ([place]) 10/02/2015  . Facial cellulitis 09/30/2015  . Anxiety 09/30/2015  . HTN (hypertension) 09/30/2015    Eulis Manly, SPT 10/22/17 12:06 PM   [place] [facility] [address] [place], [place], [address] Phone: [phone]   Fax:  [phone]  Name: Dwayne Rocha MRN: [id number] Date of Birth: [birth year]     PHYSICAL THERAPY DISCHARGE SUMMARY  Visits from Start of  Care: 5  Current functional level related to goals / functional outcomes: See goals   Remaining deficits:  unknown  Education / Equipment: HEP,  Plan: Patient agrees to discharge.  Patient goals were not met. Patient is being discharged due to not returning since the last visit.  ?????     Kristoffer Leamon PT, DPT, LAT, ATC  11/19/17  4:01 PM

## 2017-12-13 ENCOUNTER — Emergency Department (HOSPITAL_COMMUNITY): Payer: 59

## 2017-12-13 ENCOUNTER — Emergency Department (HOSPITAL_COMMUNITY)
Admission: EM | Admit: 2017-12-13 | Discharge: 2017-12-13 | Disposition: A | Discharge status: Home / Self Care | Payer: 59 | Attending: Emergency Medicine | Admitting: Emergency Medicine

## 2017-12-13 ENCOUNTER — Encounter (HOSPITAL_COMMUNITY): Payer: Self-pay | Admitting: Emergency Medicine

## 2017-12-13 DIAGNOSIS — I10 Essential (primary) hypertension: Secondary | ICD-10-CM | POA: Diagnosis not present

## 2017-12-13 DIAGNOSIS — R1011 Right upper quadrant pain: Secondary | ICD-10-CM

## 2017-12-13 DIAGNOSIS — R112 Nausea with vomiting, unspecified: Secondary | ICD-10-CM | POA: Insufficient documentation

## 2017-12-13 DIAGNOSIS — R0789 Other chest pain: Secondary | ICD-10-CM | POA: Diagnosis not present

## 2017-12-13 DIAGNOSIS — Z87891 Personal history of nicotine dependence: Secondary | ICD-10-CM | POA: Insufficient documentation

## 2017-12-13 DIAGNOSIS — K2970 Gastritis, unspecified, without bleeding: Secondary | ICD-10-CM | POA: Diagnosis not present

## 2017-12-13 DIAGNOSIS — Z79899 Other long term (current) drug therapy: Secondary | ICD-10-CM | POA: Diagnosis not present

## 2017-12-13 DIAGNOSIS — R079 Chest pain, unspecified: Secondary | ICD-10-CM

## 2017-12-13 DIAGNOSIS — E119 Type 2 diabetes mellitus without complications: Secondary | ICD-10-CM | POA: Insufficient documentation

## 2017-12-13 DIAGNOSIS — E1165 Type 2 diabetes mellitus with hyperglycemia: Secondary | ICD-10-CM

## 2017-12-13 DIAGNOSIS — Z794 Long term (current) use of insulin: Secondary | ICD-10-CM | POA: Diagnosis not present

## 2017-12-13 LAB — COMPREHENSIVE METABOLIC PANEL
ALT: 41 U/L (ref 17–63)
AST: 28 U/L (ref 15–41)
Albumin: 3.5 g/dL (ref 3.5–5.0)
Alkaline Phosphatase: 102 U/L (ref 38–126)
Anion gap: 13 (ref 5–15)
BUN: 14 mg/dL (ref 6–20)
CO2: 23 mmol/L (ref 22–32)
Calcium: 9 mg/dL (ref 8.9–10.3)
Chloride: 97 mmol/L — ABNORMAL LOW (ref 101–111)
Creatinine, Ser: 1.04 mg/dL (ref 0.61–1.24)
GFR calc Af Amer: >60 mL/min (ref >60)
GFR calc non Af Amer: >60 mL/min (ref >60)
Glucose, Bld: 321 mg/dL — ABNORMAL HIGH (ref 65–99)
Potassium: 4.3 mmol/L (ref 3.5–5.1)
Sodium: 133 mmol/L — ABNORMAL LOW (ref 135–145)
Total Bilirubin: 0.9 mg/dL (ref 0.3–1.2)
Total Protein: 7.4 g/dL (ref 6.5–8.1)

## 2017-12-13 LAB — CBC WITH DIFFERENTIAL/PLATELET
Basophils Absolute: 0 10*3/uL (ref 0.0–0.1)
Basophils Relative: 0 %
Eosinophils Absolute: 0 10*3/uL (ref 0.0–0.7)
Eosinophils Relative: 1 %
HCT: 44.9 % (ref 39.0–52.0)
Hemoglobin: 15.4 g/dL (ref 13.0–17.0)
Lymphocytes Relative: 37 %
Lymphs Abs: 2.1 10*3/uL (ref 0.7–4.0)
MCH: 29.9 pg (ref 26.0–34.0)
MCHC: 34.3 g/dL (ref 30.0–36.0)
MCV: 87.2 fL (ref 78.0–100.0)
Monocytes Absolute: 0.4 10*3/uL (ref 0.1–1.0)
Monocytes Relative: 7 %
Neutro Abs: 3.1 10*3/uL (ref 1.7–7.7)
Neutrophils Relative %: 55 %
Platelets: 239 10*3/uL (ref 150–400)
RBC: 5.15 MIL/uL (ref 4.22–5.81)
RDW: 12.7 % (ref 11.5–15.5)
WBC: 5.7 10*3/uL (ref 4.0–10.5)

## 2017-12-13 LAB — I-STAT TROPONIN, ED
Troponin i, poc: 0.02 ng/mL (ref 0.00–0.08)
Troponin i, poc: 0.01 ng/mL (ref 0.00–0.08)

## 2017-12-13 LAB — CBG MONITORING, ED: Glucose-Capillary: 239 mg/dL — ABNORMAL HIGH (ref 65–99)

## 2017-12-13 LAB — LIPASE, BLOOD: Lipase: 25 U/L (ref 11–51)

## 2017-12-13 MED ORDER — ONDANSETRON HCL 4 MG/2ML IJ SOLN
4.0000 mg | Freq: Once | INTRAMUSCULAR | Status: AC
  Administered 2017-12-13: 4 mg via INTRAVENOUS
  Filled 2017-12-13: qty 2

## 2017-12-13 MED ORDER — GI COCKTAIL ~~LOC~~
30.0000 mL | Freq: Once | ORAL | Status: AC
Start: 2017-12-13 — End: 2017-12-13
  Administered 2017-12-13: 30 mL via ORAL
  Filled 2017-12-13: qty 30

## 2017-12-13 MED ORDER — RANITIDINE HCL 150 MG PO TABS: 150.0000 mg | ORAL_TABLET | Freq: Two times a day (BID) | ORAL | 0 refills | Status: DC

## 2017-12-13 MED ORDER — SODIUM CHLORIDE 0.9 % IV BOLUS (SEPSIS)
1000.0000 mL | Freq: Once | INTRAVENOUS | Status: AC
  Administered 2017-12-13: 1000 mL via INTRAVENOUS

## 2017-12-13 MED ORDER — ONDANSETRON 4 MG PO TBDP: 4.0000 mg | ORAL_TABLET | Freq: Three times a day (TID) | ORAL | 0 refills | Status: DC | PRN

## 2017-12-13 MED ORDER — IOPAMIDOL (ISOVUE-370) INJECTION 76%
INTRAVENOUS | Status: AC
  Administered 2017-12-13: 100 mL via INTRAVENOUS
  Filled 2017-12-13: qty 100

## 2017-12-13 MED ORDER — FAMOTIDINE IN NACL 20-0.9 MG/50ML-% IV SOLN
20.0000 mg | Freq: Once | INTRAVENOUS | Status: AC
  Administered 2017-12-13: 20 mg via INTRAVENOUS
  Filled 2017-12-13: qty 50

## 2017-12-13 MED ORDER — MORPHINE SULFATE (PF) 4 MG/ML IV SOLN
4.0000 mg | Freq: Once | INTRAVENOUS | Status: AC
Start: 2017-12-13 — End: 2017-12-13
  Administered 2017-12-13: 4 mg via INTRAVENOUS
  Filled 2017-12-13: qty 1

## 2017-12-13 NOTE — ED Notes (Signed)
Pt transported to Ultrasound.  

## 2017-12-13 NOTE — Discharge Instructions (Addendum)
Your labs, xray, CAT scan, and EKG are all reassuring, your chest pain could be a variety of things including rib pain, gas pain, muscle pain, gastrointestinal related pain, or other nonemergent issues. Your abdominal pain is likely from gastritis or an ulcer. You will need to take zantac as directed, and avoid spicy/fatty/acidic foods, avoid soda/coffee/tea/alcohol. Avoid laying down flat within 30 minutes of eating. Avoid NSAIDs like ibuprofen/aleve/motrin/etc on an empty stomach. May consider using over the counter tums/maalox as needed for additional relief. Use zofran as directed as needed for nausea. Stay well hydrated. Use tylenol as needed for pain. You may consider using heat to the areas of pain, no more than 20 minutes every hour. If your symptoms persist, then your regular doctor may want to consider getting a HIDA scan to further evaluation your gallbladder. Follow up with your regular doctor in 5-7 days for recheck of symptoms. Return to the ER for changes or worsening symptoms.   SEEK IMMEDIATE MEDICAL ATTENTION IF: You develop a fever.  Your chest pains become severe or intolerable.  You develop new, unexplained symptoms (problems).  You develop shortness of breath, nausea, vomiting, sweating or feel light headed.  You develop a new cough or you cough up blood. You develop new leg swelling

## 2017-12-13 NOTE — ED Notes (Signed)
Pt returned from CT °

## 2017-12-13 NOTE — ED Provider Notes (Signed)
Patient awakened this morning with right-sided anterior chest pain.  Pain has since moved to right upper quadrant.  He vomited one time this morning.  He ate grilled shrimp last night.  No fever.  No shortness of breath.  He is presently hungry no other associated symptoms.  On exam he is alert Glasgow Coma Score 15 HEENT exam no facial asymmetry neck supple trachea midline no bruit lungs clear to auscultation heart regular rate and rhythm abdomen obese, mildly tender at right upper quadrant no guarding rigidity or rebound.  Extremities without edema.  Skin warm dry   Doug SouJacubowitz, [place] Gadbois, MD 12/13/17 [phone]

## 2017-12-13 NOTE — ED Notes (Signed)
Pt back from Ultrasound

## 2017-12-13 NOTE — ED Notes (Signed)
Patient transported to CT 

## 2017-12-13 NOTE — ED Notes (Signed)
Pt returned from xray; ED Provider at bedside.

## 2017-12-13 NOTE — ED Provider Notes (Signed)
[facility] EMERGENCY DEPARTMENT Provider Note   CSN: [id number] Arrival date & time: 12/13/17  1350     History   Chief Complaint Chief Complaint  Patient presents with  . Chest Pain    HPI Dwayne Rocha is a 42 y.o. male with a PMHx of anxiety, depression, DM2, and HTN, who presents to the ED with complaints of nausea and vomiting that began this morning.  Patient states that when he woke up to get ready for work, he was nauseated.  When he arrived at work and got dressed around 6:20 AM, he had one episode of nonbloody nonbilious emesis.  Shortly after that he developed right-sided chest pain and abdominal pain.  He describes the pain as 7/10 constant soreness with occasional sharp and shooting pain in the right chest, which radiates to his right shoulder and right upper quadrant, worse with movement and breathing, and with no treatments tried prior to arrival.  Directly after vomiting he did feel slightly lightheaded like he needed to sit down.  He is a non-smoker with no family history of cardiac disease.  His PCP is Dr. Concepcion Elk.  Of note, he states he think his heart rate has always been high in the past.   He denies diaphoresis, fevers, chills, cough, SOB, LE swelling, recent prolonged travel/surgery/immobilization, personal/family hx of DVT/PE, diarrhea, constipation, obstipation, melena, hematochezia, hematemesis, testicular pain/swelling, penile discharge, hematuria, dysuria, myalgias, arthralgias, claudication, orthopnea, numbness, tingling, focal weakness, or any other complaints at this time.  He denies any sick contacts, suspicious food intake, EtOH use, frequent NSAID use, or prior abdominal surgeries.    The history is provided by the patient and medical records. No language interpreter was used.  Chest Pain   This is a new problem. The current episode started 6 to 12 hours ago. The problem occurs constantly. The problem has not changed since onset.Associated  with: vomiting. The pain is present in the lateral region. The pain is at a severity of 7/10. The pain is moderate. The quality of the pain is described as sharp (soreness and occasional sharp/shooting). The pain radiates to the right shoulder (and RUQ). Duration of episode(s) is 8 hours. Exacerbated by: movement and breathing. Associated symptoms include abdominal pain, nausea and vomiting. Pertinent negatives include no claudication, no cough, no diaphoresis, no fever, no lower extremity edema, no numbness, no orthopnea, no shortness of breath and no weakness. He has tried nothing for the symptoms. The treatment provided no relief. Risk factors include male gender and obesity.  His past medical history is significant for diabetes and hypertension.  Pertinent negatives for past medical history include no DVT and no PE.  Pertinent negatives for family medical history include: no CAD, no early MI and no PE.    Past Medical History:  Diagnosis Date  . Anxiety   . Depression   . Diabetes mellitus   . Hypertension     Patient Active Problem List   Diagnosis Date Noted  . Severe episode of recurrent major depressive disorder, without psychotic features (HCC)   . MDD (major depressive disorder) 06/10/2016  . DM (diabetes mellitus), type 2, uncontrolled (HCC) 10/02/2015  . Facial cellulitis 09/30/2015  . Anxiety 09/30/2015  . HTN (hypertension) 09/30/2015    Past Surgical History:  Procedure Laterality Date  . HIP SURGERY     "pins in hips"-"growth plate slipped"  . WRIST SURGERY         Home Medications    Prior to Admission medications  Medication Sig Start Date End Date Taking? Authorizing Provider  gabapentin (NEURONTIN) 100 MG capsule Take 100 mg by mouth 3 (three) times daily.    [provider]  insulin glargine (LANTUS) 100 UNIT/ML injection Inject 0.08 mLs (8 Units total) into the skin at bedtime. 06/17/16   Oneta RackLewis, Tanika N, NP  metFORMIN (GLUCOPHAGE) 1000 MG tablet  Take 1 tablet (1,000 mg total) by mouth 2 (two) times daily with a meal. 06/17/16   Oneta RackLewis, Tanika N, NP  sertraline (ZOLOFT) 100 MG tablet Take 1 tablet (100 mg total) by mouth daily. 06/17/16   Oneta RackLewis, Tanika N, NP    Family History History reviewed. No pertinent family history.  Social History Social History   Tobacco Use  . Smoking status: Former Smoker    Packs/day: 0.50    Years: 12.00    Pack years: 6.00    Types: Cigarettes  . Smokeless tobacco: Current User  Substance Use Topics  . Alcohol use: Yes    Comment: about 6x a year  . Drug use: No     Allergies   Patient has no known allergies.   Review of Systems Review of Systems  Constitutional: Negative for chills, diaphoresis and fever.  Respiratory: Negative for cough and shortness of breath.   Cardiovascular: Positive for chest pain. Negative for orthopnea, claudication and leg swelling.  Gastrointestinal: Positive for abdominal pain, nausea and vomiting. Negative for blood in stool, constipation and diarrhea.  Genitourinary: Negative for discharge, dysuria, hematuria, scrotal swelling and testicular pain.  Musculoskeletal: Negative for arthralgias and myalgias.  Skin: Negative for color change.  Allergic/Immunologic: Positive for immunocompromised state (DM2).  Neurological: Positive for light-headedness. Negative for weakness and numbness.  Psychiatric/Behavioral: Negative for confusion.   All other systems reviewed and are negative for acute change except as noted in the HPI.    Physical Exam Updated Vital Signs BP (!) 160/101   Pulse (!) 109   Temp 98.8 F (37.1 C) (Oral)   Resp 10   Ht 5\' 11"  (1.803 m)   Wt (!) 137.9 kg (304 lb)   SpO2 99%   BMI 42.40 kg/m    Physical Exam  Constitutional: He is oriented to person, place, and time. Vital signs are normal. He appears well-developed and well-nourished.  Non-toxic appearance. No distress.  Afebrile, nontoxic, NAD. Mildly tachycardic which appears to be  a chronic finding  HENT:  Head: Normocephalic and atraumatic.  Mouth/Throat: Oropharynx is clear and moist and mucous membranes are normal.  Eyes: Conjunctivae and EOM are normal. Right eye exhibits no discharge. Left eye exhibits no discharge.  Neck: Normal range of motion. Neck supple.  Cardiovascular: Regular rhythm, normal heart sounds and intact distal pulses. Tachycardia present. Exam reveals no gallop and no friction rub.  No murmur heard. Mildly tachycardic in the low 100s, which appears chronic based on prior visits. Reg rhythm, nl s1/s2, no m/r/g, distal pulses intact, no pedal edema   Pulmonary/Chest: Effort normal and breath sounds normal. No respiratory distress. He has no decreased breath sounds. He has no wheezes. He has no rhonchi. He has no rales. He exhibits tenderness. He exhibits no crepitus, no deformity and no retraction.  CTAB in all lung fields, no w/r/r, no hypoxia or increased WOB, speaking in full sentences, SpO2 99% on RA Chest wall with mild R sided anterior TTP over R pectoralis muscle area, without crepitus, deformities, or retractions     Abdominal: Soft. Normal appearance and bowel sounds are normal. He exhibits no  distension. There is tenderness in the right upper quadrant and epigastric area. There is positive Murphy's sign. There is no rigidity, no rebound, no guarding, no CVA tenderness and no tenderness at McBurney's point.  Soft, obese and protuberant but nondistended, +BS throughout, with moderate RUQ and epigastric TTP, no r/g/r, +murphy's, neg mcburney's, no CVA TTP   Musculoskeletal: Normal range of motion.  MAE x4 Strength and sensation grossly intact in all extremities Distal pulses intact No pedal edema, neg homan's bilaterally   Neurological: He is alert and oriented to person, place, and time. He has normal strength. No sensory deficit.  Skin: Skin is warm, dry and intact. No rash noted.  Psychiatric: He has a normal mood and affect.  Nursing  note and vitals reviewed.    ED Treatments / Results  Labs (all labs ordered are listed, but only abnormal results are displayed) Labs Reviewed  COMPREHENSIVE METABOLIC PANEL - Abnormal; Notable for the following components:      Result Value   Sodium 133 (*)    Chloride 97 (*)    Glucose, Bld 321 (*)    All other components within normal limits  CBG MONITORING, ED - Abnormal; Notable for the following components:   Glucose-Capillary 239 (*)    All other components within normal limits  LIPASE, BLOOD  CBC WITH DIFFERENTIAL/PLATELET  I-STAT TROPONIN, ED  I-STAT TROPONIN, ED    EKG  EKG Interpretation  Date/Time:  Saturday December 13 2017 13:56:59 EST Ventricular Rate:  118 PR Interval:  146 QRS Duration: 66 QT Interval:  318 QTC Calculation: 445 R Axis:   71 Text Interpretation:  Sinus tachycardia Otherwise normal ECG SINCE LAST TRACING HEART RATE HAS INCREASED Confirmed by Doug Sou [phone]) on 12/13/2017 3:22:29 PM       Radiology Dg Chest 2 View  Result Date: 12/13/2017 CLINICAL DATA:  Right chest pain and episode of vomiting this morning. Increased pain with movement. EXAM: CHEST  2 VIEW COMPARISON:  05/25/2011. FINDINGS: Normal sized heart. Clear lungs with normal vascularity. Cervical spine fixation hardware. IMPRESSION: No acute abnormality. Electronically Signed   By: Beckie Salts M.D.   On: 12/13/2017 14:29   US Abdomen Complete  Result Date: 12/13/2017 CLINICAL DATA:  Right upper quadrant and epigastric pain. EXAM: ABDOMEN ULTRASOUND COMPLETE COMPARISON:  None. FINDINGS: Gallbladder: No gallstones or wall thickening visualized. No sonographic Murphy sign noted by sonographer. Common bile duct: Diameter: 3.9 mm. Partially obscured by bowel gas. Liver: No focal lesion identified. Mildly increased parenchymal echogenicity. Portal vein is patent on color Doppler imaging with normal direction of blood flow towards the liver. IVC: Normal where seen. Pancreas: Not  seen secondary to overlying bowel gas. Spleen: Size and appearance within normal limits. Right Kidney: Length: 12.0 cm. Echogenicity within normal limits. No mass or hydronephrosis visualized. Left Kidney: Length: 12.8 cm. Echogenicity within normal limits. No mass or hydronephrosis visualized. Abdominal aorta: No aneurysm visualized. Other findings: None. IMPRESSION: Diffusely increased parenchymal echogenicity of the liver, usually seen with hepatic steatosis or fibrosis. No evidence of cholelithiasis or acute cholecystitis. Study somewhat limited by overlying bowel gas. Electronically Signed   By: Ted Mcalpine M.D.   On: 12/13/2017 15:50   Ct Angio Chest/abd/pel For Dissection W And/or Wo Contrast  Result Date: 12/13/2017 CLINICAL DATA:  Right-sided chest pain.  Abdominal cramping, emesis. EXAM: CT ANGIOGRAPHY CHEST, ABDOMEN AND PELVIS TECHNIQUE: Multidetector CT imaging through the chest, abdomen and pelvis was performed using the standard protocol during bolus administration of intravenous  contrast. Multiplanar reconstructed images and MIPs were obtained and reviewed to evaluate the vascular anatomy. CONTRAST:  ISOVUE-370 IOPAMIDOL (ISOVUE-370) INJECTION 76% COMPARISON:  Chest radiograph 12/13/2017 FINDINGS: CTA CHEST FINDINGS Cardiovascular: Preferential opacification of the thoracic aorta. No evidence of thoracic aortic aneurysm or dissection. Normal heart size. No pericardial effusion. Mediastinum/Nodes: No enlarged mediastinal, hilar, or axillary lymph nodes. Thyroid gland, trachea, and esophagus demonstrate no significant findings. Lungs/Pleura: Lungs are clear. No pleural effusion or pneumothorax. Musculoskeletal: No chest wall abnormality. Lower cervical spine fusion. No acute or significant osseous findings. Review of the MIP images confirms the above findings. CTA ABDOMEN AND PELVIS FINDINGS VASCULAR Aorta: Normal caliber aorta without aneurysm, dissection, vasculitis or significant  stenosis. Celiac: Patent without evidence of aneurysm, dissection, vasculitis or significant stenosis. SMA: Patent without evidence of aneurysm, dissection, vasculitis or significant stenosis. Renals: Both renal arteries are patent without evidence of aneurysm, dissection, vasculitis, fibromuscular dysplasia or significant stenosis. IMA: Patent without evidence of aneurysm, dissection, vasculitis or significant stenosis. Inflow: Patent without evidence of aneurysm, dissection, vasculitis or significant stenosis. Veins: No obvious venous abnormality within the limitations of this arterial phase study. Review of the MIP images confirms the above findings. NON-VASCULAR Hepatobiliary: No focal liver abnormality is seen. No gallstones, gallbladder wall thickening, or biliary dilatation. Pancreas: Unremarkable. No pancreatic ductal dilatation or surrounding inflammatory changes. Spleen: Normal in size without focal abnormality. Adrenals/Urinary Tract: Adrenal glands are unremarkable. Kidneys are normal, without renal calculi, focal lesion, or hydronephrosis. Bladder is unremarkable. Stomach/Bowel: Stomach is within normal limits. Appendix appears normal. No evidence of bowel wall thickening, distention, or inflammatory changes. Lymphatic: No significant vascular findings are present. No enlarged abdominal or pelvic lymph nodes. Reproductive: Prostate is unremarkable. Other: No abdominal wall hernia or abnormality. No abdominopelvic ascites. Musculoskeletal: No acute or significant osseous findings. Head bilateral femoral head pinning with intact hardware. Review of the MIP images confirms the above findings. IMPRESSION: No evidence of aortic dissection or other acute vascular abnormality. No acute findings within the chest, abdomen or pelvis. Electronically Signed   By: Ted Mcalpine M.D.   On: 12/13/2017 17:49    Procedures Procedures (including critical care time)  Medications Ordered in ED Medications    ondansetron (ZOFRAN) injection 4 mg (not administered)  sodium chloride 0.9 % bolus 1,000 mL (0 mLs Intravenous Stopped 12/13/17 1548)  ondansetron (ZOFRAN) injection 4 mg (4 mg Intravenous Given 12/13/17 1453)  gi cocktail (Maalox,Lidocaine,Donnatal) (30 mLs Oral Given 12/13/17 1457)  famotidine (PEPCID) IVPB 20 mg premix (0 mg Intravenous Stopped 12/13/17 1548)  morphine 4 MG/ML injection 4 mg (4 mg Intravenous Given 12/13/17 1454)  iopamidol (ISOVUE-370) 76 % injection (100 mLs Intravenous Contrast Given 12/13/17 1651)     Initial Impression / Assessment and Plan / ED Course  I have reviewed the triage vital signs and the nursing notes.  Pertinent labs & imaging results that were available during my care of the patient were reviewed by me and considered in my medical decision making (see chart for details).     42 y.o. male here with n/v this morning, then that caused R sided chest pain and RUQ pain and lightheadedness after he finished vomiting. On exam, mild R chest wall TTP, moderate RUQ and epigastric TTP, +murphy's, nonperitoneal otherwise. No pedal edema, no hypoxia, neg homan's sign. Mildly tachycardic in the low 100s, which appears to be a chronic finding based on chart review of multiple years of visits. EKG nonischemic. CXR negative. Will get labs and abdominal ultrasound  to evaluate for gallbladder/GI pathology. I doubt PE as a cause, he has no risk factors and his only positive PERC rule finding is the mild tachycardia, but again that is chronic; more likely GI vs MsK related. Will hold off on D-dimer testing for now. Will give pain meds, nausea meds, GI cocktail, pepcid, and fluids, then reassess shortly.   4:13 PM CBC w/diff unremarkable. CMP with hyperglycemia 321 but no anion gap or abnormal bicarb; mildly low Na 133 which corrects for hyperglycemia; otherwise remainder of CMP WNL. Lipase negative. Trop negative at 8.5hr mark from onset. Abd U/S with increased parenchymal echogenicity of  liver usually seen with hepatic steatosis or fibrosis, no cholelithiasis/cholecystits, although study limited due to overlying bowel gas. HR improving with fluids. Pt feeling better, however my attending Dr. Ethelda Chick saw pt and would like to have CTA dissection study ordered to rule that out. Will proceed with this. Will reassess shortly.   7:11 PM CTA chest/abd/pelv negative for dissection, PE, or other acute finding. Repeat troponin at 3hr mark from first one, which is now at the 12+hr mark, is negative and actually lower than first one (0.01 now, previously 0.02). Repeat CBG improving down to 239 after fluids. Pt feeling better and tolerating PO well. Requesting more nausea med since they gave him a [place] sandwich, although he's kept it down; it's been several hours since his zofran, will give another dose now. Overall, symptoms consistent with gastritis/GERD and chest wall pain from vomiting; doubt dissection, PE, mallory-weiss tear, or other acute cardiopulmonary etiology. Discussed diet/lifestyle modifications for symptoms, will start on zantac/zofran, advised tylenol and avoidance/sparing use of NSAIDs only on full stomach, discussed other OTC remedies for symptomatic relief, and f/up with PCP in 5-7 days for recheck of symptoms and ongoing evaluation/management. Could still be biliary dyskinesia, advised possible further outpatient work up with HIDA scan if symptoms persist; f/up with PCP for this. I explained the diagnosis and have given explicit precautions to return to the ER including for any other new or worsening symptoms. The patient understands and accepts the medical plan as it's been dictated and I have answered their questions. Discharge instructions concerning home care and prescriptions have been given. The patient is STABLE and is discharged to home in good condition.      Final Clinical Impressions(s) / ED Diagnoses   Final diagnoses:  Right-sided chest pain  Abdominal pain, RUQ    Nausea and vomiting in adult patient  Type 2 diabetes mellitus with hyperglycemia, with long-term current use of insulin (HCC)  Gastritis, presence of bleeding unspecified, unspecified chronicity, unspecified gastritis type    ED Discharge Orders        Ordered    ranitidine (ZANTAC) 150 MG tablet  2 times daily     12/13/17 1912    ondansetron (ZOFRAN ODT) 4 MG disintegrating tablet  Every 8 hours PRN     12/13/17 [address], [place], [place] 12/13/17 1912    Doug Sou, MD 12/13/17 2358

## 2017-12-13 NOTE — ED Notes (Signed)
Pt transported to XRay 

## 2017-12-13 NOTE — ED Triage Notes (Signed)
Pt presents to ED for right sided chest pain after an episode of emesis this morning.  States increased pain with movement.  Also c/o abdominal cramping.  Denies diarrhea, denies SOB

## 2017-12-13 NOTE — ED Notes (Signed)
Patient transported to Ultrasound 

## 2018-05-16 ENCOUNTER — Encounter (HOSPITAL_COMMUNITY): Payer: Self-pay | Admitting: Emergency Medicine

## 2018-05-16 ENCOUNTER — Emergency Department (HOSPITAL_COMMUNITY)
Admission: EM | Admit: 2018-05-16 | Discharge: 2018-05-17 | Disposition: A | Discharge status: Home / Self Care | Payer: 59 | Attending: Emergency Medicine | Admitting: Emergency Medicine

## 2018-05-16 DIAGNOSIS — Z79899 Other long term (current) drug therapy: Secondary | ICD-10-CM | POA: Insufficient documentation

## 2018-05-16 DIAGNOSIS — M79672 Pain in left foot: Secondary | ICD-10-CM | POA: Diagnosis present

## 2018-05-16 DIAGNOSIS — Z794 Long term (current) use of insulin: Secondary | ICD-10-CM | POA: Insufficient documentation

## 2018-05-16 DIAGNOSIS — L03116 Cellulitis of left lower limb: Secondary | ICD-10-CM

## 2018-05-16 DIAGNOSIS — I10 Essential (primary) hypertension: Secondary | ICD-10-CM | POA: Insufficient documentation

## 2018-05-16 DIAGNOSIS — Z87891 Personal history of nicotine dependence: Secondary | ICD-10-CM | POA: Insufficient documentation

## 2018-05-16 DIAGNOSIS — E119 Type 2 diabetes mellitus without complications: Secondary | ICD-10-CM | POA: Diagnosis not present

## 2018-05-16 LAB — CBG MONITORING, ED: Glucose-Capillary: 257 mg/dL — ABNORMAL HIGH (ref 70–99)

## 2018-05-16 NOTE — ED Triage Notes (Addendum)
Pt presents with pain and swelling to L foot x 4-5 days, pain worse today and radiating to calf.  Pts wife states pt needs Insulin asap, pt states he has not had insulin since February d/t scheduling conflict and Insulin not covered by insurance.  Pt states pain today is a new pain, sharp and stabbing vs normal neuropathy

## 2018-05-17 ENCOUNTER — Emergency Department (HOSPITAL_BASED_OUTPATIENT_CLINIC_OR_DEPARTMENT_OTHER): Payer: 59

## 2018-05-17 DIAGNOSIS — R609 Edema, unspecified: Secondary | ICD-10-CM | POA: Diagnosis not present

## 2018-05-17 LAB — CBC WITH DIFFERENTIAL/PLATELET
Abs Immature Granulocytes: 0 10*3/uL (ref 0.0–0.1)
Basophils Absolute: 0 10*3/uL (ref 0.0–0.1)
Basophils Relative: 1 %
Eosinophils Absolute: 0.2 10*3/uL (ref 0.0–0.7)
Eosinophils Relative: 3 %
HCT: 42.3 % (ref 39.0–52.0)
Hemoglobin: 13.9 g/dL (ref 13.0–17.0)
Immature Granulocytes: 0 %
Lymphocytes Relative: 29 %
Lymphs Abs: 1.8 10*3/uL (ref 0.7–4.0)
MCH: 29.2 pg (ref 26.0–34.0)
MCHC: 32.9 g/dL (ref 30.0–36.0)
MCV: 88.9 fL (ref 78.0–100.0)
Monocytes Absolute: 0.6 10*3/uL (ref 0.1–1.0)
Monocytes Relative: 10 %
Neutro Abs: 3.5 10*3/uL (ref 1.7–7.7)
Neutrophils Relative %: 57 %
Platelets: 221 10*3/uL (ref 150–400)
RBC: 4.76 MIL/uL (ref 4.22–5.81)
RDW: 11.9 % (ref 11.5–15.5)
WBC: 6 10*3/uL (ref 4.0–10.5)

## 2018-05-17 LAB — BASIC METABOLIC PANEL
Anion gap: 7 (ref 5–15)
BUN: 7 mg/dL (ref 6–20)
CO2: 27 mmol/L (ref 22–32)
Calcium: 9 mg/dL (ref 8.9–10.3)
Chloride: 101 mmol/L (ref 98–111)
Creatinine, Ser: 0.9 mg/dL (ref 0.61–1.24)
GFR calc Af Amer: >60 mL/min (ref >60)
GFR calc non Af Amer: >60 mL/min (ref >60)
Glucose, Bld: 246 mg/dL — ABNORMAL HIGH (ref 70–99)
Potassium: 4.1 mmol/L (ref 3.5–5.1)
Sodium: 135 mmol/L (ref 135–145)

## 2018-05-17 LAB — CBG MONITORING, ED: Glucose-Capillary: 229 mg/dL — ABNORMAL HIGH (ref 70–99)

## 2018-05-17 MED ORDER — SODIUM CHLORIDE 0.9 % IV BOLUS
500.0000 mL | Freq: Once | INTRAVENOUS | Status: AC
  Administered 2018-05-17: 500 mL via INTRAVENOUS

## 2018-05-17 MED ORDER — HYDROMORPHONE HCL 1 MG/ML IJ SOLN
0.5000 mg | Freq: Once | INTRAMUSCULAR | Status: AC
Start: 2018-05-17 — End: 2018-05-17
  Administered 2018-05-17: 0.5 mg via INTRAVENOUS
  Filled 2018-05-17: qty 1

## 2018-05-17 MED ORDER — CLINDAMYCIN PHOSPHATE 900 MG/50ML IV SOLN
900.0000 mg | Freq: Once | INTRAVENOUS | Status: AC
  Administered 2018-05-17: 900 mg via INTRAVENOUS
  Filled 2018-05-17: qty 50

## 2018-05-17 MED ORDER — ACETAMINOPHEN 500 MG PO TABS
1000.0000 mg | ORAL_TABLET | Freq: Once | ORAL | Status: AC
  Administered 2018-05-17: 1000 mg via ORAL
  Filled 2018-05-17: qty 2

## 2018-05-17 MED ORDER — CLINDAMYCIN HCL 150 MG PO CAPS: 450.0000 mg | ORAL_CAPSULE | Freq: Three times a day (TID) | ORAL | 0 refills | Status: DC

## 2018-05-17 NOTE — Progress Notes (Signed)
VASCULAR LAB PRELIMINARY  PRELIMINARY  PRELIMINARY  PRELIMINARY  Left lower extremity venous duplex completed.    Preliminary report:  There is no DVT or SVT noted in the left lower extremity.  There is interstitial fluid noted in the left calf.   Called results   Dennard Vezina, RVT 05/17/2018, 8:17 AM

## 2018-05-17 NOTE — ED Provider Notes (Signed)
[facility] EMERGENCY DEPARTMENT Provider Note   CSN: [id number] Arrival date & time: 05/16/18  2250     History   Chief Complaint Chief Complaint  Patient presents with  . Foot Pain    HPI Dwayne Rocha is a 42 y.o. male.  Patient with a history of IDDM, off insulin x 6 months, diabetic neuropathy presents with painful, swollen, red left foot x 4-5 days. No injury. No sores, lesions or ulcerations. No fever, nausea, vomiting. The swelling extends from the foot to the distal LE. No calf pain, SOB, CP.   The history is provided by the patient. No language interpreter was used.  Foot Pain  Pertinent negatives include no chest pain, no abdominal pain and no shortness of breath.    Past Medical History:  Diagnosis Date  . Anxiety   . Depression   . Diabetes mellitus   . Hypertension     Patient Active Problem List   Diagnosis Date Noted  . Severe episode of recurrent major depressive disorder, without psychotic features (HCC)   . MDD (major depressive disorder) 06/10/2016  . DM (diabetes mellitus), type 2, uncontrolled (HCC) 10/02/2015  . Facial cellulitis 09/30/2015  . Anxiety 09/30/2015  . HTN (hypertension) 09/30/2015    Past Surgical History:  Procedure Laterality Date  . HIP SURGERY     "pins in hips"-"growth plate slipped"  . WRIST SURGERY          Home Medications    Prior to Admission medications   Medication Sig Start Date End Date Taking? Authorizing Provider  gabapentin (NEURONTIN) 100 MG capsule Take 100 mg by mouth 3 (three) times daily.    [provider]  insulin glargine (LANTUS) 100 UNIT/ML injection Inject 0.08 mLs (8 Units total) into the skin at bedtime. Patient taking differently: Inject 30 Units into the skin at bedtime.  06/17/16   Oneta Rack, NP  metFORMIN (GLUCOPHAGE) 1000 MG tablet Take 1 tablet (1,000 mg total) by mouth 2 (two) times daily with a meal. 06/17/16   Oneta Rack, NP  ondansetron (ZOFRAN  ODT) 4 MG disintegrating tablet Take 1 tablet (4 mg total) by mouth every 8 (eight) hours as needed for nausea or vomiting. 12/13/17   Street, Mercedes, PA-C  ranitidine (ZANTAC) 150 MG tablet Take 1 tablet (150 mg total) by mouth 2 (two) times daily. 12/13/17   Street, [place], PA-C  sertraline (ZOLOFT) 100 MG tablet Take 1 tablet (100 mg total) by mouth daily. 06/17/16   Oneta Rack, NP    Family History No family history on file.  Social History Social History   Tobacco Use  . Smoking status: Former Smoker    Packs/day: 0.50    Years: 12.00    Pack years: 6.00    Types: Cigarettes  . Smokeless tobacco: Current User  Substance Use Topics  . Alcohol use: Yes    Comment: about 6x a year  . Drug use: No     Allergies   Patient has no known allergies.   Review of Systems Review of Systems  Constitutional: Negative for chills and fever.  Respiratory: Negative.  Negative for shortness of breath.   Cardiovascular: Negative.  Negative for chest pain.  Gastrointestinal: Negative.  Negative for abdominal pain, nausea and vomiting.  Musculoskeletal:       See HPI.  Skin: Positive for color change. Negative for wound.  Neurological: Negative.      Physical Exam Updated Vital Signs BP 140/90  Pulse 95   Temp 99.3 F (37.4 C) (Oral)   Resp 18   Ht 5\' 11"  (1.803 m)   Wt (!) 139.7 kg (308 lb)   SpO2 98%   BMI 42.96 kg/m   Physical Exam  Constitutional: He is oriented to person, place, and time. He appears well-developed and well-nourished.  Neck: Normal range of motion.  Pulmonary/Chest: Effort normal.  Musculoskeletal: Normal range of motion.  Left foot and distal LE erythematous, swollen without pitting, slightly warm to touch. No calf tenderness. Right leg unremarkable in appearance.   Neurological: He is alert and oriented to person, place, and time.  Skin: Skin is warm and dry. There is erythema.  Psychiatric: He has a normal mood and affect.     ED  Treatments / Results  Labs (all labs ordered are listed, but only abnormal results are displayed) Labs Reviewed  CBG MONITORING, ED - Abnormal; Notable for the following components:      Result Value   Glucose-Capillary 257 (*)    All other components within normal limits  CBC WITH DIFFERENTIAL/PLATELET  BASIC METABOLIC PANEL    EKG None  Radiology No results found.  Procedures Procedures (including critical care time)  Medications Ordered in ED Medications  sodium chloride 0.9 % bolus 500 mL (has no administration in time range)  HYDROmorphone (DILAUDID) injection 0.5 mg (has no administration in time range)  clindamycin (CLEOCIN) IVPB 900 mg (has no administration in time range)     Initial Impression / Assessment and Plan / ED Course  I have reviewed the triage vital signs and the nursing notes.  Pertinent labs & imaging results that were available during my care of the patient were reviewed by me and considered in my medical decision making (see chart for details).     Patient with a history of IDDM, noncompliant with insulin x 6 months, diabetic neuropathy, presents with left soot pain, swelling and redness. No skin breakdown.  CBG 257. Labs pending in evaluation of cellulitis  IVF's ordered, IV Clinda for infection. Pain medicaiton provided for comfort.   Doppler study ordered for am to r/o DVT as cause of LE swelling. If negative, can discharge home with Rx Clinda.   Blood sugar has been only mildly elevated. Do not feel he requires immediate blood sugar control and he is referred to PCP for recheck and further monitoring, as well as recheck of the LE swelling.   Patient care signed out to Terance HartKelly Gekas, PA-C, pending DVT study.  Final Clinical Impressions(s) / ED Diagnoses   Final diagnoses:  None   1. LE pain, left  ED Discharge Orders    None       Elpidio AnisUpstill, Rheda Kassab, Cordelia Poche-C 05/17/18 [id number]    Shaune PollackIsaacs, Cameron, MD 05/17/18 [phone]

## 2018-05-17 NOTE — ED Provider Notes (Signed)
42 year old male with untreated DM with concern for L foot infection for 4-5 days. He is febrile. Labs are reassuring. He was given Clindamycin, fluids, pain medicine, Tylenol. Plan is to obtain DVT study however source is most likely infection.   8:57 AM DVT study is negative. Discussed results with patient. He is asking for a prescription for another long acting insulin because his insurance no longer covers Lantus. I advised him he needs to see his PCP regarding this. Offered a refill of Metformin - he states he already has this. He was given rx for Clindamycin and strict return precautions.   Bethel BornGekas, Kelly Marie, PA-C 05/17/18 [phone]    Wynetta FinesMessick, Peter C, MD 05/17/18 [phone]

## 2018-05-17 NOTE — Discharge Instructions (Signed)
Take Clindamycin three times daily for skin infection Please return if you are worsening (vomiting, redness is spreading) Follow up with your doctor to restart your insulin

## 2018-05-23 ENCOUNTER — Emergency Department (HOSPITAL_COMMUNITY): Payer: 59

## 2018-05-23 ENCOUNTER — Emergency Department (HOSPITAL_COMMUNITY)
Admission: EM | Admit: 2018-05-23 | Discharge: 2018-05-24 | Disposition: A | Discharge status: Home / Self Care | Payer: 59 | Attending: Emergency Medicine | Admitting: Emergency Medicine

## 2018-05-23 ENCOUNTER — Other Ambulatory Visit: Payer: Self-pay

## 2018-05-23 ENCOUNTER — Encounter (HOSPITAL_COMMUNITY): Payer: Self-pay

## 2018-05-23 DIAGNOSIS — E119 Type 2 diabetes mellitus without complications: Secondary | ICD-10-CM | POA: Insufficient documentation

## 2018-05-23 DIAGNOSIS — S99922A Unspecified injury of left foot, initial encounter: Secondary | ICD-10-CM | POA: Diagnosis present

## 2018-05-23 DIAGNOSIS — Y929 Unspecified place or not applicable: Secondary | ICD-10-CM | POA: Insufficient documentation

## 2018-05-23 DIAGNOSIS — Z79899 Other long term (current) drug therapy: Secondary | ICD-10-CM | POA: Insufficient documentation

## 2018-05-23 DIAGNOSIS — Y999 Unspecified external cause status: Secondary | ICD-10-CM | POA: Diagnosis not present

## 2018-05-23 DIAGNOSIS — I10 Essential (primary) hypertension: Secondary | ICD-10-CM | POA: Insufficient documentation

## 2018-05-23 DIAGNOSIS — Z794 Long term (current) use of insulin: Secondary | ICD-10-CM | POA: Diagnosis not present

## 2018-05-23 DIAGNOSIS — Z87891 Personal history of nicotine dependence: Secondary | ICD-10-CM | POA: Diagnosis not present

## 2018-05-23 DIAGNOSIS — S92252A Displaced fracture of navicular [scaphoid] of left foot, initial encounter for closed fracture: Secondary | ICD-10-CM | POA: Diagnosis not present

## 2018-05-23 DIAGNOSIS — M7989 Other specified soft tissue disorders: Secondary | ICD-10-CM

## 2018-05-23 DIAGNOSIS — Y939 Activity, unspecified: Secondary | ICD-10-CM | POA: Insufficient documentation

## 2018-05-23 DIAGNOSIS — X58XXXA Exposure to other specified factors, initial encounter: Secondary | ICD-10-CM | POA: Diagnosis not present

## 2018-05-23 DIAGNOSIS — N179 Acute kidney failure, unspecified: Secondary | ICD-10-CM

## 2018-05-23 LAB — CBC WITH DIFFERENTIAL/PLATELET
Abs Immature Granulocytes: 0 10*3/uL (ref 0.0–0.1)
Basophils Absolute: 0 10*3/uL (ref 0.0–0.1)
Basophils Relative: 1 %
Eosinophils Absolute: 0.2 10*3/uL (ref 0.0–0.7)
Eosinophils Relative: 3 %
HCT: 44.8 % (ref 39.0–52.0)
Hemoglobin: 14.7 g/dL (ref 13.0–17.0)
Immature Granulocytes: 0 %
Lymphocytes Relative: 21 %
Lymphs Abs: 1.1 10*3/uL (ref 0.7–4.0)
MCH: 28.9 pg (ref 26.0–34.0)
MCHC: 32.8 g/dL (ref 30.0–36.0)
MCV: 88 fL (ref 78.0–100.0)
Monocytes Absolute: 0.5 10*3/uL (ref 0.1–1.0)
Monocytes Relative: 10 %
Neutro Abs: 3.5 10*3/uL (ref 1.7–7.7)
Neutrophils Relative %: 65 %
Platelets: 261 10*3/uL (ref 150–400)
RBC: 5.09 MIL/uL (ref 4.22–5.81)
RDW: 12 % (ref 11.5–15.5)
WBC: 5.4 10*3/uL (ref 4.0–10.5)

## 2018-05-23 LAB — COMPREHENSIVE METABOLIC PANEL
ALT: 27 U/L (ref 0–44)
AST: 20 U/L (ref 15–41)
Albumin: 3.2 g/dL — ABNORMAL LOW (ref 3.5–5.0)
Alkaline Phosphatase: 136 U/L — ABNORMAL HIGH (ref 38–126)
Anion gap: 10 (ref 5–15)
BUN: 19 mg/dL (ref 6–20)
CO2: 24 mmol/L (ref 22–32)
Calcium: 8.9 mg/dL (ref 8.9–10.3)
Chloride: 101 mmol/L (ref 98–111)
Creatinine, Ser: 1.85 mg/dL — ABNORMAL HIGH (ref 0.61–1.24)
GFR calc Af Amer: 51 mL/min — ABNORMAL LOW (ref >60)
GFR calc non Af Amer: 44 mL/min — ABNORMAL LOW (ref >60)
Glucose, Bld: 218 mg/dL — ABNORMAL HIGH (ref 70–99)
Potassium: 4.1 mmol/L (ref 3.5–5.1)
Sodium: 135 mmol/L (ref 135–145)
Total Bilirubin: 0.8 mg/dL (ref 0.3–1.2)
Total Protein: 6.6 g/dL (ref 6.5–8.1)

## 2018-05-23 LAB — SEDIMENTATION RATE: Sed Rate: 48 mm/hr — ABNORMAL HIGH (ref 0–16)

## 2018-05-23 LAB — URIC ACID: Uric Acid, Serum: 7.2 mg/dL (ref 3.7–8.6)

## 2018-05-23 LAB — BRAIN NATRIURETIC PEPTIDE: B Natriuretic Peptide: 19.7 pg/mL (ref 0.0–100.0)

## 2018-05-23 LAB — I-STAT CG4 LACTIC ACID, ED: Lactic Acid, Venous: 1.44 mmol/L (ref 0.5–1.9)

## 2018-05-23 LAB — C-REACTIVE PROTEIN: CRP: 3.7 mg/dL — ABNORMAL HIGH (ref <1.0)

## 2018-05-23 MED ORDER — OXYCODONE-ACETAMINOPHEN 5-325 MG PO TABS: 1.0000 | ORAL_TABLET | Freq: Four times a day (QID) | ORAL | 0 refills | Status: DC | PRN

## 2018-05-23 MED ORDER — HYDROCODONE-ACETAMINOPHEN 5-325 MG PO TABS
1.0000 | ORAL_TABLET | Freq: Once | ORAL | Status: AC
  Administered 2018-05-23: 1 via ORAL
  Filled 2018-05-23: qty 1

## 2018-05-23 MED ORDER — MORPHINE SULFATE (PF) 4 MG/ML IV SOLN
4.0000 mg | Freq: Once | INTRAVENOUS | Status: AC
  Administered 2018-05-23: 4 mg via INTRAMUSCULAR
  Filled 2018-05-23: qty 1

## 2018-05-23 MED ORDER — OXYCODONE-ACETAMINOPHEN 5-325 MG PO TABS
1.0000 | ORAL_TABLET | Freq: Once | ORAL | Status: AC
  Administered 2018-05-24: 1 via ORAL
  Filled 2018-05-23: qty 1

## 2018-05-23 NOTE — ED Notes (Signed)
ED Provider at bedside. 

## 2018-05-23 NOTE — ED Provider Notes (Signed)
[facility] EMERGENCY DEPARTMENT Provider Note   CSN: [id number] Arrival date & time: 05/23/18  1452     History   Chief Complaint Chief Complaint  Patient presents with  . Foot Swelling    HPI Dwayne Rocha is a 42 y.o. male.  HPI Patient recently treated for left lower extremity cellulitis.  He completed a course of clindamycin.  States the swelling and pain to his and leg had significantly improved.  Went back to work today standing for 6 hours.  Started having swelling to the ankle and foot as well as pain especially at the base of the foot.  No fever or chills.  Denies shortness of breath or chest pain. Past Medical History:  Diagnosis Date  . Anxiety   . Depression   . Diabetes mellitus   . Hypertension     Patient Active Problem List   Diagnosis Date Noted  . Severe episode of recurrent major depressive disorder, without psychotic features (HCC)   . MDD (major depressive disorder) 06/10/2016  . DM (diabetes mellitus), type 2, uncontrolled (HCC) 10/02/2015  . Facial cellulitis 09/30/2015  . Anxiety 09/30/2015  . HTN (hypertension) 09/30/2015    Past Surgical History:  Procedure Laterality Date  . HIP SURGERY     "pins in hips"-"growth plate slipped"  . WRIST SURGERY          Home Medications    Prior to Admission medications   Medication Sig Start Date End Date Taking? Authorizing Provider  Armodafinil (NUVIGIL) 250 MG tablet Take 250 mg by mouth daily.   Yes [provider]  gabapentin (NEURONTIN) 100 MG capsule Take 100 mg by mouth 3 (three) times daily.   Yes [provider]  Insulin Glargine (BASAGLAR KWIKPEN) 100 UNIT/ML SOPN Inject 30 Units into the skin at bedtime.   Yes [provider]  metFORMIN (GLUCOPHAGE) 1000 MG tablet Take 1 tablet (1,000 mg total) by mouth 2 (two) times daily with a meal. 06/17/16  Yes Oneta Rack, NP  ondansetron (ZOFRAN ODT) 4 MG disintegrating tablet Take 1 tablet (4 mg  total) by mouth every 8 (eight) hours as needed for nausea or vomiting. 12/13/17  Yes Street, [place], PA-C  ranitidine (ZANTAC) 150 MG tablet Take 1 tablet (150 mg total) by mouth 2 (two) times daily. Patient taking differently: Take 150 mg by mouth 2 (two) times daily as needed for heartburn.  12/13/17  Yes Street, [place], PA-C  sertraline (ZOLOFT) 100 MG tablet Take 1 tablet (100 mg total) by mouth daily. 06/17/16  Yes Oneta Rack, NP  clindamycin (CLEOCIN) 150 MG capsule Take 3 capsules (450 mg total) by mouth 3 (three) times daily. Patient not taking: Reported on 05/23/2018 05/17/18   Bethel Born, PA-C  insulin glargine (LANTUS) 100 UNIT/ML injection Inject 0.08 mLs (8 Units total) into the skin at bedtime. Patient not taking: Reported on 05/23/2018 06/17/16   Oneta Rack, NP    Family History History reviewed. No pertinent family history.  Social History Social History   Tobacco Use  . Smoking status: Former Smoker    Packs/day: 0.50    Years: 12.00    Pack years: 6.00    Types: Cigarettes  . Smokeless tobacco: Current User  Substance Use Topics  . Alcohol use: Yes    Comment: about 6x a year  . Drug use: No     Allergies   Patient has no known allergies.   Review of Systems Review of Systems  Constitutional: Negative for chills and fever.  Respiratory: Negative for cough and shortness of breath.   Cardiovascular: Positive for leg swelling. Negative for chest pain.  Gastrointestinal: Negative for abdominal pain, constipation, diarrhea, nausea and vomiting.  Musculoskeletal: Negative for back pain, neck pain and neck stiffness.  Skin: Negative for rash and wound.  Neurological: Negative for dizziness, weakness, light-headedness, numbness and headaches.  All other systems reviewed and are negative.    Physical Exam Updated Vital Signs BP 91/62 (BP Location: Right Arm)   Pulse (!) 102   Temp 98.7 F (37.1 C) (Oral)   Resp 16   SpO2 99%   Physical Exam    Constitutional: He is oriented to person, place, and time. He appears well-developed and well-nourished. No distress.  HENT:  Head: Normocephalic and atraumatic.  Mouth/Throat: Oropharynx is clear and moist. No oropharyngeal exudate.  Eyes: Pupils are equal, round, and reactive to light. EOM are normal.  Neck: Normal range of motion. Neck supple. No JVD present.  Cardiovascular: Normal rate and regular rhythm.  Pulmonary/Chest: Effort normal and breath sounds normal. No stridor. No respiratory distress. He has no wheezes. He has no rales. He exhibits no tenderness.  Abdominal: Soft. Bowel sounds are normal. There is no tenderness. There is no rebound and no guarding.  Musculoskeletal: Normal range of motion. He exhibits edema and tenderness.  Patient with 1+ pitting edema to the left ankle and foot.  Patient has tenderness along the plantar surface of the foot especially close to the calcaneus.  No obvious erythema.  Question of a mild warmth compared to the right foot.  Distal pulses are 2+.  Neurological: He is alert and oriented to person, place, and time.  Moves all extremities without focal deficit.  Sensation intact.  Skin: Skin is warm and dry. Capillary refill takes less than 2 seconds. No rash noted. He is not diaphoretic. No erythema.  Psychiatric: He has a normal mood and affect. His behavior is normal.  Nursing note and vitals reviewed.    ED Treatments / Results  Labs (all labs ordered are listed, but only abnormal results are displayed) Labs Reviewed  CBC WITH DIFFERENTIAL/PLATELET  COMPREHENSIVE METABOLIC PANEL  BRAIN NATRIURETIC PEPTIDE  URIC ACID  SEDIMENTATION RATE  C-REACTIVE PROTEIN  I-STAT CG4 LACTIC ACID, ED    EKG None  Radiology No results found.  Procedures Procedures (including critical care time)  Medications Ordered in ED Medications - No data to display   Initial Impression / Assessment and Plan / ED Course  I have reviewed the triage vital  signs and the nursing notes.  Pertinent labs & imaging results that were available during my care of the patient were reviewed by me and considered in my medical decision making (see chart for details).       Final Clinical Impressions(s) / ED Diagnoses   Final diagnoses:  None    ED Discharge Orders    None       Loren RacerYelverton, Selia Wareing, MD 05/24/18 1818

## 2018-05-23 NOTE — ED Triage Notes (Signed)
Pt seen 05-16-18 for pain/swelling in left foot.  Treated with clindamycin.  Pain/swelling did improve, pt went back to work today and 6 hours into shift, swelling/pain returned.

## 2018-05-23 NOTE — ED Notes (Signed)
Paged otho tec 

## 2018-05-24 MED ORDER — OXYCODONE-ACETAMINOPHEN 5-325 MG PO TABS: 1.0000 | ORAL_TABLET | Freq: Four times a day (QID) | ORAL | 0 refills | Status: DC | PRN

## 2018-05-24 NOTE — Progress Notes (Signed)
Orthopedic Tech Progress Note Patient Details:  Dwayne PihWilliam Rocha 09/23/1976 [id number]  Ortho Devices Type of Ortho Device: Crutches, Postop shoe/boot Ortho Device/Splint Location: lle Ortho Device/Splint Interventions: Ordered, Application, Adjustment   Post Interventions Patient Tolerated: Well Instructions Provided: Care of device, Adjustment of device   [name], [name] 05/24/2018, 1:09 AM

## 2018-05-24 NOTE — ED Notes (Signed)
ED Provider at bedside. 

## 2018-05-24 NOTE — ED Notes (Signed)
Otho tec bedside

## 2018-05-24 NOTE — ED Provider Notes (Signed)
Pain medication prescription was not signed by patient's provider.  Will reprint and sign.   Rise MuLeaphart, Lynasia Meloche T, PA-C 05/24/18 0040    Loren RacerYelverton, David, MD 05/24/18 [phone]

## 2018-06-12 DIAGNOSIS — M14679 Charcot's joint, unspecified ankle and foot: Secondary | ICD-10-CM | POA: Insufficient documentation

## 2020-02-03 ENCOUNTER — Emergency Department (HOSPITAL_COMMUNITY): Payer: 59

## 2020-02-03 ENCOUNTER — Other Ambulatory Visit: Payer: Self-pay

## 2020-02-03 ENCOUNTER — Encounter (HOSPITAL_COMMUNITY): Payer: Self-pay | Admitting: Emergency Medicine

## 2020-02-03 ENCOUNTER — Emergency Department (HOSPITAL_COMMUNITY)
Admission: EM | Admit: 2020-02-03 | Discharge: 2020-02-03 | Disposition: A | Discharge status: Home / Self Care | Payer: 59 | Attending: Emergency Medicine | Admitting: Emergency Medicine

## 2020-02-03 DIAGNOSIS — I10 Essential (primary) hypertension: Secondary | ICD-10-CM | POA: Diagnosis not present

## 2020-02-03 DIAGNOSIS — E119 Type 2 diabetes mellitus without complications: Secondary | ICD-10-CM | POA: Insufficient documentation

## 2020-02-03 DIAGNOSIS — Z79899 Other long term (current) drug therapy: Secondary | ICD-10-CM | POA: Diagnosis not present

## 2020-02-03 DIAGNOSIS — R0789 Other chest pain: Secondary | ICD-10-CM | POA: Insufficient documentation

## 2020-02-03 DIAGNOSIS — Z87891 Personal history of nicotine dependence: Secondary | ICD-10-CM | POA: Insufficient documentation

## 2020-02-03 DIAGNOSIS — R1011 Right upper quadrant pain: Secondary | ICD-10-CM | POA: Insufficient documentation

## 2020-02-03 DIAGNOSIS — Z794 Long term (current) use of insulin: Secondary | ICD-10-CM | POA: Diagnosis not present

## 2020-02-03 LAB — BASIC METABOLIC PANEL
Anion gap: 9 (ref 5–15)
BUN: 27 mg/dL — ABNORMAL HIGH (ref 6–20)
CO2: 24 mmol/L (ref 22–32)
Calcium: 8.9 mg/dL (ref 8.9–10.3)
Chloride: 105 mmol/L (ref 98–111)
Creatinine, Ser: 1.57 mg/dL — ABNORMAL HIGH (ref 0.61–1.24)
GFR calc Af Amer: >60 mL/min (ref >60)
GFR calc non Af Amer: 54 mL/min — ABNORMAL LOW (ref >60)
Glucose, Bld: 137 mg/dL — ABNORMAL HIGH (ref 70–99)
Potassium: 4.3 mmol/L (ref 3.5–5.1)
Sodium: 138 mmol/L (ref 135–145)

## 2020-02-03 LAB — CBC
HCT: 37.4 % — ABNORMAL LOW (ref 39.0–52.0)
Hemoglobin: 12.3 g/dL — ABNORMAL LOW (ref 13.0–17.0)
MCH: 29.6 pg (ref 26.0–34.0)
MCHC: 32.9 g/dL (ref 30.0–36.0)
MCV: 90.1 fL (ref 80.0–100.0)
Platelets: 237 10*3/uL (ref 150–400)
RBC: 4.15 MIL/uL — ABNORMAL LOW (ref 4.22–5.81)
RDW: 13.2 % (ref 11.5–15.5)
WBC: 6.7 10*3/uL (ref 4.0–10.5)
nRBC: 0 % (ref 0.0–0.2)

## 2020-02-03 LAB — HEPATIC FUNCTION PANEL
ALT: 25 U/L (ref 0–44)
AST: 23 U/L (ref 15–41)
Albumin: 3.1 g/dL — ABNORMAL LOW (ref 3.5–5.0)
Alkaline Phosphatase: 102 U/L (ref 38–126)
Bilirubin, Direct: <0.1 mg/dL (ref 0.0–0.2)
Total Bilirubin: 0.6 mg/dL (ref 0.3–1.2)
Total Protein: 6.3 g/dL — ABNORMAL LOW (ref 6.5–8.1)

## 2020-02-03 LAB — TROPONIN I (HIGH SENSITIVITY)
Troponin I (High Sensitivity): 10 ng/L (ref <18)
Troponin I (High Sensitivity): 11 ng/L (ref <18)

## 2020-02-03 LAB — LIPASE, BLOOD: Lipase: 27 U/L (ref 11–51)

## 2020-02-03 MED ORDER — SODIUM CHLORIDE 0.9 % IV BOLUS
500.0000 mL | Freq: Once | INTRAVENOUS | Status: AC
  Administered 2020-02-03: 500 mL via INTRAVENOUS

## 2020-02-03 MED ORDER — SODIUM CHLORIDE 0.9% FLUSH
3.0000 mL | Freq: Once | INTRAVENOUS | Status: AC
  Administered 2020-02-03: 3 mL via INTRAVENOUS

## 2020-02-03 MED ORDER — KETOROLAC TROMETHAMINE 30 MG/ML IJ SOLN
30.0000 mg | Freq: Once | INTRAMUSCULAR | Status: AC
  Administered 2020-02-03: 30 mg via INTRAVENOUS
  Filled 2020-02-03: qty 1

## 2020-02-03 NOTE — ED Provider Notes (Signed)
[facility] EMERGENCY DEPARTMENT Provider Note   CSN: [id number] Arrival date & time: 02/03/20  0035     History Chief Complaint  Patient presents with  . Chest Pain    Dwayne Rocha is a 44 y.o. male.  Pt presents to the ED today with CP.  CP started while at work last night.  He had a sharp pain in his right chest.  He said it has now settled in his right and upper abdomen.  Pt denies any f/c.  No cough.  He was not doing anything strenuous when this happened.        Past Medical History:  Diagnosis Date  . Anxiety   . Depression   . Diabetes mellitus   . Hypertension     Patient Active Problem List   Diagnosis Date Noted  . Severe episode of recurrent major depressive disorder, without psychotic features (HCC)   . MDD (major depressive disorder) 06/10/2016  . DM (diabetes mellitus), type 2, uncontrolled (HCC) 10/02/2015  . Facial cellulitis 09/30/2015  . Anxiety 09/30/2015  . HTN (hypertension) 09/30/2015    Past Surgical History:  Procedure Laterality Date  . HIP SURGERY     "pins in hips"-"growth plate slipped"  . WRIST SURGERY         No family history on file.  Social History   Tobacco Use  . Smoking status: Former Smoker    Packs/day: 0.50    Years: 12.00    Pack years: 6.00    Types: Cigarettes  . Smokeless tobacco: Current User  Substance Use Topics  . Alcohol use: Yes    Comment: Socially   . Drug use: No    Home Medications Prior to Admission medications   Medication Sig Start Date End Date Taking? Authorizing Provider  amLODipine (NORVASC) 10 MG tablet Take 10 mg by mouth daily. 12/09/19  Yes [provider]  furosemide (LASIX) 20 MG tablet Take 20 mg by mouth daily as needed for edema. 01/17/20  Yes [provider]  HUMALOG MIX 75/25 (75-25) 100 UNIT/ML SUSP injection Inject 20 Units into the skin 2 (two) times daily. 01/18/20  Yes [provider]  hydrochlorothiazide (MICROZIDE) 12.5 MG  capsule Take by mouth daily. 12/08/19  Yes [provider]  ibuprofen (ADVIL) 200 MG tablet Take 400 mg by mouth every 6 (six) hours as needed for moderate pain.   Yes [provider]  insulin lispro (HUMALOG KWIKPEN) 100 UNIT/ML KwikPen Inject 2-10 Units into the skin 3 (three) times daily before meals. Per sliding scale not given, using as needed   Yes [provider]  lisinopril (ZESTRIL) 40 MG tablet Take 40 mg by mouth daily. 11/10/19  Yes [provider]  metFORMIN (GLUCOPHAGE) 500 MG tablet Take 500 mg by mouth 2 (two) times daily. 01/10/20  Yes [provider]  naproxen sodium (ALEVE) 220 MG tablet Take 440 mg by mouth daily as needed (pain).   Yes [provider]  pantoprazole (PROTONIX) 40 MG tablet Take 40 mg by mouth 2 (two) times daily. 01/10/20  Yes [provider]  simvastatin (ZOCOR) 10 MG tablet Take 10 mg by mouth at bedtime. 12/10/19  Yes [provider]  clindamycin (CLEOCIN) 150 MG capsule Take 3 capsules (450 mg total) by mouth 3 (three) times daily. Patient not taking: Reported on 05/23/2018 05/17/18   Bethel Born, PA-C  insulin glargine (LANTUS) 100 UNIT/ML injection Inject 0.08 mLs (8 Units total) into the skin at  bedtime. Patient not taking: Reported on 05/23/2018 06/17/16   Derrill Center, NP  ondansetron (ZOFRAN ODT) 4 MG disintegrating tablet Take 1 tablet (4 mg total) by mouth every 8 (eight) hours as needed for nausea or vomiting. Patient not taking: Reported on 02/03/2020 12/13/17   Street, [place], PA-C  oxyCODONE-acetaminophen (PERCOCET) 5-325 MG tablet Take 1-2 tablets by mouth every 6 (six) hours as needed for severe pain. Patient not taking: Reported on 02/03/2020 05/24/18   Ocie Cornfield T, PA-C  ranitidine (ZANTAC) 150 MG tablet Take 1 tablet (150 mg total) by mouth 2 (two) times daily. Patient not taking: Reported on 02/03/2020 12/13/17   Street, [place], PA-C  sertraline (ZOLOFT) 100 MG tablet  Take 1 tablet (100 mg total) by mouth daily. Patient not taking: Reported on 02/03/2020 06/17/16   Derrill Center, NP    Allergies    Lyrica [pregabalin]  Review of Systems   Review of Systems  Cardiovascular: Positive for chest pain.  All other systems reviewed and are negative.   Physical Exam Updated Vital Signs BP (!) 142/80 (BP Location: Left Arm)   Pulse 69   Temp 98.5 F (36.9 C) (Oral)   Resp 12   Ht 5\' 11"  (1.803 m)   Wt (!) 154.2 kg   SpO2 99%   BMI 47.42 kg/m   Physical Exam Vitals and nursing note reviewed.  Constitutional:      Appearance: He is well-developed.  HENT:     Head: Normocephalic and atraumatic.  Eyes:     Extraocular Movements: Extraocular movements intact.     Pupils: Pupils are equal, round, and reactive to light.  Cardiovascular:     Rate and Rhythm: Normal rate and regular rhythm.     Heart sounds: Normal heart sounds.  Pulmonary:     Effort: Pulmonary effort is normal.     Breath sounds: Normal breath sounds.  Abdominal:     General: Bowel sounds are normal.     Palpations: Abdomen is soft.     Tenderness: There is abdominal tenderness in the right upper quadrant and epigastric area.  Musculoskeletal:        General: Normal range of motion.     Cervical back: Normal range of motion and neck supple.  Skin:    General: Skin is warm.     Capillary Refill: Capillary refill takes less than 2 seconds.  Neurological:     General: No focal deficit present.     Mental Status: He is alert and oriented to person, place, and time.  Psychiatric:        Mood and Affect: Mood normal.        Behavior: Behavior normal.     ED Results / Procedures / Treatments   Labs (all labs ordered are listed, but only abnormal results are displayed) Labs Reviewed  BASIC METABOLIC PANEL - Abnormal; Notable for the following components:      Result Value   Glucose, Bld 137 (*)    BUN 27 (*)    Creatinine, Ser 1.57 (*)    GFR calc non Af Amer 54 (*)     All other components within normal limits  CBC - Abnormal; Notable for the following components:   RBC 4.15 (*)    Hemoglobin 12.3 (*)    HCT 37.4 (*)    All other components within normal limits  HEPATIC FUNCTION PANEL - Abnormal; Notable for the following components:   Total Protein 6.3 (*)    Albumin  3.1 (*)    All other components within normal limits  LIPASE, BLOOD  TROPONIN I (HIGH SENSITIVITY)  TROPONIN I (HIGH SENSITIVITY)    EKG EKG Interpretation  Date/Time:  Thursday February 03 2020 00:34:31 EDT Ventricular Rate:  100 PR Interval:  148 QRS Duration: 72 QT Interval:  324 QTC Calculation: 417 R Axis:   39 Text Interpretation: Normal sinus rhythm Cannot rule out Anterior infarct , age undetermined Abnormal ECG Since last tracing rate slower Confirmed by Jacalyn Lefevre [phone]) on 02/03/2020 7:58:46 AM   Radiology DG Chest 2 View  Result Date: 02/03/2020 CLINICAL DATA:  Chest pain EXAM: CHEST - 2 VIEW COMPARISON:  12/13/2017 FINDINGS: The heart size and mediastinal contours are within normal limits. Both lungs are clear. The visualized skeletal structures are unremarkable. IMPRESSION: Normal study. Electronically Signed   By: Charlett Nose M.D.   On: 02/03/2020 00:58   US Abdomen Limited RUQ  Result Date: 02/03/2020 CLINICAL DATA:  Chest pain, no nausea or vomiting. EXAM: ULTRASOUND ABDOMEN LIMITED RIGHT UPPER QUADRANT COMPARISON:  None. FINDINGS: Gallbladder: No gallstones or wall thickening visualized. No sonographic Murphy sign noted by sonographer. Common bile duct: Diameter: 3.5 mm Liver: No focal liver lesion is identified. There is diffuse increased echotexture of liver. Portal vein is patent on color Doppler imaging with normal direction of blood flow towards the liver. Other: None. IMPRESSION: Normal gallbladder. Diffuse increased echotexture of liver. This can be seen in fatty infiltration of liver. Electronically Signed   By: Sherian Rein M.D.   On: 02/03/2020 10:16      Procedures Procedures (including critical care time)  Medications Ordered in ED Medications  sodium chloride flush (NS) 0.9 % injection 3 mL (3 mLs Intravenous Given 02/03/20 0845)  sodium chloride 0.9 % bolus 500 mL (0 mLs Intravenous Stopped 02/03/20 1046)  ketorolac (TORADOL) 30 MG/ML injection 30 mg (30 mg Intravenous Given 02/03/20 0846)    ED Course  I have reviewed the triage vital signs and the nursing notes.  Pertinent labs & imaging results that were available during my care of the patient were reviewed by me and considered in my medical decision making (see chart for details).    MDM Rules/Calculators/A&P                      Pt is feeling much better.  Pain is RUQ with nl gb US.  I ordered an outpatient HIDA to be done.  Pt to return if worse and f/u with pcp.  Final Clinical Impression(s) / ED Diagnoses Final diagnoses:  RUQ pain  Atypical chest pain    Rx / DC Orders ED Discharge Orders         Ordered    NM Hepato W/EF     02/03/20 1128           Jacalyn Lefevre, MD 02/03/20 1129

## 2020-02-03 NOTE — ED Notes (Signed)
Patient verbalizes understanding of discharge instructions. Opportunity for questioning and answers were provided. Armband removed by staff, pt discharged from ED.  

## 2020-02-03 NOTE — ED Triage Notes (Signed)
Pt reports CP onset earlier this evening while at work. Also reports difficulty taking a deep breath. R sided with radiation into back, tight, 7/10.

## 2020-02-04 ENCOUNTER — Ambulatory Visit: Payer: 59 | Attending: Internal Medicine

## 2020-02-04 DIAGNOSIS — Z23 Encounter for immunization: Secondary | ICD-10-CM

## 2020-02-04 NOTE — Progress Notes (Signed)
   Covid-19 Vaccination Clinic  Name:  Dwayne Rocha    MRN: [id number] DOB: [birth year]  02/04/2020  [name] was observed post Covid-19 immunization for 15 minutes without incident. He was provided with Vaccine Information Sheet and instruction to access the V-Safe system.   [name] was instructed to call [phone] with any severe reactions post vaccine: Marland Kitchen Difficulty breathing  . Swelling of face and throat  . A fast heartbeat  . A bad rash all over body  . Dizziness and weakness   Immunizations Administered    Name Date Dose VIS Date Route   Pfizer COVID-19 Vaccine 02/04/2020  8:20 AM 0.3 mL 10/22/2019 Intramuscular   Manufacturer: [employer], [employer]   Lot: [id number]   NDC: [address]-3

## 2020-02-23 NOTE — Progress Notes (Signed)
Patient referred by [name], MD for chest pain  Subjective:   Dwayne Rocha, male    DOB: 12/25/1975, 44 y.o.   MRN: [id number]   Chief Complaint  Patient presents with  . Chest Pain  . New Patient (Initial Visit)     HPI  44 y.o. African American male with hypertension, type 2 DM, peripheral neuropathy, referred for chest pain.  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  His work includes a lot of walking and climbing, as well as lifting heavy objects.  He has noticed chest pain with physical activity at work, as well as back pain outside of work.  Initially started and sharp, right-sided pain, radiated across his chest, followed by retrosternal central chest tightness.  He does note pain at rest.  He denies any shortness of breath, orthopnea, PND.  He has occasional leg edema after using his pregabalin.  He is compliant with medical therapy.  Hypertension, diabetes, and lipids are well controlled.   Past Medical History:  Diagnosis Date  . Anxiety   . Depression   . Diabetes mellitus   . Hyperlipidemia   . Hypertension      Past Surgical History:  Procedure Laterality Date  . ELBOW SURGERY    . HIP SURGERY     "pins in hips"-"growth plate slipped"  . NECK SURGERY    . WRIST SURGERY       Social History   Tobacco Use  Smoking Status Former Smoker  . Packs/day: 0.50  . Years: 12.00  . Pack years: 6.00  . Types: Cigarettes  Smokeless Tobacco Current User    Social History   Substance and Sexual Activity  Alcohol Use Yes   Comment: Socially      Family History  Problem Relation Age of Onset  . Hypertension Mother   . Diabetes Mother   . Stroke Maternal Grandmother   . Heart attack Maternal Grandfather   . Stroke Paternal Grandmother   . Stroke Paternal Grandfather   . Diabetes Brother   . Hypertension Brother      Current Outpatient Medications on File Prior to Visit  Medication Sig Dispense Refill  .  amLODipine (NORVASC) 10 MG tablet Take 10 mg by mouth daily.    . furosemide (LASIX) 20 MG tablet Take 20 mg by mouth daily as needed for edema.    Marland Kitchen HUMALOG MIX 75/25 (75-25) 100 UNIT/ML SUSP injection Inject 20 Units into the skin 2 (two) times daily.    . hydrochlorothiazide (HYDRODIURIL) 25 MG tablet Take 25 mg by mouth daily.    . insulin lispro (HUMALOG KWIKPEN) 100 UNIT/ML KwikPen Inject 2-10 Units into the skin 3 (three) times daily before meals. Per sliding scale not given, using as needed    . lisinopril (ZESTRIL) 40 MG tablet Take 40 mg by mouth daily.    . metFORMIN (GLUCOPHAGE) 500 MG tablet Take 500 mg by mouth 2 (two) times daily.    . naproxen sodium (ALEVE) 220 MG tablet Take 440 mg by mouth daily as needed (pain).    . ondansetron (ZOFRAN ODT) 4 MG disintegrating tablet Take 1 tablet (4 mg total) by mouth every 8 (eight) hours as needed for nausea or vomiting. 15 tablet 0  . pantoprazole (PROTONIX) 40 MG tablet Take 40 mg by mouth 2 (two) times daily.    . simvastatin (ZOCOR) 10 MG tablet Take 10 mg by mouth at bedtime.    Marland Kitchen ibuprofen (ADVIL) 200 MG  tablet Take 400 mg by mouth every 6 (six) hours as needed for moderate pain.    . ranitidine (ZANTAC) 150 MG tablet Take 1 tablet (150 mg total) by mouth 2 (two) times daily. (Patient not taking: Reported on 02/03/2020) 30 tablet 0   No current facility-administered medications on file prior to visit.    Cardiovascular and other pertinent studies:  EKG 02/24/2020: Sinus rhythm 92 bpm. Normal EKG. Left atrial enalrgement.    Recent labs: 06/03/2019: Glucose 105, BUN/Cr 15/0.86. EGFR 108. Na/K 138/4.2. Rest of the CMP normal H/H 13.3/39.2. MCV 89.7. Platelets 218 HbA1C N/A Chol 139, TG 55, HDL 47, LDL 78 TSH 0.59 normal   Review of Systems  Cardiovascular: Positive for chest pain. Negative for dyspnea on exertion, leg swelling, palpitations and syncope.         Vitals:   02/24/20 0905 02/24/20 0912  BP: (!) 145/89  137/90  Pulse: 95 94  Temp: 97.7 F (36.5 C)   SpO2: 100%      Body mass index is 46.72 kg/m. Filed Weights   02/24/20 0905  Weight: (!) 335 lb (152 kg)     Objective:   Physical Exam  Constitutional: No distress.  Morbid obesity  Neck: No JVD present.  Cardiovascular: Normal rate, regular rhythm, normal heart sounds and intact distal pulses.  No murmur heard. Pulses:      Carotid pulses are on the right side with bruit and on the left side with bruit. Pulmonary/Chest: Effort normal and breath sounds normal. He has no wheezes. He has no rales.  Musculoskeletal:        General: No edema.  Nursing note and vitals reviewed.        Assessment & Recommendations:   44 y.o. African American male with hypertension, type 2 DM, peripheral neuropathy, referred for chest pain.  Chest pain:  Features of both typical and atypical chest pain.  He was restarted associated with his hypertension, type 2 diabetes mellitus.  Recommend exercise nuclear stress test, echocardiogram.He may need 2-day nuclear study due to body habitus.  Recommend aspirin 81 mg daily and as needed sublingual injection for now.  Avoid ibuprofen to reduce bleeding risk.  Continue current hypertension medicine general concern.  Bilateral carotid bruit: R>L.  Although asymptomatic, wants screening for ultrasound.   F/u in 4-6 weeks  Thank you for referring the patient to [place]. Please feel free to contact with any questions.  [name], MD [facility] Pager: [phone] Office: [phone]

## 2020-02-24 ENCOUNTER — Encounter: Payer: Self-pay | Admitting: Cardiology

## 2020-02-24 ENCOUNTER — Ambulatory Visit: Payer: Medicaid Other | Admitting: Cardiology

## 2020-02-24 ENCOUNTER — Other Ambulatory Visit: Payer: Self-pay

## 2020-02-24 VITALS — BP 137/90 | HR 94 | Temp 97.7°F | Ht 71.0 in | Wt 335.0 lb

## 2020-02-24 DIAGNOSIS — R0789 Other chest pain: Secondary | ICD-10-CM | POA: Insufficient documentation

## 2020-02-24 DIAGNOSIS — E1165 Type 2 diabetes mellitus with hyperglycemia: Secondary | ICD-10-CM

## 2020-02-24 DIAGNOSIS — R079 Chest pain, unspecified: Secondary | ICD-10-CM | POA: Insufficient documentation

## 2020-02-24 DIAGNOSIS — R0989 Other specified symptoms and signs involving the circulatory and respiratory systems: Secondary | ICD-10-CM

## 2020-02-24 DIAGNOSIS — I10 Essential (primary) hypertension: Secondary | ICD-10-CM

## 2020-02-24 MED ORDER — ASPIRIN EC 81 MG PO TBEC: 81.0000 mg | DELAYED_RELEASE_TABLET | Freq: Every day | ORAL | 3 refills | Status: DC

## 2020-02-24 MED ORDER — NITROGLYCERIN 0.4 MG SL SUBL: 0.4000 mg | SUBLINGUAL_TABLET | SUBLINGUAL | 3 refills | Status: DC | PRN

## 2020-02-26 ENCOUNTER — Other Ambulatory Visit (HOSPITAL_COMMUNITY): Payer: Self-pay

## 2020-02-29 ENCOUNTER — Ambulatory Visit: Payer: Medicaid Other

## 2020-02-29 ENCOUNTER — Ambulatory Visit: Payer: 59 | Attending: Internal Medicine

## 2020-02-29 DIAGNOSIS — Z23 Encounter for immunization: Secondary | ICD-10-CM

## 2020-02-29 NOTE — Progress Notes (Signed)
   Covid-19 Vaccination Clinic  Name:  Dorman Calderwood    MRN: [id number] DOB: 1976-07-31  02/29/2020  Mr. Bendickson was observed post Covid-19 immunization for 15 minutes without incident. He was provided with Vaccine Information Sheet and instruction to access the V-Safe system.   Mr. Silva was instructed to call 911 with any severe reactions post vaccine: Marland Kitchen Difficulty breathing  . Swelling of face and throat  . A fast heartbeat  . A bad rash all over body  . Dizziness and weakness   Immunizations Administered    Name Date Dose VIS Date Route   Pfizer COVID-19 Vaccine 02/29/2020  8:10 AM 0.3 mL 01/05/2019 Intramuscular   Manufacturer: [employer], [employer]   Lot: [id number]   NDC: [address]-4

## 2020-03-01 ENCOUNTER — Other Ambulatory Visit: Payer: Medicaid Other

## 2020-03-04 ENCOUNTER — Other Ambulatory Visit (HOSPITAL_COMMUNITY)
Admission: RE | Admit: 2020-03-04 | Discharge: 2020-03-04 | Disposition: A | Discharge status: Home / Self Care | Payer: 59 | Source: Ambulatory Visit | Attending: Cardiology | Admitting: Cardiology

## 2020-03-04 DIAGNOSIS — Z20822 Contact with and (suspected) exposure to covid-19: Secondary | ICD-10-CM | POA: Insufficient documentation

## 2020-03-04 DIAGNOSIS — Z01812 Encounter for preprocedural laboratory examination: Secondary | ICD-10-CM | POA: Diagnosis present

## 2020-03-04 LAB — SARS CORONAVIRUS 2 (TAT 6-24 HRS): SARS Coronavirus 2: NEGATIVE

## 2020-03-06 ENCOUNTER — Other Ambulatory Visit: Payer: 59

## 2020-03-14 ENCOUNTER — Other Ambulatory Visit: Payer: Medicaid Other

## 2020-03-15 ENCOUNTER — Ambulatory Visit: Payer: 59

## 2020-03-15 ENCOUNTER — Other Ambulatory Visit: Payer: Self-pay

## 2020-03-15 DIAGNOSIS — I10 Essential (primary) hypertension: Secondary | ICD-10-CM

## 2020-03-15 DIAGNOSIS — R0789 Other chest pain: Secondary | ICD-10-CM

## 2020-03-15 DIAGNOSIS — R0989 Other specified symptoms and signs involving the circulatory and respiratory systems: Secondary | ICD-10-CM

## 2020-03-24 ENCOUNTER — Other Ambulatory Visit: Payer: Self-pay

## 2020-03-24 ENCOUNTER — Emergency Department (HOSPITAL_COMMUNITY)
Admission: EM | Admit: 2020-03-24 | Discharge: 2020-03-24 | Disposition: A | Discharge status: Home / Self Care | Payer: 59 | Attending: Emergency Medicine | Admitting: Emergency Medicine

## 2020-03-24 ENCOUNTER — Emergency Department (HOSPITAL_COMMUNITY): Payer: 59

## 2020-03-24 ENCOUNTER — Encounter (HOSPITAL_COMMUNITY): Payer: Self-pay | Admitting: Emergency Medicine

## 2020-03-24 DIAGNOSIS — Z87891 Personal history of nicotine dependence: Secondary | ICD-10-CM | POA: Insufficient documentation

## 2020-03-24 DIAGNOSIS — Z794 Long term (current) use of insulin: Secondary | ICD-10-CM | POA: Insufficient documentation

## 2020-03-24 DIAGNOSIS — Y9259 Other trade areas as the place of occurrence of the external cause: Secondary | ICD-10-CM | POA: Insufficient documentation

## 2020-03-24 DIAGNOSIS — Y9301 Activity, walking, marching and hiking: Secondary | ICD-10-CM | POA: Insufficient documentation

## 2020-03-24 DIAGNOSIS — I10 Essential (primary) hypertension: Secondary | ICD-10-CM | POA: Diagnosis not present

## 2020-03-24 DIAGNOSIS — Y999 Unspecified external cause status: Secondary | ICD-10-CM | POA: Diagnosis not present

## 2020-03-24 DIAGNOSIS — S91109A Unspecified open wound of unspecified toe(s) without damage to nail, initial encounter: Secondary | ICD-10-CM

## 2020-03-24 DIAGNOSIS — E119 Type 2 diabetes mellitus without complications: Secondary | ICD-10-CM | POA: Insufficient documentation

## 2020-03-24 DIAGNOSIS — Z7982 Long term (current) use of aspirin: Secondary | ICD-10-CM | POA: Insufficient documentation

## 2020-03-24 DIAGNOSIS — X503XXA Overexertion from repetitive movements, initial encounter: Secondary | ICD-10-CM | POA: Insufficient documentation

## 2020-03-24 DIAGNOSIS — S91101A Unspecified open wound of right great toe without damage to nail, initial encounter: Secondary | ICD-10-CM | POA: Diagnosis not present

## 2020-03-24 DIAGNOSIS — Z79899 Other long term (current) drug therapy: Secondary | ICD-10-CM | POA: Insufficient documentation

## 2020-03-24 MED ORDER — CLINDAMYCIN HCL 150 MG PO CAPS
300.0000 mg | ORAL_CAPSULE | Freq: Once | ORAL | Status: AC
  Administered 2020-03-24: 300 mg via ORAL
  Filled 2020-03-24: qty 2

## 2020-03-24 MED ORDER — CLINDAMYCIN HCL 150 MG PO CAPS: 150.0000 mg | ORAL_CAPSULE | Freq: Four times a day (QID) | ORAL | 0 refills | Status: DC

## 2020-03-24 MED ORDER — BACITRACIN ZINC 500 UNIT/GM EX OINT
TOPICAL_OINTMENT | Freq: Two times a day (BID) | CUTANEOUS | Status: DC
  Administered 2020-03-24: 1 via TOPICAL
  Filled 2020-03-24: qty 0.9

## 2020-03-24 NOTE — Discharge Instructions (Signed)
You are seen today for toe wound.  Thankfully, your wound does not appear infected and your x-ray showed no signs of infection in the bone either.  We are starting you on antibiotics and you should follow-up with the wound care clinic.  Be sure to keep the area clean and change the bandage daily.  You may apply over-the-counter antibiotic ointment as needed as well.

## 2020-03-24 NOTE — ED Provider Notes (Signed)
[facility] EMERGENCY DEPARTMENT Provider Note   CSN: [id number] Arrival date & time: 03/24/20  4259     History Chief Complaint  Patient presents with  . Toe Injury    Dwayne Rocha is a 44 y.o. male.  Patient is a 44 year old gentleman with history of diabetes, peripheral neuropathy who presents emergency department for right great toe wound.  He reports that he wears steel toe boots at work and does a lot of walking.  Noticed that the bottom of his right toe had a wound on it that he thinks occurs last night.  Denies any fever, chills, drainage.        Past Medical History:  Diagnosis Date  . Anxiety   . Depression   . Diabetes mellitus   . Hyperlipidemia   . Hypertension     Patient Active Problem List   Diagnosis Date Noted  . Atypical chest pain 02/24/2020  . Bilateral carotid bruits 02/24/2020  . Exertional chest pain 02/24/2020  . Severe episode of recurrent major depressive disorder, without psychotic features ([place])   . MDD (major depressive disorder) 06/10/2016  . DM (diabetes mellitus), type 2, uncontrolled ([place]) 10/02/2015  . Facial cellulitis 09/30/2015  . Anxiety 09/30/2015  . HTN (hypertension) 09/30/2015    Past Surgical History:  Procedure Laterality Date  . ELBOW SURGERY    . HIP SURGERY     "pins in hips"-"growth plate slipped"  . NECK SURGERY    . WRIST SURGERY         Family History  Problem Relation Age of Onset  . Hypertension Mother   . Diabetes Mother   . Stroke Maternal Grandmother   . Heart attack Maternal Grandfather   . Stroke Paternal Grandmother   . Stroke Paternal Grandfather   . Diabetes Brother   . Hypertension Brother     Social History   Tobacco Use  . Smoking status: Former Smoker    Packs/day: 0.50    Years: 12.00    Pack years: 6.00    Types: Cigarettes  . Smokeless tobacco: Never Used  Substance Use Topics  . Alcohol use: Yes    Comment: Socially   . Drug use: No    Home  Medications Prior to Admission medications   Medication Sig Start Date End Date Taking? Authorizing Provider  amLODipine (NORVASC) 10 MG tablet Take 10 mg by mouth daily. 12/09/19   [provider]  aspirin EC 81 MG tablet Take 1 tablet (81 mg total) by mouth daily. 02/24/20   Patwardhan, Reynold Bowen, MD  clindamycin (CLEOCIN) 150 MG capsule Take 1 capsule (150 mg total) by mouth every 6 (six) hours. 03/24/20   Alveria Apley, PA-C  furosemide (LASIX) 20 MG tablet Take 20 mg by mouth daily as needed for edema. 01/17/20   [provider]  HUMALOG MIX 75/25 (75-25) 100 UNIT/ML SUSP injection Inject 20 Units into the skin 2 (two) times daily. 01/18/20   [provider]  hydrochlorothiazide (HYDRODIURIL) 25 MG tablet Take 25 mg by mouth daily. 02/04/20   [provider]  insulin lispro (HUMALOG KWIKPEN) 100 UNIT/ML KwikPen Inject 2-10 Units into the skin 3 (three) times daily before meals. Per sliding scale not given, using as needed    [provider]  lisinopril (ZESTRIL) 40 MG tablet Take 40 mg by mouth daily. 11/10/19   [provider]  metFORMIN (GLUCOPHAGE) 500 MG tablet Take 500 mg by mouth 2 (two) times daily. 01/10/20  [provider]  nitroGLYCERIN (NITROSTAT) 0.4 MG SL tablet Place 1 tablet (0.4 mg total) under the tongue every 5 (five) minutes as needed for chest pain. 02/24/20 05/24/20  Patwardhan, Anabel Bene, MD  ondansetron (ZOFRAN ODT) 4 MG disintegrating tablet Take 1 tablet (4 mg total) by mouth every 8 (eight) hours as needed for nausea or vomiting. 12/13/17   Street, Mercedes, PA-C  pantoprazole (PROTONIX) 40 MG tablet Take 40 mg by mouth 2 (two) times daily. 01/10/20   [provider]  ranitidine (ZANTAC) 150 MG tablet Take 1 tablet (150 mg total) by mouth 2 (two) times daily. Patient not taking: Reported on 02/03/2020 12/13/17   Street, [place], PA-C  simvastatin (ZOCOR) 10 MG tablet Take 10 mg by mouth at bedtime. 12/10/19    [provider]    Allergies    Lyrica [pregabalin]  Review of Systems   Review of Systems  Constitutional: Negative for chills and fever.  Musculoskeletal: Negative for arthralgias, gait problem and joint swelling.  Skin: Positive for wound.  Allergic/Immunologic: Positive for immunocompromised state.  Neurological: Positive for numbness.    Physical Exam Updated Vital Signs BP 139/86   Pulse 87   Temp 98.3 F (36.8 C) (Oral)   Resp 16   Ht 5\' 11"  (1.803 m)   Wt (!) 152 kg   SpO2 100%   BMI 46.72 kg/m   Physical Exam Vitals and nursing note reviewed.  Constitutional:      General: He is not in acute distress.    Appearance: Normal appearance. He is not ill-appearing, toxic-appearing or diaphoretic.  HENT:     Head: Normocephalic.  Eyes:     Conjunctiva/sclera: Conjunctivae normal.  Pulmonary:     Effort: Pulmonary effort is normal.  Skin:    General: Skin is dry.     Comments: Plantar surface of the entire right great toe with dermis that has been sloughed off. Appeared to be a blister where the skin was ripped off. No surrounding erythema or drainage. No TTP  Neurological:     Mental Status: He is alert.  Psychiatric:        Mood and Affect: Mood normal.     ED Results / Procedures / Treatments   Labs (all labs ordered are listed, but only abnormal results are displayed) Labs Reviewed - No data to display  EKG None  Radiology DG Toe Great Right  Result Date: 03/24/2020 CLINICAL DATA:  Right first toe plantar surface wound. Diabetes mellitus. EXAM: RIGHT GREAT TOE COMPARISON:  None. FINDINGS: Soft tissue irregularity in the plantar right first toe with no radiopaque foreign bodies. No fracture or dislocation. No osseous erosions or periosteal reaction. No definite soft tissue gas. Vascular calcifications throughout the soft tissues. IMPRESSION: Soft tissue irregularity in the plantar right first toe with no radiopaque foreign bodies and no  specific radiographic findings of acute osteomyelitis. Electronically Signed   By: 03/26/2020 M.D.   On: 03/24/2020 10:33    Procedures Procedures (including critical care time)  Medications Ordered in ED Medications  bacitracin ointment (1 application Topical Given 03/24/20 1255)  clindamycin (CLEOCIN) capsule 300 mg (300 mg Oral Given 03/24/20 1254)    ED Course  I have reviewed the triage vital signs and the nursing notes.  Pertinent labs & imaging results that were available during my care of the patient were reviewed by me and considered in my medical decision making (see chart for details).  Clinical Course as of Mar 24 1342  Fri Mar 24, 2020  1212 Diabetic right great toe wound with dermis exposed. Does not actually appear infected and xray was reassuring. Wound cleaned and dressing applied. Started on clindamycin and referred to wound specialist.    [KM]    Clinical Course User Index [KM] Jeral Pinch   MDM Rules/Calculators/A&P                      Based on review of vitals, medical screening exam, lab work and/or imaging, there does not appear to be an acute, emergent etiology for the patient's symptoms. Counseled pt on good return precautions and encouraged both PCP and ED follow-up as needed.  Prior to discharge, I also discussed incidental imaging findings with patient in detail and advised appropriate, recommended follow-up in detail.  Clinical Impression: 1. Open wound of toe, initial encounter     Disposition: Discharge  Prior to providing a prescription for a controlled substance, I independently reviewed the patient's recent prescription history on the [place] Controlled Substance Reporting System. The patient had no recent or regular prescriptions and was deemed appropriate for a brief, less than 3 day prescription of narcotic for acute analgesia.  This note was prepared with assistance of Conservation officer, historic buildings. Occasional  wrong-word or sound-a-like substitutions may have occurred due to the inherent limitations of voice recognition software.  Final Clinical Impression(s) / ED Diagnoses Final diagnoses:  Open wound of toe, initial encounter    Rx / DC Orders ED Discharge Orders         Ordered    clindamycin (CLEOCIN) 150 MG capsule  Every 6 hours     03/24/20 1300           Jeral Pinch 03/24/20 1343    Gwyneth Sprout, MD 04/19/20 2111

## 2020-03-24 NOTE — ED Triage Notes (Signed)
Patient states yesterday he felt his right big toe split open. Unsure of any injury due to neuropathy. Patient is type 2 diabetic current CBG 108. No bleeding or drainage coming from site in triage.

## 2020-03-29 ENCOUNTER — Telehealth: Payer: Medicaid Other | Admitting: Cardiology

## 2020-04-03 ENCOUNTER — Ambulatory Visit: Payer: 59

## 2020-04-03 ENCOUNTER — Other Ambulatory Visit: Payer: Self-pay

## 2020-04-03 DIAGNOSIS — R0789 Other chest pain: Secondary | ICD-10-CM

## 2020-04-07 ENCOUNTER — Encounter (HOSPITAL_BASED_OUTPATIENT_CLINIC_OR_DEPARTMENT_OTHER): Payer: 59 | Attending: Internal Medicine | Admitting: Internal Medicine

## 2020-04-07 DIAGNOSIS — E11621 Type 2 diabetes mellitus with foot ulcer: Secondary | ICD-10-CM | POA: Insufficient documentation

## 2020-04-07 DIAGNOSIS — Z87891 Personal history of nicotine dependence: Secondary | ICD-10-CM | POA: Diagnosis not present

## 2020-04-07 DIAGNOSIS — E1140 Type 2 diabetes mellitus with diabetic neuropathy, unspecified: Secondary | ICD-10-CM | POA: Insufficient documentation

## 2020-04-07 DIAGNOSIS — L97509 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: Secondary | ICD-10-CM | POA: Insufficient documentation

## 2020-04-12 ENCOUNTER — Encounter (HOSPITAL_BASED_OUTPATIENT_CLINIC_OR_DEPARTMENT_OTHER): Payer: 59 | Attending: Physician Assistant | Admitting: Physician Assistant

## 2020-04-12 ENCOUNTER — Telehealth: Payer: Medicaid Other | Admitting: Cardiology

## 2020-04-12 ENCOUNTER — Other Ambulatory Visit (HOSPITAL_COMMUNITY): Payer: Self-pay | Admitting: Physician Assistant

## 2020-04-12 DIAGNOSIS — M869 Osteomyelitis, unspecified: Secondary | ICD-10-CM | POA: Insufficient documentation

## 2020-04-12 DIAGNOSIS — E1169 Type 2 diabetes mellitus with other specified complication: Secondary | ICD-10-CM | POA: Insufficient documentation

## 2020-04-12 DIAGNOSIS — E1140 Type 2 diabetes mellitus with diabetic neuropathy, unspecified: Secondary | ICD-10-CM | POA: Insufficient documentation

## 2020-04-12 DIAGNOSIS — E11621 Type 2 diabetes mellitus with foot ulcer: Secondary | ICD-10-CM | POA: Insufficient documentation

## 2020-04-12 DIAGNOSIS — L97509 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: Secondary | ICD-10-CM | POA: Diagnosis not present

## 2020-04-12 DIAGNOSIS — L97519 Non-pressure chronic ulcer of other part of right foot with unspecified severity: Secondary | ICD-10-CM

## 2020-04-12 NOTE — Progress Notes (Signed)
KATE, SWEETMAN ([id number]) Visit Report for 04/12/2020 Arrival Information Details Patient Name: Date of Service: [place] Birdie Riddle 04/12/2020 3:00 PM Medical Record Number: [id number] Patient Account Number: [id number] Date of Birth/Sex: Treating RN: 1976/08/25 (44 y.o. Hessie Diener Primary Care Nesbit Michon: [name] Other Clinician: Referring Thersia Petraglia: Treating Ruhee Enck/Extender: [name] in Treatment: 0 Visit Information History Since Last Visit Added or deleted any medications: No Patient Arrived: Ambulatory Any new allergies or adverse reactions: No Arrival Time: 15:05 Had a fall or experienced change in No Accompanied By: self activities of daily living that may affect Transfer Assistance: None risk of falls: Patient Identification Verified: Yes Signs or symptoms of abuse/neglect since last visito No Secondary Verification Process Completed: Yes Hospitalized since last visit: No Patient Requires Transmission-Based Precautions: No Has Dressing in Place as Prescribed: Yes Patient Has Alerts: No Pain Present Now: Yes Electronic Signature(s) Signed: 04/12/2020 5:37:32 PM By: Deon Pilling Entered By: Deon Pilling on 04/12/2020 15:08:43 -------------------------------------------------------------------------------- Encounter Discharge Information Details Patient Name: Date of Service: Florestine Avers M 04/12/2020 3:00 PM Medical Record Number: [id number] Patient Account Number: [id number] Date of Birth/Sex: Treating RN: 1976-01-26 (44 y.o. Oval Linsey Primary Care Biance Moncrief: [name] Other Clinician: Referring Shelah Heatley: Treating Fischer Halley/Extender: [name] in Treatment: 0 Encounter Discharge Information Items Post Procedure Vitals Discharge Condition: Stable Temperature (F): 98.3 Ambulatory Status: Ambulatory Pulse (bpm): 90 Discharge Destination: Home Respiratory Rate (breaths/min):  20 Transportation: Private Auto Blood Pressure (mmHg): 136/82 Accompanied By: self Schedule Follow-up Appointment: Yes Clinical Summary of Care: Patient Declined Electronic Signature(s) Signed: 04/12/2020 4:36:43 PM By: [name] RN Entered By: [name] on 04/12/2020 15:56:35 -------------------------------------------------------------------------------- Lower Extremity Assessment Details Patient Name: Date of ServiceFabio Pierce 04/12/2020 3:00 PM Medical Record Number: [id number] Patient Account Number: [id number] Date of Birth/Sex: Treating RN: [birth year] (44 y.o. Hessie Diener Primary Care Evanell Redlich: [name] Other Clinician: Referring Codey Burling: Treating Brodi Kari/Extender: [name] in Treatment: 0 Edema Assessment Assessed: Shirlyn Goltz: No] Patrice Paradise: Yes] E[Left: dema] [Right: :] Calf Left: Right: Point of Measurement: 47 cm From Medial Instep cm 44 cm Ankle Left: Right: Point of Measurement: 12 cm From Medial Instep cm 26 cm Vascular Assessment Pulses: Dorsalis Pedis Palpable: [Right:Yes] Electronic Signature(s) Signed: 04/12/2020 5:37:32 PM By: Deon Pilling Entered By: Deon Pilling on 04/12/2020 15:11:06 -------------------------------------------------------------------------------- Multi-Disciplinary Care Plan Details Patient Name: Date of Service: Jamelle Haring, [facility] M 04/12/2020 3:00 PM Medical Record Number: [id number] Patient Account Number: [id number] Date of Birth/Sex: Treating RN: 1976/07/08 (44 y.o. [name] Primary Care Kyian Obst: [name] Other Clinician: Referring Aleila Syverson: Treating Lyndsey Demos/Extender: [name] in Treatment: 0 Active Inactive Nutrition Nursing Diagnoses: Impaired glucose control: actual or potential Goals: Patient/caregiver verbalizes understanding of need to maintain therapeutic glucose control per primary care physician Date Initiated:  04/07/2020 Target Resolution Date: 05/05/2020 Goal Status: Active Interventions: Provide education on nutrition Treatment Activities: Education provided on Nutrition : 04/07/2020 Notes: Wound/Skin Impairment Nursing Diagnoses: Impaired tissue integrity Goals: Ulcer/skin breakdown will have a volume reduction of 30% by week 4 Date Initiated: 04/07/2020 Target Resolution Date: 05/05/2020 Goal Status: Active Interventions: Provide education on ulcer and skin care Notes: Electronic Signature(s) Signed: 04/12/2020 5:52:33 PM By: Baruch Gouty RN, BSN Entered By: Baruch Gouty on 04/12/2020 15:14:44 -------------------------------------------------------------------------------- Pain Assessment Details Patient Name: Date of Service: Florestine Avers M 04/12/2020 3:00 PM Medical Record Number: [id number] Patient Account Number: [id number] Date of Birth/Sex: Treating RN:  Aug 19, 1976 (44 y.o. Tammy Sours Primary Care Laila Myhre: Fleet Contras Other Clinician: Referring Tyheem Boughner: Treating Kabria Hetzer/Extender: Gilford Silvius in Treatment: 0 Active Problems Location of Pain Severity and Description of Pain Patient Has Paino Yes Site Locations Pain Location: Pain in Ulcers Rate the pain. Current Pain Level: 8 Worst Pain Level: 10 Least Pain Level: 0 Tolerable Pain Level: 8 Pain Management and Medication Current Pain Management: Medication: No Cold Application: No Rest: No Massage: No Activity: No T.E.N.S.: No Heat Application: No Leg drop or elevation: No Is the Current Pain Management Adequate: Adequate How does your wound impact your activities of daily livingo Sleep: No Bathing: No Appetite: No Relationship With Others: No Bladder Continence: No Emotions: No Bowel Continence: No Work: No Toileting: No Drive: No Dressing: No Hobbies: No Electronic Signature(s) Signed: 04/12/2020 5:37:32 PM By: Shawn Stall Entered By: Shawn Stall on  04/12/2020 15:10:35 -------------------------------------------------------------------------------- Patient/Caregiver Education Details Patient Name: Date of Service: [name] 6/2/2021andnbsp3:00 PM Medical Record Number: [id number] Patient Account Number: [id number] Date of Birth/Gender: Treating RN: 1976-06-26 (44 y.o. Damaris Schooner Primary Care Physician: Fleet Contras Other Clinician: Referring Physician: Treating Physician/Extender: Gilford Silvius in Treatment: 0 Education Assessment Education Provided To: Patient Education Topics Provided Offloading: Methods: Explain/Verbal Responses: Reinforcements needed, State content correctly Wound/Skin Impairment: Methods: Explain/Verbal Responses: Reinforcements needed, State content correctly Electronic Signature(s) Signed: 04/12/2020 5:52:33 PM By: Zenaida Deed RN, BSN Entered By: Zenaida Deed on 04/12/2020 15:32:20 -------------------------------------------------------------------------------- Wound Assessment Details Patient Name: Date of Service: [name] 04/12/2020 3:00 PM Medical Record Number: [id number] Patient Account Number: [id number] Date of Birth/Sex: Treating RN: Nov 01, 1976 (44 y.o. Tammy Sours Primary Care Aryonna Gunnerson: Fleet Contras Other Clinician: Referring Tatyanna Cronk: Treating Anna Beaird/Extender: Gilford Silvius in Treatment: 0 Wound Status Wound Number: 1 Primary Etiology: Diabetic Wound/Ulcer of the Lower Extremity Wound Location: Right T Great oe Wound Status: Open Wounding Event: Gradually Appeared Comorbid History: Hypertension, Type II Diabetes Date Acquired: 03/24/2020 Weeks Of Treatment: 0 Clustered Wound: No Wound Measurements Length: (cm) 2.2 Width: (cm) 2.2 Depth: (cm) 0.1 Area: (cm) 3.801 Volume: (cm) 0.38 % Reduction in Area: 8.3% % Reduction in Volume: 8.4% Epithelialization: None Tunneling: No Undermining:  No Wound Description Classification: Grade 2 Wound Margin: Distinct, outline attached Exudate Amount: Medium Exudate Type: Serosanguineous Exudate Color: red, brown Foul Odor After Cleansing: No Slough/Fibrino Yes Wound Bed Granulation Amount: Small (1-33%) Exposed Structure Granulation Quality: Red, Hyper-granulation Fascia Exposed: No Necrotic Amount: Large (67-100%) Fat Layer (Subcutaneous Tissue) Exposed: Yes Necrotic Quality: Adherent Slough Tendon Exposed: No Muscle Exposed: No Joint Exposed: No Bone Exposed: No Treatment Notes Wound #1 (Right Toe Great) 1. Cleanse With Wound Cleanser 3. Primary Dressing Applied Santyl 4. Secondary Dressing Dry Gauze 5. Secured With Secretary/administrator) Signed: 04/12/2020 5:37:32 PM By: Shawn Stall Entered By: Shawn Stall on 04/12/2020 15:11:32 -------------------------------------------------------------------------------- Wound Assessment Details Patient Name: Date of ServiceLyman Bishop 04/12/2020 3:00 PM Medical Record Number: [id number] Patient Account Number: [id number] Date of Birth/Sex: Treating RN: 04/04/1976 (44 y.o. Tammy Sours Primary Care Amorette Charrette: Fleet Contras Other Clinician: Referring Akai Dollard: Treating Braylin Formby/Extender: Gilford Silvius in Treatment: 0 Wound Status Wound Number: 2 Primary Etiology: Diabetic Wound/Ulcer of the Lower Extremity Wound Location: Right, Distal T Great oe Wound Status: Open Wounding Event: Gradually Appeared Comorbid History: Hypertension, Type II Diabetes Date Acquired: 04/07/2020 Weeks Of Treatment: 0 Clustered Wound: No Wound Measurements Length: (cm) 0.5  Width: (cm) 0.8 Depth: (cm) 0.1 Area: (cm) 0.314 Volume: (cm) 0.031 % Reduction in Area: 0% % Reduction in Volume: 0% Epithelialization: None Tunneling: No Undermining: No Wound Description Classification: Grade 1 Wound Margin: Distinct, outline attached Exudate  Amount: Medium Exudate Type: Serosanguineous Exudate Color: red, brown Foul Odor After Cleansing: No Slough/Fibrino Yes Wound Bed Granulation Amount: Large (67-100%) Exposed Structure Granulation Quality: Pink Fascia Exposed: No Necrotic Amount: Small (1-33%) Fat Layer (Subcutaneous Tissue) Exposed: Yes Necrotic Quality: Adherent Slough Tendon Exposed: No Muscle Exposed: No Joint Exposed: No Bone Exposed: No Electronic Signature(s) Signed: 04/12/2020 5:37:32 PM By: Shawn Stall Entered By: Shawn Stall on 04/12/2020 15:11:58 -------------------------------------------------------------------------------- Vitals Details Patient Name: Date of Service: [place] MPKIN, [facility] M 04/12/2020 3:00 PM Medical Record Number: [id number] Patient Account Number: [id number] Date of Birth/Sex: Treating RN: 02-04-1976 (44 y.o. Tammy Sours Primary Care Sarahi Borland: Fleet Contras Other Clinician: Referring Thedora Rings: Treating Steffon Gladu/Extender: Gilford Silvius in Treatment: 0 Vital Signs Time Taken: 15:05 Temperature (F): 98.3 Height (in): 71 Pulse (bpm): 90 Weight (lbs): 335 Respiratory Rate (breaths/min): 20 Body Mass Index (BMI): 46.7 Blood Pressure (mmHg): 136/82 Capillary Blood Glucose (mg/dl): 78 Reference Range: 80 - 120 mg / dl Electronic Signature(s) Signed: 04/12/2020 5:37:32 PM By: Shawn Stall Entered By: Shawn Stall on 04/12/2020 15:09:49

## 2020-04-12 NOTE — Progress Notes (Addendum)
Dwayne Rocha, Dwayne Rocha ([id number]) Visit Report for 04/12/2020 Chief Complaint Document Details Patient Name: Date of Service: [name] 04/12/2020 3:00 PM Medical Record Number: [id number] Patient Account Number: [id number] Date of Birth/Sex: Treating RN: [birth year] (44 y.o. Damaris Schooner Primary Care Provider: Fleet Contras Other Clinician: Referring Provider: Treating Provider/Extender: Gilford Silvius in Treatment: 0 Information Obtained from: Patient Chief Complaint Referred by PCP for right great toe wound x 2 weeks Electronic Signature(s) Signed: 04/12/2020 2:59:07 PM By: Lenda Kelp Rocha-C Entered By: Lenda Kelp on 04/12/2020 14:59:07 -------------------------------------------------------------------------------- Debridement Details Patient Name: Date of Service: LA MPKIN, [facility] Rocha 04/12/2020 3:00 PM Medical Record Number: [id number] Patient Account Number: [id number] Date of Birth/Sex: Treating RN: June 06, 1976 (44 y.o. Damaris Schooner Primary Care Provider: Fleet Contras Other Clinician: Referring Provider: Treating Provider/Extender: Gilford Silvius in Treatment: 0 Debridement Performed for Assessment: Wound #1 Right T Great oe Performed By: Physician Lenda Kelp, Rocha Debridement Type: Debridement Severity of Tissue Pre Debridement: Fat layer exposed Level of Consciousness (Pre-procedure): Awake and Alert Pre-procedure Verification/Time Out Yes - 15:30 Taken: Start Time: 15:32 Pain Control: Other : benzocaine 20% SPRAY T Area Debrided (L x W): otal 4 (cm) x 5.5 (cm) = 22 (cm) Tissue and other material debrided: Viable, Non-Viable, Slough, Subcutaneous, Skin: Dermis , Skin: Epidermis, Slough Level: Skin/Subcutaneous Tissue Debridement Description: Excisional Instrument: Forceps, Scissors Bleeding: None End Time: 15:40 Procedural Pain: 0 Post Procedural Pain: 0 Response to Treatment: Procedure was  tolerated well Level of Consciousness (Post- Awake and Alert procedure): Post Debridement Measurements of Total Wound Length: (cm) 2.2 Width: (cm) 2.2 Depth: (cm) 0.1 Volume: (cm) 0.38 Character of Wound/Ulcer Post Debridement: Requires Further Debridement Severity of Tissue Post Debridement: Fat layer exposed Post Procedure Diagnosis Same as Pre-procedure Electronic Signature(s) Signed: 04/12/2020 4:50:50 PM By: Lenda Kelp Rocha-C Signed: 04/12/2020 5:52:33 PM By: Zenaida Deed RN, BSN Entered By: Zenaida Deed on 04/12/2020 15:40:58 -------------------------------------------------------------------------------- HPI Details Patient Name: Date of Service: LA MPKIN, [facility] Rocha 04/12/2020 3:00 PM Medical Record Number: [id number] Patient Account Number: [id number] Date of Birth/Sex: Treating RN: 06/04/1976 (44 y.o. Damaris Schooner Primary Care Provider: Fleet Contras Other Clinician: Referring Provider: Treating Provider/Extender: Gilford Silvius in Treatment: 0 History of Present Illness HPI Description: 6 y o male with diabetes and diabetic neuropathy with right great toe blister that ruptured 2 weeks ago, he was in ED and received PO clindamycin and after that started on Keflex by PCP few days back He has no wound care other than bactoban to the area daily He gives no history of wounds He has T2DM and A1c listed as 5.8, he scored 2/10 on monofilament testing, ABI on right is 1.1 04/12/2020 upon evaluation today patient appears to be doing somewhat poorly in regard to his great toe ulcer on the right foot. He does have a blister as well on the lateral portion of his foot on the right. With that being said he has been using Sorbact that does not seem to have been beneficial at all in my opinion. I think he may actually benefit from [facility] I would recommend that and send that into the mail order pharmacy for him today. I also think that even though he had a  negative x-ray in the ER that he probably needs to have an appointment for an MRI to further evaluate for any signs of deeper infection at this point. The patient is in agreement  with that plan as well. Electronic Signature(s) Signed: 04/12/2020 3:47:15 PM By: Worthy Keeler Rocha-C Entered By: Worthy Keeler on 04/12/2020 15:47:15 -------------------------------------------------------------------------------- Physical Exam Details Patient Name: Date of Service: Dwayne Rocha 04/12/2020 3:00 PM Medical Record Number: [id number] Patient Account Number: [id number] Date of Birth/Sex: Treating RN: 11/04/1976 (44 y.o. [name] Primary Care Provider: [name] Other Clinician: Referring Provider: Treating Provider/Extender: [name] in Treatment: 0 Constitutional Obese and well-hydrated in no acute distress. Respiratory normal breathing without difficulty. Psychiatric this patient is able to make decisions and demonstrates good insight into disease process. Alert and Oriented x 3. pleasant and cooperative. Notes Upon inspection patient's wound bed actually showed signs of necrotic tissue on the distal portion of the great toe of the right foot. He also has a blister on the lateral portion of the foot. He does wear steel toed boots for work he works at [employer]. I really think he is going to need to be out of work for this and I am fine writing him the note for this as well today. Fortunately there is no signs of active infection systemically but at the same time I am concerned about local infection. Compared to last week's pictures I do not feel like we have made any progress whatsoever. Electronic Signature(s) Signed: 04/12/2020 3:47:53 PM By: Worthy Keeler Rocha-C Entered By: Worthy Keeler on 04/12/2020 15:47:53 -------------------------------------------------------------------------------- Physician Orders Details Patient Name: Date of  Service: [place] MPKIN, [facility] Rocha 04/12/2020 3:00 PM Medical Record Number: [id number] Patient Account Number: [id number] Date of Birth/Sex: Treating RN: Dec 31, 1975 (44 y.o. 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 Dressing Change Frequency Wound #1 Right T Great oe Change dressing every day. Wound Cleansing Wound #1 Right T Great oe May shower and wash wound with soap and water. - with dressing change Primary Wound Dressing Wound #1 Right T Great oe Santyl Ointment Secondary Dressing Wound #1 Right T Great oe Foam - foam donut Kerlix/Rolled Gauze Dry Gauze Edema Control Avoid standing for long periods of time Elevate legs to the level of the heart or above for 30 minutes daily and/or when sitting, a frequency of: Off-Loading Open toe surgical shoe to: - right foot Other: - minimal weight bearing right foot Additional Orders / Instructions Other: - out of work for 1 month Radiology MRI, right foot with/without contast - diabetic foot ulcer of right great toe CPT - (ICD10 L97.509 - 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) Patient Medications llergies: No Known Allergies A Notifications Medication Indication Start End wound size is 2.2 x 2.2 04/12/2020 Santyl cm DOSE topical 250 unit/gram ointment - ointment topical Apply nickel thick daily to the wound bed and then cover with a dressing as directed in clinic Electronic Signature(s) Signed: 04/12/2020 3:50:46 PM By: Worthy Keeler Rocha-C Entered By: Worthy Keeler on 04/12/2020  15:50:45 Prescription 04/12/2020 -------------------------------------------------------------------------------- Dwayne Rocha Patient Name: Provider: 08/15/1976 [id number] Date of Birth: NPI#Florentina Addison Sex: DEA #: [id number] Phone #: License #: [place] Patient Address: [place]  [address] [place] [address] [place] [address] [phone] Allergies Name Reaction Severity No Known Allergies Provider's Orders MRI, right foot with/without contast - ICD10: L97.509 - diabetic foot ulcer of right great toe CPT Hand Signature: Date(s): Electronic Signature(s) Signed: 04/12/2020 4:50:50 PM By: Lenda KelpStone III, Shaheim Mahar Rocha-C Entered By: Lenda KelpStone III, Drayton Tieu on 04/12/2020 15:50:47 -------------------------------------------------------------------------------- Problem List Details Patient Name: Date of Service: [place] MPKIN, [facility] Rocha 04/12/2020 3:00 PM Medical Record Number: [id number] Patient Account Number: [id number] Date of Birth/Sex: Treating RN: 03/13/1976 (44 y.o. 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 with unspecified 04/07/2020 No Yes severity E11.621 Type 2 diabetes mellitus with foot ulcer 04/07/2020 No Yes Inactive Problems Resolved Problems Electronic Signature(s) Signed: 04/12/2020 2:59:01 PM By: Lenda KelpStone III, Fani Rotondo Rocha-C Entered By: Lenda KelpStone III, Vickey Boak on 04/12/2020 14:59:00 -------------------------------------------------------------------------------- Progress Note Details Patient Name: Date of Service: LA Berline LopesMPKIN, Dwayne Rocha 04/12/2020 3:00 PM Medical Record Number: [id number] Patient Account Number:  [id number] Date of Birth/Sex: Treating RN: 04/16/1976 (44 y.o. Damaris SchoonerM) Boehlein, Linda Primary Care Provider: Fleet ContrasAvbuere, Edwin Other Clinician: Referring Provider: Treating Provider/Extender: Gilford SilviusStone III, Yug Loria [name], Edwin Weeks in Treatment: 0 Subjective Chief Complaint Information obtained from Patient Referred by PCP for right great toe wound x 2 weeks History of Present Illness (HPI) 44 y o male with diabetes and diabetic neuropathy with right great toe blister that ruptured 2 weeks ago, he was in ED and received PO clindamycin and after that started on Keflex by PCP few days back He has no wound care other than bactoban to the area daily He gives no history of wounds He has T2DM and A1c listed as 5.8, he scored 2/10 on monofilament testing, ABI on right is 1.1 04/12/2020 upon evaluation today patient appears to be doing somewhat poorly in regard to his great toe ulcer on the right foot. He does have a blister as well on the lateral portion of his foot on the right. With that being said he has been using Sorbact that does not seem to have been beneficial at all in my opinion. [facility] would recommend that and send that into the mail order pharmacy for him today. I also think that even though he had a negative x-ray in the ER that he probably needs to have an appointment for an MRI to further evaluate for any signs of deeper infection at this point. The patient is in agreement with that plan as well. Objective Constitutional Obese and well-hydrated in no acute distress. Vitals Time Taken: 3:05 PM, Height: 71 in, Weight: 335 lbs, BMI: 46.7, Temperature: 98.3 F, Pulse: 90 bpm, Respiratory Rate: 20 breaths/min, Blood Pressure: 136/82 mmHg, Capillary Blood Glucose: 78 mg/dl. Respiratory normal breathing without difficulty. Psychiatric this patient is able to make decisions and demonstrates good insight into disease process. Alert and Oriented x 3. pleasant and  cooperative. General Notes: Upon inspection patient's wound bed actually showed signs of necrotic tissue on the distal portion of the great toe of the right foot. He also has a blister on the lateral portion of the foot. He does wear steel toed boots for work he works at [employer]. I really think he is going to need to be out of work for  this and I am fine writing him the note for this as well today. Fortunately there is no signs of active infection systemically but at the same time I am concerned about local infection. Compared to last week's pictures I do not feel like we have made any progress whatsoever. Integumentary (Hair, Skin) Wound #1 status is Open. Original cause of wound was Gradually Appeared. The wound is located on the Right T Great. The wound measures 2.2cm length x oe 2.2cm width x 0.1cm depth; 3.801cm^2 area and 0.38cm^3 volume. There is Fat Layer (Subcutaneous Tissue) Exposed exposed. There is no tunneling or undermining noted. There is a medium amount of serosanguineous drainage noted. The wound margin is distinct with the outline attached to the wound base. There is small (1-33%) red, hyper - granulation within the wound bed. There is a large (67-100%) amount of necrotic tissue within the wound bed including Adherent Slough. Wound #2 status is Open. Original cause of wound was Gradually Appeared. The wound is located on the Right,Distal T [place]. The wound measures 0.5cm oe length x 0.8cm width x 0.1cm depth; 0.314cm^2 area and 0.031cm^3 volume. There is Fat Layer (Subcutaneous Tissue) Exposed exposed. There is no tunneling or undermining noted. There is a medium amount of serosanguineous drainage noted. The wound margin is distinct with the outline attached to the wound base. There is large (67-100%) pink granulation within the wound bed. There is a small (1-33%) amount of necrotic tissue within the wound bed including Adherent Slough. 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 There was a Excisional Skin/Subcutaneous Tissue Debridement with a total area of 22 sq cm performed by Lenda Kelp, Rocha. With the following instrument(s): Forceps, and Scissors to remove Viable and Non-Viable tissue/material. Material removed includes Subcutaneous Tissue, Slough, Skin: Dermis, and Skin: Epidermis after achieving pain control using Other (benzocaine 20% SPRAY). No specimens were taken. A time out was conducted at 15:30, prior to the start of the procedure. There was no bleeding. The procedure was tolerated well with a pain level of 0 throughout and a pain level of 0 following the procedure. Post Debridement Measurements: 2.2cm length x 2.2cm width x 0.1cm depth; 0.38cm^3 volume. Character of Wound/Ulcer Post Debridement requires further debridement. Severity of Tissue Post Debridement is: Fat layer exposed. Post procedure Diagnosis Wound #1: Same as Pre-Procedure Plan Follow-up Appointments: Return Appointment in 1 week. Dressing Change Frequency: Wound #1 Right T Great: oe Change dressing every day. Wound Cleansing: Wound #1 Right T Great: oe May shower and wash wound with soap and water. - with dressing change Primary Wound Dressing: Wound #1 Right T Great: oe Santyl Ointment Secondary Dressing: Wound #1 Right T Great: oe Foam - foam donut Kerlix/Rolled Gauze Dry Gauze Edema Control: Avoid standing for long periods of time Elevate legs to the level of the heart or above for 30 minutes daily and/or when sitting, a frequency of: Off-Loading: Open toe surgical shoe to: - right foot Other: - minimal weight bearing right  foot Additional Orders / Instructions: Other: - out of work for 1 month Radiology ordered were: MRI, right foot with/without contast - diabetic foot ulcer of right great toe CPT The following medication(s) was prescribed: Santyl topical 250  unit/gram ointment ointment topical Apply nickel thick daily to the wound bed and then cover with a dressing as directed in clinic for wound size is 2.2 x 2.2 cm starting 04/12/2020 1 my suggestion currently is good to be able to go ahead and initiate treatment with Santyl and I am to send that into the pharmacy which is the mail order pharmacy in [facility]. They should be able to help with getting him the best price for the medication. 2. I am also can recommend at this time that we going continue with appropriate offloading he really needs to be at work in my opinion he has to wear steel toe boots for work he really should not be wearing those he should be wearing an offloading shoe in my opinion. For that reason I am going to take him out of work for the next month in order to try to get things under control here. Subsequently were also going to see about ordering an MRI of the right foot since his had an x-ray that appeared to be okay nonetheless I still feel like there is something that does not quite appear to be right at this point. 3. With regard to walking I really want him to be minimally walking only for the essential things at home he should not be doing anything exorbitant at this point. We will see patient back for reevaluation in 1 week here in the clinic. If anything worsens or changes patient will contact our office for additional recommendations. Electronic Signature(s) Signed: 04/12/2020 3:52:23 PM By: Lenda Kelp Rocha-C Entered By: Lenda Kelp on 04/12/2020 15:52:22 -------------------------------------------------------------------------------- SuperBill Details Patient Name: Date of Service: LA MPKIN, Marijean Niemann  04/12/2020 Medical Record Number: [id number] Patient Account Number: [id number] Date of Birth/Sex: Treating RN: 24-Oct-1976 (44 y.o. 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 with unspecified Modifier: severity Quantity: 1 CPT4 Code: [id number] Description: 11045 - DEB SUBQ TISS EA ADDL 20CM ICD-10 Diagnosis Description 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 with unspecified Modifier: severity Quantity: 1 Physician Procedures : CPT4 Code Description Modifier [id number] 99214 - WC PHYS LEVEL 4 - EST PT 25 ICD-10 Diagnosis Description E11.621 Type 2 diabetes mellitus with foot ulcer 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

## 2020-04-12 NOTE — Progress Notes (Signed)
Dwayne Rocha ([id number]) Visit Report for 04/07/2020 Abuse/Suicide Risk Screen Details Patient Name: Date of Service: Dwayne Rocha 04/07/2020 1:15 PM Medical Record Number: [id number] Patient Account Number: [id number] Date of Birth/Sex: Treating RN: 1976-08-11 (44 y.o. Dwayne Rocha) Dwayne Rocha Primary Care Dwayne Rocha: Dwayne Rocha Other Clinician: Referring Dwayne Rocha: Treating Dwayne Rocha/Extender: Dwayne Rocha in Treatment: 0 Abuse/Suicide Risk Screen Items Answer ABUSE RISK SCREEN: Has anyone close to you tried to hurt or harm you recentlyo No Do you feel uncomfortable with anyone in your familyo No Has anyone forced you do things that you didnt want to doo No Electronic Signature(s) Signed: 04/12/2020 4:36:43 PM By: Dwayne Pax RN Entered By: Dwayne Rocha on 04/07/2020 13:46:44 -------------------------------------------------------------------------------- Activities of Daily Living Details Patient Name: Date of Service: Dwayne Rocha 04/07/2020 1:15 PM Medical Record Number: [id number] Patient Account Number: [id number] Date of Birth/Sex: Treating RN: 02-09-1976 (44 y.o. Dwayne Rocha) Dwayne Rocha Primary Care [name]: Dwayne Rocha Other Clinician: Referring Murad Staples: Treating [name]/Extender: Dwayne Rocha in Treatment: 0 Activities of Daily Living Items Answer Activities of Daily Living (Please select one for each item) Drive Automobile Completely Able T Medications ake Completely Able Use T elephone Completely Able Care for Appearance Completely Able Use T oilet Completely Able Bath / Shower Completely Able Dress Self Completely Able Feed Self Completely Able Walk Completely Able Get In / Out Bed Completely Able Housework Completely Able Prepare Meals Completely Able Handle Money Completely Able Shop for Self Completely Able Electronic Signature(s) Signed: 04/12/2020 4:36:43 PM By: Dwayne Pax RN Entered By: Dwayne Rocha  on 04/07/2020 13:47:08 -------------------------------------------------------------------------------- Education Screening Details Patient Name: Date of Service: Dwayne Rocha 04/07/2020 1:15 PM Medical Record Number: [id number] Patient Account Number: [id number] Date of Birth/Sex: Treating RN: 11-09-1976 (44 y.o. Dwayne Rocha) Dwayne Rocha Primary Care Nareh Matzke: Dwayne Rocha Other Clinician: Referring Ashkan Chamberland: Treating Khayri Kargbo/Extender: Dwayne Rocha in Treatment: 0 Primary Learner Assessed: Patient Learning Preferences/Education Level/Primary Language Learning Preference: Explanation Highest Education Level: High School Preferred Language: English Cognitive Barrier Language Barrier: No Translator Needed: No Memory Deficit: No Emotional Barrier: No Cultural/Religious Beliefs Affecting Medical Care: No Physical Barrier Impaired Vision: No Impaired Hearing: No Decreased Hand dexterity: No Knowledge/Comprehension Knowledge Level: Medium Comprehension Level: High Ability to understand written instructions: High Ability to understand verbal instructions: High Motivation Anxiety Level: Calm Cooperation: Cooperative Education Importance: Acknowledges Need Interest in Health Problems: Asks Questions Perception: Coherent Willingness to Engage in Self-Management High Activities: Readiness to Engage in Self-Management High Activities: Electronic Signature(s) Signed: 04/12/2020 4:36:43 PM By: Dwayne Pax RN Entered By: Dwayne Rocha on 04/07/2020 13:47:35 -------------------------------------------------------------------------------- Fall Risk Assessment Details Patient Name: Date of Service: Dwayne Rocha 04/07/2020 1:15 PM Medical Record Number: [id number] Patient Account Number: [id number] Date of Birth/Sex: Treating RN: February 20, 1976 (44 y.o. Dwayne Rocha Primary Care Hadasa Gasner: Dwayne Rocha Other Clinician: Referring Dwayne Rocha: Treating  Dwayne Rocha/Extender: Dwayne Rocha in Treatment: 0 Fall Risk Assessment Items Have you had 2 or more falls in the last 12 monthso 0 No Have you had any fall that resulted in injury in the last 12 monthso 0 No FALLS RISK SCREEN History of falling - immediate or within 3 months 0 No Secondary diagnosis (Do you have 2 or more medical diagnoseso) 0 No Ambulatory aid None/bed rest/wheelchair/nurse 0 No Crutches/cane/walker 0 No Furniture 0 No Intravenous therapy Access/Saline/Heparin Lock 0 No Gait/Transferring Normal/ bed rest/ wheelchair 0 No Weak (short steps with or without shuffle, stooped but able to  lift head while walking, may seek 0 No support from furniture) Impaired (short steps with shuffle, may have difficulty arising from chair, head down, impaired 0 No balance) Mental Status Oriented to own ability 0 No Electronic Signature(s) Signed: 04/12/2020 4:36:43 PM By: Dwayne Coria RN Entered By: Dwayne Rocha on 04/07/2020 13:47:54 -------------------------------------------------------------------------------- Foot Assessment Details Patient Name: Date of Service: Dwayne Rocha 04/07/2020 1:15 PM Medical Record Number: [id number] Patient Account Number: [id number] Date of Birth/Sex: Treating RN: 1976-06-19 (44 y.o. Dwayne Rocha) Dwayne Rocha Primary Care Dwayne Rocha: Dwayne Rocha Other Clinician: Referring Dwayne Rocha: Treating Dwayne Rocha/Extender: Dwayne Rocha in Treatment: 0 Foot Assessment Items Site Locations + = Sensation present, - = Sensation absent, C = Callus, U = Ulcer R = Redness, W = Warmth, M = Maceration, PU = Pre-ulcerative lesion F = Fissure, S = Swelling, D = Dryness Assessment Right: Left: Other Deformity: No No Prior Foot Ulcer: No No Prior Amputation: No No Charcot Joint: No No Ambulatory Status: Ambulatory Without Help Gait: Steady Electronic Signature(s) Signed: 04/12/2020 4:36:43 PM By: Dwayne Coria RN Entered  By: Dwayne Rocha on 04/07/2020 13:50:46 -------------------------------------------------------------------------------- Nutrition Risk Screening Details Patient Name: Date of ServiceFabio Rocha 04/07/2020 1:15 PM Medical Record Number: [id number] Patient Account Number: [id number] Date of Birth/Sex: Treating RN: 03/18/1976 (44 y.o. Dwayne Rocha) Dwayne Rocha Primary Care Saif Peter: Dwayne Rocha Other Clinician: Referring Eldin Bonsell: Treating Nhyla Nappi/Extender: Dwayne Rocha in Treatment: 0 Height (in): 71 Weight (lbs): 335 Body Mass Index (BMI): 46.7 Nutrition Risk Screening Items Score Screening NUTRITION RISK SCREEN: I have an illness or condition that made me change the kind and/or amount of food I eat 0 No I eat fewer than two meals per day 0 No I eat few fruits and vegetables, or milk products 0 No I have three or more drinks of beer, liquor or wine almost every day 0 No I have tooth or mouth problems that make it hard for me to eat 0 No I don't always have enough money to buy the food I need 0 No I eat alone most of the time 0 No I take three or more different prescribed or over-the-counter drugs a day 1 Yes Without wanting to, I have lost or gained 10 pounds in the last six months 0 No I am not always physically able to shop, cook and/or feed myself 0 No Nutrition Protocols Good Risk Protocol 0 No interventions needed Moderate Risk Protocol High Risk Proctocol Risk Level: Good Risk Score: 1 Electronic Signature(s) Signed: 04/12/2020 4:36:43 PM By: Dwayne Coria RN Entered By: Dwayne Rocha on 04/07/2020 13:48:07

## 2020-04-12 NOTE — Progress Notes (Signed)
COYT, GOVONI ([id number]) Visit Report for 04/07/2020 Allergy List Details Patient Name: Date of Service: Dwayne Rocha 04/07/2020 1:15 PM Medical Record Number: [id number] Patient Account Number: [id number] Date of Birth/Sex: Treating RN: 1976/03/15 (44 y.o. Dwayne Rocha) Yevonne Pax Primary Care Jalil Lorusso: Fleet Contras Other Clinician: Referring Lemma Tetro: Treating Osinachi Navarrette/Extender: [name] in Treatment: 0 Allergies Active Allergies No Known Allergies Allergy Notes Electronic Signature(s) Signed: 04/12/2020 4:36:43 PM By: Yevonne Pax RN Entered By: Yevonne Pax on 04/07/2020 13:38:18 -------------------------------------------------------------------------------- Arrival Information Details Patient Name: Date of Service: Dwayne Rocha 04/07/2020 1:15 PM Medical Record Number: [id number] Patient Account Number: [id number] Date of Birth/Sex: Treating RN: 07-02-1976 (44 y.o. Dwayne Rocha Primary Care Lilinoe Acklin: Fleet Contras Other Clinician: Referring Laquanda Bick: Treating Astor Gentle/Extender: [name] in Treatment: 0 Visit Information Patient Arrived: Ambulatory Arrival Time: 13:25 Accompanied By: self Transfer Assistance: None Patient Identification Verified: No Secondary Verification Process Completed: No Patient Requires Transmission-Based Precautions: No Patient Has Alerts: No Electronic Signature(s) Signed: 04/12/2020 4:36:43 PM By: Yevonne Pax RN Entered By: Yevonne Pax on 04/07/2020 13:36:41 -------------------------------------------------------------------------------- Clinic Level of Care Assessment Details Patient Name: Date of Service: Dwayne Rocha 04/07/2020 1:15 PM Medical Record Number: [id number] Patient Account Number: [id number] Date of Birth/Sex: Treating RN: Sep 01, 1976 (44 y.o. Dwayne Rocha Primary Care Meko Bellanger: Fleet Contras Other Clinician: Referring Tyreesha Maharaj: Treating  Kylyn Mcdade/Extender: [name] in Treatment: 0 Clinic Level of Care Assessment Items TOOL 1 Quantity Score X- 1 0 Use when EandM and Procedure is performed on INITIAL visit ASSESSMENTS - Nursing Assessment / Reassessment X- 1 20 General Physical Exam (combine w/ comprehensive assessment (listed just below) when performed on new pt. evals) X- 1 25 Comprehensive Assessment (HX, ROS, Risk Assessments, Wounds Hx, etc.) ASSESSMENTS - Wound and Skin Assessment / Reassessment []  - 0 Dermatologic / Skin Assessment (not related to wound area) ASSESSMENTS - Ostomy and/or Continence Assessment and Care []  - 0 Incontinence Assessment and Management []  - 0 Ostomy Care Assessment and Management (repouching, etc.) PROCESS - Coordination of Care X - Simple Patient / Family Education for ongoing care 1 15 []  - 0 Complex (extensive) Patient / Family Education for ongoing care []  - 0 Staff obtains , Records, T Results / Process Orders est []  - 0 Staff telephones HHA, Nursing Homes / Clarify orders / etc []  - 0 Routine Transfer to another Facility (non-emergent condition) []  - 0 Routine [facility] Admission (non-emergent condition) X- 1 15 New Admissions / / Ordering NPWT Apligraf, etc. , []  - 0 Emergency [facility] Admission (emergent condition) PROCESS - Special Needs []  - 0 Pediatric / Minor Patient Management []  - 0 Isolation Patient Management []  - 0 Hearing / Language / Visual special needs []  - 0 Assessment of Community assistance (transportation, D/C planning, etc.) []  - 0 Additional assistance / Altered mentation []  - 0 Support Surface(s) Assessment (bed, cushion, seat, etc.) INTERVENTIONS - Miscellaneous []  - 0 External ear exam []  - 0 Patient Transfer (multiple staff / / Similar devices) []  - 0 Simple Staple / Suture removal (25 or less) []  - 0 Complex Staple / Suture removal (26 or more) []  -  0 Hypo/Hyperglycemic Management (do not check if billed separately) X- 1 15 Ankle / Brachial Index (ABI) - do not check if billed separately Has the patient been seen at the [facility] within the last three years: Yes Total Score: 90 Level Of Care: New/Established - Level 3 Electronic Signature(s)  Signed: 04/07/2020 4:59:31 PM By: Kela Millin Entered By: Kela Millin on 04/07/2020 14:28:46 -------------------------------------------------------------------------------- Encounter Discharge Information Details Patient Name: Date of Service: Dwayne Rocha, Dwayne Rocha 04/07/2020 1:15 PM Medical Record Number: [id number] Patient Account Number: [id number] Date of Birth/Sex: Treating RN: 1976/05/15 (44 y.o. Dwayne Rocha Primary Care Shebra Muldrow: [name] Other Clinician: Referring Damonica Chopra: Treating Oakleigh Hesketh/Extender: Caralyn Guile in Treatment: 0 Encounter Discharge Information Items Post Procedure Vitals Discharge Condition: Stable Temperature (F): 98.4 Ambulatory Status: Ambulatory Pulse (bpm): 91 Discharge Destination: Home Respiratory Rate (breaths/min): 18 Transportation: Private Auto Blood Pressure (mmHg): 118/60 Accompanied By: self Schedule Follow-up Appointment: Yes Clinical Summary of Care: Patient Declined Electronic Signature(s) Signed: 04/07/2020 4:59:25 PM By: Baruch Gouty RN, BSN Entered By: Baruch Gouty on 04/07/2020 14:46:03 -------------------------------------------------------------------------------- Lower Extremity Assessment Details Patient Name: Date of Service: Dwayne Rocha 04/07/2020 1:15 PM Medical Record Number: [id number] Patient Account Number: [id number] Date of Birth/Sex: Treating RN: [birth year] (44 y.o. Oval Linsey Primary Care Kennieth Plotts: [name] Other Clinician: Referring Xaidyn Kepner: Treating Reika Callanan/Extender: Caralyn Guile in Treatment: 0 Edema Assessment Assessed:  Shirlyn Goltz: No] [[facility]: No] E[Left: dema] [[facility]ocha: :] Calf Left: [facility]: Point of Measurement: 47 cm From Medial Instep cm 42.5 cm Ankle Left: [facility]: Point of Measurement: 12 cm From Medial Instep cm 24.5 cm Vascular Assessment Blood Pressure: Brachial: [Rocha:118] Ankle: [[facility]ocha:Dorsalis Pedis: 130 1.10] Electronic Signature(s) Signed: 04/12/2020 4:36:43 PM By: [name] RN Entered By: [name] on 04/07/2020 13:56:07 -------------------------------------------------------------------------------- [place] Details Patient Name: Date of Service: Jamelle [facility] 04/07/2020 1:15 PM Medical Record Number: [id number] Patient Account Number: [id number] Date of Birth/Sex: Treating RN: Mar 26, 1976 (44 y.o. Marvis Repress Primary Care Tyrelle Raczka: [name] Other Clinician: Referring Nijah Orlich: Treating Theadora Noyes/Extender: Caralyn Guile in Treatment: 0 Active Inactive Nutrition Nursing Diagnoses: Impaired glucose control: actual or potential Goals: Patient/caregiver verbalizes understanding of need to maintain therapeutic glucose control per primary care physician Date Initiated: 04/07/2020 Target Resolution Date: 05/05/2020 Goal Status: Active Interventions: Provide education on nutrition Notes: Wound/Skin Impairment Nursing Diagnoses: Impaired tissue integrity Goals: Ulcer/skin breakdown will have a volume reduction of 30% by week 4 Date Initiated: 04/07/2020 Target Resolution Date: 05/05/2020 Goal Status: Active Interventions: Provide education on ulcer and skin care Notes: Electronic Signature(s) Signed: 04/07/2020 4:59:31 PM By: Kela Millin Entered By: Kela Millin on 04/07/2020 14:18:59 -------------------------------------------------------------------------------- Pain Assessment Details Patient Name: Date of Service: Dwayne [facility] 04/07/2020 1:15 PM Medical Record Number: [id number] Patient Account  Number: [id number] Date of Birth/Sex: Treating RN: Dec 20, 1975 (44 y.o. Oval Linsey Primary Care Verita Kuroda: [name] Other Clinician: Referring Christean Silvestri: Treating Leveda Kendrix/Extender: Caralyn Guile in Treatment: 0 Active Problems Location of Pain Severity and Description of Pain Patient Has Paino No Site Locations Pain Management and Medication Current Pain Management: Electronic Signature(s) Signed: 04/12/2020 4:36:43 PM By: [name] RN Entered By: [name] on 04/07/2020 14:02:52 -------------------------------------------------------------------------------- Patient/Caregiver Education Details Patient Name: Date of Service: Dwayne Rocha 5/28/2021andnbsp1:15 PM Medical Record Number: [id number] Patient Account Number: [id number] Date of Birth/Gender: Treating RN: 15-Apr-1976 (44 y.o. Marvis Repress Primary Care Physician: [name] Other Clinician: Referring Physician: Treating Physician/Extender: Caralyn Guile in Treatment: 0 Education Assessment Education Provided To: Patient Education Topics Provided Nutrition: Handouts: Elevated Blood Sugars: How Do They Affect Wound Healing Methods: Explain/Verbal Responses: State content correctly Wound/Skin Impairment: Handouts: Caring for Your Ulcer Methods: Explain/Verbal Responses: State content correctly Electronic Signature(s) Signed: 04/07/2020 4:59:31  PM By: Cherylin Mylar Entered By: Cherylin Mylar on 04/07/2020 14:19:43 -------------------------------------------------------------------------------- Wound Assessment Details Patient Name: Date of Service: Dwayne Rocha 04/07/2020 1:15 PM Medical Record Number: [id number] Patient Account Number: [id number] Date of Birth/Sex: Treating RN: 1976-09-18 (44 y.o. Dwayne Rocha) Yevonne Pax Primary Care [place] Stell: Fleet Contras Other Clinician: Referring Latesa Fratto: Treating Jagar Lua/Extender: [name] in Treatment: 0 Wound Status Wound Number: 1 Primary Etiology: Diabetic Wound/Ulcer of the Lower Extremity Wound Location: [facility] [place] Wound Status: Open Wounding Event: Gradually Appeared Comorbid History: Hypertension, Type II Diabetes Date Acquired: 03/24/2020 Weeks Of Treatment: 0 Clustered Wound: No Photos Photo Uploaded By: Benjaman Kindler on 04/11/2020 10:21:32 Wound Measurements Length: (cm) 2.2 Width: (cm) 2.4 Depth: (cm) 0.1 Area: (cm) 4.147 Volume: (cm) 0.415 % Reduction in Area: % Reduction in Volume: Epithelialization: None Tunneling: No Undermining: No Wound Description Classification: Grade 2 Exudate Amount: Medium Exudate Type: Serosanguineous Exudate Color: red, brown Foul Odor After Cleansing: No Slough/Fibrino Yes Wound Bed Granulation Amount: Small (1-33%) Exposed Structure Granulation Quality: Red Fascia Exposed: No Necrotic Amount: Large (67-100%) Fat Layer (Subcutaneous Tissue) Exposed: Yes Necrotic Quality: Adherent Slough Tendon Exposed: No Muscle Exposed: No Joint Exposed: No Bone Exposed: No Electronic Signature(s) Signed: 04/12/2020 4:36:43 PM By: Yevonne Pax RN Entered By: Yevonne Pax on 04/07/2020 14:02:42 -------------------------------------------------------------------------------- Wound Assessment Details Patient Name: Date of ServiceLyman [facility] 04/07/2020 1:15 PM Medical Record Number: [id number] Patient Account Number: [id number] Date of Birth/Sex: Treating RN: May 13, 1976 (44 y.o. Dwayne Rocha Primary Care Mekaila Tarnow: Fleet Contras Other Clinician: Referring Sopheap Basic: Treating Jamie-Lee Galdamez/Extender: [name] in Treatment: 0 Wound Status Wound Number: 2 Primary Etiology: Diabetic Wound/Ulcer of the Lower Extremity Wound Location: [facility], [place] Wound Status: Open Wounding Event: Gradually Appeared Comorbid History: Hypertension, Type II  Diabetes Date Acquired: 04/07/2020 Weeks Of Treatment: 0 Clustered Wound: No Wound Measurements Length: (cm) 0.4 Width: (cm) 1 Depth: (cm) 0.1 Area: (cm) 0.314 Volume: (cm) 0.031 % Reduction in Area: % Reduction in Volume: Epithelialization: None Tunneling: No Undermining: No Wound Description Classification: Grade 1 Wound Margin: Distinct, outline attached Exudate Amount: Medium Exudate Type: Serosanguineous Exudate Color: red, brown Foul Odor After Cleansing: No Slough/Fibrino No Wound Bed Granulation Amount: Large (67-100%) Exposed Structure Granulation Quality: Pink Fascia Exposed: No Necrotic Amount: None Present (0%) Fat Layer (Subcutaneous Tissue) Exposed: Yes Tendon Exposed: No Muscle Exposed: No Joint Exposed: No Bone Exposed: No Electronic Signature(s) Signed: 04/07/2020 4:59:31 PM By: Cherylin Mylar Entered By: Cherylin Mylar on 04/07/2020 14:26:10 -------------------------------------------------------------------------------- Vitals Details Patient Name: Date of Service: Dwayne [facility], [facility] M 04/07/2020 1:15 PM Medical Record Number: [id number] Patient Account Number: [id number] Date of Birth/Sex: Treating RN: 12/12/1975 (44 y.o. Dwayne Rocha) Yevonne Pax Primary Care Lucresia Simic: Fleet Contras Other Clinician: Referring Brissa Asante: Treating Ziggy Reveles/Extender: [name] in Treatment: 0 Vital Signs Time Taken: 13:36 Temperature (F): 98.4 Height (in): 71 Pulse (bpm): 91 Source: Stated Respiratory Rate (breaths/min): 18 Weight (lbs): 335 Blood Pressure (mmHg): 118/60 Source: Stated Capillary Blood Glucose (mg/dl): 509 Body Mass Index (BMI): 46.7 Reference Range: 80 - 120 mg / dl Notes CBG per patient Electronic Signature(s) Signed: 04/12/2020 4:36:43 PM By: Yevonne Pax RN Entered By: Yevonne Pax on 04/07/2020 13:37:46

## 2020-04-12 NOTE — Progress Notes (Signed)
Dwayne Rocha, Dwayne Rocha ([id number]) Visit Report for 04/07/2020 Chief Complaint Document Details Patient Name: Date of Service: Dwayne Rocha 04/07/2020 1:15 PM Medical Record Number: [id number] Patient Account Number: [id number] Date of Birth/Sex: Treating RN: 1976/03/26 (44 y.o. Katherina Right Primary Care Provider: Fleet Contras Other Clinician: Referring Provider: Treating Provider/Extender: [name] in Treatment: 0 Information Obtained from: Patient Chief Complaint Referred by PCP for right great toe wound x 2 weeks Electronic Signature(s) Signed: 04/07/2020 2:35:26 PM By: Cassandria Anger MD, MBA Entered By: Cassandria Anger on 04/07/2020 14:35:26 -------------------------------------------------------------------------------- Debridement Details Patient Name: Date of Service: Dwayne Rocha 04/07/2020 1:15 PM Medical Record Number: [id number] Patient Account Number: [id number] Date of Birth/Sex: Treating RN: Aug 18, 1976 (44 y.o. Katherina Right Primary Care Provider: Fleet Contras Other Clinician: Referring Provider: Treating Provider/Extender: [name] in Treatment: 0 Debridement Performed for Assessment: Wound #1 Right T Great oe Performed By: Physician Cassandria Anger, MD Debridement Type: Debridement Severity of Tissue Pre Debridement: Fat layer exposed Level of Consciousness (Pre-procedure): Awake and Alert Pre-procedure Verification/Time Out Yes - 14:25 Taken: Start Time: 14:25 Pain Control: Other : benzocaine, 20% T Area Debrided (L x W): otal 2 (cm) x 2 (cm) = 4 (cm) Tissue and other material debrided: Viable, Non-Viable, Callus Level: Non-Viable Tissue Debridement Description: Selective/Open Wound Instrument: Blade, Forceps, Scissors Bleeding: None End Time: 14:26 Procedural Pain: 0 Post Procedural Pain: 0 Response to Treatment: Procedure was tolerated well Level of Consciousness (Post-  Awake and Alert procedure): Post Debridement Measurements of Total Wound Length: (cm) 2.2 Width: (cm) 2.4 Depth: (cm) 0.1 Volume: (cm) 0.415 Character of Wound/Ulcer Post Debridement: Improved Severity of Tissue Post Debridement: Fat layer exposed Post Procedure Diagnosis Same as Pre-procedure Electronic Signature(s) Signed: 04/07/2020 4:30:26 PM By: Cassandria Anger MD, MBA Signed: 04/07/2020 4:59:31 PM By: Cherylin Mylar Entered By: Cherylin Mylar on 04/07/2020 14:27:43 -------------------------------------------------------------------------------- HPI Details Patient Name: Date of Service: Dwayne Rocha, Dwayne Rocha 04/07/2020 1:15 PM Medical Record Number: [id number] Patient Account Number: [id number] Date of Birth/Sex: Treating RN: 03/16/1976 (44 y.o. Katherina Right Primary Care Provider: Fleet Contras Other Clinician: Referring Provider: Treating Provider/Extender: [name] in Treatment: 0 History of Present Illness HPI Description: 87 y o male with diabetes and diabetic neuropathy with right great toe blister that ruptured 2 weeks ago, he was in ED and received PO clindamycin and after that started on Keflex by PCP few days back He has no wound care other than bactoban to the area daily He gives no history of wounds He has T2DM and A1c listed as 5.8, he scored 2/10 on monofilament testing, ABI on right is 1.1 Electronic Signature(s) Signed: 04/07/2020 2:37:29 PM By: Cassandria Anger MD, MBA Entered By: Cassandria Anger on 04/07/2020 14:37:29 -------------------------------------------------------------------------------- Physical Exam Details Patient Name: Date of Service: Dwayne Rocha 04/07/2020 1:15 PM Medical Record Number: [id number] Patient Account Number: [id number] Date of Birth/Sex: Treating RN: 09/20/1976 (44 y.o. Katherina Right Primary Care Provider: Fleet Contras Other Clinician: Referring Provider: Treating  Provider/Extender: [name] in Treatment: 0 Constitutional alert and oriented x 3. sitting or standing blood pressure is within target range for patient.. supine blood pressure is within target range for patient.. pulse regular and within target range for patient.Marland Kitchen respirations regular, non-labored and within target range for patient.Marland Kitchen temperature within target range for patient.. . . Well- nourished and well-hydrated in no acute distress. Eyes conjunctiva clear no eyelid edema noted. pupils equal round  and reactive to light and accommodation. Ears, Nose, Mouth, and Throat no gross abnormality of ear auricles or external auditory canals. normal hearing noted during conversation. mucus membranes moist. Neck supple with no LAD noted in anterior or posterior cervical chain. not enlarged. Respiratory normal breathing without difficulty. clear to auscultation bilaterally. Cardiovascular regular rate and rhythm with normal S1, S2. no bruits with no significant JVD. 2+ femoral pulses. 2+ dorsalis pedis/posterior tibialis pulses. no clubbing, cyanosis, significant edema, <3 sec cap refill. Gastrointestinal (GI) soft, non-tender, non-distended, +BS. no hepatosplenomegaly. no ventral hernia noted. Musculoskeletal normal gait and posture. no significant deformity or arthritic changes, no loss or range of motion, no clubbing. full range of motion without deformity. full range of motion without deformity. full range of motion with greater than 10 degrees of flexion of the ankle. full range of motion with greater than 10 degrees of flexion of the ankle. Integumentary (Hair, Skin) normal hair distribution and pattern. skin pink, warm, dry. Neurological cranial nerves 2-12 intact. Patient has normal sensation in the feet bilaterally to light touch. Psychiatric this patient is able to make decisions and demonstrates good insight into disease process. Alert and Oriented x 3.  pleasant and cooperative. Notes Right great toe plantar wound with epithelialization but for a rim of open area, and a smaller open area crease on bottom of great toe, the top of the wound adjacent to toenail is calloused with loose thickened skin that i removed partially with scalpel , forceps Electronic Signature(s) Signed: 04/07/2020 2:39:33 PM By: Cassandria Anger MD, MBA Entered By: Cassandria Anger on 04/07/2020 14:39:32 -------------------------------------------------------------------------------- Physician Orders Details Patient Name: Date of Service: Dwayne Rocha 04/07/2020 1:15 PM Medical Record Number: [id number] Patient Account Number: [id number] Date of Birth/Sex: Treating RN: July 05, 1976 (44 y.o. Katherina Right Primary Care Provider: Fleet Contras Other Clinician: Referring Provider: Treating Provider/Extender: [name] in Treatment: 0 Verbal / Phone Orders: No Diagnosis Coding Follow-up Appointments Return Appointment in 1 week. Dressing Change Frequency Change Dressing every other day. Wound Cleansing May shower and wash wound with soap and water. - with dressing change Primary Wound Dressing Wound #1 Right T Great oe Cutimed Sorbact - moisten with hydrogel or [place] Jelly Wound #2 Right,Distal T Great oe Cutimed Sorbact - moisten with hydrogel or [place] Jelly Secondary Dressing Wound #1 Right T Great oe Foam - foam donut Kerlix/Rolled Gauze Dry Gauze Edema Control Avoid standing for long periods of time Elevate legs to the level of the heart or above for 30 minutes daily and/or when sitting, a frequency of: Electronic Signature(s) Signed: 04/07/2020 4:30:26 PM By: Cassandria Anger MD, MBA Signed: 04/07/2020 4:59:31 PM By: Cherylin Mylar Entered By: Cherylin Mylar on 04/07/2020 14:30:55 -------------------------------------------------------------------------------- Problem List Details Patient Name: Date of Service: Dwayne Rocha 04/07/2020 1:15 PM Medical Record Number: [id number] Patient Account Number: [id number] Date of Birth/Sex: Treating RN: 02-04-1976 (44 y.o. 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 Marvis Repress Primary Care Provider: [name] Other Clinician: Referring Provider: Treating Provider/Extender: Caralyn Guile in Treatment: 0 Subjective Chief Complaint Information obtained from Patient Referred by PCP for right great toe wound x 2 weeks History of Present Illness (HPI) 7 y o male with diabetes and diabetic neuropathy with right great toe blister that ruptured 2 weeks ago, he was in ED and received PO clindamycin and after that started on Keflex by PCP few days back He has no wound care other than bactoban to the area daily He gives no history of wounds He has T2DM and A1c listed as 5.8, he scored 2/10 on monofilament testing, ABI on right is 1.1 Patient History Information obtained from Patient. Allergies No Known Allergies Family History Diabetes - Maternal Grandparents,Mother,  Hypertension - Maternal Grandparents,Mother, No family history of Cancer, Heart Disease, Hereditary Spherocytosis, Kidney Disease, Lung Disease, Seizures, Stroke, Thyroid Problems, Tuberculosis. Social History Former smoker, Marital Status - Married, Alcohol Use - Rarely, Drug Use - No History, Caffeine Use - Moderate. Medical History Eyes Denies history of Cataracts, Glaucoma, Optic Neuritis Ear/Nose/Mouth/Throat Denies history of Chronic sinus problems/congestion, Middle ear problems Hematologic/Lymphatic Denies history of Anemia, Hemophilia, Human Immunodeficiency Virus, Lymphedema, Sickle Cell Disease Respiratory Denies history of Aspiration, Asthma, Chronic Obstructive Pulmonary Disease (COPD), Pneumothorax, Sleep Apnea, Tuberculosis Cardiovascular Patient has history of Hypertension Denies history of Angina, Arrhythmia, Congestive Heart Failure, Coronary Artery Disease, Deep Vein Thrombosis, Hypotension, Myocardial Infarction, Peripheral Arterial Disease, Peripheral Venous Disease, Phlebitis, Vasculitis Gastrointestinal Denies history of Cirrhosis , Colitis, Crohnoos, Hepatitis A, Hepatitis B, Hepatitis C Endocrine Patient has history of Type II Diabetes Denies history of Type I Diabetes Genitourinary Denies history of End Stage Renal Disease Immunological Denies history of Lupus Erythematosus, Raynaudoos, Scleroderma Integumentary (Skin) Denies history of History of Burn Musculoskeletal Denies history of Gout, Rheumatoid Arthritis, Osteoarthritis, Osteomyelitis Neurologic Denies history of Dementia, Neuropathy, Quadriplegia, Paraplegia, Seizure Disorder Oncologic Denies history of Received Chemotherapy, Received Radiation Psychiatric Denies history of Anorexia/bulimia, Confinement Anxiety Patient is treated with Insulin, Oral Agents. Blood sugar is tested. Blood sugar results noted at the following times: Breakfast - 90-120, Lunch - 80-90, Dinner - 80-120, Bedtime -  80-100. Review of Systems (ROS) Constitutional Symptoms (General Health) Denies complaints or symptoms of Fatigue, Fever, Chills, Marked Weight Change. Eyes Denies complaints or symptoms of Dry Eyes, Vision Changes, Glasses / Contacts. Ear/Nose/Mouth/Throat Denies complaints or symptoms of Chronic sinus problems or rhinitis. Respiratory Denies complaints or symptoms of Chronic or frequent coughs, Shortness of Breath. Cardiovascular Denies complaints or symptoms of Chest pain. Gastrointestinal Denies complaints or symptoms of Frequent diarrhea, Nausea, Vomiting. Endocrine Denies complaints or symptoms of Heat/cold intolerance. Genitourinary Denies complaints or symptoms of Frequent urination. Integumentary (Skin) Complains or has symptoms of Wounds. Musculoskeletal Denies complaints or symptoms of Muscle Pain, Muscle Weakness. Neurologic Denies complaints or symptoms of Numbness/parasthesias. Psychiatric Denies complaints or symptoms of Claustrophobia, Suicidal. Objective Constitutional alert and oriented x 3. sitting or standing blood pressure is within target range for patient.. supine blood pressure is within target range for patient.. pulse regular and within target range for patient.Marland Kitchen respirations regular, non-labored and within target range for patient.Marland Kitchen temperature within target range for patient.. Well- nourished and well-hydrated in no acute distress. Vitals Time Taken: 1:36 PM, Height: 71 in, Source: Stated, Weight: 335 lbs, Source: Stated, BMI: 46.7, Temperature: 98.4 F, Pulse: 91 bpm, Respiratory Rate: 18 breaths/min, Blood Pressure:  118/60 mmHg, Capillary Blood Glucose: 125 mg/dl. General Notes: CBG per patient Eyes conjunctiva clear no eyelid edema noted. pupils equal round and reactive to light and accommodation. Ears, Nose, Mouth, and Throat no gross abnormality of ear auricles or external auditory canals. normal hearing noted during conversation. mucus membranes  moist. Neck supple with no LAD noted in anterior or posterior cervical chain. not enlarged. Respiratory normal breathing without difficulty. clear to auscultation bilaterally. Cardiovascular regular rate and rhythm with normal S1, S2. no bruits with no significant JVD. 2+ femoral pulses. 2+ dorsalis pedis/posterior tibialis pulses. no clubbing, cyanosis, significant edema, Gastrointestinal (GI) soft, non-tender, non-distended, +BS. no hepatosplenomegaly. no ventral hernia noted. Musculoskeletal normal gait and posture. no significant deformity or arthritic changes, no loss or range of motion, no clubbing. full range of motion without deformity. full range of motion without deformity. full range of motion with greater than 10 degrees of flexion of the ankle. full range of motion with greater than 10 degrees of flexion of the ankle. Neurological cranial nerves 2-12 intact. Patient has normal sensation in the feet bilaterally to light touch. Psychiatric this patient is able to make decisions and demonstrates good insight into disease process. Alert and Oriented x 3. pleasant and cooperative. General Notes: Right great toe plantar wound with epithelialization but for a rim of open area, and a smaller open area crease on bottom of great toe, the top of the wound adjacent to toenail is calloused with loose thickened skin that i removed partially with scalpel , forceps Integumentary (Hair, Skin) normal hair distribution and pattern. skin pink, warm, dry. Wound #1 status is Open. Original cause of wound was Gradually Appeared. The wound is located on the Right T Great. The wound measures 2.2cm length x oe 2.4cm width x 0.1cm depth; 4.147cm^2 area and 0.415cm^3 volume. There is Fat Layer (Subcutaneous Tissue) Exposed exposed. There is no tunneling or undermining noted. There is a medium amount of serosanguineous drainage noted. There is small (1-33%) red granulation within the wound bed. There is a  large (67-100%) amount of necrotic tissue within the wound bed including Adherent Slough. Wound #2 status is Open. Original cause of wound was Gradually Appeared. The wound is located on the Right,Distal T [place]. The wound measures 0.4cm oe length x 1cm width x 0.1cm depth; 0.314cm^2 area and 0.031cm^3 volume. There is Fat Layer (Subcutaneous Tissue) Exposed exposed. There is no tunneling or undermining noted. There is a medium amount of serosanguineous drainage noted. The wound margin is distinct with the outline attached to the wound base. There is large (67-100%) pink granulation within the wound bed. There is no necrotic tissue within the wound bed. 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 There was a Selective/Open Wound Non-Viable Tissue Debridement with a total area of 4 sq cm performed by Cassandria AngerMadduri, Geron Mulford, MD. With the following instrument(s): Blade, Forceps, and Scissors to remove Viable and Non-Viable tissue/material. Material removed includes Callus after achieving pain control using Other (benzocaine, 20%). No specimens were taken. A time out was conducted at 14:25, prior to the start of the procedure. There was no bleeding. The procedure was tolerated well with a pain level of 0 throughout and a pain level of 0 following  the procedure. Post Debridement Measurements: 2.2cm length x 2.4cm width x 0.1cm depth; 0.415cm^3 volume. Character of Wound/Ulcer Post Debridement is improved. Severity of Tissue Post Debridement is: Fat layer exposed. Post procedure Diagnosis Wound #1: Same as Pre-Procedure Plan Follow-up Appointments: Return Appointment in 1 week. Dressing Change  Frequency: Change Dressing every other day. Wound Cleansing: May shower and wash wound with soap and water. - with dressing change Primary Wound Dressing: Wound #1 Right T Great: oe Cutimed Sorbact - moisten with hydrogel or [place] Jelly Wound #2 Right,Distal T Great: oe Cutimed Sorbact - moisten with hydrogel or [place] Jelly Secondary Dressing: Wound #1 Right T Great: oe Foam - foam donut Kerlix/Rolled Gauze Dry Gauze Edema Control: Avoid standing for long periods of time Elevate legs to the level of the heart or above for 30 minutes daily and/or when sitting, a frequency of: -Right great toe diabetic/neuropathic ulcer, will start with sorb act and dressing to the wound, offloading with inserts that patient is already doing including for history toe boots -Patient will be seen next week in clinic -Diabetes control appears to be good -If his current offloading attempts to this area on the great below the great toe is not working we will have to come up with a different strategy Electronic Signature(s) Signed: 04/07/2020 2:40:57 PM By: Cassandria Anger MD, MBA Entered By: Cassandria Anger on 04/07/2020 14:40:57 -------------------------------------------------------------------------------- HxROS Details Patient Name: Date of Service: Dwayne Rocha 04/07/2020 1:15 PM Medical Record Number: [id number] Patient Account Number: [id number] Date of Birth/Sex: Treating RN: 1976-05-20 (44 y.o. Dwayne Rocha Primary Care Provider: Fleet Contras Other Clinician: Referring Provider: Treating Provider/Extender: [name] in Treatment: 0 Information Obtained From Patient Constitutional Symptoms (General Health) Complaints and Symptoms: Negative for: Fatigue; Fever; Chills; Marked Weight Change Eyes Complaints and Symptoms: Negative for: Dry Eyes; Vision Changes; Glasses / Contacts Medical History: Negative for: Cataracts; Glaucoma; Optic  Neuritis Ear/Nose/Mouth/Throat Complaints and Symptoms: Negative for: Chronic sinus problems or rhinitis Medical History: Negative for: Chronic sinus problems/congestion; Middle ear problems Respiratory Complaints and Symptoms: Negative for: Chronic or frequent coughs; Shortness of Breath Medical History: Negative for: Aspiration; Asthma; Chronic Obstructive Pulmonary Disease (COPD); Pneumothorax; Sleep Apnea; Tuberculosis Cardiovascular Complaints and Symptoms: Negative for: Chest pain Medical History: Positive for: Hypertension Negative for: Angina; Arrhythmia; Congestive Heart Failure; Coronary Artery Disease; Deep Vein Thrombosis; Hypotension; Myocardial Infarction; Peripheral Arterial Disease; Peripheral Venous Disease; Phlebitis; Vasculitis Gastrointestinal Complaints and Symptoms: Negative for: Frequent diarrhea; Nausea; Vomiting Medical History: Negative for: Cirrhosis ; Colitis; Crohns; Hepatitis A; Hepatitis B; Hepatitis C Endocrine Complaints and Symptoms: Negative for: Heat/cold intolerance Medical History: Positive for: Type II Diabetes Negative for: Type I Diabetes Time with diabetes: 20 years Treated with: Insulin, Oral agents Blood sugar tested every day: Yes T ested : 4 times per day Blood sugar testing results: Breakfast: 90-120; Lunch: 80-90; Dinner: 80-120; Bedtime: 80-100 Genitourinary Complaints and Symptoms: Negative for: Frequent urination Medical History: Negative for: End Stage Renal Disease Integumentary (Skin) Complaints and Symptoms: Positive for: Wounds Medical History: Negative for: History of Burn Musculoskeletal Complaints and Symptoms: Negative for: Muscle Pain; Muscle Weakness Medical History: Negative for: Gout; Rheumatoid Arthritis; Osteoarthritis; Osteomyelitis Neurologic Complaints and Symptoms: Negative for: Numbness/parasthesias Medical History: Negative for: Dementia; Neuropathy; Quadriplegia; Paraplegia; Seizure  Disorder Psychiatric Complaints and Symptoms: Negative for: Claustrophobia; Suicidal Medical History: Negative for: Anorexia/bulimia; Confinement Anxiety Hematologic/Lymphatic Medical History: Negative for: Anemia; Hemophilia; Human Immunodeficiency Virus; Lymphedema; Sickle Cell Disease Immunological Medical History: Negative for: Lupus Erythematosus;  Raynauds; Scleroderma Oncologic Medical History: Negative for: Received Chemotherapy; Received Radiation Immunizations Pneumococcal Vaccine: Received Pneumococcal Vaccination: No Implantable Devices None Family and Social History Cancer: No; Diabetes: Yes - Maternal Grandparents,Mother; Heart Disease: No; Hereditary Spherocytosis: No; Hypertension: Yes - Maternal Grandparents,Mother; Kidney Disease: No; Lung Disease: No; Seizures: No; Stroke: No; Thyroid Problems: No; Tuberculosis: No; Former smoker; Marital Status - Married; Alcohol Use: Rarely; Drug Use: No History; Caffeine Use: Moderate Electronic Signature(s) Signed: 04/07/2020 4:30:26 PM By: Cassandria Anger MD, MBA Signed: 04/12/2020 4:36:43 PM By: Yevonne Pax RN Entered By: Yevonne Pax on 04/07/2020 13:46:20 -------------------------------------------------------------------------------- SuperBill Details Patient Name: Date of Service: Dwayne Rocha, Dwayne Rocha 04/07/2020 Medical Record Number: [id number] Patient Account Number: [id number] Date of Birth/Sex: Treating RN: [birth year] (44 y.o. 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 E11.621 Type 2 diabetes mellitus with foot ulcer Facility Procedures CPT4 Code: [id number] Description: 99213 - WOUND CARE VISIT-LEV 3 EST PT Modifier: 25 Quantity: 1 CPT4  Code: [id number] Description: 97597 - DEBRIDE WOUND 1ST 20 SQ CM OR < ICD-10 Diagnosis Description E11.621 Type 2 diabetes mellitus with foot ulcer Modifier: Quantity: 1 Physician Procedures : CPT4 Code Description Modifier [id number] - WC PHYS LEVEL 4 - NEW PT 25 ICD-10 Diagnosis Description E11.621 Type 2 diabetes mellitus with foot ulcer Quantity: 1 : [id number] 97597 - WC PHYS DEBR WO ANESTH 20 SQ CM ICD-10 Diagnosis Description E11.621 Type 2 diabetes mellitus with foot ulcer Quantity: 1 Electronic Signature(s) Signed: 04/07/2020 4:30:26 PM By: Cassandria Anger MD, MBA Signed: 04/07/2020 4:59:31 PM By: Cherylin Mylar Previous Signature: 04/07/2020 2:41:16 PM Version By: Cassandria Anger MD, MBA Entered By: Cherylin Mylar on 04/07/2020 14:42:31

## 2020-04-17 ENCOUNTER — Other Ambulatory Visit (HOSPITAL_COMMUNITY)
Admission: RE | Admit: 2020-04-17 | Discharge: 2020-04-17 | Disposition: A | Discharge status: Home / Self Care | Payer: 59 | Source: Other Acute Inpatient Hospital | Attending: Internal Medicine | Admitting: Internal Medicine

## 2020-04-17 ENCOUNTER — Encounter (HOSPITAL_BASED_OUTPATIENT_CLINIC_OR_DEPARTMENT_OTHER): Payer: 59 | Admitting: Internal Medicine

## 2020-04-17 DIAGNOSIS — E11621 Type 2 diabetes mellitus with foot ulcer: Secondary | ICD-10-CM | POA: Insufficient documentation

## 2020-04-17 NOTE — Progress Notes (Signed)
Dwayne Rocha, Dwayne Rocha ([id number]) Visit Report for 04/17/2020 Debridement Details Patient Name: Date of Service: Dwayne Rocha 04/17/2020 1:45 PM Medical Record Number: [id number] Patient Account Number: [id number] Date of Birth/Sex: Treating RN: [birth year] (44 y.o. Dwayne Rocha Primary Care Provider: Fleet Contras Other Clinician: Referring Provider: Treating Provider/Extender: Burnetta Sabin in Treatment: 1 Debridement Performed for Assessment: Wound #1 Right T Great oe Performed By: Physician Maxwell Caul., MD Debridement Type: Debridement Severity of Tissue Pre Debridement: Fat layer exposed Level of Consciousness (Pre-procedure): Awake and Alert Pre-procedure Verification/Time Out Yes - 15:29 Taken: Start Time: 15:29 Pain Control: Lidocaine 5% topical ointment T Area Debrided (L x W): otal 2.3 (cm) x 2.1 (cm) = 4.83 (cm) Tissue and other material debrided: Viable, Non-Viable, Eschar, Slough, Subcutaneous, Slough Level: Skin/Subcutaneous Tissue Debridement Description: Excisional Instrument: Curette Specimen: Swab, Number of Specimens T aken: 1 Bleeding: Minimum Hemostasis Achieved: Pressure End Time: 15:31 Procedural Pain: 0 Post Procedural Pain: 0 Response to Treatment: Procedure was tolerated well Level of Consciousness (Post- Awake and Alert procedure): Post Debridement Measurements of Total Wound Length: (cm) 2.3 Width: (cm) 2.1 Depth: (cm) 0.1 Volume: (cm) 0.379 Character of Wound/Ulcer Post Debridement: Requires Further Debridement Severity of Tissue Post Debridement: Fat layer exposed Post Procedure Diagnosis Same as Pre-procedure Electronic Signature(s) Signed: 04/17/2020 6:28:13 PM By: Zandra Abts RN, BSN Signed: 04/17/2020 8:16:21 PM By: Baltazar Najjar MD Entered By: Baltazar Najjar on 04/17/2020 16:30:36 -------------------------------------------------------------------------------- HPI Details Patient Name: Date of  Service: Dwayne Rocha 04/17/2020 1:45 PM Medical Record Number: [id number] Patient Account Number: [id number] Date of Birth/Sex: Treating RN: Aug 26, 1976 (44 y.o. Dwayne Rocha Primary Care Provider: Fleet Contras Other Clinician: Referring Provider: Treating Provider/Extender: Burnetta Sabin in Treatment: 1 History of Present Illness HPI Description: 42 y o male with diabetes and diabetic neuropathy with right great toe blister that ruptured 2 weeks ago, he was in ED and received PO clindamycin and after that started on Keflex by PCP few days back He has no wound care other than bactoban to the area daily He gives no history of wounds He has T2DM and A1c listed as 5.8, he scored 2/10 on monofilament testing, ABI on right is 1.1 04/12/2020 upon evaluation today patient appears to be doing somewhat poorly in regard to his great toe ulcer on the right foot. He does have a blister as well on the lateral portion of his foot on the right. With that being said he has been using Sorbact that does not seem to have been beneficial at all in my opinion. I think he may actually benefit from [facility] I would recommend that and send that into the mail order pharmacy for him today. I also think that even though he had a negative x-ray in the ER that he probably needs to have an appointment for an MRI to further evaluate for any signs of deeper infection at this point. The patient is in agreement with that plan as well. 6/7; the patient has a completely nonviable surface to his right plantar first toe wound. He is using Santyl which he managed to obtain for a $50 co-pay. He has his MRI on the 13th. I have not seen his toe previously it looks somewhat swollen but not overtly so. Electronic Signature(s) Signed: 04/17/2020 8:16:21 PM By: Baltazar Najjar MD Entered By: Baltazar Najjar on 04/17/2020  16:32:03 -------------------------------------------------------------------------------- Physical Exam Details Patient Name: Date of Service: Dwayne Rocha 04/17/2020 1:45 PM Medical Record  Number: [id number] Patient Account Number: [id number] Date of Birth/Sex: Treating RN: [birth year] (44 y.o. Dwayne Rocha Primary Care Provider: Fleet Contras Other Clinician: Referring Provider: Treating Provider/Extender: Burnetta Sabin in Treatment: 1 Constitutional Patient is hypertensive.. Pulse regular and within target range for patient.Marland Kitchen Respirations regular, non-labored and within target range.. Temperature is normal and within the target range for the patient.Marland Kitchen Appears in no distress. Cardiovascular Pedal pulses are palpable. Notes Wound exam; completely nonviable surface on the plantar great toe distal aspect. I used a #5 curette to debride this. Post debridement I did a culture of the underlying tissue. Electronic Signature(s) Signed: 04/17/2020 8:16:21 PM By: Baltazar Najjar MD Entered By: Baltazar Najjar on 04/17/2020 16:33:16 -------------------------------------------------------------------------------- Physician Orders Details Patient Name: Date of Service: Dwayne Rocha 04/17/2020 1:45 PM Medical Record Number: [id number] Patient Account Number: [id number] Date of Birth/Sex: Treating RN: 12-19-1975 (44 y.o. 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 Dressing Change Frequency Wound #1 Right T Great oe Change dressing every day. Wound  Cleansing Wound #1 Right T Great oe May shower and wash wound with soap and water. - with dressing change Primary Wound Dressing Wound #1 Right T Great oe Santyl Ointment Secondary Dressing Foam - to lateral foot for protection Wound #1 Right T Great oe Foam - foam donut Kerlix/Rolled Gauze Dry Gauze Edema Control Avoid standing for long periods of time Elevate legs to the level of the heart or above for 30 minutes daily and/or when sitting, a frequency of: - throughout the day Off-Loading Open toe surgical shoe to: - right foot Other: - minimal weight bearing right foot Additional Orders / Instructions Other: - out of work for 1 month Laboratory naerobe culture (MICRO) - right great toe - (ICD10 E11.621 - Type 2 diabetes mellitus with foot Bacteria identified in Unspecified specimen by A ulcer) LOINC Code: 635-3 Convenience Name: Anerobic culture Electronic Signature(s) Signed: 04/17/2020 6:28:13 PM By: Zandra Abts RN, BSN Signed: 04/17/2020 8:16:21 PM By: Baltazar Najjar MD Entered By: Zandra Abts on 04/17/2020 15:33:55 -------------------------------------------------------------------------------- Problem List Details Patient Name: Date of Service: Dwayne Rocha 04/17/2020 1:45 PM Medical Record Number: [id number] Patient Account Number: [id number] Date of Birth/Sex: Treating RN: 1976/09/27 (44 y.o. 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 Dwayne Rocha Primary Care Provider: Fleet Contras Other Clinician: Referring Provider: Treating Provider/Extender: Burnetta Sabin in Treatment: 1 Subjective History of Present Illness (HPI) 6 y o male with diabetes and diabetic neuropathy with right great toe blister that ruptured 2 weeks ago, he was in  ED and received PO clindamycin and after that started on Keflex by PCP few days back He has no wound care other than bactoban to the area daily He gives no history of wounds He has T2DM and A1c listed as 5.8, he scored 2/10 on monofilament testing, ABI on right is 1.1 04/12/2020 upon evaluation today patient appears to be doing somewhat poorly in regard to his great toe ulcer on the right foot. He does have a blister as well on the lateral portion of his foot on the right. With that being said he has been using Sorbact that does not seem to have been beneficial at all in my opinion. I think he may actually benefit from [facility] I would recommend that and send that into the mail order pharmacy for him today. I also think that even though he had a negative x-ray in the ER that he probably needs to have an appointment for an MRI to further evaluate for any signs of deeper infection at this point. The patient is in agreement with that plan as well. 6/7; the patient has a completely nonviable surface to his right plantar first toe wound. He is using Santyl which he managed to obtain for a $50 co-pay. He has his MRI on the 13th. I have not seen his toe previously it looks somewhat swollen but not overtly so. Objective Constitutional Patient is  hypertensive.. Pulse regular and within target range for patient.Marland Kitchen Respirations regular, non-labored and within target range.. Temperature is normal and within the target range for the patient.Marland Kitchen Appears in no distress. Vitals Time Taken: 2:45 PM, Height: 71 in, Weight: 335 lbs, BMI: 46.7, Temperature: 98.2 F, Pulse: 103 bpm, Respiratory Rate: 20 breaths/min, Blood Pressure: 145/87 mmHg. Cardiovascular Pedal pulses are palpable. General Notes: Wound exam; completely nonviable surface on the plantar great toe distal aspect. I used a #5 curette to debride this. Post debridement I did a culture of the underlying tissue. Integumentary (Hair, Skin) Wound #1 status is Open. Original cause of wound was Gradually Appeared. The wound is located on the Right T Great. The wound measures 2.3cm length x oe 2.1cm width x 0.1cm depth; 3.793cm^2 area and 0.379cm^3 volume. There is Fat Layer (Subcutaneous Tissue) Exposed exposed. There is no tunneling or undermining noted. There is a medium amount of serosanguineous drainage noted. The wound margin is distinct with the outline attached to the wound base. There is small (1-33%) red, hyper - granulation within the wound bed. There is a large (67-100%) amount of necrotic tissue within the wound bed including Adherent Slough. 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 There was a Excisional Skin/Subcutaneous Tissue Debridement with a total area of 4.83 sq cm performed by Maxwell Caul., MD. With the following instrument(s): Curette to remove Viable and Non-Viable tissue/material. Material removed includes Eschar, Subcutaneous Tissue, and Slough  after achieving pain control using Lidocaine 5% topical ointment. 1 specimen was taken by a Swab and sent to the lab per facility protocol. A time out was conducted at 15:29, prior to the start of the procedure. A Minimum amount of bleeding was controlled with Pressure. The procedure was tolerated well with a pain level of 0 throughout and a pain level of  0 following the procedure. Post Debridement Measurements: 2.3cm length x 2.1cm width x 0.1cm depth; 0.379cm^3 volume. Character of Wound/Ulcer Post Debridement requires further debridement. Severity of Tissue Post Debridement is: Fat layer exposed. Post procedure Diagnosis Wound #1: Same as Pre-Procedure Plan Follow-up Appointments: Return Appointment in 1 week. Dressing Change Frequency: Wound #1 Right T Great: oe Change dressing every day. Wound Cleansing: Wound #1 Right T Great: oe May shower and wash wound with soap and water. - with dressing change Primary Wound Dressing: Wound #1 Right T Great: oe Santyl Ointment Secondary Dressing: Foam - to lateral foot for protection Wound #1 Right T Great: oe Foam - foam donut Kerlix/Rolled Gauze Dry Gauze Edema Control: Avoid standing for long periods of time Elevate legs to the level of the heart or above for 30 minutes daily and/or when sitting, a frequency of: - throughout the day Off-Loading: Open toe surgical shoe to: - right foot Other: - minimal weight bearing right foot Additional Orders / Instructions: Other: - out of work for 1 month Laboratory ordered were: Anerobic culture - right great toe 1. Continue with the Santyl 2. He is still on Augmentin that was provided last week by Dr. Evette Doffing 3. Awaiting the MRI result. Electronic Signature(s) Signed: 04/17/2020 8:16:21 PM By: Linton Ham MD Entered By: Linton Ham on 04/17/2020 16:34:27 -------------------------------------------------------------------------------- SuperBill Details Patient Name: Date of  Service: Jamelle Haring, Elder Negus 04/17/2020 Medical Record Number: [id number] Patient Account Number: [id number] Date of Birth/Sex: Treating RN: October 13, 1976 (44 y.o. 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

## 2020-04-17 NOTE — Progress Notes (Signed)
Dwayne Rocha, Dwayne Rocha ([id number]) Visit Report for 04/17/2020 Arrival Information Details Patient Name: Date of Service: Dwayne Rocha 04/17/2020 1:45 PM Medical Record Number: [id number] Patient Account Number: [id number] Date of Birth/Sex: Treating RN: Dwayne Rocha (44 y.o. Katherina Right Primary Care Brayon Bielefeld: Fleet Contras Other Clinician: Referring Belkys Henault: Treating Sanna Porcaro/Extender: Burnetta Sabin in Treatment: 1 Visit Information History Since Last Visit All ordered tests and consults were completed: No Patient Arrived: Ambulatory Added or deleted any medications: No Arrival Time: 14:43 Any new allergies or adverse reactions: No Accompanied By: self Had a fall or experienced change in No Transfer Assistance: None activities of daily living that may affect Patient Identification Verified: Yes risk of falls: Secondary Verification Process Completed: Yes Signs or symptoms of abuse/neglect since last visito No Patient Requires Transmission-Based Precautions: No Hospitalized since last visit: No Patient Has Alerts: No Implantable device outside of the clinic excluding No cellular tissue based products placed in the center since last visit: Has Dressing in Place as Prescribed: Yes Pain Present Now: No Electronic Signature(s) Signed: 04/17/2020 5:35:09 PM By: Cherylin Mylar Entered By: Cherylin Mylar on 04/17/2020 14:45:34 -------------------------------------------------------------------------------- Encounter Discharge Information Details Patient Name: Date of Service: Dwayne Rocha 04/17/2020 1:45 PM Medical Record Number: [id number] Patient Account Number: [id number] Date of Birth/Sex: Treating RN: Dwayne Rocha (44 y.o. Tammy Sours Primary Care Tyaisha Cullom: Fleet Contras Other Clinician: Referring Douglas Rooks: Treating Zhion Pevehouse/Extender: Burnetta Sabin in Treatment: 1 Encounter Discharge Information Items Post  Procedure Vitals Discharge Condition: Stable Temperature (F): 98.2 Ambulatory Status: Ambulatory Pulse (bpm): 103 Discharge Destination: Home Respiratory Rate (breaths/min): 20 Transportation: Private Auto Blood Pressure (mmHg): 145/87 Accompanied By: self Schedule Follow-up Appointment: Yes Clinical Summary of Care: Electronic Signature(s) Signed: 04/17/2020 5:09:44 PM By: Shawn Stall Entered By: Shawn Stall on 04/17/2020 15:58:09 -------------------------------------------------------------------------------- Lower Extremity Assessment Details Patient Name: Date of Service: Dwayne Rocha 04/17/2020 1:45 PM Medical Record Number: [id number] Patient Account Number: [id number] Date of Birth/Sex: Treating RN: [birth year] (44 y.o. Katherina Right Primary Care Chancey Cullinane: Fleet Contras Other Clinician: Referring Vaanya Shambaugh: Treating Lakyia Behe/Extender: Burnetta Sabin in Treatment: 1 Edema Assessment Assessed: [name]: No] Franne Forts: No] E[Left: dema] [Right: :] Calf Left: Right: Point of Measurement: 47 cm From Medial Instep cm 44 cm Ankle Left: Right: Point of Measurement: 12 cm From Medial Instep cm 25 cm Electronic Signature(s) Signed: 04/17/2020 5:35:09 PM By: Cherylin Mylar Entered By: Cherylin Mylar on 04/17/2020 14:47:57 -------------------------------------------------------------------------------- Multi Wound Chart Details Patient Name: Date of Service: Dwayne Rocha 04/17/2020 1:45 PM Medical Record Number: [id number] Patient Account Number: [id number] Date of Birth/Sex: Treating RN: Dwayne Rocha (44 y.o. [name] Primary Care Jeanclaude Wentworth: Fleet Contras Other Clinician: Referring Rolla Kedzierski: Treating Roxan Yamamoto/Extender: Burnetta Sabin in Treatment: 1 Vital Signs Height(in): 71 Pulse(bpm): 103 Weight(lbs): 335 Blood Pressure(mmHg): 145/87 Body Mass Index(BMI): 47 Temperature(F): 98.2 Respiratory  Rate(breaths/min): 20 Photos: [1:No Photos Right T Great oe] [N/A:N/A N/A] Wound Location: [1:Gradually Appeared] [N/A:N/A] Wounding Event: [1:Diabetic Wound/Ulcer of the Lower] [N/A:N/A] Primary Etiology: [1:Extremity Hypertension, Type II Diabetes] [N/A:N/A] Comorbid History: [1:03/24/2020] [N/A:N/A] Date Acquired: [1:1] [N/A:N/A] Weeks of Treatment: [1:Open] [N/A:N/A] Wound Status: [1:2.3x2.1x0.1] [N/A:N/A] Measurements L x W x D (cm) [1:3.793] [N/A:N/A] A (cm) : rea [1:0.379] [N/A:N/A] Volume (cm) : [1:8.50%] [N/A:N/A] % Reduction in A rea: [1:8.70%] [N/A:N/A] % Reduction in Volume: [1:Grade 2] [N/A:N/A] Classification: [1:Medium] [N/A:N/A] Exudate A mount: [1:Serosanguineous] [N/A:N/A] Exudate Type: [1:red, brown] [N/A:N/A] Exudate Color: [1:Distinct, outline attached] [N/A:N/A] Wound Margin: [1:Small (  1-33%)] [N/A:N/A] Granulation Amount: [1:Red, Hyper-granulation] [N/A:N/A] Granulation Quality: [1:Large (67-100%)] [N/A:N/A] Necrotic Amount: [1:Fat Layer (Subcutaneous Tissue)] [N/A:N/A] Exposed Structures: [1:Exposed: Yes Fascia: No Tendon: No Muscle: No Joint: No Bone: No None] [N/A:N/A] Epithelialization: [1:Debridement - Excisional] [N/A:N/A] Debridement: Pre-procedure Verification/Time Out 15:29 [N/A:N/A] Taken: [1:Lidocaine 5% topical ointment] [N/A:N/A] Pain Control: [1:Necrotic/Eschar, Subcutaneous,] [N/A:N/A] Tissue Debrided: [1:Slough Skin/Subcutaneous Tissue] [N/A:N/A] Level: [1:4.83] [N/A:N/A] Debridement A (sq cm): [1:rea Curette] [N/A:N/A] Instrument: [1:Swab] [N/A:N/A] Specimen: [1:1] [N/A:N/A] Number of Specimens Taken: [1:Minimum] [N/A:N/A] Bleeding: [1:Pressure] [N/A:N/A] Hemostasis Achieved: [1:0] [N/A:N/A] Procedural Pain: [1:0] [N/A:N/A] Post Procedural Pain: Debridement Treatment Response: Procedure was tolerated well [N/A:N/A] Post Debridement Measurements L x 2.3x2.1x0.1 [N/A:N/A] W x D (cm) [1:0.379] [N/A:N/A] Post Debridement Volume: (cm)  [1:Debridement] [N/A:N/A] Treatment Notes Wound #1 (Right Toe Great) 1. Cleanse With Wound Cleanser 3. Primary Dressing Applied Santyl 4. Secondary Dressing Dry Gauze Roll Gauze Foam 5. Secured With Medipore tape Notes foam donut as secondary. netting. foam and medipore applied to lateral foot for protection. Electronic Signature(s) Signed: 04/17/2020 6:28:13 PM By: Zandra Abts RN, BSN Signed: 04/17/2020 8:16:21 PM By: Baltazar Najjar MD Entered By: Baltazar Najjar on 04/17/2020 16:28:34 -------------------------------------------------------------------------------- Multi-Disciplinary Care Plan Details Patient Name: Date of Service: Augusto Garbe [facility] 04/17/2020 1:45 PM Medical Record Number: [id number] Patient Account Number: [id number] Date of Birth/Sex: Treating RN: [birth year] (44 y.o. [name] Primary Care Dezyre Hoefer: Fleet Contras Other Clinician: Referring Salvador Bigbee: Treating Gehrig Patras/Extender: Burnetta Sabin in Treatment: 1 Active Inactive Nutrition Nursing Diagnoses: Impaired glucose control: actual or potential Goals: Patient/caregiver verbalizes understanding of need to maintain therapeutic glucose control per primary care physician Date Initiated: 04/07/2020 Target Resolution Date: 05/05/2020 Goal Status: Active Interventions: Provide education on nutrition Treatment Activities: Education provided on Nutrition : 04/07/2020 Notes: Wound/Skin Impairment Nursing Diagnoses: Impaired tissue integrity Goals: Ulcer/skin breakdown will have a volume reduction of 30% by week 4 Date Initiated: 04/07/2020 Target Resolution Date: 05/05/2020 Goal Status: Active Interventions: Provide education on ulcer and skin care Notes: Electronic Signature(s) Signed: 04/17/2020 6:28:13 PM By: Zandra Abts RN, BSN Entered By: Zandra Abts on 04/17/2020 17:53:26 -------------------------------------------------------------------------------- Pain  Assessment Details Patient Name: Date of Service: Dwayne Rocha 04/17/2020 1:45 PM Medical Record Number: [id number] Patient Account Number: [id number] Date of Birth/Sex: Treating RN: Dwayne [birth year], Rocha (44 y.o. Katherina Right Primary Care Persephone Schriever: Fleet Contras Other Clinician: Referring Tyheem Boughner: Treating Charma Mocarski/Extender: Burnetta Sabin in Treatment: 1 Active Problems Location of Pain Severity and Description of Pain Patient Has Paino No Site Locations Pain Management and Medication Current Pain Management: Electronic Signature(s) Signed: 04/17/2020 5:35:09 PM By: Cherylin Mylar Entered By: Cherylin Mylar on 04/17/2020 14:47:26 -------------------------------------------------------------------------------- Patient/Caregiver Education Details Patient Name: Date of Service: Dwayne Rocha 6/7/2021andnbsp1:45 PM Medical Record Number: [id number] Patient Account Number: [id number] Date of Birth/Gender: Treating RN: [birth year]ocha (44 y.o. [name] Primary Care Physician: Fleet Contras Other Clinician: Referring Physician: Treating Physician/Extender: Burnetta Sabin in Treatment: 1 Education Assessment Education Provided To: Patient Education Topics Provided Wound/Skin Impairment: Methods: Explain/Verbal Responses: State content correctly Electronic Signature(s) Signed: 04/17/2020 6:28:13 PM By: Zandra Abts RN, BSN Entered By: Zandra Abts on 04/17/2020 17:53:35 -------------------------------------------------------------------------------- Wound Assessment Details Patient Name: Date of Service: Dwayne Rocha 04/17/2020 1:45 PM Medical Record Number: [id number] Patient Account Number: [id number] Date of Birth/Sex: Treating RN: [birth year] (44 y.o. Katherina Right Primary Care Trayveon Beckford: Fleet Contras Other Clinician: Referring Jatoria Kneeland: Treating Kail Fraley/Extender: Burnetta Sabin in Treatment: 1 Wound Status Wound  Number: 1 Primary Etiology: Diabetic Wound/Ulcer of the Lower Extremity Wound Location: Right T Great oe Wound Status: Open Wounding Event: Gradually Appeared Comorbid History: Hypertension, Type II Diabetes Date Acquired: 03/24/2020 Weeks Of Treatment: 1 Clustered Wound: No Wound Measurements Length: (cm) 2.3 Width: (cm) 2.1 Depth: (cm) 0.1 Area: (cm) 3.793 Volume: (cm) 0.379 % Reduction in Area: 8.5% % Reduction in Volume: 8.7% Epithelialization: None Tunneling: No Undermining: No Wound Description Classification: Grade 2 Wound Margin: Distinct, outline attached Exudate Amount: Medium Exudate Type: Serosanguineous Exudate Color: red, brown Foul Odor After Cleansing: No Slough/Fibrino Yes Wound Bed Granulation Amount: Small (1-33%) Exposed Structure Granulation Quality: Red, Hyper-granulation Fascia Exposed: No Necrotic Amount: Large (67-100%) Fat Layer (Subcutaneous Tissue) Exposed: Yes Necrotic Quality: Adherent Slough Tendon Exposed: No Muscle Exposed: No Joint Exposed: No Bone Exposed: No Treatment Notes Wound #1 (Right Toe Great) 1. Cleanse With Wound Cleanser 3. Primary Dressing Applied Santyl 4. Secondary Dressing Dry Gauze Roll Gauze Foam 5. Secured With Medipore tape Notes foam donut as secondary. netting. foam and medipore applied to lateral foot for protection. Electronic Signature(s) Signed: 04/17/2020 5:35:09 PM By: Kela Millin Entered By: Kela Millin on 04/17/2020 14:50:04 -------------------------------------------------------------------------------- Vitals Details Patient Name: Date of Service: Dwayne [facility], [facility] 04/17/2020 1:45 PM Medical Record Number: [id number] Patient Account Number: [id number] Date of Birth/Sex: Treating RN: [birth year]Rocha (44 y.o. Marvis Repress Primary Care Marc Leichter: [name] Other Clinician: Referring Kasi Lasky: Treating Rivan Siordia/Extender:  Lenard Galloway in Treatment: 1 Vital Signs Time Taken: 14:45 Temperature (F): 98.2 Height (in): 71 Pulse (bpm): 103 Weight (lbs): 335 Respiratory Rate (breaths/min): 20 Body Mass Index (BMI): 46.7 Blood Pressure (mmHg): 145/87 Reference Range: 80 - 120 mg / dl Electronic Signature(s) Signed: 04/17/2020 5:35:09 PM By: Kela Millin Entered By: Kela Millin on 04/17/2020 14:47:19

## 2020-04-20 LAB — AEROBIC CULTURE W GRAM STAIN (SUPERFICIAL SPECIMEN)

## 2020-04-23 ENCOUNTER — Ambulatory Visit (HOSPITAL_COMMUNITY)
Admission: RE | Admit: 2020-04-23 | Discharge: 2020-04-23 | Disposition: A | Discharge status: Home / Self Care | Payer: 59 | Source: Ambulatory Visit | Attending: Physician Assistant | Admitting: Physician Assistant

## 2020-04-23 DIAGNOSIS — L97519 Non-pressure chronic ulcer of other part of right foot with unspecified severity: Secondary | ICD-10-CM | POA: Diagnosis present

## 2020-04-23 MED ORDER — GADOBUTROL 1 MMOL/ML IV SOLN
10.0000 mL | Freq: Once | INTRAVENOUS | Status: AC | PRN
  Administered 2020-04-23: 10 mL via INTRAVENOUS

## 2020-04-24 ENCOUNTER — Other Ambulatory Visit: Payer: Self-pay

## 2020-04-24 ENCOUNTER — Encounter (HOSPITAL_BASED_OUTPATIENT_CLINIC_OR_DEPARTMENT_OTHER): Payer: 59 | Admitting: Physician Assistant

## 2020-04-24 DIAGNOSIS — E11621 Type 2 diabetes mellitus with foot ulcer: Secondary | ICD-10-CM | POA: Diagnosis not present

## 2020-04-24 NOTE — Progress Notes (Addendum)
Dwayne Rocha, Dwayne Rocha ([id number]) Visit Report for 04/24/2020 Chief Complaint Document Details Patient Name: Date of Service: Dwayne Rocha, Dwayne Rocha 04/24/2020 2:45 PM Medical Record Number: [id number] Patient Account Number: [id number] Date of Birth/Sex: Treating RN: Sep 25, 1976 (44 y.o. Dwayne Rocha Primary Care Provider: Nolene Rocha Other Clinician: Referring Provider: Treating Provider/Extender: Dwayne Rocha in Treatment: 2 Information Obtained from: Patient Chief Complaint Referred by PCP for right great toe wound x 2 weeks Electronic Signature(s) Signed: 04/24/2020 2:57:10 PM By: Dwayne Keeler PA-C Entered By: Dwayne Rocha on 04/24/2020 14:57:10 -------------------------------------------------------------------------------- Debridement Details Patient Name: Date of Service: Dwayne Rocha, Dwayne Rocha 04/24/2020 2:45 PM Medical Record Number: [id number] Patient Account Number: [id number] Date of Birth/Sex: Treating RN: 10-10-1976 (44 y.o. Dwayne Rocha Primary Care Provider: Nolene Rocha Other Clinician: Referring Provider: Treating Provider/Extender: Dwayne Rocha in Treatment: 2 Debridement Performed for Assessment: Wound #1 Right T Great oe Performed By: Physician Dwayne Keeler, PA Debridement Type: Debridement Severity of Tissue Pre Debridement: Fat layer exposed Level of Consciousness (Pre-procedure): Awake and Alert Pre-procedure Verification/Time Out Yes - 16:45 Taken: Start Time: 16:46 Pain Control: Lidocaine 4% T opical Solution T Area Debrided (L x W): otal 1.3 (cm) x 1.2 (cm) = 1.56 (cm) Tissue and other material debrided: Viable, Non-Viable, Slough, Subcutaneous, Skin: Epidermis, Slough Level: Skin/Subcutaneous Tissue Debridement Description: Excisional Instrument: Curette Bleeding: Minimum Hemostasis Achieved: Pressure End Time: 16:51 Procedural Pain: 0 Post Procedural Pain: 0 Response to Treatment:  Procedure was tolerated well Level of Consciousness (Post- Awake and Alert procedure): Post Debridement Measurements of Total Wound Length: (cm) 1.3 Width: (cm) 1.2 Depth: (cm) 0.5 Volume: (cm) 0.613 Character of Wound/Ulcer Post Debridement: Improved Severity of Tissue Post Debridement: Fat layer exposed Post Procedure Diagnosis Same as Pre-procedure Electronic Signature(s) Signed: 04/24/2020 5:38:02 PM By: Dwayne Gouty RN, BSN Signed: 04/24/2020 5:53:30 PM By: Dwayne Keeler PA-C Entered By: Dwayne Rocha on 04/24/2020 16:49:22 -------------------------------------------------------------------------------- HPI Details Patient Name: Date of Service: Dwayne Rocha, Dwayne Rocha 04/24/2020 2:45 PM Medical Record Number: [id number] Patient Account Number: [id number] Date of Birth/Sex: Treating RN: 28-Feb-1976 (44 y.o. Dwayne Rocha Primary Care Provider: Nolene Rocha Other Clinician: Referring Provider: Treating Provider/Extender: Dwayne Rocha in Treatment: 2 History of Present Illness HPI Description: 18 y o male with diabetes and diabetic neuropathy with right great toe blister that ruptured 2 weeks ago, he was in ED and received PO clindamycin and after that started on Keflex by PCP few days back He has no wound care other than bactoban to the area daily He gives no history of wounds He has T2DM and A1c listed as 5.8, he scored 2/10 on monofilament testing, ABI on right is 1.1 04/12/2020 upon evaluation today patient appears to be doing somewhat poorly in regard to his great toe ulcer on the right foot. He does have a blister as well on the lateral portion of his foot on the right. With that being said he has been using Sorbact that does not seem to have been beneficial at all in my opinion. I think he may actually benefit from [facility] I would recommend that and send that into the mail order pharmacy for him today. I also think that even though he had a  negative x-ray in the ER that he probably needs to have an appointment for an MRI to further evaluate for any signs of deeper infection at this point. The patient is in agreement with that plan  as well. 6/7; the patient has a completely nonviable surface to his right plantar first toe wound. He is using Santyl which he managed to obtain for a $50 co-pay. He has his MRI on the 13th. I have not seen his toe previously it looks somewhat swollen but not overtly so. 04/24/2020 upon evaluation today patient appears to be doing better as compared to last time I saw him fortunately. There does not appear to be any signs of active systemic infection at this time which is good news. No fever chills noted. With that being said he does still have evidence of infection of the toe and in fact he did have the MRI which shows that he has ulceration with a sinus tract extending to the first distal phalanx with osteomyelitis of the first distal phalanx no abscess noted. Fortunately this seems to be very limited to the distal portion of the toe at this point. Nonetheless I am still concerned about the osteomyelitis and the fact he has been on antibiotics, Keflex for quite some time before seeing me we now have had him on Levaquin as a result of his culture showing that he had Pseudomonas and Enterococcus. Fortunately the Levaquin covers both. Electronic Signature(s) Signed: 04/24/2020 5:47:54 PM By: Lenda Kelp PA-C Entered By: Lenda Kelp on 04/24/2020 17:47:54 -------------------------------------------------------------------------------- Physical Exam Details Patient Name: Date of Service: Dwayne Rocha 04/24/2020 2:45 PM Medical Record Number: [id number] Patient Account Number: [id number] Date of Birth/Sex: Treating RN: 01/09/1976 (44 y.o. Dwayne Rocha Primary Care Provider: Fleet Rocha Other Clinician: Referring Provider: Treating Provider/Extender: Dwayne Rocha in  Treatment: 2 Constitutional Well-nourished and well-hydrated in no acute distress. Respiratory normal breathing without difficulty. Psychiatric this patient is able to make decisions and demonstrates good insight into disease process. Alert and Oriented x 3. pleasant and cooperative. Notes Upon inspection patient's wound bed actually did show signs of improvement with the Santyl he did however have a little bit of necrotic debris that I was trying to loosen up a bit I did perform debridement in this case he tolerated that without pain post debridement wound bed appears to be doing better which is great news he still does not have a good viable surface all the way and there is no bone exposed so therefore I could not obtain a bone culture as of today. Electronic Signature(s) Signed: 04/24/2020 5:48:20 PM By: Lenda Kelp PA-C Entered By: Lenda Kelp on 04/24/2020 17:48:20 -------------------------------------------------------------------------------- Physician Orders Details Patient Name: Date of Service: Dwayne Rocha 04/24/2020 2:45 PM Medical Record Number: [id number] Patient Account Number: [id number] Date of Birth/Sex: Treating RN: 1975-11-13 (44 y.o. 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 Dressing Change Frequency Wound #1 Right T Great oe Change dressing every day. Wound Cleansing Wound #1 Right T Great oe May shower and wash wound with soap and water. - with dressing change Primary Wound Dressing Wound #1 Right T Great oe Santyl Ointment Secondary  Dressing Foam - to lateral foot for protection Wound #1 Right T Great oe Foam - foam donut Kerlix/Rolled Gauze Dry Gauze Edema Control Avoid standing for long periods of time Elevate  legs to the level of the heart or above for 30 minutes daily and/or when sitting, a frequency of: - throughout the day Off-Loading Open toe surgical shoe to: - right foot Other: - minimal weight bearing right foot Additional Orders / Instructions Other: - out of work for 1 month Consults Infectious Disease - osteomyelitis right great toe - (ICD10 L97.509 - 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) Patient Medications llergies: No Known Allergies A Notifications Medication Indication Start End prior to debridement 04/24/2020 lidocaine DOSE topical 4 % gel - gel topical 04/24/2020 Levaquin DOSE 1 - oral 500 mg tablet - 1 tablet oral taken 1 time per day for 14 days to add onto the initial prescription of 7 days or until ID appointment Electronic Signature(s) Signed: 04/24/2020 5:53:08 PM By: Lenda Kelp PA-C Previous Signature: 04/24/2020 5:38:02 PM Version By: Zenaida Deed RN, BSN Entered By: Lenda Kelp on 04/24/2020 17:53:08 -------------------------------------------------------------------------------- Problem List Details Patient Name: Date of Service: Dwayne Rocha 04/24/2020 2:45 PM Medical Record Number: [id number] Patient Account Number: [id number] Date of Birth/Sex: Treating RN: 04-05-1976 (44 y.o. 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 with unspecified 04/07/2020 No Yes severity E11.621 Type 2 diabetes mellitus with foot ulcer 04/07/2020 No  Yes M86.471 Chronic osteomyelitis with draining sinus, right ankle and foot 04/24/2020 No Yes Inactive Problems Resolved Problems Electronic Signature(s) Signed: 04/24/2020 5:51:01 PM By: Lenda Kelp PA-C Previous Signature: 04/24/2020 2:57:06 PM Version By: Lenda Kelp PA-C Entered By: Lenda Kelp on 04/24/2020 17:51:01 -------------------------------------------------------------------------------- Progress Note Details Patient Name: Date of Service: Dwayne Rocha 04/24/2020 2:45 PM Medical Record Number: [id number] Patient Account Number: [id number] Date of Birth/Sex: Treating RN: 12-13-1975 (44 y.o. Dwayne Rocha Primary Care Provider: Fleet Rocha Other Clinician: Referring Provider: Treating Provider/Extender: Dwayne Rocha in Treatment: 2 Subjective Chief Complaint Information obtained from Patient Referred by PCP for right great toe wound x 2 weeks History of Present Illness (HPI) 20 y o male with diabetes and diabetic neuropathy with right great toe blister that ruptured 2 weeks ago, he was in ED and received PO clindamycin and after that started on Keflex by PCP few days back He has no wound care other than bactoban to the area daily He gives no history of wounds He has T2DM and A1c listed as 5.8, he scored 2/10 on monofilament testing, ABI on right is 1.1 04/12/2020 upon evaluation today patient appears to be doing somewhat poorly in regard to his great toe ulcer on the right foot. He does have a blister as well on the lateral portion of his foot on the right. With that being said he has been using Sorbact that does not seem to have been beneficial at all in my opinion. I think he may actually benefit from [facility] I would recommend that and send that into the mail order pharmacy for him today. I also think that even though he had a negative x-ray in the ER that he probably needs to have an appointment for an MRI to further evaluate for  any signs of deeper infection at this point. The patient is in agreement with that plan as well. 6/7; the patient has a completely nonviable surface to his right  plantar first toe wound. He is using Santyl which he managed to obtain for a $50 co-pay. He has his MRI on the 13th. I have not seen his toe previously it looks somewhat swollen but not overtly so. 04/24/2020 upon evaluation today patient appears to be doing better as compared to last time I saw him fortunately. There does not appear to be any signs of active systemic infection at this time which is good news. No fever chills noted. With that being said he does still have evidence of infection of the toe and in fact he did have the MRI which shows that he has ulceration with a sinus tract extending to the first distal phalanx with osteomyelitis of the first distal phalanx no abscess noted. Fortunately this seems to be very limited to the distal portion of the toe at this point. Nonetheless I am still concerned about the osteomyelitis and the fact he has been on antibiotics, Keflex for quite some time before seeing me we now have had him on Levaquin as a result of his culture showing that he had Pseudomonas and Enterococcus. Fortunately the Levaquin covers both. Objective Constitutional Well-nourished and well-hydrated in no acute distress. Vitals Time Taken: 4:16 PM, Height: 71 in, Weight: 335 lbs, BMI: 46.7, Temperature: 98.5 F, Pulse: 93 bpm, Respiratory Rate: 20 breaths/min, Blood Pressure: 122/79 mmHg. Respiratory normal breathing without difficulty. Psychiatric this patient is able to make decisions and demonstrates good insight into disease process. Alert and Oriented x 3. pleasant and cooperative. General Notes: Upon inspection patient's wound bed actually did show signs of improvement with the Santyl he did however have a little bit of necrotic debris that I was trying to loosen up a bit I did perform debridement in this case he  tolerated that without pain post debridement wound bed appears to be doing better which is great news he still does not have a good viable surface all the way and there is no bone exposed so therefore I could not obtain a bone culture as of today. Integumentary (Hair, Skin) Wound #1 status is Open. Original cause of wound was Gradually Appeared. The wound is located on the Right T Great. The wound measures 1.3cm length x oe 1.2cm width x 0.5cm depth; 1.225cm^2 area and 0.613cm^3 volume. There is Fat Layer (Subcutaneous Tissue) Exposed exposed. There is no tunneling or undermining noted. There is a medium amount of serosanguineous drainage noted. The wound margin is distinct with the outline attached to the wound base. There is small (1-33%) red, hyper - granulation within the wound bed. There is a large (67-100%) amount of necrotic tissue within the wound bed including Adherent Slough. 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 There was a Excisional Skin/Subcutaneous Tissue Debridement with a total area of 1.56 sq cm performed by Lenda Kelp, PA. With the following instrument(s): Curette to remove Viable and Non-Viable tissue/material. Material removed includes Subcutaneous Tissue, Slough, and Skin: Epidermis after achieving pain control using Lidocaine 4% Topical Solution. No specimens were taken. A time out was conducted at 16:45, prior to the start of the procedure. A Minimum amount of bleeding was controlled with Pressure. The procedure was  tolerated well with a pain level of 0 throughout and a pain level of  0 following the procedure. Post Debridement Measurements: 1.3cm length x 1.2cm width x 0.5cm depth; 0.613cm^3 volume. Character of Wound/Ulcer Post Debridement is improved. Severity of Tissue Post Debridement is: Fat layer exposed. Post procedure Diagnosis Wound #1: Same as Pre-Procedure Plan Follow-up Appointments: Return Appointment in 1 week. Dressing Change Frequency: Wound #1 Right T Great: oe Change dressing every day. Wound Cleansing: Wound #1 Right T Great: oe May shower and wash wound with soap and water. - with dressing change Primary Wound Dressing: Wound #1 Right T Great: oe Santyl Ointment Secondary Dressing: Foam - to lateral foot for protection Wound #1 Right T Great: oe Foam - foam donut Kerlix/Rolled Gauze Dry Gauze Edema Control: Avoid standing for long periods of time Elevate legs to the level of the heart or above for 30 minutes daily and/or when sitting, a frequency of: - throughout the day Off-Loading: Open toe surgical shoe to: - right foot Other: - minimal weight bearing right foot Additional Orders / Instructions: Other: - out of work for 1 month Consults ordered were: Infectious Disease - osteomyelitis right great toe The following medication(s) was prescribed: lidocaine topical 4 % gel gel topical for prior to debridement was prescribed at facility Levaquin oral 500 mg tablet 1 1 tablet oral taken 1 time per day for 14 days to add onto the initial prescription of 7 days or until ID appointment starting 04/24/2020 1. I did have a lengthy conversation with the patient today concerning what is going on right now with his wound. I do believe that he is a candidate for hyperbaric oxygen therapy. He has been on oral antibiotics now for about a month. He was on Keflex initially and then subsequently we have switched him to Levaquin which she has most recently been on. I am going to continue the Levaquin for the time being and also make a referral to  infectious disease. Nonetheless I explained how hyperbaric oxygen therapy could be beneficial for him as far as trying to improve the overall oxygenation of the wound and thereby also help with more appropriate and rapid healing. The patient voiced understanding although this is something he is definitely getting consider. 2. Again we are making referral to infectious disease for further evaluation and for consideration of the possibility of IV antibiotic therapy. 3. For the time being I am can also suggest that he continue out of work and with the postop shoe to keep pressure off of the toe I think this is still optimal. We will see patient back for reevaluation in 1 week here in the clinic. If anything worsens or changes patient will contact our office for additional recommendations. The patient is going to consider his options and see if he would like to proceed with the hyperbarics for now we are making a referral to infectious disease. His other option would be to potentially see surgery for a surgical option/amputation. With that being said he does not seem to want to proceed that route but I will see what he has to say next week when we see him back. Electronic Signature(s) Signed: 04/27/2020 4:27:52 PM By: Lenda KelpStone III, Minh Roanhorse PA-C Signed: 04/27/2020 5:22:52 PM By: Zenaida DeedBoehlein, Linda RN, BSN Previous Signature: 04/24/2020 5:53:18 PM Version By: Lenda KelpStone III, Jadin Creque PA-C Previous Signature: 04/24/2020 5:51:19 PM Version By: Lenda KelpStone III, Salome Cozby PA-C Previous Signature: 04/24/2020 5:50:04 PM Version By: Lenda KelpStone III, Metzli Pollick PA-C Entered By: Zenaida DeedBoehlein, Linda on  04/27/2020 14:17:24 -------------------------------------------------------------------------------- SuperBill Details Patient Name: Date of Service: Dwayne Rocha 04/24/2020 Medical Record Number: [id number] Patient Account Number: [id number] Date of Birth/Sex: Treating RN: 11-27-1975 (44 y.o. 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 with unspecified Modifier: severity Quantity: 1 Physician Procedures : CPT4 Code Description Modifier [id number] - WC PHYS LEVEL 4 - EST PT 25 ICD-10 Diagnosis Description 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

## 2020-04-27 NOTE — Progress Notes (Addendum)
THELMER, LEGLER ([id number]) Visit Report for 04/24/2020 Arrival Information Details Patient Name: Date of Service: [place], [place] 04/24/2020 2:45 PM Medical Record Number: [id number] Patient Account Number: [id number] Date of Birth/Sex: Treating RN: [birth year] (44 y.o. [name] Primary Care Yoselyn Mcglade: [name] Other Clinician: Referring Shataria Crist: Treating Crystallee Werden/Extender: [name] in Treatment: 2 Visit Information History Since Last Visit Added or deleted any medications: No Patient Arrived: Ambulatory Any new allergies or adverse reactions: No Arrival Time: 16:16 Had a fall or experienced change in No Accompanied By: self activities of daily living that may affect Transfer Assistance: None risk of falls: Patient Identification Verified: Yes Signs or symptoms of abuse/neglect since last visito No Secondary Verification Process Completed: Yes Hospitalized since last visit: No Patient Requires Transmission-Based Precautions: No Implantable device outside of the clinic excluding No Patient Has Alerts: No cellular tissue based products placed in the center since last visit: Has Dressing in Place as Prescribed: Yes Pain Present Now: No Electronic Signature(s) Signed: 04/27/2020 12:45:44 PM By: Sandre Kitty Entered By: Sandre Kitty on 04/24/2020 16:16:35 -------------------------------------------------------------------------------- Encounter Discharge Information Details Patient Name: Date of Service: Jamelle Haring, [place] 04/24/2020 2:45 PM Medical Record Number: [id number] Patient Account Number: [id number] Date of Birth/Sex: Treating RN: 03-30-1976 (44 y.o. Marvis Repress Primary Care Charlette Hennings: [name] Other Clinician: Referring Esdras Delair: Treating Clarie Camey/Extender: [name] in Treatment: 2 Encounter Discharge Information Items Post Procedure Vitals Discharge Condition:  Stable Temperature (F): 98.5 Ambulatory Status: Ambulatory Pulse (bpm): 93 Discharge Destination: Home Respiratory Rate (breaths/min): 20 Transportation: Private Auto Blood Pressure (mmHg): 122/79 Accompanied By: self Schedule Follow-up Appointment: Yes Clinical Summary of Care: Patient Declined Electronic Signature(s) Signed: 04/24/2020 5:37:40 PM By: Kela Millin Entered By: Kela Millin on 04/24/2020 17:27:06 -------------------------------------------------------------------------------- Lower Extremity Assessment Details Patient Name: Date of Service: Fabio Pierce 04/24/2020 2:45 PM Medical Record Number: [id number] Patient Account Number: [id number] Date of Birth/Sex: Treating RN: 08-25-1976 (44 y.o. Oval Linsey Primary Care Sigmund Morera: [name] Other Clinician: Referring Jeanie Mccard: Treating Hiedi Touchton/Extender: [name] in Treatment: 2 Edema Assessment Assessed: [Left: No] [Right: No] E[Left: dema] [Right: :] Calf Left: Right: Point of Measurement: 47 cm From Medial Instep cm 43 cm Ankle Left: Right: Point of Measurement: 12 cm From Medial Instep cm 23 cm Electronic Signature(s) Signed: 04/26/2020 9:54:21 AM By: [name] RN Entered By: [name] on 04/24/2020 16:31:39 -------------------------------------------------------------------------------- [place] Details Patient Name: Date of Service: Jamelle Haring, [place] 04/24/2020 2:45 PM Medical Record Number: [id number] Patient Account Number: [id number] Date of Birth/Sex: Treating RN: 12-17-1975 (44 y.o. [name] Primary Care Meleane Selinger: [name] Other Clinician: Referring Miabella Shannahan: Treating Linwood Gullikson/Extender: [name] in Treatment: 2 Active Inactive Nutrition Nursing Diagnoses: Impaired glucose control: actual or potential Goals: Patient/caregiver verbalizes understanding of need to maintain  therapeutic glucose control per primary care physician Date Initiated: 04/07/2020 Target Resolution Date: 05/05/2020 Goal Status: Active Interventions: Provide education on nutrition Treatment Activities: Education provided on Nutrition : 04/07/2020 Notes: Wound/Skin Impairment Nursing Diagnoses: Impaired tissue integrity Goals: Ulcer/skin breakdown will have a volume reduction of 30% by week 4 Date Initiated: 04/07/2020 Target Resolution Date: 05/05/2020 Goal Status: Active Interventions: Provide education on ulcer and skin care Notes: Electronic Signature(s) Signed: 04/24/2020 5:38:02 PM By: Baruch Gouty RN, BSN Entered By: Baruch Gouty on 04/24/2020 16:43:28 -------------------------------------------------------------------------------- Pain Assessment Details Patient Name: Date of Service: Jamelle Haring, [place] 04/24/2020 2:45 PM Medical  Record Number: [id number] Patient Account Number: [id number] Date of Birth/Sex: Treating RN: [birth year] (44 y.o. Damaris Schooner Primary Care Yarelin Reichardt: Fleet Contras Other Clinician: Referring Lokelani Lutes: Treating Sharrie Self/Extender: Gilford Silvius in Treatment: 2 Active Problems Location of Pain Severity and Description of Pain Patient Has Paino No Site Locations Pain Management and Medication Current Pain Management: Electronic Signature(s) Signed: 04/24/2020 5:38:02 PM By: Zenaida Deed RN, BSN Signed: 04/27/2020 12:45:44 PM By: Karl Ito Entered By: Karl Ito on 04/24/2020 16:16:53 -------------------------------------------------------------------------------- Patient/Caregiver Education Details Patient Name: Date of Service: [name] 6/14/2021andnbsp2:45 PM Medical Record Number: [id number] Patient Account Number: [id number] Date of Birth/Gender: Treating RN: Sep 12, 1976 (44 y.o. Damaris Schooner Primary Care Physician: Fleet Contras Other Clinician: Referring  Physician: Treating Physician/Extender: Gilford Silvius in Treatment: 2 Education Assessment Education Provided To: Patient Education Topics Provided Infection: Methods: Explain/Verbal Responses: Reinforcements needed, State content correctly Wound/Skin Impairment: Methods: Explain/Verbal Responses: Reinforcements needed, State content correctly Electronic Signature(s) Signed: 04/24/2020 5:38:02 PM By: Zenaida Deed RN, BSN Entered By: Zenaida Deed on 04/24/2020 16:44:25 -------------------------------------------------------------------------------- Wound Assessment Details Patient Name: Date of Service: [name] 04/24/2020 2:45 PM Medical Record Number: [id number] Patient Account Number: [id number] Date of Birth/Sex: Treating RN: [birth year] (44 y.o. Damaris Schooner Primary Care Azeneth Carbonell: Fleet Contras Other Clinician: Referring Alize Borrayo: Treating Jasmarie Coppock/Extender: Gilford Silvius in Treatment: 2 Wound Status Wound Number: 1 Primary Etiology: Diabetic Wound/Ulcer of the Lower Extremity Wound Location: Right T Great oe Wound Status: Open Wounding Event: Gradually Appeared Comorbid History: Hypertension, Type II Diabetes Date Acquired: 03/24/2020 Weeks Of Treatment: 2 Clustered Wound: No Photos Wound Measurements Length: (cm) 1.3 Width: (cm) 1.2 Depth: (cm) 0.5 Area: (cm) 1.225 Volume: (cm) 0.613 % Reduction in Area: 70.5% % Reduction in Volume: -47.7% Epithelialization: None Tunneling: No Undermining: No Wound Description Classification: Grade 3 Wagner Verification: MRI Wound Margin: Distinct, outline attached Exudate Amount: Medium Exudate Type: Serosanguineous Exudate Color: red, brown Foul Odor After Cleansing: No Slough/Fibrino Yes Wound Bed Granulation Amount: Small (1-33%) Exposed Structure Granulation Quality: Red, Hyper-granulation Fascia Exposed: No Necrotic Amount: Large  (67-100%) Fat Layer (Subcutaneous Tissue) Exposed: Yes Necrotic Quality: Adherent Slough Tendon Exposed: No Muscle Exposed: No Joint Exposed: No Bone Exposed: No Treatment Notes Wound #1 (Right Toe Great) 1. Cleanse With Wound Cleanser 3. Primary Dressing Applied Calcium Alginate Ag 4. Secondary Dressing Dry Gauze Roll Gauze Foam 5. Secured With Tape Notes foam donut as secondary. netting. foam and medipore applied to lateral foot for protection. Electronic Signature(s) Signed: 04/27/2020 5:22:52 PM By: Zenaida Deed RN, BSN Previous Signature: 04/24/2020 5:38:02 PM Version By: Zenaida Deed RN, BSN Previous Signature: 04/26/2020 9:54:21 AM Version By: Yevonne Pax RN Entered By: Zenaida Deed on 04/27/2020 14:16:53 -------------------------------------------------------------------------------- Vitals Details Patient Name: Date of Service: Augusto Garbe, [facility] Ed M 04/24/2020 2:45 PM Medical Record Number: [id number] Patient Account Number: [id number] Date of Birth/Sex: Treating RN: 1976-07-06 (44 y.o. Damaris Schooner Primary Care Maleiya Pergola: Fleet Contras Other Clinician: Referring Sparkle Aube: Treating Ellarae Nevitt/Extender: Gilford Silvius in Treatment: 2 Vital Signs Time Taken: 16:16 Temperature (F): 98.5 Height (in): 71 Pulse (bpm): 93 Weight (lbs): 335 Respiratory Rate (breaths/min): 20 Body Mass Index (BMI): 46.7 Blood Pressure (mmHg): 122/79 Reference Range: 80 - 120 mg / dl Electronic Signature(s) Signed: 04/27/2020 12:45:44 PM By: Karl Ito Entered By: Karl Ito on 04/24/2020 16:16:47

## 2020-04-28 ENCOUNTER — Encounter (HOSPITAL_BASED_OUTPATIENT_CLINIC_OR_DEPARTMENT_OTHER): Payer: 59 | Admitting: Internal Medicine

## 2020-05-01 ENCOUNTER — Encounter (HOSPITAL_BASED_OUTPATIENT_CLINIC_OR_DEPARTMENT_OTHER): Payer: 59 | Admitting: Internal Medicine

## 2020-05-01 DIAGNOSIS — E11621 Type 2 diabetes mellitus with foot ulcer: Secondary | ICD-10-CM | POA: Diagnosis not present

## 2020-05-02 NOTE — Progress Notes (Addendum)
LUISALBERTO, BEEGLE ([id number]) Visit Report for 05/01/2020 Arrival Information Details Patient Name: Date of Service: [place], [place] 05/01/2020 8:15 A M Medical Record Number: [id number] Patient Account Number: [id number] Date of Birth/Sex: Treating RN: February 10, 1976 (44 y.o. Lorette Ang, [place]eding Primary Care Dim Meisinger: [name] Other Clinician: Referring Bill Yohn: Treating Darrelyn Morro/Extender: Lenard Galloway in Treatment: 3 Visit Information History Since Last Visit Added or deleted any medications: No Patient Arrived: Ambulatory Any new allergies or adverse reactions: No Arrival Time: 08:40 Had a fall or experienced change in No Accompanied By: self activities of daily living that may affect Transfer Assistance: None risk of falls: Patient Identification Verified: Yes Signs or symptoms of abuse/neglect since last visito No Secondary Verification Process Completed: Yes Hospitalized since last visit: No Patient Requires Transmission-Based Precautions: No Implantable device outside of the clinic excluding No Patient Has Alerts: No cellular tissue based products placed in the center since last visit: Has Dressing in Place as Prescribed: Yes Pain Present Now: No Electronic Signature(s) Signed: 05/01/2020 6:25:00 PM By: Deon Pilling Entered By: Deon Pilling on 05/01/2020 10:12:45 -------------------------------------------------------------------------------- Encounter Discharge Information Details Patient Name: Date of Service: Florestine Avers M 05/01/2020 8:15 A M Medical Record Number: [id number] Patient Account Number: [id number] Date of Birth/Sex: Treating RN: 08-09-1976 (44 y.o. Hessie Diener Primary Care Dara Camargo: [name] Other Clinician: Referring Lasheena Frieze: Treating Kaydin Labo/Extender: Lenard Galloway in Treatment: 3 Encounter Discharge Information Items Post Procedure Vitals Discharge Condition: Stable Temperature (F):  98.8 Ambulatory Status: Ambulatory Pulse (bpm): 90 Discharge Destination: Home Respiratory Rate (breaths/min): 18 Transportation: Private Auto Blood Pressure (mmHg): 129/78 Accompanied By: self Schedule Follow-up Appointment: Yes Clinical Summary of Care: Electronic Signature(s) Signed: 05/01/2020 6:25:00 PM By: Deon Pilling Entered By: Deon Pilling on 05/01/2020 18:07:06 -------------------------------------------------------------------------------- Lower Extremity Assessment Details Patient Name: Date of Service: Fabio Pierce 05/01/2020 8:15 A M Medical Record Number: [id number] Patient Account Number: [id number] Date of Birth/Sex: Treating RN: May 08, 1976 (44 y.o. Hessie Diener Primary Care Brylie Sneath: [name] Other Clinician: Referring Alekxander Isola: Treating Dosha Broshears/Extender: Lenard Galloway in Treatment: 3 Edema Assessment Assessed: Shirlyn Goltz: No] Patrice Paradise: Yes] Edema: [Left: Ye] [Right: s] Calf Left: Right: Point of Measurement: 47 cm From Medial Instep cm 43 cm Ankle Left: Right: Point of Measurement: 12 cm From Medial Instep cm 24 cm Vascular Assessment Pulses: Dorsalis Pedis Palpable: [Right:Yes] Electronic Signature(s) Signed: 05/01/2020 6:25:00 PM By: Deon Pilling Entered By: Deon Pilling on 05/01/2020 10:13:16 -------------------------------------------------------------------------------- [place] Details Patient Name: Date of Service: Jamelle Haring [place] 05/01/2020 8:15 A M Medical Record Number: [id number] Patient Account Number: [id number] Date of Birth/Sex: Treating RN: Aug 01, 1976 (44 y.o. Janyth Contes Primary Care Jermany Sundell: [name] Other Clinician: Referring Brier Reid: Treating Dreya Buhrman/Extender: Lenard Galloway in Treatment: 3 Active Inactive Wound/Skin Impairment Nursing Diagnoses: Impaired tissue integrity Goals: Patient/caregiver will verbalize understanding of  skin care regimen Date Initiated: 05/01/2020 Target Resolution Date: 05/12/2020 Goal Status: Active Ulcer/skin breakdown will have a volume reduction of 30% by week 4 Date Initiated: 04/07/2020 Target Resolution Date: 05/12/2020 Goal Status: Active Interventions: Provide education on ulcer and skin care Notes: Electronic Signature(s) Signed: 05/02/2020 5:24:50 PM By: Levan Hurst RN, BSN Entered By: Levan Hurst on 05/01/2020 16:33:24 -------------------------------------------------------------------------------- Pain Assessment Details Patient Name: Date of Service: Fabio Pierce 05/01/2020 8:15 A M Medical Record Number: [id number] Patient Account Number: [id number] Date of Birth/Sex: Treating RN: 1976/10/08 (44 y.o. Hessie Diener Primary Care Katrenia Alkins: [name] Other  Clinician: Referring Beverlyann Broxterman: Treating Shaelyn Decarli/Extender: Burnetta Sabin in Treatment: 3 Active Problems Location of Pain Severity and Description of Pain Patient Has Paino No Site Locations Rate the pain. Current Pain Level: 0 Pain Management and Medication Current Pain Management: Medication: No Cold Application: No Rest: No Massage: No Activity: No T.E.N.S.: No Heat Application: No Leg drop or elevation: No Is the Current Pain Management Adequate: Adequate How does your wound impact your activities of daily livingo Sleep: No Bathing: No Appetite: No Relationship With Others: No Bladder Continence: No Emotions: No Bowel Continence: No Work: No Toileting: No Drive: No Dressing: No Hobbies: No Electronic Signature(s) Signed: 05/01/2020 6:25:00 PM By: Shawn Stall Entered By: Shawn Stall on 05/01/2020 10:13:08 -------------------------------------------------------------------------------- Patient/Caregiver Education Details Patient Name: Date of Service: [name] 6/21/2021andnbsp8:15 A M Medical Record Number: [id number] Patient Account  Number: [id number] Date of Birth/Gender: Treating RN: Oct 26, 1976 (44 y.o. [name] Primary Care Physician: Fleet Contras Other Clinician: Referring Physician: Treating Physician/Extender: Burnetta Sabin in Treatment: 3 Education Assessment Education Provided To: Patient Education Topics Provided Wound/Skin Impairment: Methods: Explain/Verbal Responses: State content correctly Electronic Signature(s) Signed: 05/02/2020 5:24:50 PM By: Zandra Abts RN, BSN Entered By: Zandra Abts on 05/01/2020 16:33:36 -------------------------------------------------------------------------------- Wound Assessment Details Patient Name: Date of Service: [name] 05/01/2020 8:15 A M Medical Record Number: [id number] Patient Account Number: [id number] Date of Birth/Sex: Treating RN: 08/10/1976 (44 y.o. Tammy Sours Primary Care Jonni Oelkers: Fleet Contras Other Clinician: Referring Yasemin Rabon: Treating Latoy Labriola/Extender: Burnetta Sabin in Treatment: 3 Wound Status Wound Number: 1 Primary Etiology: Diabetic Wound/Ulcer of the Lower Extremity Wound Location: Right T Great oe Wound Status: Open Wounding Event: Gradually Appeared Comorbid History: Hypertension, Type II Diabetes Date Acquired: 03/24/2020 Weeks Of Treatment: 3 Clustered Wound: No Photos Wound Measurements Length: (cm) 1.2 Width: (cm) 1 Depth: (cm) 0.1 Area: (cm) 0.942 Volume: (cm) 0.094 % Reduction in Area: 77.3% % Reduction in Volume: 77.3% Epithelialization: None Tunneling: No Undermining: No Wound Description Classification: Grade 3 Wound Margin: Distinct, outline attached Exudate Amount: Medium Exudate Type: Serosanguineous Exudate Color: red, brown Foul Odor After Cleansing: No Slough/Fibrino Yes Wound Bed Granulation Amount: Medium (34-66%) Exposed Structure Granulation Quality: Red, Hyper-granulation Fascia Exposed: No Necrotic Amount: Medium  (34-66%) Fat Layer (Subcutaneous Tissue) Exposed: Yes Necrotic Quality: Adherent Slough Tendon Exposed: No Muscle Exposed: No Joint Exposed: No Bone Exposed: No Treatment Notes Wound #1 (Right Toe Great) 1. Cleanse With Wound Cleanser 3. Primary Dressing Applied Santyl 4. Secondary Dressing Dry Gauze Roll Gauze 5. Secured With Medipore tape Notes foam donut as secondary. netting. bordered foam applied to lateral foot for protection. Electronic Signature(s) Signed: 05/03/2020 4:53:33 PM By: Shawn Stall Signed: 05/03/2020 5:20:20 PM By: Marlinda Mike Previous Signature: 05/01/2020 6:25:00 PM Version By: Shawn Stall Entered By: Marlinda Mike on 05/03/2020 16:04:16 -------------------------------------------------------------------------------- Vitals Details Patient Name: Date of Service: [place], [facility] 05/01/2020 8:15 A M Medical Record Number: [id number] Patient Account Number: [id number] Date of Birth/Sex: Treating RN: [birth year] (44 y.o. Tammy Sours Primary Care Nolene Rocks: Fleet Contras Other Clinician: Referring Takasha Vetere: Treating Shan Valdes/Extender: Burnetta Sabin in Treatment: 3 Vital Signs Time Taken: 08:40 Temperature (F): 98.8 Height (in): 71 Pulse (bpm): 90 Weight (lbs): 335 Respiratory Rate (breaths/min): 18 Body Mass Index (BMI): 46.7 Blood Pressure (mmHg): 129/78 Reference Range: 80 - 120 mg / dl Electronic Signature(s) Signed: 05/01/2020 6:25:00 PM By: Shawn Stall Entered By: Shawn Stall on 05/01/2020 10:12:59

## 2020-05-02 NOTE — Progress Notes (Signed)
Dwayne Rocha, Dwayne Rocha ([id number]) Visit Report for 05/01/2020 Debridement Details Patient Name: Date of Service: [name] 05/01/2020 8:15 [name] Medical Record Number: [id number] Patient Account Number: [id number] Date of Birth/Sex: Treating RN: 1975/11/24 (44 y.o. Dwayne Rocha Primary Care Provider: Fleet Rocha Other Clinician: Referring Provider: Treating Provider/Extender: Dwayne Rocha in Treatment: 3 Debridement Performed for Assessment: Wound #1 Right T Great oe Performed By: Physician Dwayne Caul., MD Debridement Type: Debridement Severity of Tissue Pre Debridement: Fat layer exposed Level of Consciousness (Pre-procedure): Awake and Alert Pre-procedure Verification/Time Out Yes - 09:00 Taken: Start Time: 09:00 T Area Debrided (L x W): otal 1.2 (cm) x 1 (cm) = 1.2 (cm) Tissue and other material debrided: Non-Viable, Slough, Slough Level: Non-Viable Tissue Debridement Description: Selective/Open Wound Instrument: Curette Bleeding: Minimum Hemostasis Achieved: Pressure End Time: 09:01 Procedural Pain: 0 Post Procedural Pain: 0 Response to Treatment: Procedure was tolerated well Level of Consciousness (Post- Awake and Alert procedure): Post Debridement Measurements of Total Wound Length: (cm) 1.2 Width: (cm) 1 Depth: (cm) 0.1 Volume: (cm) 0.094 Character of Wound/Ulcer Post Debridement: Improved Severity of Tissue Post Debridement: Fat layer exposed Post Procedure Diagnosis Same as Pre-procedure Electronic Signature(s) Signed: 05/01/2020 6:03:41 PM By: Dwayne Najjar MD Signed: 05/02/2020 5:24:50 PM By: Dwayne Abts RN, BSN Entered By: Dwayne Rocha on 05/01/2020 17:05:48 -------------------------------------------------------------------------------- HPI Details Patient Name: Date of Service: Dwayne Rocha 05/01/2020 8:15 [name] Medical Record Number: [id number] Patient Account Number: [id number] Date of Birth/Sex:  Treating RN: February 08, 1976 (44 y.o. Dwayne Rocha Primary Care Provider: Fleet Rocha Other Clinician: Referring Provider: Treating Provider/Extender: Dwayne Rocha in Treatment: 3 History of Present Illness HPI Description: 40 y o male with diabetes and diabetic neuropathy with right great toe blister that ruptured 2 weeks ago, he was in ED and received PO clindamycin and after that started on Keflex by PCP few days back He has no wound care other than bactoban to the area daily He gives no history of wounds He has T2DM and A1c listed as 5.8, he scored 2/10 on monofilament testing, ABI on right is 1.1 04/12/2020 upon evaluation today patient appears to be doing somewhat poorly in regard to his great toe ulcer on the right foot. He does have a blister as well on the lateral portion of his foot on the right. With that being said he has been using Sorbact that does not seem to have been beneficial at all in my opinion. I think he may actually benefit from [facility] I would recommend that and send that into the mail order pharmacy for him today. I also think that even though he had a negative x-ray in the ER that he probably needs to have an appointment for an MRI to further evaluate for any signs of deeper infection at this point. The patient is in agreement with that plan as well. 6/7; the patient has a completely nonviable surface to his right plantar first toe wound. He is using Santyl which he managed to obtain for a $50 co-pay. He has his MRI on the 13th. I have not seen his toe previously it looks somewhat swollen but not overtly so. 04/24/2020 upon evaluation today patient appears to be doing better as compared to last time I saw him fortunately. There does not appear to be any signs of active systemic infection at this time which is good news. No fever chills noted. With that being said he does still have evidence of  infection of the toe and in fact he did have the MRI  which shows that he has ulceration with a sinus tract extending to the first distal phalanx with osteomyelitis of the first distal phalanx no abscess noted. Fortunately this seems to be very limited to the distal portion of the toe at this point. Nonetheless I am still concerned about the osteomyelitis and the fact he has been on antibiotics, Keflex for quite some time before seeing me we now have had him on Levaquin as a result of his culture showing that he had Pseudomonas and Enterococcus. Fortunately the Levaquin covers both. 6/21; the patient appears to be doing quite well at the wound smaller although I understand his MRI was positive for osteomyelitis. He remains on Levaquin and has an appointment with Dr. Drucilla Rocha of infectious disease on 6/23. Electronic Signature(s) Signed: 05/01/2020 6:03:41 PM By: Dwayne Ham MD Entered By: Dwayne Rocha on 05/01/2020 09:04:29 -------------------------------------------------------------------------------- Physical Exam Details Patient Name: Date of Service: Dwayne Rocha 05/01/2020 8:15 [name] Medical Record Number: [id number] Patient Account Number: [id number] Date of Birth/Sex: Treating RN: [birth year] (44 y.o. Dwayne Rocha Primary Care Provider: Nolene Rocha Other Clinician: Referring Provider: Treating Provider/Extender: Dwayne Rocha in Treatment: 3 Notes wound exam; still adherent debris on the surface of the wound I removed with a #3 curet. Most of this is adherent slough. Hemostasis with direct pressure. There is no exposed bone. This is not a type of wound that you can get a bone specimen Electronic Signature(s) Signed: 05/01/2020 6:03:41 PM By: Dwayne Ham MD Entered By: Dwayne Rocha on 05/01/2020 09:05:20 -------------------------------------------------------------------------------- Physician Orders Details Patient Name: Date of Service: Dwayne Rocha 05/01/2020 8:15 [name] Medical Record  Number: [id number] Patient Account Number: [id number] Date of Birth/Sex: Treating RN: [birth year] (44 y.o. 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 Dressing Change Frequency Wound #1 Right T Great oe Change dressing every day. Wound Cleansing Wound #1 Right T Great oe May shower and wash wound with soap and water. - with dressing change Primary Wound Dressing Wound #1 Right T Great oe Santyl Ointment Secondary Dressing Foam - to lateral foot for protection Wound #1 Right T Great oe Foam - foam donut Kerlix/Rolled Gauze Dry Gauze Edema Control Avoid standing for long periods of time Elevate legs to the level of the heart or above for 30 minutes daily and/or when sitting, a frequency of: - throughout the day Off-Loading Open toe surgical shoe to: - right foot Other: - minimal weight bearing right foot Electronic Signature(s) Signed: 05/01/2020 6:03:41 PM By: Dwayne Ham MD Signed: 05/02/2020 5:24:50 PM By: Levan Hurst RN, BSN Entered By: Levan Hurst on 05/01/2020 16:32:52 -------------------------------------------------------------------------------- Problem List Details Patient Name: Date of Service: Dwayne Rocha 05/01/2020 8:15 [name] Medical Record Number: [id number] Patient Account Number: [id number] Date of Birth/Sex: Treating RN: 16-Dec-1975 (44 y.o. Dwayne Rocha Primary Care Provider: Nolene Rocha Other Clinician: Referring Provider: Treating Provider/Extender: Dwayne Rocha in Treatment: 3 Active Problems ICD-10 Encounter Code Description Active Date MDM Diagnosis L97.509 Non-pressure chronic ulcer of other part  of unspecified foot with unspecified 04/07/2020 No Yes severity E11.621 Type 2 diabetes mellitus with foot ulcer 04/07/2020 No Yes M86.471 Chronic osteomyelitis with draining sinus, right ankle and foot 04/24/2020 No Yes Inactive Problems Resolved Problems Electronic Signature(s) Signed: 05/01/2020 6:03:41 PM By: Dwayne Najjarobson, Danissa Rundle MD Entered By: Dwayne Najjarobson, Rahi Chandonnet on 05/01/2020 09:03:00 -------------------------------------------------------------------------------- Progress Note Details Patient Name: Date of Service: Dwayne Rocha, Dwayne Rocha 05/01/2020 8:15 [name] Medical Record Number: [id number] Patient Account Number: [id number] Date of Birth/Sex: Treating RN: 02/19/1976 (44 y.o. Dwayne KollerM) Lynch, Shatara Primary Care Provider: Fleet ContrasAvbuere, Edwin Other Clinician: Referring Provider: Treating Provider/Extender: Dwayne Sabinobson, Nakoma Gotwalt [name], Edwin Weeks in Treatment: 3 Subjective History of Present Illness (HPI) 44 y o male with diabetes and diabetic neuropathy with right great toe blister that ruptured 2 weeks ago, he was in ED and received PO clindamycin and after that started on Keflex by PCP few days back He has no wound care other than bactoban to the area daily He gives no history of wounds He has T2DM and A1c listed as 5.8, he scored 2/10 on monofilament testing, ABI on right is 1.1 04/12/2020 upon evaluation today patient appears to be doing somewhat poorly in regard to his great toe ulcer on the right foot. He does have a blister as well on the lateral portion of his foot on the right. With that being said he has been using Sorbact that does not seem to have been beneficial at all in my opinion. [facility] would recommend that and send that into the mail order pharmacy for him today. I also think that even  though he had a negative x-ray in the ER that he probably needs to have an appointment for an MRI to further evaluate for any signs of deeper infection at this point. The patient is in agreement with that plan as well. 6/7; the patient has a completely nonviable surface to his right plantar first toe wound. He is using Santyl which he managed to obtain for a $50 co-pay. He has his MRI on the 13th. I have not seen his toe previously it looks somewhat swollen but not overtly so. 04/24/2020 upon evaluation today patient appears to be doing better as compared to last time I saw him fortunately. There does not appear to be any signs of active systemic infection at this time which is good news. No fever chills noted. With that being said he does still have evidence of infection of the toe and in fact he did have the MRI which shows that he has ulceration with a sinus tract extending to the first distal phalanx with osteomyelitis of the first distal phalanx no abscess noted. Fortunately this seems to be very limited to the distal portion of the toe at this point. Nonetheless I am still concerned about the osteomyelitis and the fact he has been on antibiotics, Keflex for quite some time before seeing me we now have had him on Levaquin as a result of his culture showing that he had Pseudomonas and Enterococcus. Fortunately the Levaquin covers both. 6/21; the patient appears to be doing quite well at the wound smaller although I understand his MRI was positive for osteomyelitis. He remains on Levaquin and has an appointment with Dr. Algis LimingVandam of infectious disease on 6/23. Objective Constitutional Vitals Time Taken: 8:40 AM, Height: 71 in, Weight: 335 lbs, BMI: 46.7, Temperature: 98.8 F, Pulse: 90 bpm, Respiratory Rate: 18 breaths/min, Blood Pressure: 129/78 mmHg. Integumentary (Hair, Skin) Wound #1  status is Open. Original cause of wound was Gradually Appeared. The wound is located on the Right T Great. The  wound measures 1.2cm length x oe 1cm width x 0.1cm depth; 0.942cm^2 area and 0.094cm^3 volume. There is Fat Layer (Subcutaneous Tissue) Exposed exposed. There is no tunneling or undermining noted. There is a medium amount of serosanguineous drainage noted. The wound margin is distinct with the outline attached to the wound base. There is medium (34-66%) red, hyper - granulation within the wound bed. There is a medium (34-66%) amount of necrotic tissue within the wound bed including Adherent Slough. 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 There was a Selective/Open Wound Non-Viable Tissue Debridement with a total area of 1.2 sq cm performed by Dwayne Caul., MD. With the following instrument(s): Curette to remove Non-Viable tissue/material. Material removed includes [facility]. No specimens were taken. A time out was conducted at 09:00, prior to the start of the procedure. A Minimum amount of bleeding was controlled with Pressure. The procedure was tolerated well with a pain level of 0 throughout and a pain level of 0 following the procedure. Post Debridement Measurements: 1.2cm length x 1cm width x 0.1cm depth; 0.094cm^3 volume. Character of Wound/Ulcer Post Debridement is improved. Severity of Tissue Post Debridement is: Fat layer exposed. Post procedure Diagnosis Wound #1: Same as Pre-Procedure Plan Follow-up Appointments: Return Appointment in 1 week. Dressing Change Frequency: Wound #1 Right T Great: oe Change dressing every day. Wound Cleansing: Wound #1 Right T Great: oe May shower and wash wound with  soap and water. - with dressing change Primary Wound Dressing: Wound #1 Right T Great: oe Santyl Ointment Secondary Dressing: Foam - to lateral foot for protection Wound #1 Right T Great: oe Foam - foam donut Kerlix/Rolled Gauze Dry Gauze Edema Control: Avoid standing for long periods of time Elevate legs to the level of the heart or above for 30 minutes daily and/or when sitting, a frequency of: - throughout the day Off-Loading: Open toe surgical shoe to: - right foot Other: - minimal weight bearing right foot #1 I'Rocha continuing with the Santyl #2 continue with Levaquin #3 is only using a surgical shoe for offloading I've told him to try and keep the pressure off this toe #4 I did not review the MRI today Electronic Signature(s) Signed: 05/01/2020 6:03:41 PM By: Dwayne Najjar MD Signed: 05/02/2020 5:24:50 PM By: Dwayne Abts RN, BSN Entered By: Dwayne Rocha on 05/01/2020 17:06:05 -------------------------------------------------------------------------------- SuperBill Details Patient Name: Date of Service: Dwayne Rocha 05/01/2020 Medical Record Number: [id number] Patient Account Number: [id number] Date of Birth/Sex: Treating RN: 1976-03-21 (44 y.o. 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

## 2020-05-03 ENCOUNTER — Ambulatory Visit (INDEPENDENT_AMBULATORY_CARE_PROVIDER_SITE_OTHER): Payer: 59 | Admitting: Infectious Disease

## 2020-05-03 ENCOUNTER — Encounter: Payer: Self-pay | Admitting: Infectious Disease

## 2020-05-03 ENCOUNTER — Other Ambulatory Visit: Payer: Self-pay

## 2020-05-03 DIAGNOSIS — E1140 Type 2 diabetes mellitus with diabetic neuropathy, unspecified: Secondary | ICD-10-CM | POA: Diagnosis not present

## 2020-05-03 DIAGNOSIS — M869 Osteomyelitis, unspecified: Secondary | ICD-10-CM | POA: Diagnosis not present

## 2020-05-03 DIAGNOSIS — B351 Tinea unguium: Secondary | ICD-10-CM | POA: Diagnosis not present

## 2020-05-03 HISTORY — DX: Osteomyelitis, unspecified: M86.9

## 2020-05-03 HISTORY — DX: Tinea unguium: B35.1

## 2020-05-03 HISTORY — DX: Type 2 diabetes mellitus with diabetic neuropathy, unspecified: E11.40

## 2020-05-03 MED ORDER — AMOXICILLIN-POT CLAVULANATE 875-125 MG PO TABS: 1.0000 | ORAL_TABLET | Freq: Two times a day (BID) | ORAL | 0 refills | Status: DC

## 2020-05-03 MED ORDER — CIPROFLOXACIN HCL 500 MG PO TABS: 500.0000 mg | ORAL_TABLET | Freq: Two times a day (BID) | ORAL | 0 refills | Status: DC

## 2020-05-03 NOTE — Progress Notes (Signed)
Subjective:    Reason for infectious disease consult osteomyelitis of the great toe  [facility] [place], [place]   Patient ID: Dwayne Rocha, male    DOB: December 10, 1975, 44 y.o.   MRN: [id number]  HPI  89 African-American man who has diabetes mellitus and hypertension who is managed by Dr. Henrietta Hoover for primary care he unfortunately developed lesion on his right toe roughly 2 months ago.  He has been followed closely in the wound care clinic and has received various antimicrobials and interventions there.  More recently he has grown Pseudomonas aeruginosa and ampicillin sensitive Enterococcus.  He had been on Keflex and levofloxacin was added.  Note the dose of levofloxacin at 500 mg is not typically an antipseudomonal drug dose for him unless his renal function is worse than we have in recent history.  He has been referred to [place] for valuation and management.  In reading through wound care's notes today had ideas about giving him hyperbaric oxygen along with IV antibiotics.  I have not had personally much success with that type of approach and think the better option for him is to have a curative amputation of his toe.  We had an extensive discussion about the management of diabetic foot infections in particular osteomyelitis.  He would like to proceed with referral to orthopedic surgeons and he is familiar with the first orthopedic surgeon that I recommended to him that was Toni Arthurs who is seen before for Charcot arthropathy.  Past Medical History:  Diagnosis Date  . Anxiety   . Depression   . Diabetes mellitus   . 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 Hyperlipidemia   . Hypertension   . Osteomyelitis of great toe of right foot (HCC) 05/03/2020    Past Surgical History:  Procedure Laterality Date  . ELBOW SURGERY    . HIP SURGERY     "pins in hips"-"growth plate slipped"  . NECK SURGERY    . WRIST SURGERY      Family History  Problem Relation Age of  Onset  . Hypertension Mother   . Diabetes Mother   . Stroke Maternal Grandmother   . Heart attack Maternal Grandfather   . Stroke Paternal Grandmother   . Stroke Paternal Grandfather   . Diabetes Brother   . Hypertension Brother       Social History   Socioeconomic History  . Marital status: Married    Spouse name: Not on file  . Number of children: 3  . Years of education: Not on file  . Highest education level: Not on file  Occupational History  . Not on file  Tobacco Use  . Smoking status: Former Smoker    Packs/day: 0.50    Years: 12.00    Pack years: 6.00    Types: Cigarettes    Quit date: 2017    Years since quitting: 4.4  . Smokeless tobacco: Never Used  Vaping Use  . Vaping Use: Never used  Substance and Sexual Activity  . Alcohol use: Yes    Comment: Socially   . Drug use: No  . Sexual activity: Yes  Other Topics Concern  . Not on file  Social History Narrative  . Not on file   Social Determinants of Health   Financial Resource Strain:   . Difficulty of Paying Living Expenses:   Food Insecurity:   . Worried About Programme researcher, broadcasting/film/video in the Last Year:   . [employer] of Food in the  Last Year:   Transportation Needs:   . Freight forwarder (Medical):   Marland Kitchen Lack of Transportation (Non-Medical):   Physical Activity:   . Days of Exercise per Week:   . Minutes of Exercise per Session:   Stress:   . Feeling of Stress :   Social Connections:   . Frequency of Communication with Friends and Family:   . Frequency of Social Gatherings with Friends and Family:   . Attends Religious Services:   . Active Member of Clubs or Organizations:   . Attends Banker Meetings:   Marland Kitchen Marital Status:     Allergies  Allergen Reactions  . Lyrica [Pregabalin] Swelling     Current Outpatient Medications:  .  amLODipine (NORVASC) 10 MG tablet, Take 10 mg by mouth daily., Disp: , Rfl:  .  aspirin EC 81 MG tablet, Take 1 tablet (81 mg total) by mouth daily.,  Disp: 30 tablet, Rfl: 3 .  furosemide (LASIX) 20 MG tablet, Take 20 mg by mouth daily as needed for edema., Disp: , Rfl:  .  HUMALOG MIX 75/25 (75-25) 100 UNIT/ML SUSP injection, Inject 20 Units into the skin 2 (two) times daily., Disp: , Rfl:  .  hydrochlorothiazide (HYDRODIURIL) 25 MG tablet, Take 25 mg by mouth daily., Disp: , Rfl:  .  insulin lispro (HUMALOG KWIKPEN) 100 UNIT/ML KwikPen, Inject 2-10 Units into the skin 3 (three) times daily before meals. Per sliding scale not given, using as needed, Disp: , Rfl:  .  lisinopril (ZESTRIL) 40 MG tablet, Take 40 mg by mouth daily., Disp: , Rfl:  .  metFORMIN (GLUCOPHAGE) 500 MG tablet, Take 500 mg by mouth 2 (two) times daily., Disp: , Rfl:  .  nitroGLYCERIN (NITROSTAT) 0.4 MG SL tablet, Place 1 tablet (0.4 mg total) under the tongue every 5 (five) minutes as needed for chest pain., Disp: 30 tablet, Rfl: 3 .  pantoprazole (PROTONIX) 40 MG tablet, Take 40 mg by mouth 2 (two) times daily., Disp: , Rfl:  .  simvastatin (ZOCOR) 10 MG tablet, Take 10 mg by mouth at bedtime., Disp: , Rfl:  .  amoxicillin-clavulanate (AUGMENTIN) 875-125 MG tablet, Take 1 tablet by mouth 2 (two) times daily., Disp: 60 tablet, Rfl: 0 .  ciprofloxacin (CIPRO) 500 MG tablet, Take 1 tablet (500 mg total) by mouth 2 (two) times daily., Disp: 20 tablet, Rfl: 0 .  Continuous Blood Gluc Sensor (FREESTYLE LIBRE 14 DAY SENSOR) MISC, Apply topically 3 (three) times daily., Disp: , Rfl:  .  ibuprofen (ADVIL) 800 MG tablet, Take 800 mg by mouth 3 (three) times daily as needed., Disp: , Rfl:  .  Insulin Syringe-Needle U-100 (INSULIN SYRINGE 1CC/31GX5/16") 31G X 5/16" 1 ML MISC, SMARTSIG:Injection Twice Daily, Disp: , Rfl:  .  SANTYL ointment, APPLY NICKEL THICK DAILY TO THE WOUND BEDTIME AND THEN COVER WITH A DRESSING AS DIRECTED IN CLINIC, Disp: , Rfl:     Review of Systems  Constitutional: Negative for activity change, appetite change, chills, diaphoresis, fatigue, fever and  unexpected weight change.  HENT: Negative for congestion, rhinorrhea, sinus pressure, sneezing, sore throat and trouble swallowing.   Eyes: Negative for photophobia and visual disturbance.  Respiratory: Negative for cough, chest tightness, shortness of breath, wheezing and stridor.   Cardiovascular: Negative for chest pain, palpitations and leg swelling.  Gastrointestinal: Negative for abdominal distention, abdominal pain, anal bleeding, blood in stool, constipation, diarrhea, nausea and vomiting.  Genitourinary: Negative for difficulty urinating, dysuria, flank pain and hematuria.  Musculoskeletal: Negative for arthralgias, back pain, gait problem, joint swelling and myalgias.  Skin: Positive for wound. Negative for color change, pallor and rash.  Neurological: Positive for numbness. Negative for dizziness, tremors, weakness and light-headedness.  Hematological: Negative for adenopathy. Does not bruise/bleed easily.  Psychiatric/Behavioral: Negative for agitation, behavioral problems, confusion, decreased concentration, dysphoric mood and sleep disturbance.       Objective:   Physical Exam Vitals reviewed.  Constitutional:      General: He is not in acute distress.    Appearance: Normal appearance. He is well-developed. He is obese. He is not ill-appearing or diaphoretic.  HENT:     Head: Normocephalic and atraumatic.     Right Ear: Hearing and external ear normal.     Left Ear: Hearing and external ear normal.     Nose: No nasal deformity or rhinorrhea.  Eyes:     General: No scleral icterus.    Conjunctiva/sclera: Conjunctivae normal.     Right eye: Right conjunctiva is not injected.     Left eye: Left conjunctiva is not injected.     Pupils: Pupils are equal, round, and reactive to light.  Neck:     Vascular: No JVD.  Cardiovascular:     Rate and Rhythm: Normal rate and regular rhythm.     Heart sounds: S1 normal and S2 normal.  Pulmonary:     Effort: Respiratory distress  present.  Abdominal:     General: There is no distension.     Palpations: Abdomen is soft.  Musculoskeletal:        General: Normal range of motion.     Right shoulder: Normal.     Left shoulder: Normal.     Cervical back: Normal range of motion and neck supple.     Right hip: Normal.     Left hip: Normal.     Right knee: Normal.     Left knee: Normal.  Lymphadenopathy:     Head:     Right side of head: No submandibular, preauricular or posterior auricular adenopathy.     Left side of head: No submandibular, preauricular or posterior auricular adenopathy.     Cervical: No cervical adenopathy.     Right cervical: No superficial or deep cervical adenopathy.    Left cervical: No superficial or deep cervical adenopathy.  Skin:    General: Skin is warm and dry.     Coloration: Skin is not pale.     Findings: No abrasion, bruising, ecchymosis, erythema, lesion or rash.     Nails: There is no clubbing.  Neurological:     General: No focal deficit present.     Mental Status: He is alert and oriented to person, place, and time.     Sensory: No sensory deficit.     Coordination: Coordination normal.     Gait: Gait normal.  Psychiatric:        Attention and Perception: He is attentive.        Mood and Affect: Mood normal.        Speech: Speech normal.        Behavior: Behavior normal. Behavior is cooperative.        Thought Content: Thought content normal.        Judgment: Judgment normal.     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  In the interim baseline labs inflammatory markers CBC and CMP.  His renal function remains it we will do ciprofloxacin 500 mg twice daily to cover the Pseudomonas along with Augmentin twice daily to cover his Enterococcus  Not clear to me that these are the  deep pathogens but I will cover them for now given the amount of drainage and to give him some time to get seen by orthopedic surgeon.  DM: He tells me his A1c is 6.6 and dramatically improved he does still suffer the neuropathy and can hardly feel anything in either foot.  Onychomycosis: Would benefit from Lamisil at some point although he has been told that it could affect his liver.  I will check LFTs today with his baseline labs he does not have problems with alcoholism or other hepatotoxins by initial history

## 2020-05-04 LAB — COMPLETE METABOLIC PANEL WITH GFR
AG Ratio: 1.1 (calc) (ref 1.0–2.5)
ALT: 19 U/L (ref 9–46)
AST: 12 U/L (ref 10–40)
Albumin: 3.7 g/dL (ref 3.6–5.1)
Alkaline phosphatase (APISO): 151 U/L — ABNORMAL HIGH (ref 36–130)
BUN/Creatinine Ratio: 22 (calc) (ref 6–22)
BUN: 36 mg/dL — ABNORMAL HIGH (ref 7–25)
CO2: 26 mmol/L (ref 20–32)
Calcium: 8.8 mg/dL (ref 8.6–10.3)
Chloride: 102 mmol/L (ref 98–110)
Creat: 1.67 mg/dL — ABNORMAL HIGH (ref 0.60–1.35)
GFR, Est African American: 58 mL/min/{1.73_m2} — ABNORMAL LOW (ref >=60)
GFR, Est Non African American: 50 mL/min/{1.73_m2} — ABNORMAL LOW (ref >=60)
Globulin: 3.3 g/dL (calc) (ref 1.9–3.7)
Glucose, Bld: 221 mg/dL — ABNORMAL HIGH (ref 65–99)
Potassium: 4.6 mmol/L (ref 3.5–5.3)
Sodium: 135 mmol/L (ref 135–146)
Total Bilirubin: 0.2 mg/dL (ref 0.2–1.2)
Total Protein: 7 g/dL (ref 6.1–8.1)

## 2020-05-04 LAB — CBC WITH DIFFERENTIAL/PLATELET
Absolute Monocytes: 479 cells/uL (ref 200–950)
Basophils Absolute: 41 cells/uL (ref 0–200)
Basophils Relative: 0.8 %
Eosinophils Absolute: 148 cells/uL (ref 15–500)
Eosinophils Relative: 2.9 %
HCT: 34.3 % — ABNORMAL LOW (ref 38.5–50.0)
Hemoglobin: 11.4 g/dL — ABNORMAL LOW (ref 13.2–17.1)
Lymphs Abs: 1454 cells/uL (ref 850–3900)
MCH: 30.1 pg (ref 27.0–33.0)
MCHC: 33.2 g/dL (ref 32.0–36.0)
MCV: 90.5 fL (ref 80.0–100.0)
MPV: 10.6 fL (ref 7.5–12.5)
Monocytes Relative: 9.4 %
Neutro Abs: 2978 cells/uL (ref 1500–7800)
Neutrophils Relative %: 58.4 %
Platelets: 231 10*3/uL (ref 140–400)
RBC: 3.79 10*6/uL — ABNORMAL LOW (ref 4.20–5.80)
RDW: 12.1 % (ref 11.0–15.0)
Total Lymphocyte: 28.5 %
WBC: 5.1 10*3/uL (ref 3.8–10.8)

## 2020-05-04 LAB — C-REACTIVE PROTEIN: CRP: 8 mg/L — ABNORMAL HIGH (ref <8.0)

## 2020-05-04 LAB — SEDIMENTATION RATE: Sed Rate: 63 mm/h — ABNORMAL HIGH (ref 0–15)

## 2020-05-08 ENCOUNTER — Other Ambulatory Visit: Payer: Self-pay

## 2020-05-08 ENCOUNTER — Encounter (HOSPITAL_BASED_OUTPATIENT_CLINIC_OR_DEPARTMENT_OTHER): Payer: Self-pay | Admitting: Orthopedic Surgery

## 2020-05-08 ENCOUNTER — Other Ambulatory Visit (HOSPITAL_COMMUNITY)
Admission: RE | Admit: 2020-05-08 | Discharge: 2020-05-08 | Disposition: A | Discharge status: Home / Self Care | Payer: 59 | Source: Ambulatory Visit | Attending: Orthopedic Surgery | Admitting: Orthopedic Surgery

## 2020-05-08 ENCOUNTER — Other Ambulatory Visit (HOSPITAL_COMMUNITY): Payer: Self-pay | Admitting: Orthopedic Surgery

## 2020-05-08 ENCOUNTER — Encounter (HOSPITAL_BASED_OUTPATIENT_CLINIC_OR_DEPARTMENT_OTHER): Payer: 59 | Admitting: Internal Medicine

## 2020-05-08 DIAGNOSIS — Z20822 Contact with and (suspected) exposure to covid-19: Secondary | ICD-10-CM | POA: Diagnosis not present

## 2020-05-08 DIAGNOSIS — E11621 Type 2 diabetes mellitus with foot ulcer: Secondary | ICD-10-CM | POA: Diagnosis not present

## 2020-05-08 DIAGNOSIS — Z01812 Encounter for preprocedural laboratory examination: Secondary | ICD-10-CM | POA: Insufficient documentation

## 2020-05-08 LAB — SARS CORONAVIRUS 2 (TAT 6-24 HRS): SARS Coronavirus 2: NEGATIVE

## 2020-05-08 NOTE — Progress Notes (Signed)
Anesthesia consult per Dr. Miller, will proceed with surgery as scheduled.  

## 2020-05-08 NOTE — Progress Notes (Signed)
Dwayne Rocha, Dwayne Rocha ([id number]) Visit Report for 05/08/2020 HPI Details Patient Name: Date of Service: Dwayne Rocha, Dwayne Rocha 05/08/2020 8:15 [name] Medical Record Number: [id number] Patient Account Number: [id number] Date of Birth/Sex: Treating RN: 05/20/1976 (44 y.o. Dwayne KollerM) Lynch, Shatara Primary Care Provider: Fleet ContrasAvbuere, Edwin Other Clinician: Referring Provider: Treating Provider/Extender: Burnetta Sabinobson, Vielka Klinedinst [name], Edwin Weeks in Treatment: 4 History of Present Illness HPI Description: 44 y o male with diabetes and diabetic neuropathy with right great toe blister that ruptured 2 weeks ago, he was in ED and received PO clindamycin and after that started on Keflex by PCP few days back He has no wound care other than bactoban to the area daily He gives no history of wounds He has T2DM and A1c listed as 5.8, he scored 2/10 on monofilament testing, ABI on right is 1.1 04/12/2020 upon evaluation today patient appears to be doing somewhat poorly in regard to his great toe ulcer on the right foot. He does have a blister as well on the lateral portion of his foot on the right. With that being said he has been using Sorbact that does not seem to have been beneficial at all in my opinion. I think he may actually benefit from [facility] I would recommend that and send that into the mail order pharmacy for him today. I also think that even though he had a negative x-ray in the ER that he probably needs to have an appointment for an MRI to further evaluate for any signs of deeper infection at this point. The patient is in agreement with that plan as well. 6/7; the patient has a completely nonviable surface to his right plantar first toe wound. He is using Santyl which he managed to obtain for a $50 co-pay. He has his MRI on the 13th. I have not seen his toe previously it looks somewhat swollen but not overtly so. 04/24/2020 upon evaluation today patient appears to be doing better as compared to last time I saw him fortunately.  There does not appear to be any signs of active systemic infection at this time which is good news. No fever chills noted. With that being said he does still have evidence of infection of the toe and in fact he did have the MRI which shows that he has ulceration with a sinus tract extending to the first distal phalanx with osteomyelitis of the first distal phalanx no abscess noted. Fortunately this seems to be very limited to the distal portion of the toe at this point. Nonetheless I am still concerned about the osteomyelitis and the fact he has been on antibiotics, Keflex for quite some time before seeing me we now have had him on Levaquin as a result of his culture showing that he had Pseudomonas and Enterococcus. Fortunately the Levaquin covers both. 6/21; the patient appears to be doing quite well at the wound smaller although I understand his MRI was positive for osteomyelitis. He remains on Levaquin and has an appointment with Dr. Algis LimingVandam of infectious disease on 6/23. 6/28; this is a patient I have partially followed. He is now on Augmentin and ciprofloxacin. Dr. Zenaida NieceVan dam referred him to Dr. Victorino DikeHewitt and he is scheduled for an amputation of the right great toe on Thursday. Electronic Signature(s) Signed: 05/08/2020 5:46:39 PM By: Baltazar Najjarobson, Nashaly Dorantes MD Entered By: Baltazar Najjarobson, Katera Rybka on 05/08/2020 08:56:40 -------------------------------------------------------------------------------- Physical Exam Details Patient Name: Date of Service: Dwayne BishopLA MPKIN, Dwayne Rocha 05/08/2020 8:15 [name] Medical Record Number: [id number] Patient Account Number: [id number] Date of Birth/Sex:  Treating RN: [birth year] (44 y.o. Dwayne Rocha Primary Care Provider: Fleet Contras Other Clinician: Referring Provider: Treating Provider/Extender: Burnetta Sabin in Treatment: 4 Constitutional Sitting or standing Blood Pressure is within target range for patient.. Pulse regular and within target range for  patient.Marland Kitchen Respirations regular, non-labored and within target range.. Temperature is normal and within the target range for the patient.Marland Kitchen Appears in no distress. Cardiovascular Pedal pulses are palpable. Notes Wound exam Small wound on the plantar aspect of the distal great toe. This probes to bone using a skinny. There is no gross purulence and no involvement of the inner phalangeal joint although his toes always look swollen to me this appears to be stable Electronic Signature(s) Signed: 05/08/2020 5:46:39 PM By: Baltazar Najjar MD Entered By: Baltazar Najjar on 05/08/2020 08:57:52 -------------------------------------------------------------------------------- Physician Orders Details Patient Name: Date of Service: [place] Scrape Rocha 05/08/2020 8:15 [name] Medical Record Number: [id number] Patient Account Number: [id number] Date of Birth/Sex: Treating RN: 1976/07/06 (44 y.o. 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 Wound Cleansing Wound #1 Right T Great oe May shower and wash wound with soap and water. - with dressing change Primary Wound Dressing Wound #1 Right T Great oe Calcium Alginate with Silver Secondary Dressing Foam - to lateral foot for protection Wound #1 Right T Great oe Foam - foam donut Kerlix/Rolled Gauze Dry Gauze Edema Control Avoid standing for long periods  of time Elevate legs to the level of the heart or above for 30 minutes daily and/or when sitting, a frequency of: - throughout the day Off-Loading Open toe surgical shoe to: - right foot Other: - minimal weight bearing right foot Electronic Signature(s) Signed: 05/08/2020 5:46:39 PM By: Baltazar Najjar MD Signed: 05/08/2020 6:01:25 PM By: Zandra Abts RN, BSN Entered By: Zandra Abts on 05/08/2020 08:55:44 -------------------------------------------------------------------------------- Problem List Details Patient Name: Date of Service: Dwayne Rocha 05/08/2020 8:15 [name] Medical Record Number: [id number] Patient Account Number: [id number] Date of Birth/Sex: Treating RN: [birth year] (44 y.o. 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 with unspecified 04/07/2020 No Yes severity E11.621 Type 2 diabetes mellitus with foot ulcer 04/07/2020 No Yes M86.471 Chronic osteomyelitis with draining sinus, right ankle and foot 04/24/2020 No Yes Inactive Problems Resolved Problems Electronic Signature(s) Signed: 05/08/2020 5:46:39 PM By: Baltazar Najjar MD Entered By: Baltazar Najjar on 05/08/2020 08:54:41 -------------------------------------------------------------------------------- Progress Note Details Patient Name: Date of Service: [place] Scrape Rocha 05/08/2020 8:15 [name] Medical Record Number: [id number] Patient Account Number: [id number] Date of Birth/Sex: Treating RN: 09-15-1976 (44 y.o. Dwayne Rocha Primary Care Provider: Fleet Contras Other Clinician: Referring Provider: Treating Provider/Extender: Burnetta Sabin in Treatment: 4 Subjective History of Present Illness (HPI) 55 y  o male with diabetes and diabetic neuropathy with right great toe blister  that ruptured 2 weeks ago, he was in ED and received PO clindamycin and after that started on Keflex by PCP few days back He has no wound care other than bactoban to the area daily He gives no history of wounds He has T2DM and A1c listed as 5.8, he scored 2/10 on monofilament testing, ABI on right is 1.1 04/12/2020 upon evaluation today patient appears to be doing somewhat poorly in regard to his great toe ulcer on the right foot. He does have a blister as well on the lateral portion of his foot on the right. With that being said he has been using Sorbact that does not seem to have been beneficial at all in my opinion. I think he may actually benefit from [facility] I would recommend that and send that into the mail order pharmacy for him today. I also think that even though he had a negative x-ray in the ER that he probably needs to have an appointment for an MRI to further evaluate for any signs of deeper infection at this point. The patient is in agreement with that plan as well. 6/7; the patient has a completely nonviable surface to his right plantar first toe wound. He is using Santyl which he managed to obtain for a $50 co-pay. He has his MRI on the 13th. I have not seen his toe previously it looks somewhat swollen but not overtly so. 04/24/2020 upon evaluation today patient appears to be doing better as compared to last time I saw him fortunately. There does not appear to be any signs of active systemic infection at this time which is good news. No fever chills noted. With that being said he does still have evidence of infection of the toe and in fact he did have the MRI which shows that he has ulceration with a sinus tract extending to the first distal phalanx with osteomyelitis of the first distal phalanx no abscess noted. Fortunately this seems to be very limited to the distal portion of the toe at this point. Nonetheless I  am still concerned about the osteomyelitis and the fact he has been on antibiotics, Keflex for quite some time before seeing me we now have had him on Levaquin as a result of his culture showing that he had Pseudomonas and Enterococcus. Fortunately the Levaquin covers both. 6/21; the patient appears to be doing quite well at the wound smaller although I understand his MRI was positive for osteomyelitis. He remains on Levaquin and has an appointment with Dr. Algis Liming of infectious disease on 6/23. 6/28; this is a patient I have partially followed. He is now on Augmentin and ciprofloxacin. Dr. Zenaida Niece dam referred him to Dr. Victorino Dike and he is scheduled for an amputation of the right great toe on Thursday. Objective Constitutional Sitting or standing Blood Pressure is within target range for patient.. Pulse regular and within target range for patient.Marland Kitchen Respirations regular, non-labored and within target range.. Temperature is normal and within the target range for the patient.Marland Kitchen Appears in no distress. Vitals Time Taken: 8:28 AM, Height: 71 in, Weight: 335 lbs, BMI: 46.7, Temperature: 98.9 F, Pulse: 94 bpm, Respiratory Rate: 20 breaths/min, Blood Pressure: 120/69 mmHg. Cardiovascular Pedal pulses are palpable. General Notes: Wound exam ooSmall wound on the plantar aspect of the distal great toe. This probes to bone using a skinny. There is no gross purulence and no involvement of the inner phalangeal joint although his toes always look swollen to me this appears to be stable Integumentary (Hair,  Skin) Wound #1 status is Open. Original cause of wound was Gradually Appeared. The wound is located on the Right T Great. The wound measures 0.7cm length x oe 0.3cm width x 1cm depth; 0.165cm^2 area and 0.165cm^3 volume. There is Fat Layer (Subcutaneous Tissue) Exposed exposed. There is no tunneling or undermining noted. There is a medium amount of serosanguineous drainage noted. The wound margin is distinct  with the outline attached to the wound base. There is medium (34-66%) pink granulation within the wound bed. There is a medium (34-66%) amount of necrotic tissue within the wound bed including Adherent Slough. 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: Discharge from [facility] Care Center - continue follow up with Orthopedic Dressing Change Frequency: Wound #1 Right T Great: oe Change Dressing every other day. Wound Cleansing: Wound #1 Right T Great: oe May shower and wash wound with soap and water. - with dressing change Primary Wound Dressing: Wound #1 Right T Great: oe Calcium Alginate with Silver Secondary Dressing: Foam - to lateral foot for protection Wound #1 Right T Great: oe Foam - foam donut Kerlix/Rolled Gauze Dry Gauze Edema Control: Avoid standing for long periods of time Elevate legs to the level of the heart or above for 30 minutes daily and/or when sitting, a frequency of: - throughout the day Off-Loading: Open toe surgical shoe to: - right foot Other: - minimal weight bearing right foot 1. The patient is already made his decision to undergo amputation of the toe. I think he feels this would give him the best chance of getting back to full activity including work which I think is a reasonable assumption. I do not think he has a significant macrovascular issue which should impair healing of the surgical wound 2. In view of the above unless he has difficulties healing the amputation site there is no reason for him to come back to see [place]. I think in the short interim between now and when he has his amputation we can use silver alginate. Electronic Signature(s) Signed: 05/08/2020 5:46:39 PM By: Baltazar Najjar MD Entered By: Baltazar Najjar on  05/08/2020 08:59:10 -------------------------------------------------------------------------------- SuperBill Details Patient Name: Date of Service: Augusto Garbe, Marijean Niemann 05/08/2020 Medical Record Number: [id number] Patient Account Number: [id number] Date of Birth/Sex: Treating RN: 11/16/1975 (44 y.o. 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 E11.621 Type 2 diabetes mellitus with foot ulcer M86.471 Chronic osteomyelitis with draining sinus, right ankle and foot Facility Procedures CPT4 Code: [id number] Description: 99213 - WOUND CARE VISIT-LEV 3 EST PT Modifier: Quantity: 1 Physician Procedures : CPT4 Code Description Modifier [id number] - WC PHYS LEVEL 3 - EST PT ICD-10 Diagnosis Description 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

## 2020-05-09 NOTE — Progress Notes (Signed)
Dwayne Rocha, Dwayne Rocha ([id number]) Visit Report for 05/08/2020 Arrival Information Details Patient Name: Date of Service: Dwayne Rocha 05/08/2020 8:15 [name] Medical Record Number: [id number] Patient Account Number: [id number] Date of Birth/Sex: Treating RN: 03/11/1976 (44 y.o. [name]) Yevonne PaxEpps, Carrie Primary Care Chamari Cutbirth: Fleet ContrasAvbuere, Edwin Other Clinician: Referring Malik Paar: Treating Aleria Maheu/Extender: Burnetta Sabinobson, Michael [name], Edwin Weeks in Treatment: 4 Visit Information History Since Last Visit All ordered tests and consults were completed: No Patient Arrived: Ambulatory Added or deleted any medications: No Arrival Time: 08:23 Any new allergies or adverse reactions: No Accompanied By: self Had a fall or experienced change in No Transfer Assistance: None activities of daily living that may affect Patient Identification Verified: Yes risk of falls: Secondary Verification Process Completed: Yes Signs or symptoms of abuse/neglect since last visito No Patient Requires Transmission-Based Precautions: No Hospitalized since last visit: No Patient Has Alerts: No Implantable device outside of the clinic excluding No cellular tissue based products placed in the center since last visit: Has Dressing in Place as Prescribed: Yes Has Compression in Place as Prescribed: Yes Pain Present Now: No Electronic Signature(s) Signed: 05/09/2020 5:22:48 PM By: Yevonne PaxEpps, Carrie RN Entered By: Yevonne PaxEpps, Carrie on 05/08/2020 08:24:00 -------------------------------------------------------------------------------- Clinic Level of Care Assessment Details Patient Name: Date of Service: Dwayne Rocha 05/08/2020 8:15 [name] Medical Record Number: [id number] Patient Account Number: [id number] Date of Birth/Sex: Treating RN: 05/19/1976 (44 y.o. Dwayne Rocha) Rocha, Dwayne Primary Care Micha Erck: Fleet ContrasAvbuere, Edwin Other Clinician: Referring Rolen Conger: Treating Dwayne Rocha/Extender: Burnetta Sabinobson, Michael [name], Edwin Weeks in Treatment: 4 Clinic  Level of Care Assessment Items TOOL 4 Quantity Score X- 1 0 Use when only an EandM is performed on FOLLOW-UP visit ASSESSMENTS - Nursing Assessment / Reassessment X- 1 10 Reassessment of Co-morbidities (includes updates in patient status) X- 1 5 Reassessment of Adherence to Treatment Plan ASSESSMENTS - Wound and Skin A ssessment / Reassessment X - Simple Wound Assessment / Reassessment - one wound 1 5 []  - 0 Complex Wound Assessment / Reassessment - multiple wounds []  - 0 Dermatologic / Skin Assessment (not related to wound area) ASSESSMENTS - Focused Assessment []  - 0 Circumferential Edema Measurements - multi extremities []  - 0 Nutritional Assessment / Counseling / Intervention X- 1 5 Lower Extremity Assessment (monofilament, tuning fork, pulses) []  - 0 Peripheral Arterial Disease Assessment (using hand held doppler) ASSESSMENTS - Ostomy and/or Continence Assessment and Care []  - 0 Incontinence Assessment and Management []  - 0 Ostomy Care Assessment and Management (repouching, etc.) PROCESS - Coordination of Care X - Simple Patient / Family Education for ongoing care 1 15 []  - 0 Complex (extensive) Patient / Family Education for ongoing care X- 1 10 Staff obtains ChiropractorConsents, Records, T Results / Process Orders est []  - 0 Staff telephones HHA, Nursing Homes / Clarify orders / etc []  - 0 Routine Transfer to another Facility (non-emergent condition) []  - 0 Routine Hospital Admission (non-emergent condition) []  - 0 New Admissions / Manufacturing engineernsurance Authorizations / Ordering NPWT Apligraf, etc. , []  - 0 Emergency Hospital Admission (emergent condition) X- 1 10 Simple Discharge Coordination []  - 0 Complex (extensive) Discharge Coordination PROCESS - Special Needs []  - 0 Pediatric / Minor Patient Management []  - 0 Isolation Patient Management []  - 0 Hearing / Language / Visual special needs []  - 0 Assessment of Community assistance (transportation, D/C planning, etc.) []   - 0 Additional assistance / Altered mentation []  - 0 Support Surface(s) Assessment (bed, cushion, seat, etc.) INTERVENTIONS - Wound Cleansing / Measurement X - Simple Wound  Cleansing - one wound 1 5 []  - 0 Complex Wound Cleansing - multiple wounds X- 1 5 Wound Imaging (photographs - any number of wounds) []  - 0 Wound Tracing (instead of photographs) X- 1 5 Simple Wound Measurement - one wound []  - 0 Complex Wound Measurement - multiple wounds INTERVENTIONS - Wound Dressings X - Small Wound Dressing one or multiple wounds 1 10 []  - 0 Medium Wound Dressing one or multiple wounds []  - 0 Large Wound Dressing one or multiple wounds X- 1 5 Application of Medications - topical []  - 0 Application of Medications - injection INTERVENTIONS - Miscellaneous []  - 0 External ear exam []  - 0 Specimen Collection (cultures, biopsies, blood, body fluids, etc.) []  - 0 Specimen(s) / Culture(s) sent or taken to Lab for analysis []  - 0 Patient Transfer (multiple staff / / Similar devices) []  - 0 Simple Staple / Suture removal (25 or less) []  - 0 Complex Staple / Suture removal (26 or more) []  - 0 Hypo / Hyperglycemic Management (close monitor of Blood Glucose) []  - 0 Ankle / Brachial Index (ABI) - do not check if billed separately X- 1 5 Vital Signs Has the patient been seen at the hospital within the last three years: Yes Total Score: 95 Level Of Care: New/Established - Level 3 Electronic Signature(s) Signed: 05/08/2020 6:01:25 PM By: RN, BSN Entered By: on 05/08/2020 09:08:26 -------------------------------------------------------------------------------- Encounter Discharge Information Details Patient Name: Date of Service: Rocha 05/08/2020 8:15 [name] Medical Record Number: Patient Account Number: Date of Birth/Sex: Treating RN: 1976-03-01 (44 y.o. Primary Care [name]: Other  Clinician: Referring Mahlik Lenn: Treating Riot Barrick/Extender: in Treatment: 4 Encounter Discharge Information Items Discharge Condition: Stable Ambulatory Status: Ambulatory Discharge Destination: Home Transportation: Private Auto Accompanied By: alone Schedule Follow-up Appointment: Yes Clinical Summary of Care: Patient Declined Electronic Signature(s) Signed: 05/08/2020 6:01:25 PM By: 05/10/2020 RN, BSN Entered By: Zandra Abts on 05/08/2020 09:08:59 -------------------------------------------------------------------------------- Lower Extremity Assessment Details Patient Name: Date of Service: Dwayne 05/10/2020 05/08/2020 8:15 [name] Medical Record Number: 05/10/2020 Patient Account Number: [id number] Date of Birth/Sex: Treating RN: 09-04-1976 (44 y.o. 47 Primary Care Evelisse Szalkowski: Dwayne Koller Other Clinician: Referring Janis Cuffe: Treating Shakedra Beam/Extender: Fleet Contras in Treatment: 4 Edema Assessment Assessed: Burnetta Sabin: No] 05/10/2020: No] Edema: [Left: Ye] [Right: s] Calf Left: Right: Point of Measurement: 47 cm From Medial Instep cm 44 cm Ankle Left: Right: Point of Measurement: 12 cm From Medial Instep cm 24 cm Electronic Signature(s) Signed: 05/09/2020 5:22:48 PM By: Zandra Abts RN Entered By: 05/10/2020 on 05/08/2020 08:34:21 -------------------------------------------------------------------------------- Multi Wound Chart Details Patient Name: Date of Service: 05/10/2020 Rocha 05/08/2020 8:15 [name] Medical Record Number: [id number] Patient Account Number: 02/11/1976 Date of Birth/Sex: Treating RN: 08/02/1976 (44 y.o. Fleet Contras Primary Care Safwan Tomei: Burnetta Sabin Other Clinician: Referring Liliann File: Treating Scottlynn Lindell/Extender: [name] in Treatment: 4 Vital Signs Height(in): 71 Pulse(bpm): 94 Weight(lbs): 335 Blood Pressure(mmHg): 120/69 Body Mass  Index(BMI): 47 Temperature(F): 98.9 Respiratory Rate(breaths/min): 20 Photos: [1:No Photos Right T Great oe] [N/A:N/A N/A] Wound Location: [1:Gradually Appeared] [N/A:N/A] Wounding Event: [1:Diabetic Wound/Ulcer of the Lower] [N/A:N/A] Primary Etiology: [1:Extremity Hypertension, Type II Diabetes] [N/A:N/A] Comorbid History: [1:03/24/2020] [N/A:N/A] Date Acquired: [1:4] [N/A:N/A] Weeks of Treatment: [1:Open] [N/A:N/A] Wound Status: [1:0.7x0.3x1] [N/A:N/A] Measurements L x W x D (cm) [1:0.165] [N/A:N/A] A (cm) : rea [1:0.165] [  N/A:N/A] Volume (cm) : [1:96.00%] [N/A:N/A] % Reduction in A rea: [1:60.20%] [N/A:N/A] % Reduction in Volume: [1:Grade 3] [N/A:N/A] Classification: [1:Medium] [N/A:N/A] Exudate A mount: [1:Serosanguineous] [N/A:N/A] Exudate Type: [1:red, brown] [N/A:N/A] Exudate Color: [1:Distinct, outline attached] [N/A:N/A] Wound Margin: [1:Medium (34-66%)] [N/A:N/A] Granulation A mount: [1:Pink] [N/A:N/A] Granulation Quality: [1:Medium (34-66%)] [N/A:N/A] Necrotic A mount: [1:Fat Layer (Subcutaneous Tissue)] [N/A:N/A] Exposed Structures: [1:Exposed: Yes Fascia: No Tendon: No Muscle: No Joint: No Bone: No None] [N/A:N/A] Treatment Notes Electronic Signature(s) Signed: 05/08/2020 5:46:39 PM By: Baltazar Najjar MD Signed: 05/08/2020 6:01:25 PM By: Zandra Abts RN, BSN Entered By: Baltazar Najjar on 05/08/2020 08:54:50 -------------------------------------------------------------------------------- Multi-Disciplinary Care Plan Details Patient Name: Date of Service: Augusto Garbe, [facility] Rocha 05/08/2020 8:15 [name] Medical Record Number: [id number] Patient Account Number: [id number] Date of Birth/Sex: Treating RN: Aug 29, 1976 (44 y.o. Dwayne Koller Primary Care Fusae Florio: Fleet Contras Other Clinician: Referring Romel Dumond: Treating Unique Searfoss/Extender: Burnetta Sabin in Treatment: 4 Active Inactive Electronic Signature(s) Signed: 05/08/2020 6:01:25 PM By:  Zandra Abts RN, BSN Entered By: Zandra Abts on 05/08/2020 09:07:07 -------------------------------------------------------------------------------- Pain Assessment Details Patient Name: Date of Service: Dwayne Rocha 05/08/2020 8:15 [name] Medical Record Number: [id number] Patient Account Number: [id number] Date of Birth/Sex: Treating RN: 02-Sep-1976 (44 y.o. [name] Primary Care Tema Alire: Fleet Contras Other Clinician: Referring Rayna Brenner: Treating Theophilus Walz/Extender: Burnetta Sabin in Treatment: 4 Active Problems Location of Pain Severity and Description of Pain Patient Has Paino No Site Locations Pain Management and Medication Current Pain Management: Electronic Signature(s) Signed: 05/09/2020 5:22:48 PM By: Yevonne Pax RN Entered By: Yevonne Pax on 05/08/2020 08:28:57 -------------------------------------------------------------------------------- Patient/Caregiver Education Details Patient Name: Date of Service: Dwayne Rocha 6/28/2021andnbsp8:15 [name] Medical Record Number: [id number] Patient Account Number: [id number] Date of Birth/Gender: Treating RN: Feb 08, 1976 (44 y.o. Dwayne Koller Primary Care Physician: Fleet Contras Other Clinician: Referring Physician: Treating Physician/Extender: Burnetta Sabin in Treatment: 4 Education Assessment Education Provided To: Patient Education Topics Provided Infection: Methods: Explain/Verbal Responses: State content correctly Wound/Skin Impairment: Methods: Explain/Verbal Responses: State content correctly Electronic Signature(s) Signed: 05/08/2020 6:01:25 PM By: Zandra Abts RN, BSN Entered By: Zandra Abts on 05/08/2020 09:07:23 -------------------------------------------------------------------------------- Wound Assessment Details Patient Name: Date of Service: [place] Scrape Rocha 05/08/2020 8:15 [name] Medical Record Number: [id number] Patient Account  Number: [id number] Date of Birth/Sex: Treating RN: 04/07/1976 (44 y.o. Judie Petit) Yevonne Pax Primary Care Lucie Friedlander: Fleet Contras Other Clinician: Referring Raeonna Milo: Treating Keyontae Huckeby/Extender: Burnetta Sabin in Treatment: 4 Wound Status Wound Number: 1 Primary Etiology: Diabetic Wound/Ulcer of the Lower Extremity Wound Location: Right T Great oe Wound Status: Open Wounding Event: Gradually Appeared Comorbid History: Hypertension, Type II Diabetes Date Acquired: 03/24/2020 Weeks Of Treatment: 4 Clustered Wound: No Photos Photo Uploaded By: Benjaman Kindler on 05/09/2020 09:37:00 Wound Measurements Length: (cm) 0.7 Width: (cm) 0.3 Depth: (cm) 1 Area: (cm) 0.165 Volume: (cm) 0.165 % Reduction in Area: 96% % Reduction in Volume: 60.2% Epithelialization: None Tunneling: No Undermining: No Wound Description Classification: Grade 3 Wound Margin: Distinct, outline attached Exudate Amount: Medium Exudate Type: Serosanguineous Exudate Color: red, brown Foul Odor After Cleansing: No Slough/Fibrino Yes Wound Bed Granulation Amount: Medium (34-66%) Exposed Structure Granulation Quality: Pink Fascia Exposed: No Necrotic Amount: Medium (34-66%) Fat Layer (Subcutaneous Tissue) Exposed: Yes Necrotic Quality: Adherent Slough Tendon Exposed: No Muscle Exposed: No Joint Exposed: No Bone Exposed: No Electronic Signature(s) Signed: 05/09/2020 5:22:48 PM By: Yevonne Pax RN Entered By: Yevonne Pax on 05/08/2020 08:35:33 -------------------------------------------------------------------------------- Vitals  Details Patient Name: Date of Service: Dwayne Rocha 05/08/2020 8:15 [name] Medical Record Number: [id number] Patient Account Number: [id number] Date of Birth/Sex: Treating RN: 1976/05/18 (44 y.o. Judie Petit) Yevonne Pax Primary Care Ronasia Isola: Fleet Contras Other Clinician: Referring Quinzell Malcomb: Treating Pheonix Clinkscale/Extender: Burnetta Sabin in  Treatment: 4 Vital Signs Time Taken: 08:28 Temperature (F): 98.9 Height (in): 71 Pulse (bpm): 94 Weight (lbs): 335 Respiratory Rate (breaths/min): 20 Body Mass Index (BMI): 46.7 Blood Pressure (mmHg): 120/69 Reference Range: 80 - 120 mg / dl Electronic Signature(s) Signed: 05/09/2020 5:22:48 PM By: Yevonne Pax RN Entered By: Yevonne Pax on 05/08/2020 09:38:18

## 2020-05-10 ENCOUNTER — Ambulatory Visit: Payer: Medicaid Other | Admitting: Cardiology

## 2020-05-11 ENCOUNTER — Ambulatory Visit (HOSPITAL_COMMUNITY)
Admission: RE | Admit: 2020-05-11 | Discharge: 2020-05-11 | Disposition: A | Discharge status: Home / Self Care | Payer: 59 | Attending: Orthopedic Surgery | Admitting: Orthopedic Surgery

## 2020-05-11 ENCOUNTER — Emergency Department (HOSPITAL_COMMUNITY)
Admission: EM | Admit: 2020-05-11 | Discharge: 2020-05-11 | Disposition: A | Discharge status: Home / Self Care | Payer: 59 | Source: Home / Self Care | Attending: Emergency Medicine | Admitting: Emergency Medicine

## 2020-05-11 ENCOUNTER — Encounter (HOSPITAL_COMMUNITY): Payer: Self-pay | Admitting: Emergency Medicine

## 2020-05-11 ENCOUNTER — Encounter (HOSPITAL_BASED_OUTPATIENT_CLINIC_OR_DEPARTMENT_OTHER)
Admission: RE | Disposition: A | Discharge status: Home / Self Care | Payer: Self-pay | Source: Home / Self Care | Attending: Orthopedic Surgery

## 2020-05-11 ENCOUNTER — Ambulatory Visit (HOSPITAL_BASED_OUTPATIENT_CLINIC_OR_DEPARTMENT_OTHER): Payer: 59 | Admitting: Anesthesiology

## 2020-05-11 ENCOUNTER — Other Ambulatory Visit: Payer: Self-pay

## 2020-05-11 DIAGNOSIS — M96830 Postprocedural hemorrhage and hematoma of a musculoskeletal structure following a musculoskeletal system procedure: Secondary | ICD-10-CM | POA: Insufficient documentation

## 2020-05-11 DIAGNOSIS — L7622 Postprocedural hemorrhage and hematoma of skin and subcutaneous tissue following other procedure: Secondary | ICD-10-CM | POA: Insufficient documentation

## 2020-05-11 DIAGNOSIS — Z79899 Other long term (current) drug therapy: Secondary | ICD-10-CM | POA: Diagnosis not present

## 2020-05-11 DIAGNOSIS — Z87891 Personal history of nicotine dependence: Secondary | ICD-10-CM | POA: Diagnosis not present

## 2020-05-11 DIAGNOSIS — I10 Essential (primary) hypertension: Secondary | ICD-10-CM | POA: Insufficient documentation

## 2020-05-11 DIAGNOSIS — F419 Anxiety disorder, unspecified: Secondary | ICD-10-CM | POA: Diagnosis not present

## 2020-05-11 DIAGNOSIS — F329 Major depressive disorder, single episode, unspecified: Secondary | ICD-10-CM | POA: Diagnosis not present

## 2020-05-11 DIAGNOSIS — Z7982 Long term (current) use of aspirin: Secondary | ICD-10-CM | POA: Insufficient documentation

## 2020-05-11 DIAGNOSIS — Z794 Long term (current) use of insulin: Secondary | ICD-10-CM | POA: Insufficient documentation

## 2020-05-11 DIAGNOSIS — T148XXA Other injury of unspecified body region, initial encounter: Secondary | ICD-10-CM

## 2020-05-11 DIAGNOSIS — E1140 Type 2 diabetes mellitus with diabetic neuropathy, unspecified: Secondary | ICD-10-CM | POA: Insufficient documentation

## 2020-05-11 DIAGNOSIS — M869 Osteomyelitis, unspecified: Secondary | ICD-10-CM | POA: Diagnosis not present

## 2020-05-11 DIAGNOSIS — Z79891 Long term (current) use of opiate analgesic: Secondary | ICD-10-CM | POA: Insufficient documentation

## 2020-05-11 DIAGNOSIS — Z89411 Acquired absence of right great toe: Secondary | ICD-10-CM | POA: Diagnosis not present

## 2020-05-11 DIAGNOSIS — Z6841 Body Mass Index (BMI) 40.0 and over, adult: Secondary | ICD-10-CM | POA: Insufficient documentation

## 2020-05-11 DIAGNOSIS — E785 Hyperlipidemia, unspecified: Secondary | ICD-10-CM | POA: Insufficient documentation

## 2020-05-11 DIAGNOSIS — E119 Type 2 diabetes mellitus without complications: Secondary | ICD-10-CM | POA: Insufficient documentation

## 2020-05-11 DIAGNOSIS — L97514 Non-pressure chronic ulcer of other part of right foot with necrosis of bone: Secondary | ICD-10-CM

## 2020-05-11 HISTORY — PX: AMPUTATION TOE: SHX6595

## 2020-05-11 HISTORY — DX: Other complications of anesthesia, initial encounter: T88.59XA

## 2020-05-11 LAB — GLUCOSE, CAPILLARY
Glucose-Capillary: 107 mg/dL — ABNORMAL HIGH (ref 70–99)
Glucose-Capillary: 108 mg/dL — ABNORMAL HIGH (ref 70–99)

## 2020-05-11 SURGERY — AMPUTATION, TOE
Anesthesia: Monitor Anesthesia Care | Site: Toe | Laterality: Right

## 2020-05-11 MED ORDER — OXYCODONE HCL 5 MG/5ML PO SOLN: 5.0000 mg | Freq: Once | ORAL | Status: AC | PRN

## 2020-05-11 MED ORDER — DEXTROSE 5 % IV SOLN
3.0000 g | INTRAVENOUS | Status: AC
  Administered 2020-05-11: 3 g via INTRAVENOUS
  Filled 2020-05-11: qty 3000

## 2020-05-11 MED ORDER — PROPOFOL 10 MG/ML IV BOLUS
INTRAVENOUS | Status: DC | PRN
  Administered 2020-05-11: 40 mg via INTRAVENOUS

## 2020-05-11 MED ORDER — PROPOFOL 10 MG/ML IV BOLUS
INTRAVENOUS | Status: AC
  Filled 2020-05-11: qty 20

## 2020-05-11 MED ORDER — MIDAZOLAM HCL 2 MG/2ML IJ SOLN
INTRAMUSCULAR | Status: AC
  Filled 2020-05-11: qty 2

## 2020-05-11 MED ORDER — ACETAMINOPHEN 500 MG PO TABS
1000.0000 mg | ORAL_TABLET | Freq: Once | ORAL | Status: AC
  Administered 2020-05-11: 1000 mg via ORAL

## 2020-05-11 MED ORDER — LACTATED RINGERS IV SOLN: INTRAVENOUS | Status: DC

## 2020-05-11 MED ORDER — OXYCODONE HCL 5 MG PO TABS
ORAL_TABLET | ORAL | Status: AC
  Filled 2020-05-11: qty 1

## 2020-05-11 MED ORDER — TRANEXAMIC ACID FOR EPISTAXIS
500.0000 mg | Freq: Once | TOPICAL | Status: AC
  Administered 2020-05-11: 500 mg via TOPICAL
  Filled 2020-05-11: qty 10

## 2020-05-11 MED ORDER — VANCOMYCIN HCL 500 MG IV SOLR
INTRAVENOUS | Status: DC | PRN
  Administered 2020-05-11: 500 mg

## 2020-05-11 MED ORDER — FENTANYL CITRATE (PF) 100 MCG/2ML IJ SOLN
INTRAMUSCULAR | Status: AC
  Filled 2020-05-11: qty 2

## 2020-05-11 MED ORDER — SODIUM CHLORIDE 0.9 % IV SOLN: INTRAVENOUS | Status: DC

## 2020-05-11 MED ORDER — ACETAMINOPHEN 500 MG PO TABS
ORAL_TABLET | ORAL | Status: AC
  Filled 2020-05-11: qty 2

## 2020-05-11 MED ORDER — OXYCODONE HCL 5 MG PO TABS
5.0000 mg | ORAL_TABLET | Freq: Once | ORAL | Status: AC | PRN
  Administered 2020-05-11: 5 mg via ORAL

## 2020-05-11 MED ORDER — MIDAZOLAM HCL 5 MG/5ML IJ SOLN
INTRAMUSCULAR | Status: DC | PRN
  Administered 2020-05-11: 2 mg via INTRAVENOUS

## 2020-05-11 MED ORDER — LIDOCAINE-EPINEPHRINE 1 %-1:100000 IJ SOLN
INTRAMUSCULAR | Status: AC
  Filled 2020-05-11: qty 1

## 2020-05-11 MED ORDER — HYDROMORPHONE HCL 1 MG/ML IJ SOLN: 0.2500 mg | INTRAMUSCULAR | Status: DC | PRN

## 2020-05-11 MED ORDER — BUPIVACAINE HCL (PF) 0.5 % IJ SOLN
INTRAMUSCULAR | Status: AC
  Filled 2020-05-11: qty 30

## 2020-05-11 MED ORDER — BUPIVACAINE HCL (PF) 0.5 % IJ SOLN
INTRAMUSCULAR | Status: DC | PRN
  Administered 2020-05-11: 5 mL

## 2020-05-11 MED ORDER — CEFAZOLIN SODIUM-DEXTROSE 2-4 GM/100ML-% IV SOLN
INTRAVENOUS | Status: AC
  Filled 2020-05-11: qty 200

## 2020-05-11 MED ORDER — PROMETHAZINE HCL 25 MG/ML IJ SOLN: 6.2500 mg | INTRAMUSCULAR | Status: DC | PRN

## 2020-05-11 MED ORDER — VANCOMYCIN HCL 500 MG IV SOLR
INTRAVENOUS | Status: AC
  Filled 2020-05-11: qty 500

## 2020-05-11 MED ORDER — KETOROLAC TROMETHAMINE 30 MG/ML IJ SOLN: 30.0000 mg | Freq: Once | INTRAMUSCULAR | Status: DC | PRN

## 2020-05-11 MED ORDER — PROPOFOL 500 MG/50ML IV EMUL
INTRAVENOUS | Status: DC | PRN
Start: 2020-05-11 — End: 2020-05-11
  Administered 2020-05-11: 130 ug/kg/min via INTRAVENOUS

## 2020-05-11 MED ORDER — LIDOCAINE-EPINEPHRINE 1 %-1:100000 IJ SOLN
INTRAMUSCULAR | Status: DC | PRN
  Administered 2020-05-11: 5 mL

## 2020-05-11 SURGICAL SUPPLY — 64 items
APL PRP STRL LF DISP 70% ISPRP (MISCELLANEOUS) ×1
BLADE AVERAGE 25MMX9MM (BLADE) ×1
BLADE AVERAGE 25X9 (BLADE) ×2 IMPLANT
BLADE OSC/SAG .038X5.5 CUT EDG (BLADE) IMPLANT
BLADE SURG 10 STRL SS (BLADE) IMPLANT
BLADE SURG 15 STRL LF DISP TIS (BLADE) ×2 IMPLANT
BLADE SURG 15 STRL SS (BLADE) ×6
BNDG CMPR 9X4 STRL LF SNTH (GAUZE/BANDAGES/DRESSINGS) ×1
BNDG COHESIVE 4X5 TAN STRL (GAUZE/BANDAGES/DRESSINGS) ×3 IMPLANT
BNDG CONFORM 3 STRL LF (GAUZE/BANDAGES/DRESSINGS) IMPLANT
BNDG ESMARK 4X9 LF (GAUZE/BANDAGES/DRESSINGS) ×3 IMPLANT
CHLORAPREP W/TINT 26 (MISCELLANEOUS) ×3 IMPLANT
COVER BACK TABLE 60X90IN (DRAPES) ×3 IMPLANT
COVER WAND RF STERILE (DRAPES) IMPLANT
CUFF TOURN SGL QUICK 24 (TOURNIQUET CUFF)
CUFF TRNQT CYL 24X4X16.5-23 (TOURNIQUET CUFF) IMPLANT
DECANTER SPIKE VIAL GLASS SM (MISCELLANEOUS) IMPLANT
DRAPE EXTREMITY T 121X128X90 (DISPOSABLE) ×3 IMPLANT
DRAPE SURG 17X23 STRL (DRAPES) IMPLANT
DRAPE U-SHAPE 47X51 STRL (DRAPES) ×3 IMPLANT
DRSG EMULSION OIL 3X3 NADH (GAUZE/BANDAGES/DRESSINGS) IMPLANT
DRSG MEPITEL 4X7.2 (GAUZE/BANDAGES/DRESSINGS) ×3 IMPLANT
DRSG PAD ABDOMINAL 8X10 ST (GAUZE/BANDAGES/DRESSINGS) ×3 IMPLANT
ELECT REM PT RETURN 9FT ADLT (ELECTROSURGICAL) ×3
ELECTRODE REM PT RTRN 9FT ADLT (ELECTROSURGICAL) ×1 IMPLANT
GAUZE SPONGE 4X4 12PLY STRL (GAUZE/BANDAGES/DRESSINGS) ×3 IMPLANT
GLOVE BIO SURGEON STRL SZ8 (GLOVE) ×3 IMPLANT
GLOVE BIOGEL M 6.5 STRL (GLOVE) ×3 IMPLANT
GLOVE BIOGEL PI IND STRL 8 (GLOVE) ×1 IMPLANT
GLOVE BIOGEL PI INDICATOR 8 (GLOVE) ×2
GLOVE ECLIPSE 8.0 STRL XLNG CF (GLOVE) ×3 IMPLANT
GLOVE SURG SS PI 7.0 STRL IVOR (GLOVE) ×3 IMPLANT
GOWN STRL REUS W/ TWL LRG LVL3 (GOWN DISPOSABLE) ×1 IMPLANT
GOWN STRL REUS W/ TWL XL LVL3 (GOWN DISPOSABLE) ×2 IMPLANT
GOWN STRL REUS W/TWL LRG LVL3 (GOWN DISPOSABLE) ×3
GOWN STRL REUS W/TWL XL LVL3 (GOWN DISPOSABLE) ×6
NDL SAFETY ECLIPSE 18X1.5 (NEEDLE) IMPLANT
NEEDLE HYPO 18GX1.5 SHARP (NEEDLE)
NEEDLE HYPO 25X1 1.5 SAFETY (NEEDLE) ×3 IMPLANT
NS IRRIG 1000ML POUR BTL (IV SOLUTION) ×3 IMPLANT
PACK BASIN DAY SURGERY FS (CUSTOM PROCEDURE TRAY) ×3 IMPLANT
PAD CAST 4YDX4 CTTN HI CHSV (CAST SUPPLIES) ×1 IMPLANT
PADDING CAST COTTON 4X4 STRL (CAST SUPPLIES) ×3
PENCIL SMOKE EVACUATOR (MISCELLANEOUS) ×3 IMPLANT
SANITIZER HAND PURELL 535ML FO (MISCELLANEOUS) ×3 IMPLANT
SHEET MEDIUM DRAPE 40X70 STRL (DRAPES) ×3 IMPLANT
SLEEVE SCD COMPRESS KNEE MED (MISCELLANEOUS) ×3 IMPLANT
SPONGE LAP 18X18 RF (DISPOSABLE) ×3 IMPLANT
STOCKINETTE 6  STRL (DRAPES) ×3
STOCKINETTE 6 STRL (DRAPES) ×1 IMPLANT
SUCTION FRAZIER HANDLE 10FR (MISCELLANEOUS) ×3
SUCTION TUBE FRAZIER 10FR DISP (MISCELLANEOUS) ×1 IMPLANT
SUT ETHILON 2 0 FS 18 (SUTURE) ×6 IMPLANT
SUT ETHILON 2 0 FSLX (SUTURE) IMPLANT
SUT ETHILON 3 0 PS 1 (SUTURE) IMPLANT
SUT MNCRL AB 3-0 PS2 18 (SUTURE) IMPLANT
SWAB COLLECTION DEVICE MRSA (MISCELLANEOUS) IMPLANT
SWAB CULTURE ESWAB REG 1ML (MISCELLANEOUS) IMPLANT
SYR BULB EAR ULCER 3OZ GRN STR (SYRINGE) ×3 IMPLANT
SYR CONTROL 10ML LL (SYRINGE) ×3 IMPLANT
TOWEL GREEN STERILE FF (TOWEL DISPOSABLE) ×3 IMPLANT
TUBE CONNECTING 20'X1/4 (TUBING) ×1
TUBE CONNECTING 20X1/4 (TUBING) ×2 IMPLANT
UNDERPAD 30X36 HEAVY ABSORB (UNDERPADS AND DIAPERS) ×3 IMPLANT

## 2020-05-11 NOTE — Anesthesia Postprocedure Evaluation (Signed)
Anesthesia Post Note  Patient: Dwayne Rocha  Procedure(s) Performed: Right hallux amputation (Right Toe)     Patient location during evaluation: PACU Anesthesia Type: MAC Level of consciousness: awake and alert Pain management: pain level controlled Vital Signs Assessment: post-procedure vital signs reviewed and stable Respiratory status: spontaneous breathing, nonlabored ventilation, respiratory function stable and patient connected to nasal cannula oxygen Cardiovascular status: stable and blood pressure returned to baseline Postop Assessment: no apparent nausea or vomiting Anesthetic complications: no   No complications documented.  Last Vitals:  Vitals:   05/11/20 1415 05/11/20 1442  BP: 121/66 123/69  Pulse: 82 67  Resp: 20 16  Temp:  36.6 C  SpO2: 100% 100%    Last Pain:  Vitals:   05/11/20 1436  TempSrc:   PainSc: 7                  Alyra Patty P Reatha Sur

## 2020-05-11 NOTE — Transfer of Care (Signed)
Immediate Anesthesia Transfer of Care Note  Patient: Rainey Pines  Procedure(s) Performed: Right hallux amputation (Right Toe)  Patient Location: PACU  Anesthesia Type:MAC  Level of Consciousness: awake, alert  and oriented  Airway & Oxygen Therapy: Patient Spontanous Breathing and Patient connected to face mask oxygen  Post-op Assessment: Report given to RN and Post -op Vital signs reviewed and stable  Post vital signs: Reviewed and stable  Last Vitals:  Vitals Value Taken Time  BP    Temp 36.6 C 05/11/20 1400  Pulse 95 05/11/20 1401  Resp 21 05/11/20 1401  SpO2 100 % 05/11/20 1401  Vitals shown include unvalidated device data.  Last Pain:  Vitals:   05/11/20 1213  TempSrc: Oral  PainSc: 7       Patients Stated Pain Goal: 3 (23/55/73 2202)  Complications: No complications documented.

## 2020-05-11 NOTE — Discharge Instructions (Addendum)
If you begin to have bleeding from your toe again, I recommend holding direct pressure for 15 to 20 minutes and keeping your foot elevated above the level of your heart.  If this does not stop your bleeding, please return to the emergency department.  I recommend follow-up with Dr. Victorino Dike as scheduled.

## 2020-05-11 NOTE — ED Notes (Signed)
Ambulated with no assistance. No bleeding or oozing noted from wound.

## 2020-05-11 NOTE — ED Notes (Signed)
TXA removed. No active bleeding/oozing at this time. Will recheck in 15 min.

## 2020-05-11 NOTE — ED Notes (Signed)
Verbalized understanding of DC instructions and follow up care 

## 2020-05-11 NOTE — H&P (Signed)
Dwayne Rocha is an 44 y.o. male.   Chief Complaint: Chronic right hallux ulcer   HPI: Dwayne Rocha is a 44 year old male with a past medical history significant for diabetes.  He has a history of a plantar ulcer that became infected several weeks ago.  He saw Dr. Algis Liming in infectious disease.  He was referred for further evaluation.  An MRI shows osteomyelitis of the distal phalanx.  He presents now for surgical treatment.   Past Medical History:  Diagnosis Date  . Anxiety   . Complication of anesthesia    difficult to wake up last 2 procedures  . Depression   . Diabetes mellitus   . 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 Hyperlipidemia   . Hypertension   . Onychomycosis 05/03/2020  . Osteomyelitis of great toe of right foot (HCC) 05/03/2020    Past Surgical History:  Procedure Laterality Date  . ELBOW SURGERY    . HIP SURGERY     "pins in hips"-"growth plate slipped"  . NECK SURGERY    . WRIST SURGERY      Family History  Problem Relation Age of Onset  . Hypertension Mother   . Diabetes Mother   . Stroke Maternal Grandmother   . Heart attack Maternal Grandfather   . Stroke Paternal Grandmother   . Stroke Paternal Grandfather   . Diabetes Brother   . Hypertension Brother    Social History:  reports that he quit smoking about 4 years ago. His smoking use included cigarettes. He has a 6.00 pack-year smoking history. He has never used smokeless tobacco. He reports current alcohol use. He reports that he does not use drugs.  Allergies:  Allergies  Allergen Reactions  . Lyrica [Pregabalin] Swelling    Medications Prior to Admission  Medication Sig Dispense Refill  . amLODipine (NORVASC) 10 MG tablet Take 10 mg by mouth daily.    Marland Kitchen amoxicillin-clavulanate (AUGMENTIN) 875-125 MG tablet Take 1 tablet by mouth 2 (two) times daily. 60 tablet 0  . aspirin EC 81 MG tablet Take 1 tablet (81 mg total) by mouth daily. 30 tablet 3  . ciprofloxacin (CIPRO) 500 MG  tablet Take 1 tablet (500 mg total) by mouth 2 (two) times daily. 20 tablet 0  . Continuous Blood Gluc Sensor (FREESTYLE LIBRE 14 DAY SENSOR) MISC Apply topically 3 (three) times daily.    . furosemide (LASIX) 20 MG tablet Take 20 mg by mouth daily as needed for edema.    Marland Kitchen HUMALOG MIX 75/25 (75-25) 100 UNIT/ML SUSP injection Inject 20 Units into the skin 2 (two) times daily.    . hydrochlorothiazide (HYDRODIURIL) 25 MG tablet Take 25 mg by mouth daily.    Marland Kitchen ibuprofen (ADVIL) 800 MG tablet Take 800 mg by mouth 3 (three) times daily as needed.    . insulin lispro (HUMALOG KWIKPEN) 100 UNIT/ML KwikPen Inject 2-10 Units into the skin 3 (three) times daily before meals. Per sliding scale not given, using as needed    . Insulin Syringe-Needle U-100 (INSULIN SYRINGE 1CC/31GX5/16") 31G X 5/16" 1 ML MISC SMARTSIG:Injection Twice Daily    . lisinopril (ZESTRIL) 40 MG tablet Take 40 mg by mouth daily.    . metFORMIN (GLUCOPHAGE) 500 MG tablet Take 500 mg by mouth 2 (two) times daily.    . nitroGLYCERIN (NITROSTAT) 0.4 MG SL tablet Place 1 tablet (0.4 mg total) under the tongue every 5 (five) minutes as needed for chest pain. 30 tablet 3  . pantoprazole (PROTONIX) 40  MG tablet Take 40 mg by mouth 2 (two) times daily.    Marland Kitchen SANTYL ointment APPLY NICKEL THICK DAILY TO THE WOUND BEDTIME AND THEN COVER WITH A DRESSING AS DIRECTED IN CLINIC    . simvastatin (ZOCOR) 10 MG tablet Take 10 mg by mouth at bedtime.      Results for orders placed or performed during the hospital encounter of 2020-06-03 (from the past 48 hour(s))  Glucose, capillary     Status: Abnormal   Collection Time: 2020/06/03 12:22 PM  Result Value Ref Range   Glucose-Capillary 107 (H) 70 - 99 mg/dL    Comment: Glucose reference range applies only to samples taken after fasting for at least 8 hours.   No results found.  Review of Systems no recent fever, chills, nausea, vomiting or changes in his appetite  Blood pressure (!) 134/93, pulse 100,  temperature 98.7 F (37.1 C), temperature source Oral, height 5\' 11"  (1.803 m), weight (!) 151.7 kg, SpO2 100 %. Physical Exam  Well-nourished well-developed man in no apparent distress.  Alert and oriented x4.  Normal mood and affect.  The right hallux has some swelling and an ulcer distally.  No erythema, warmth or tenderness.  Diminished sensibility to light touch at the forefoot dorsally and plantarly.   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  The risks and benefits of the alternative treatment options have been discussed in detail.  The patient wishes to proceed with surgery and specifically understands risks of bleeding, infection, nerve damage, blood clots, need for additional surgery, amputation and death.   , MD 06-03-20, 1:01 PM

## 2020-05-11 NOTE — Progress Notes (Signed)
Phone called received from patient that was discharged about 45 minutes ago, stating now that he has significant bleeding from the foot, thru the dressing. Dr. Victorino Dike gone for the day from the surgical center, advised patient to go to ER.

## 2020-05-11 NOTE — Op Note (Signed)
05/11/2020  1:58 PM  PATIENT:  Dwayne Rocha  44 y.o. 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  INDICATION FOR PROCEDURE: The patient is a 44 year old male with a past medical history significant for diabetes.  He has a chronic right hallux ulcer and has developed underlying infection of the distal phalanx.  An MRI reveals osteomyelitis of the distal phalanx.  The proximal phalanx appears spared.  There is no abscess on MRI.  He presents now for amputation of the right hallux.  He understands the risks and benefits of the alternative treatment options and elect surgical treatment.  He specifically understands risks of bleeding, infection, nerve damage, blood clots, continued pain, revision amputation and death.  PROCEDURE IN DETAIL: After preoperative consent was obtained the correct operative site was identified, the patient was brought to the operating room and placed supine on the operating table.  Preoperative antibiotics were administered.  A surgical timeout was taken.  The right lower extremity was prepped and draped in standard sterile fashion.  The foot was exsanguinated and a 4 inch Esmarch tourniquet wrapped around the ankle.  A fishmouth incision was marked on the skin at the hallux adjacent to the IP joint.  Dissection was carried sharply down through the skin and subcutaneous tissues and then subperiosteally to the level of the IP joint.  The toe was disarticulated at the IP joint and passed off the field.  The head of the proximal phalanx was then exposed and resected with the oscillating saw.  The wound was irrigated copiously.   Remaining soft tissues all appeared healthy.  The soft tissue flaps were contoured sharply.  Neurovascular bundles were cauterized.  Vancomycin powder was sprinkled in the wound.  The incision was closed with horizontal mattress sutures of 2-0 nylon.  Sterile dressings were applied followed by a compression wrap.  The tourniquet was released after application of the dressings.  The patient was awakened from anesthesia and transported to the recovery room in stable condition.  FOLLOW UP PLAN: Weightbearing as tolerated in a flat postop shoe.  Follow-up in 2 weeks for suture removal and wound check.  Continue oral antibiotics per Dr. Tommy Medal.

## 2020-05-11 NOTE — Anesthesia Preprocedure Evaluation (Addendum)
Anesthesia Evaluation  Patient identified by MRN, date of birth, ID band Patient awake    Reviewed: Allergy & Precautions, NPO status , Patient's Chart, lab work & pertinent test results  Airway Mallampati: III  TM Distance: >3 FB Neck ROM: Full    Dental no notable dental hx.    Pulmonary former smoker,    Pulmonary exam normal breath sounds clear to auscultation       Cardiovascular hypertension, Pt. on medications Normal cardiovascular exam Rhythm:Regular Rate:Normal  Exercise nuclear stress test was performed using Bruce protocol. Patient reached 4.6 METS, and 89% of age predicted maximum heart rate. Exercise capacity was low. Chest pain not reported. Heart rate and hemodynamic response were normal. Stress EKG revealed no ischemic changes. SPECT images show mild decrease in basal inferior tracer uptake likely due to diaphragmatic attenuation. Stress LVEF 74%. Low risk study.  ECHO: 1. Normal LV systolic function with visual EF 55-60%. Left ventricle cavity is normal in size. Mild left ventricular hypertrophy. Normal global wall motion. Normal diastolic filling pattern, normal LAP. 2. Mild (Grade I) aortic regurgitation. 3. No other significant valvular abnormalities. 4. No prior study for comparison.  ECG: SR, rate 92   Neuro/Psych PSYCHIATRIC DISORDERS Anxiety Depression negative neurological ROS     GI/Hepatic negative GI ROS, Neg liver ROS,   Endo/Other  diabetes, Insulin Dependent, Oral Hypoglycemic AgentsMorbid obesity  Renal/GU Renal InsufficiencyRenal disease     Musculoskeletal negative musculoskeletal ROS (+)   Abdominal (+) + obese,   Peds  Hematology  (+) anemia , HLD   Anesthesia Other Findings right foot osteomyelitis  Reproductive/Obstetrics                            Anesthesia Physical Anesthesia Plan  ASA: III  Anesthesia Plan: MAC   Post-op Pain Management:     Induction: Intravenous  PONV Risk Score and Plan: 1 and Ondansetron, Midazolam and Treatment may vary due to age or medical condition  Airway Management Planned: Simple Face Mask  Additional Equipment:   Intra-op Plan:   Post-operative Plan:   Informed Consent: I have reviewed the patients History and Physical, chart, labs and discussed the procedure including the risks, benefits and alternatives for the proposed anesthesia with the patient or authorized representative who has indicated his/her understanding and acceptance.     Dental advisory given  Plan Discussed with: CRNA  Anesthesia Plan Comments:        Anesthesia Quick Evaluation

## 2020-05-11 NOTE — ED Triage Notes (Signed)
Pt had big toe amputated today and bleeding has gone through the bandage. Denies any pain.

## 2020-05-11 NOTE — ED Provider Notes (Signed)
TIME SEEN: 8:46 PM  CHIEF COMPLAINT: Postoperative bleeding  HPI: Patient underwent amputation of the right great toe today with Dr. Victorino Dike.  Reports he was discharged at 1:30 PM.  States that he has had bleeding through his dressing.  No pain.  He is on aspirin but no anticoagulant.  Reports the bandage was completely soaked.  He called Dr. Laverta Baltimore office without answer and left a message.  Per operative note, patient underwent right hallux amputation at the IP joint due to osteomyelitis and a chronic diabetic ulcer.  ROS: See HPI Constitutional: no fever  Eyes: no drainage  ENT: no runny nose   Cardiovascular:  no chest pain  Resp: no SOB  GI: no vomiting GU: no dysuria Integumentary: no rash  Allergy: no hives  Musculoskeletal: no leg swelling  Neurological: no slurred speech ROS otherwise negative  PAST MEDICAL HISTORY/PAST SURGICAL HISTORY:  Past Medical History:  Diagnosis Date  . Anxiety   . Complication of anesthesia    difficult to wake up last 2 procedures  . Depression   . Diabetes mellitus   . 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 Hyperlipidemia   . Hypertension   . Onychomycosis 05/03/2020  . Osteomyelitis of great toe of right foot (HCC) 05/03/2020    MEDICATIONS:  Prior to Admission medications   Medication Sig Start Date End Date Taking? Authorizing Provider  amLODipine (NORVASC) 10 MG tablet Take 10 mg by mouth daily. 12/09/19   [provider]  amoxicillin-clavulanate (AUGMENTIN) 875-125 MG tablet Take 1 tablet by mouth 2 (two) times daily. 05/03/20   Randall Hiss, MD  aspirin EC 81 MG tablet Take 1 tablet (81 mg total) by mouth daily. 02/24/20   Patwardhan, Anabel Bene, MD  ciprofloxacin (CIPRO) 500 MG tablet Take 1 tablet (500 mg total) by mouth 2 (two) times daily. 05/03/20   Randall Hiss, MD  Continuous Blood Gluc Sensor (FREESTYLE LIBRE 14 DAY SENSOR) MISC Apply topically 3 (three) times daily. 04/24/20   [provider]  furosemide (LASIX) 20 MG tablet Take 20 mg by mouth daily as needed for edema. 01/17/20   [provider]  HUMALOG MIX 75/25 (75-25) 100 UNIT/ML SUSP injection Inject 20 Units into the skin 2 (two) times daily. 01/18/20   [provider]  hydrochlorothiazide (HYDRODIURIL) 25 MG tablet Take 25 mg by mouth daily. 02/04/20   [provider]  ibuprofen (ADVIL) 800 MG tablet Take 800 mg by mouth 3 (three) times daily as needed. 04/29/20   [provider]  insulin lispro (HUMALOG KWIKPEN) 100 UNIT/ML KwikPen Inject 2-10 Units into the skin 3 (three) times daily before meals. Per sliding scale not given, using as needed    [provider]  Insulin Syringe-Needle U-100 (INSULIN SYRINGE 1CC/31GX5/16") 31G X 5/16" 1 ML MISC SMARTSIG:Injection Twice Daily 04/27/20   [provider]  lisinopril (ZESTRIL) 40 MG tablet Take 40 mg by mouth daily. 11/10/19   [provider]  metFORMIN (GLUCOPHAGE) 500 MG tablet Take 500 mg by mouth 2 (two) times daily. 01/10/20   [provider]  nitroGLYCERIN (NITROSTAT) 0.4 MG SL tablet Place 1 tablet (0.4 mg total) under the tongue every 5 (five) minutes as needed for chest pain. 02/24/20 05/24/20  Patwardhan, Anabel Bene, MD  pantoprazole (PROTONIX) 40 MG tablet Take 40 mg by mouth 2 (two) times daily. 01/10/20   [provider]  SANTYL ointment APPLY NICKEL THICK DAILY TO THE WOUND BEDTIME AND THEN  COVER WITH A DRESSING AS DIRECTED IN CLINIC 04/13/20   [provider]  simvastatin (ZOCOR) 10 MG tablet Take 10 mg by mouth at bedtime. 12/10/19   [provider]    ALLERGIES:  Allergies  Allergen Reactions  . Lyrica [Pregabalin] Swelling    SOCIAL HISTORY:  Social History   Tobacco Use  . Smoking status: Former Smoker    Packs/day: 0.50    Years: 12.00    Pack years: 6.00    Types: Cigarettes    Quit date: 2017    Years since quitting: 4.4  . Smokeless tobacco: Never  Used  Substance Use Topics  . Alcohol use: Yes    Comment: Socially     FAMILY HISTORY: Family History  Problem Relation Age of Onset  . Hypertension Mother   . Diabetes Mother   . Stroke Maternal Grandmother   . Heart attack Maternal Grandfather   . Stroke Paternal Grandmother   . Stroke Paternal Grandfather   . Diabetes Brother   . Hypertension Brother     EXAM: BP 129/85 (BP Location: Left Arm)   Pulse 75   Temp 98.2 F (36.8 C) (Oral)   Resp 16   Ht 5\' 11"  (1.803 m)   Wt (!) 152 kg   SpO2 100%   BMI 46.72 kg/m  CONSTITUTIONAL: Alert and responds appropriately to questions. Well-appearing; well-nourished HEAD: Normocephalic, atraumatic EYES: Conjunctivae clear, pupils appear equal ENT: normal nose; moist mucous membranes NECK: Normal range of motion CARD: Regular rate and rhythm RESP: Normal chest excursion without splinting or tachypnea; no hypoxia or respiratory distress, speaking full sentences ABD/GI: non-distended EXT: Normal ROM in all joints, no major deformities noted; status post right great toe, sutures intact, small amount of dark red blood oozing from the wound, no purulent drainage, 2+ right DP pulse, compartments in the right lower extremity are soft, no joint effusion, no redness or warmth SKIN: Normal color for age and race, no rashes on exposed skin NEURO: Moves all extremities equally, normal speech, no facial asymmetry noted PSYCH: The patient's mood and manner are appropriate. Grooming and personal hygiene are appropriate.  MEDICAL DECISION MAKING: Patient here with postoperative bleeding.  Currently wound is only oozing a small amount of blood.  He appears to be neurovascular intact distally.  No sign of superimposed infection.  Will apply TXA soaked gauze and pressure dressing and reassess.  ED PROGRESS: Bleeding has stopped after TXA and pressure dressing.  Dressing removed with no further bleeding.  Able to ambulate and sit upright with his foot  hanging over the edge of the bed without any further bleeding.  Will replace dressing and discharge home.   At this time, I do not feel there is any life-threatening condition present. I have reviewed, interpreted and discussed all results (EKG, imaging, lab, urine as appropriate) and exam findings with patient/family. I have reviewed nursing notes and appropriate previous records.  I feel the patient is safe to be discharged home without further emergent workup and can continue workup as an outpatient as needed. Discussed usual and customary return precautions. Patient/family verbalize understanding and are comfortable with this plan.  Outpatient follow-up has been provided as needed. All questions have been answered.   Dwayne Rocha was evaluated in Emergency Department on 05/11/2020 for the symptoms described in the history of present illness. He was evaluated in the context of the global COVID-19 pandemic, which necessitated consideration that the patient might be at risk for infection with the SARS-CoV-2  virus that causes COVID-19. Institutional protocols and algorithms that pertain to the evaluation of patients at risk for COVID-19 are in a state of rapid change based on information released by regulatory bodies including the CDC and federal and state organizations. These policies and algorithms were followed during the patient's care in the ED.      Deloma Spindle, Layla Maw, DO 05/11/20 2220

## 2020-05-11 NOTE — Discharge Instructions (Addendum)
Next Tylenol at 6:08 PM   Toni Arthurs, MD [facility]  Please read the following information regarding your care after surgery.  Medications  You only need a prescription for the narcotic pain medicine (ex. oxycodone, Percocet, Norco).  All of the other medicines listed below are available over the counter. ? Aleve 2 pills twice a day for the first 3 days after surgery. X acetominophen (Tylenol) 650 mg every 4-6 hours as you need for minor to moderate pain ? oxycodone as prescribed for severe pain  Narcotic pain medicine (ex. oxycodone, Percocet, Vicodin) will cause constipation.  To prevent this problem, take the following medicines while you are taking any pain medicine. ? docusate sodium (Colace) 100 mg twice a day ? senna (Senokot) 2 tablets twice a day  ? To help prevent blood clots, take a baby aspirin (81 mg) twice a day for two weeks after surgery.  You should also get up every hour while you are awake to move around.    Weight Bearing ? Bear weight when you are able on your operated leg or foot. X Bear weight only on your operated foot in the post-op shoe. ? Do not bear any weight on the operated leg or foot.  Cast / Splint / Dressing X Keep your splint, cast or dressing clean and dry.  Don't put anything (coat hanger, pencil, etc) down inside of it.  If it gets damp, use a hair dryer on the cool setting to dry it.  If it gets soaked, call the office to schedule an appointment for a cast change. ? Remove your dressing 3 days after surgery and cover the incisions with dry dressings.    After your dressing, cast or splint is removed; you may shower, but do not soak or scrub the wound.  Allow the water to run over it, and then gently pat it dry.  Swelling It is normal for you to have swelling where you had surgery.  To reduce swelling and pain, keep your toes above your nose for at least 3 days after surgery.  It may be necessary to keep your foot or leg elevated for several  weeks.  If it hurts, it should be elevated.  Follow Up Call my office at [phone] when you are discharged from the hospital or surgery center to schedule an appointment to be seen two weeks after surgery.  Call my office at [phone] if you develop a fever >101.5 F, nausea, vomiting, bleeding from the surgical site or severe pain.     Post Anesthesia Home Care Instructions  Activity: Get plenty of rest for the remainder of the day. A responsible individual must stay with you for 24 hours following the procedure.  For the next 24 hours, DO NOT: -Drive a car -Advertising copywriter -Drink alcoholic beverages -Take any medication unless instructed by your physician -Make any legal decisions or sign important papers.  Meals: Start with liquid foods such as gelatin or soup. Progress to regular foods as tolerated. Avoid greasy, spicy, heavy foods. If nausea and/or vomiting occur, drink only clear liquids until the nausea and/or vomiting subsides. Call your physician if vomiting continues.  Special Instructions/Symptoms: Your throat may feel dry or sore from the anesthesia or the breathing tube placed in your throat during surgery. If this causes discomfort, gargle with warm salt water. The discomfort should disappear within 24 hours.  If you had a scopolamine patch placed behind your ear for the management of post- operative nausea and/or vomiting:  1. The medication in the patch is effective for 72 hours, after which it should be removed.  Wrap patch in a tissue and discard in the trash. Wash hands thoroughly with soap and water. 2. You may remove the patch earlier than 72 hours if you experience unpleasant side effects which may include dry mouth, dizziness or visual disturbances. 3. Avoid touching the patch. Wash your hands with soap and water after contact with the patch.

## 2020-05-12 ENCOUNTER — Encounter (HOSPITAL_BASED_OUTPATIENT_CLINIC_OR_DEPARTMENT_OTHER): Payer: Self-pay | Admitting: Orthopedic Surgery

## 2020-05-17 ENCOUNTER — Ambulatory Visit: Payer: Medicaid Other | Admitting: Cardiology

## 2020-06-19 ENCOUNTER — Ambulatory Visit: Payer: Medicaid Other | Admitting: Cardiology

## 2020-07-03 ENCOUNTER — Ambulatory Visit: Payer: 59 | Admitting: Cardiology

## 2020-07-03 ENCOUNTER — Other Ambulatory Visit: Payer: Self-pay

## 2020-07-03 ENCOUNTER — Encounter: Payer: Self-pay | Admitting: Cardiology

## 2020-07-03 VITALS — BP 150/92 | HR 97 | Resp 16 | Ht 71.0 in | Wt 339.0 lb

## 2020-07-03 DIAGNOSIS — R0789 Other chest pain: Secondary | ICD-10-CM

## 2020-07-03 DIAGNOSIS — I10 Essential (primary) hypertension: Secondary | ICD-10-CM

## 2020-07-03 DIAGNOSIS — E1165 Type 2 diabetes mellitus with hyperglycemia: Secondary | ICD-10-CM

## 2020-07-03 NOTE — Progress Notes (Signed)
Patient referred by [name], MD for chest pain  Subjective:   Dwayne Rocha, male    DOB: [birth year], 44 y.o.   MRN: [id number]   Chief Complaint  Patient presents with   Chest Pain   Follow-up    4-6 week     HPI  44 y.o. African American male with hypertension, type 2 DM, peripheral neuropathy, referred for chest pain.  Echocardiogram, stress test, carotid US results discussed with the patient, details below. Patient has not had any recurrence of chest pain. He recently underwent left foot big toe amputation due to infection.  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  His work includes a lot of walking and climbing, as well as lifting heavy objects.  He has noticed chest pain with physical activity at work, as well as back pain outside of work.  Initially started and sharp, right-sided pain, radiated across his chest, followed by retrosternal central chest tightness.  He does note pain at rest.  He denies any shortness of breath, orthopnea, PND.  He has occasional leg edema after using his pregabalin.  He is compliant with medical therapy.  Hypertension, diabetes, and lipids are well controlled.    Current Outpatient Medications on File Prior to Visit  Medication Sig Dispense Refill   amLODipine (NORVASC) 10 MG tablet Take 10 mg by mouth daily.     amoxicillin-clavulanate (AUGMENTIN) 875-125 MG tablet Take 1 tablet by mouth 2 (two) times daily. 60 tablet 0   aspirin EC 81 MG tablet Take 1 tablet (81 mg total) by mouth daily. (Patient not taking: Reported on 05/11/2020) 30 tablet 3   ciprofloxacin (CIPRO) 500 MG tablet Take 1 tablet (500 mg total) by mouth 2 (two) times daily. 20 tablet 0   Continuous Blood Gluc Sensor (FREESTYLE LIBRE 14 DAY SENSOR) MISC Apply topically 3 (three) times daily.     furosemide (LASIX) 20 MG tablet Take 20 mg by mouth daily as needed for edema.     HUMALOG MIX 75/25 (75-25)  100 UNIT/ML SUSP injection Inject 20 Units into the skin 2 (two) times daily.     hydrochlorothiazide (HYDRODIURIL) 25 MG tablet Take 25 mg by mouth daily.     ibuprofen (ADVIL) 800 MG tablet Take 800 mg by mouth 3 (three) times daily as needed for moderate pain.      insulin lispro (HUMALOG KWIKPEN) 100 UNIT/ML KwikPen Inject 2-10 Units into the skin 3 (three) times daily before meals. Per sliding scale not given, using as needed     Insulin Syringe-Needle U-100 (INSULIN SYRINGE 1CC/31GX5/16") 31G X 5/16" 1 ML MISC SMARTSIG:Injection Twice Daily     lisinopril (ZESTRIL) 40 MG tablet Take 40 mg by mouth daily.     metFORMIN (GLUCOPHAGE) 500 MG tablet Take 500 mg by mouth 2 (two) times daily.     nitroGLYCERIN (NITROSTAT) 0.4 MG SL tablet Place 1 tablet (0.4 mg total) under the tongue every 5 (five) minutes as needed for chest pain. 30 tablet 3   pantoprazole (PROTONIX) 40 MG tablet Take 40 mg by mouth 2 (two) times daily.     SANTYL ointment Apply 1 application topically at bedtime.      simvastatin (ZOCOR) 10 MG tablet Take 10 mg by mouth at bedtime.     No current facility-administered medications on file prior to visit.    Cardiovascular and other pertinent studies:  Exercise Myoview stress test 04/03/2020: Exercise nuclear stress test was performed  using Bruce protocol. Patient reached 4.6 METS, and 89% of age predicted maximum heart rate. Exercise capacity was low. Chest pain not reported. Heart rate and hemodynamic response were normal. Stress EKG revealed no ischemic changes. SPECT images show mild decrease in basal inferior tracer uptake likely due to diaphragmatic attenuation. Stress LVEF 74%. Low risk study.   Carotid artery duplex 03/15/2020:  No hemodynamically significant arterial disease in the internal carotid  artery bilaterally.  Right vertebral artery flow is not visualized. Antegrade left vertebral  artery flow.  Echocardiogram 03/15/2020:  Normal LV systolic  function with visual EF 55-60%. Left ventricle cavity  is normal in size. Mild left ventricular hypertrophy. Normal global wall  motion. Normal diastolic filling pattern, normal LAP.  Mild (Grade I) aortic regurgitation.  No other significant valvular abnormalities.  No prior study for comparison.  EKG 02/24/2020: Sinus rhythm 92 bpm. Normal EKG. Left atrial enalrgement.    Recent labs: 06/03/2019: Glucose 105, BUN/Cr 15/0.86. EGFR 108. Na/K 138/4.2. Rest of the CMP normal H/H 13.3/39.2. MCV 89.7. Platelets 218 HbA1C N/A Chol 139, TG 55, HDL 47, LDL 78 TSH 0.59 normal   Review of Systems  Cardiovascular: Positive for chest pain. Negative for dyspnea on exertion, leg swelling, palpitations and syncope.         Vitals:   07/03/20 1100  BP: (!) 150/92  Pulse: 97  Resp: 16  SpO2: 99%     Body mass index is 47.28 kg/m. Filed Weights   07/03/20 1100  Weight: (!) 339 lb (153.8 kg)     Objective:   Physical Exam Vitals and nursing note reviewed.  Constitutional:      General: He is not in acute distress.    Comments: Morbid obesity  Neck:     Vascular: No JVD.  Cardiovascular:     Rate and Rhythm: Normal rate and regular rhythm.     Pulses: Intact distal pulses.     Heart sounds: Normal heart sounds. No murmur heard.   Pulmonary:     Effort: Pulmonary effort is normal.     Breath sounds: Normal breath sounds. No wheezing or rales.  Musculoskeletal:     Left Lower Extremity: (Right foot first toe)         Assessment & Recommendations:   44 y.o. African American male with hypertension, type 2 DM, peripheral neuropathy, referred for chest pain.  Chest pain:  Now resolved. Diaphragmatic attenuation on stress test , no ischemia.  With his diabetes and absence of bleeding, recommend Aspirin 81 mg, at least every other day.  Hypertension: Blood pressure elevated, but usually normal. He  Has f/u w/PCP later this month. If BP remains >140/80 mmHg, consider  adding carvedilol.   F/u as needed.  [name], MD [facility] Pager: [phone] Office: [phone]

## 2020-08-03 ENCOUNTER — Ambulatory Visit (INDEPENDENT_AMBULATORY_CARE_PROVIDER_SITE_OTHER): Payer: 59 | Admitting: Sports Medicine

## 2020-08-03 ENCOUNTER — Encounter: Payer: Self-pay | Admitting: Sports Medicine

## 2020-08-03 ENCOUNTER — Ambulatory Visit (INDEPENDENT_AMBULATORY_CARE_PROVIDER_SITE_OTHER): Payer: 59

## 2020-08-03 ENCOUNTER — Other Ambulatory Visit: Payer: Self-pay

## 2020-08-03 DIAGNOSIS — M79672 Pain in left foot: Secondary | ICD-10-CM

## 2020-08-03 DIAGNOSIS — L601 Onycholysis: Secondary | ICD-10-CM

## 2020-08-03 DIAGNOSIS — L03032 Cellulitis of left toe: Secondary | ICD-10-CM

## 2020-08-03 DIAGNOSIS — L02612 Cutaneous abscess of left foot: Secondary | ICD-10-CM

## 2020-08-03 DIAGNOSIS — L089 Local infection of the skin and subcutaneous tissue, unspecified: Secondary | ICD-10-CM

## 2020-08-03 DIAGNOSIS — S90426A Blister (nonthermal), unspecified lesser toe(s), initial encounter: Secondary | ICD-10-CM

## 2020-08-03 DIAGNOSIS — L03031 Cellulitis of right toe: Secondary | ICD-10-CM

## 2020-08-03 DIAGNOSIS — S90829A Blister (nonthermal), unspecified foot, initial encounter: Secondary | ICD-10-CM

## 2020-08-03 DIAGNOSIS — S90425A Blister (nonthermal), left lesser toe(s), initial encounter: Secondary | ICD-10-CM

## 2020-08-03 DIAGNOSIS — L02611 Cutaneous abscess of right foot: Secondary | ICD-10-CM | POA: Diagnosis not present

## 2020-08-03 DIAGNOSIS — M79671 Pain in right foot: Secondary | ICD-10-CM

## 2020-08-03 DIAGNOSIS — L84 Corns and callosities: Secondary | ICD-10-CM

## 2020-08-03 DIAGNOSIS — S90821A Blister (nonthermal), right foot, initial encounter: Secondary | ICD-10-CM | POA: Diagnosis not present

## 2020-08-03 MED ORDER — AMOXICILLIN-POT CLAVULANATE 875-125 MG PO TABS
1.0000 | ORAL_TABLET | Freq: Two times a day (BID) | ORAL | 0 refills | Status: DC
Start: 2020-08-03 — End: 2021-02-15

## 2020-08-03 NOTE — Progress Notes (Signed)
Subjective: Dwayne Rocha is a 44 y.o. male patient seen in office for evaluation of blister to left 1st and 5th toes and right 2nd toe that started on 07/19/20 when he started back to work; reports that he wears work boot 12 hours and puts his diabetic insoles inside. Patient has a history of diabetes and a blood glucose level not recorded but last A1c ~6.   Patient is changing the dressing using antibiotic cream at home and reports that on Saturday. Denies nausea/fever/vomiting/chills/night sweats/shortness of breath/pain. Patient has no other pedal complaints at this time.  Patient Active Problem List   Diagnosis Date Noted  . Osteomyelitis of great toe of right foot (HCC) 05/03/2020  . 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 Onychomycosis 05/03/2020  . Atypical chest pain 02/24/2020  . Bilateral carotid bruits 02/24/2020  . Exertional chest pain 02/24/2020  . Severe episode of recurrent major depressive disorder, without psychotic features (HCC)   . MDD (major depressive disorder) 06/10/2016  . Uncontrolled type 2 diabetes mellitus with hyperglycemia (HCC) 10/02/2015  . Facial cellulitis 09/30/2015  . Anxiety 09/30/2015  . Essential hypertension 09/30/2015   Current Outpatient Medications on File Prior to Visit  Medication Sig Dispense Refill  . aspirin 81 MG EC tablet aspirin 81 mg tablet,delayed release   81 mg by oral route.    . ciprofloxacin (CIPRO) 500 MG tablet ciprofloxacin 500 mg tablet   500 mg by oral route.    Marland Kitchen amLODipine (NORVASC) 10 MG tablet Take 10 mg by mouth daily.    . Armodafinil 250 MG tablet Take by mouth.    . cephALEXin (KEFLEX) 500 MG capsule Take 500 mg by mouth 4 (four) times daily.    . Continuous Blood Gluc Sensor (FREESTYLE LIBRE 14 DAY SENSOR) MISC Apply topically 3 (three) times daily.    . furosemide (LASIX) 20 MG tablet Take 20 mg by mouth daily as needed for edema.    Marland Kitchen HUMALOG MIX 75/25 (75-25) 100 UNIT/ML SUSP injection Inject 20  Units into the skin 2 (two) times daily.    . hydrochlorothiazide (HYDRODIURIL) 25 MG tablet Take 25 mg by mouth daily.    Marland Kitchen ibuprofen (ADVIL) 800 MG tablet Take 800 mg by mouth 3 (three) times daily as needed for moderate pain.     Marland Kitchen insulin lispro (HUMALOG KWIKPEN) 100 UNIT/ML KwikPen Inject 2-10 Units into the skin 3 (three) times daily before meals. Per sliding scale not given, using as needed    . Insulin Syringe-Needle U-100 (INSULIN SYRINGE 1CC/31GX5/16") 31G X 5/16" 1 ML MISC SMARTSIG:Injection Twice Daily    . lisinopril (ZESTRIL) 40 MG tablet Take 40 mg by mouth daily.    . metFORMIN (GLUCOPHAGE) 500 MG tablet Take 500 mg by mouth 2 (two) times daily.    . nitroGLYCERIN (NITROSTAT) 0.4 MG SL tablet Place 1 tablet (0.4 mg total) under the tongue every 5 (five) minutes as needed for chest pain. 30 tablet 3  . pantoprazole (PROTONIX) 40 MG tablet Take 40 mg by mouth 2 (two) times daily.    . simvastatin (ZOCOR) 10 MG tablet Take 10 mg by mouth at bedtime.     No current facility-administered medications on file prior to visit.   Allergies  Allergen Reactions  . Lyrica [Pregabalin] Swelling    Recent Results (from the past 2160 hour(s))  SARS CORONAVIRUS 2 (TAT 6-24 HRS) Nasopharyngeal Nasopharyngeal Swab     Status: None   Collection Time: 05/08/20 11:50 AM   Specimen:  Nasopharyngeal Swab  Result Value Ref Range   SARS Coronavirus 2 NEGATIVE NEGATIVE    Comment: (NOTE) SARS-CoV-2 target nucleic acids are NOT DETECTED.  The SARS-CoV-2 RNA is generally detectable in upper and lower respiratory specimens during the acute phase of infection. Negative results do not preclude SARS-CoV-2 infection, do not rule out co-infections with other pathogens, and should not be used as the sole basis for treatment or other patient management decisions. Negative results must be combined with clinical observations, patient history, and epidemiological information. The expected result is  Negative.  Fact Sheet for Patients: HairSlick.no  Fact Sheet for Healthcare Providers: quierodirigir.com  This test is not yet approved or cleared by the [place] FDA and  has been authorized for detection and/or diagnosis of SARS-CoV-2 by FDA under an Emergency Use Authorization (EUA). This EUA will remain  in effect (meaning this test can be used) for the duration of the COVID-19 declaration under Se ction 564(b)(1) of the Act, 21 U.S.C. section 360bbb-3(b)(1), unless the authorization is terminated or revoked sooner.  Performed at [facility], [address] [place], [place] [address]   Glucose, capillary     Status: Abnormal   Collection Time: 05/11/20 12:22 PM  Result Value Ref Range   Glucose-Capillary 107 (H) 70 - 99 mg/dL    Comment: Glucose reference range applies only to samples taken after fasting for at least 8 hours.  Glucose, capillary     Status: Abnormal   Collection Time: 05/11/20  2:16 PM  Result Value Ref Range   Glucose-Capillary 108 (H) 70 - 99 mg/dL    Comment: Glucose reference range applies only to samples taken after fasting for at least 8 hours.    Objective: There were no vitals filed for this visit.  General: Patient is awake, alert, oriented x 3 and in no acute distress.  Dermatology: Skin is warm and dry bilateral with a blister of toes 1 and 5 on the left and 2nd toe on the right with significant maceration likely secondary over medication. There is lysis of left 5th toenail. There is no malodor, no active drainage, no erythema, no edema. No acute signs of infection.   Vascular: Dorsalis Pedis pulse =1 /4 Bilateral,  Posterior Tibial pulse = 0/4 Bilateral,  Capillary Fill Time < 5 seconds  Neurologic: Protective sensation absent bilateral.   Musculosketal: There is no pain to blistered areas. 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   Xrays, Left/Right foot:Amp status of right  great toe, No bony destruction suggestive of osteomyelitis. Choric charcot on left. No gas in soft tissues.   Recent Labs    04/17/20 1530  GRAMSTAIN RARE WBC PRESENT, PREDOMINANTLY PMN NO ORGANISMS SEEN Performed at [facility], [address] [place], [place] [address]   LABORGA ENTEROCOCCUS FAECALIS  PSEUDOMONAS AERUGINOSA    Assessment and Plan:  Problem List Items Addressed This Visit    None    Visit Diagnoses    Pain in left foot    -  Primary   Relevant Orders   DG Foot Complete Left   Pain in right foot       Relevant Orders   DG Foot Complete Right   Pre-ulcerative calluses       Infected blister of right foot, initial encounter       Relevant Medications   cephALEXin (KEFLEX) 500 MG capsule   Infected blister of fifth toe of left foot, initial encounter  Relevant Medications   cephALEXin (KEFLEX) 500 MG capsule   Infected blister of foot and toe       Relevant Medications   cephALEXin (KEFLEX) 500 MG capsule   Onycholysis           -Examined patient and discussed the progression of the wound and treatment alternatives. -Xrays reviewed - Excisionally dedbrided loose left 5th toenail and  blisters to healthy bleeding borders removing nonviable tissue using a sterile tissue nipper. Patient tolerated procedure well without any discomfort or anesthesia necessary for this wound debridement.  -Applied betadine  and dry sterile dressing and instructed patient to continue with daily dressings at home consisting of the same -Advised patient to refrain from wearing diabetic insoles with work boots due to rubbing -Rx Augmentin  - Advised patient to go to the ER or return to office if the wound worsens or if constitutional symptoms are present. -Patient to return to office in 2 weeks for follow up care and evaluation or sooner if problems arise.  Asencion Islam, DPM

## 2020-08-04 DIAGNOSIS — M79672 Pain in left foot: Secondary | ICD-10-CM | POA: Insufficient documentation

## 2020-08-17 ENCOUNTER — Ambulatory Visit (INDEPENDENT_AMBULATORY_CARE_PROVIDER_SITE_OTHER): Payer: 59 | Admitting: Sports Medicine

## 2020-08-17 ENCOUNTER — Other Ambulatory Visit: Payer: Self-pay

## 2020-08-17 DIAGNOSIS — L089 Local infection of the skin and subcutaneous tissue, unspecified: Secondary | ICD-10-CM

## 2020-08-17 DIAGNOSIS — M79671 Pain in right foot: Secondary | ICD-10-CM

## 2020-08-17 DIAGNOSIS — S90426A Blister (nonthermal), unspecified lesser toe(s), initial encounter: Secondary | ICD-10-CM

## 2020-08-17 DIAGNOSIS — M79672 Pain in left foot: Secondary | ICD-10-CM | POA: Diagnosis not present

## 2020-08-17 DIAGNOSIS — L84 Corns and callosities: Secondary | ICD-10-CM

## 2020-08-17 DIAGNOSIS — M86272 Subacute osteomyelitis, left ankle and foot: Secondary | ICD-10-CM

## 2020-08-17 DIAGNOSIS — S90829A Blister (nonthermal), unspecified foot, initial encounter: Secondary | ICD-10-CM

## 2020-08-17 DIAGNOSIS — M8620 Subacute osteomyelitis, unspecified site: Secondary | ICD-10-CM

## 2020-08-17 NOTE — Progress Notes (Signed)
Subjective: Delmer Kowalski is a 44 y.o. male patient seen in office for evaluation of blister to left 1st and 5th toes and right 2nd toe Left foot is casted reports that he went back to his original surgeon who said that his Charcot was changing and put him back into a cast on the left. Denies nausea/fever/vomiting/chills/night sweats/shortness of breath/pain. Patient has no other pedal complaints at this time.  Patient Active Problem List   Diagnosis Date Noted  . Osteomyelitis of great toe of right foot (HCC) 05/03/2020  . 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 Onychomycosis 05/03/2020  . Atypical chest pain 02/24/2020  . Bilateral carotid bruits 02/24/2020  . Exertional chest pain 02/24/2020  . Severe episode of recurrent major depressive disorder, without psychotic features (HCC)   . MDD (major depressive disorder) 06/10/2016  . Uncontrolled type 2 diabetes mellitus with hyperglycemia (HCC) 10/02/2015  . Facial cellulitis 09/30/2015  . Anxiety 09/30/2015  . Essential hypertension 09/30/2015   Current Outpatient Medications on File Prior to Visit  Medication Sig Dispense Refill  . amLODipine (NORVASC) 10 MG tablet Take 10 mg by mouth daily.    Marland Kitchen amoxicillin-clavulanate (AUGMENTIN) 875-125 MG tablet Take 1 tablet by mouth 2 (two) times daily. 28 tablet 0  . Armodafinil 250 MG tablet Take by mouth.    Marland Kitchen aspirin 81 MG EC tablet aspirin 81 mg tablet,delayed release   81 mg by oral route.    . cephALEXin (KEFLEX) 500 MG capsule Take 500 mg by mouth 4 (four) times daily.    . ciprofloxacin (CIPRO) 500 MG tablet ciprofloxacin 500 mg tablet   500 mg by oral route.    . Continuous Blood Gluc Sensor (FREESTYLE LIBRE 14 DAY SENSOR) MISC Apply topically 3 (three) times daily.    . furosemide (LASIX) 20 MG tablet Take 20 mg by mouth daily as needed for edema.    Marland Kitchen HUMALOG MIX 75/25 (75-25) 100 UNIT/ML SUSP injection Inject 20 Units into the skin 2 (two) times daily.    .  hydrochlorothiazide (HYDRODIURIL) 25 MG tablet Take 25 mg by mouth daily.    Marland Kitchen ibuprofen (ADVIL) 800 MG tablet Take 800 mg by mouth 3 (three) times daily as needed for moderate pain.     Marland Kitchen insulin lispro (HUMALOG KWIKPEN) 100 UNIT/ML KwikPen Inject 2-10 Units into the skin 3 (three) times daily before meals. Per sliding scale not given, using as needed    . Insulin Syringe-Needle U-100 (INSULIN SYRINGE 1CC/31GX5/16") 31G X 5/16" 1 ML MISC SMARTSIG:Injection Twice Daily    . lisinopril (ZESTRIL) 40 MG tablet Take 40 mg by mouth daily.    . metFORMIN (GLUCOPHAGE) 500 MG tablet Take 500 mg by mouth 2 (two) times daily.    . nitroGLYCERIN (NITROSTAT) 0.4 MG SL tablet Place 1 tablet (0.4 mg total) under the tongue every 5 (five) minutes as needed for chest pain. 30 tablet 3  . pantoprazole (PROTONIX) 40 MG tablet Take 40 mg by mouth 2 (two) times daily.    . simvastatin (ZOCOR) 10 MG tablet Take 10 mg by mouth at bedtime.     No current facility-administered medications on file prior to visit.   Allergies  Allergen Reactions  . Lyrica [Pregabalin] Swelling    No results found for this or any previous visit (from the past 2160 hour(s)).  Objective: There were no vitals filed for this visit.  General: Patient is awake, alert, oriented x 3 and in no acute distress.  Dermatology: Skin is  warm and dry bilateral with a dry blister of toes 1 and 5 on the left and 2nd toe on the right.  There is no malodor, no active drainage, no erythema, no edema. No other acute signs of infection.   Vascular: Dorsalis Pedis pulse =1 /4 Bilateral,  Posterior Tibial pulse = 0/4 Bilateral,  Capillary Fill Time < 5 seconds  Neurologic: Protective sensation absent bilateral.   Musculosketal: There is no pain to blistered/ulcerated areas. 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     Recent Labs    04/17/20 1530  GRAMSTAIN RARE WBC PRESENT, PREDOMINANTLY PMN NO ORGANISMS SEEN Performed at [facility]  [facility], [address], [place], [place] [address]   LABORGA ENTEROCOCCUS FAECALIS  PSEUDOMONAS AERUGINOSA    Assessment and Plan:  Problem List Items Addressed This Visit    None    Visit Diagnoses    Pain in left foot    -  Primary   Pain in right foot       Pre-ulcerative calluses       Infected blister of foot and toe          -Examined patient and discussed the progression of the wound and treatment alternatives. - Excisionally dedbrided lo blister skin to involve toes 2 healthy bleeding borders removing nonviable tissue using a sterile tissue nipper. Patient tolerated procedure well without any discomfort or anesthesia necessary for this wound debridement.  -Applied betadine  and dry sterile dressing and instructed patient to continue with daily dressings at home consisting of the same -Order bilateral CT scan to evaluate further for any concerns of osteomyelitis since patient is high risk with previous amputation history secondary to tell infection -Continue with cast as by orthopedic doctor on left and advised patient to continue with good supportive shoe on the right that avoids rubbing until he can be reassessed for new orthotics/modifications to his current orthotics to further offload the areas -Continue with Augmentin until completed - Advised patient to go to the ER or return to office if the wound worsens or if constitutional symptoms are present. -Patient to return to office after CT scan or in 2 weeks for follow-up wound care  Asencion Islam, DPM

## 2020-08-21 ENCOUNTER — Telehealth: Payer: Self-pay | Admitting: Podiatry

## 2020-08-21 NOTE — Telephone Encounter (Signed)
[facility] called about the CT w/o contrast and typically it is done with contrast for osteomyelitis. Reviewed chart and due to creatine would recommend holding off on contrast and proceed as ordered.

## 2020-09-01 ENCOUNTER — Other Ambulatory Visit: Payer: Self-pay | Admitting: Sports Medicine

## 2020-09-01 DIAGNOSIS — L02612 Cutaneous abscess of left foot: Secondary | ICD-10-CM

## 2020-09-01 DIAGNOSIS — L02611 Cutaneous abscess of right foot: Secondary | ICD-10-CM

## 2020-09-01 DIAGNOSIS — L03031 Cellulitis of right toe: Secondary | ICD-10-CM

## 2020-09-01 DIAGNOSIS — L03032 Cellulitis of left toe: Secondary | ICD-10-CM

## 2020-09-05 ENCOUNTER — Encounter: Payer: Self-pay | Admitting: Sports Medicine

## 2020-09-05 ENCOUNTER — Ambulatory Visit
Admission: RE | Admit: 2020-09-05 | Discharge: 2020-09-05 | Disposition: A | Discharge status: Home / Self Care | Payer: 59 | Source: Ambulatory Visit | Attending: Sports Medicine | Admitting: Sports Medicine

## 2020-09-05 ENCOUNTER — Other Ambulatory Visit: Payer: 59

## 2020-09-05 DIAGNOSIS — M86272 Subacute osteomyelitis, left ankle and foot: Secondary | ICD-10-CM

## 2020-09-07 ENCOUNTER — Other Ambulatory Visit: Payer: Self-pay

## 2020-09-07 ENCOUNTER — Ambulatory Visit (INDEPENDENT_AMBULATORY_CARE_PROVIDER_SITE_OTHER): Payer: 59 | Admitting: Sports Medicine

## 2020-09-07 ENCOUNTER — Encounter: Payer: Self-pay | Admitting: Sports Medicine

## 2020-09-07 DIAGNOSIS — M8620 Subacute osteomyelitis, unspecified site: Secondary | ICD-10-CM | POA: Diagnosis not present

## 2020-09-07 DIAGNOSIS — L84 Corns and callosities: Secondary | ICD-10-CM

## 2020-09-07 DIAGNOSIS — M79672 Pain in left foot: Secondary | ICD-10-CM | POA: Diagnosis not present

## 2020-09-07 DIAGNOSIS — M79671 Pain in right foot: Secondary | ICD-10-CM | POA: Diagnosis not present

## 2020-09-07 NOTE — Progress Notes (Signed)
Subjective: Dwayne Rocha is a 44 y.o. male patient seen in office for evaluation of blister to left 1st and 5th toes and right 2nd toe Left foot is casted reports by orthopedics for Charcot.  Denies nausea/fever/vomiting/chills/night sweats/shortness of breath/pain. Patient has no other pedal complaints at this time.  Patient Active Problem List   Diagnosis Date Noted  . Osteomyelitis of great toe of right foot (HCC) 05/03/2020  . 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 Onychomycosis 05/03/2020  . Atypical chest pain 02/24/2020  . Bilateral carotid bruits 02/24/2020  . Exertional chest pain 02/24/2020  . Severe episode of recurrent major depressive disorder, without psychotic features (HCC)   . MDD (major depressive disorder) 06/10/2016  . Uncontrolled type 2 diabetes mellitus with hyperglycemia (HCC) 10/02/2015  . Facial cellulitis 09/30/2015  . Anxiety 09/30/2015  . Essential hypertension 09/30/2015   Current Outpatient Medications on File Prior to Visit  Medication Sig Dispense Refill  . amLODipine (NORVASC) 10 MG tablet Take 10 mg by mouth daily.    Marland Kitchen amoxicillin-clavulanate (AUGMENTIN) 875-125 MG tablet Take 1 tablet by mouth 2 (two) times daily. 28 tablet 0  . Armodafinil 250 MG tablet Take by mouth.    Marland Kitchen aspirin 81 MG EC tablet aspirin 81 mg tablet,delayed release   81 mg by oral route.    . cephALEXin (KEFLEX) 500 MG capsule Take 500 mg by mouth 4 (four) times daily.    . ciprofloxacin (CIPRO) 500 MG tablet ciprofloxacin 500 mg tablet   500 mg by oral route.    . Continuous Blood Gluc Sensor (FREESTYLE LIBRE 14 DAY SENSOR) MISC Apply topically 3 (three) times daily.    . furosemide (LASIX) 20 MG tablet Take 20 mg by mouth daily as needed for edema.    Marland Kitchen HUMALOG MIX 75/25 (75-25) 100 UNIT/ML SUSP injection Inject 20 Units into the skin 2 (two) times daily.    . hydrochlorothiazide (HYDRODIURIL) 25 MG tablet Take 25 mg by mouth daily.    Marland Kitchen ibuprofen (ADVIL) 800  MG tablet Take 800 mg by mouth 3 (three) times daily as needed for moderate pain.     Marland Kitchen insulin lispro (HUMALOG KWIKPEN) 100 UNIT/ML KwikPen Inject 2-10 Units into the skin 3 (three) times daily before meals. Per sliding scale not given, using as needed    . Insulin Syringe-Needle U-100 (INSULIN SYRINGE 1CC/31GX5/16") 31G X 5/16" 1 ML MISC SMARTSIG:Injection Twice Daily    . lisinopril (ZESTRIL) 40 MG tablet Take 40 mg by mouth daily.    . metFORMIN (GLUCOPHAGE) 500 MG tablet Take 500 mg by mouth 2 (two) times daily.    . nitroGLYCERIN (NITROSTAT) 0.4 MG SL tablet Place 1 tablet (0.4 mg total) under the tongue every 5 (five) minutes as needed for chest pain. 30 tablet 3  . pantoprazole (PROTONIX) 40 MG tablet Take 40 mg by mouth 2 (two) times daily.    . simvastatin (ZOCOR) 10 MG tablet Take 10 mg by mouth at bedtime.     No current facility-administered medications on file prior to visit.   Allergies  Allergen Reactions  . Lyrica [Pregabalin] Swelling    No results found for this or any previous visit (from the past 2160 hour(s)).  Objective: There were no vitals filed for this visit.  General: Patient is awake, alert, oriented x 3 and in no acute distress.  Dermatology: Skin is warm and dry bilateral with a dry blister of toes right second toe the previous blister on the left has  resolved there is no malodor, no active drainage, no erythema, no edema. No other acute signs of infection.   Vascular: Dorsalis Pedis pulse =1 /4 Bilateral,  Posterior Tibial pulse = 0/4 Bilateral,  Capillary Fill Time < 5 seconds  Neurologic: Protective sensation absent bilateral.   Musculosketal: There is no pain to blistered/ulcerated areas. 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     Recent Labs    04/17/20 1530  GRAMSTAIN RARE WBC PRESENT, PREDOMINANTLY PMN NO ORGANISMS SEEN Performed at [facility], [address] [place], [place] [address]   LABORGA ENTEROCOCCUS FAECALIS   PSEUDOMONAS AERUGINOSA    Assessment and Plan:  Problem List Items Addressed This Visit    None    Visit Diagnoses    Pre-ulcerative calluses    -  Primary   Pain in right foot       Pain in left foot       Subacute osteomyelitis, unspecified site [facility])          -Examined patient and discussed the progression of the wound and treatment alternatives. - Excisionally dedbrided lo blister skin to involve right 2nd toe healthy bleeding borders removing nonviable tissue using a sterile tissue nipper. Patient tolerated procedure well without any discomfort or anesthesia necessary for this wound debridement.  -Applied betadine  and dry sterile dressing and instructed patient to continue with daily dressings at home consisting of the same for 1 more week right second toe -Awaiting CT for the right foot  -Continue with cast as by orthopedic doctor on left and advised patient to continue with good supportive shoe on the right that avoids rubbing until he can be reassessed for new orthotics/modifications to his current orthotics to further offload the areas like previous -Return next week for CT results on right.  Asencion Islam, DPM

## 2020-09-13 ENCOUNTER — Other Ambulatory Visit: Payer: Self-pay

## 2020-09-13 ENCOUNTER — Ambulatory Visit
Admission: RE | Admit: 2020-09-13 | Discharge: 2020-09-13 | Disposition: A | Discharge status: Home / Self Care | Payer: 59 | Source: Ambulatory Visit | Attending: Sports Medicine | Admitting: Sports Medicine

## 2020-09-13 DIAGNOSIS — M8620 Subacute osteomyelitis, unspecified site: Secondary | ICD-10-CM

## 2020-09-15 ENCOUNTER — Encounter: Payer: Self-pay | Admitting: Sports Medicine

## 2020-09-18 ENCOUNTER — Other Ambulatory Visit: Payer: Self-pay | Admitting: Surgery

## 2020-09-21 ENCOUNTER — Encounter: Payer: Self-pay | Admitting: Sports Medicine

## 2020-09-21 ENCOUNTER — Other Ambulatory Visit: Payer: Self-pay

## 2020-09-21 ENCOUNTER — Ambulatory Visit (INDEPENDENT_AMBULATORY_CARE_PROVIDER_SITE_OTHER): Payer: 59 | Admitting: Sports Medicine

## 2020-09-21 DIAGNOSIS — L84 Corns and callosities: Secondary | ICD-10-CM | POA: Diagnosis not present

## 2020-09-21 DIAGNOSIS — M79671 Pain in right foot: Secondary | ICD-10-CM

## 2020-09-21 DIAGNOSIS — M79672 Pain in left foot: Secondary | ICD-10-CM | POA: Diagnosis not present

## 2020-09-21 DIAGNOSIS — E0840 Diabetes mellitus due to underlying condition with diabetic neuropathy, unspecified: Secondary | ICD-10-CM

## 2020-09-21 DIAGNOSIS — E11610 Type 2 diabetes mellitus with diabetic neuropathic arthropathy: Secondary | ICD-10-CM

## 2020-09-21 NOTE — Progress Notes (Signed)
Subjective: Dwayne Rocha is a 44 y.o. male patient seen in office for discussion of CT results and for wound/toe check. Left foot is casted reports by orthopedics for Charcot and on the right has been applying betadine as instructed to the 2nd toe with no problems.  Denies nausea/fever/vomiting/chills/night sweats/shortness of breath/pain. Patient has no other pedal complaints at this time.  Patient Active Problem List   Diagnosis Date Noted   Osteomyelitis of great toe of right foot (HCC) 05/03/2020   Diabetes mellitus with neuropathy (HCC) 05/03/2020   Onychomycosis 05/03/2020   Atypical chest pain 02/24/2020   Bilateral carotid bruits 02/24/2020   Exertional chest pain 02/24/2020   Severe episode of recurrent major depressive disorder, without psychotic features (HCC)    MDD (major depressive disorder) 06/10/2016   Uncontrolled type 2 diabetes mellitus with hyperglycemia (HCC) 10/02/2015   Facial cellulitis 09/30/2015   Anxiety 09/30/2015   Essential hypertension 09/30/2015   Current Outpatient Medications on File Prior to Visit  Medication Sig Dispense Refill   amLODipine (NORVASC) 10 MG tablet Take 10 mg by mouth daily.     amoxicillin-clavulanate (AUGMENTIN) 875-125 MG tablet Take 1 tablet by mouth 2 (two) times daily. 28 tablet 0   Armodafinil 250 MG tablet Take by mouth.     aspirin 81 MG EC tablet aspirin 81 mg tablet,delayed release   81 mg by oral route.     cephALEXin (KEFLEX) 500 MG capsule Take 500 mg by mouth 4 (four) times daily.     ciprofloxacin (CIPRO) 500 MG tablet ciprofloxacin 500 mg tablet   500 mg by oral route.     Continuous Blood Gluc Sensor (FREESTYLE LIBRE 14 DAY SENSOR) MISC Apply topically 3 (three) times daily.     furosemide (LASIX) 20 MG tablet Take 20 mg by mouth daily as needed for edema.     HUMALOG MIX 75/25 (75-25) 100 UNIT/ML SUSP injection Inject 20 Units into the skin 2 (two) times daily.     hydrochlorothiazide  (HYDRODIURIL) 25 MG tablet Take 25 mg by mouth daily.     ibuprofen (ADVIL) 800 MG tablet Take 800 mg by mouth 3 (three) times daily as needed for moderate pain.      insulin lispro (HUMALOG KWIKPEN) 100 UNIT/ML KwikPen Inject 2-10 Units into the skin 3 (three) times daily before meals. Per sliding scale not given, using as needed     Insulin Syringe-Needle U-100 (INSULIN SYRINGE 1CC/31GX5/16") 31G X 5/16" 1 ML MISC SMARTSIG:Injection Twice Daily     lisinopril (ZESTRIL) 40 MG tablet Take 40 mg by mouth daily.     metFORMIN (GLUCOPHAGE) 500 MG tablet Take 500 mg by mouth 2 (two) times daily.     nitroGLYCERIN (NITROSTAT) 0.4 MG SL tablet Place 1 tablet (0.4 mg total) under the tongue every 5 (five) minutes as needed for chest pain. 30 tablet 3   pantoprazole (PROTONIX) 40 MG tablet Take 40 mg by mouth 2 (two) times daily.     simvastatin (ZOCOR) 10 MG tablet Take 10 mg by mouth at bedtime.     No current facility-administered medications on file prior to visit.   Allergies  Allergen Reactions   Lyrica [Pregabalin] Swelling    No results found for this or any previous visit (from the past 2160 hour(s)).  Objective: There were no vitals filed for this visit.  General: Patient is awake, alert, oriented x 3 and in no acute distress.  Dermatology: Skin is warm and dry bilateral with a healed wound/blister  at right 2nd toe, no active drainage, no erythema, no edema. No other acute signs of infection.   Vascular: Dorsalis Pedis pulse =1 /4 Bilateral,  Posterior Tibial pulse = 0/4 Bilateral,  Capillary Fill Time < 5 seconds  Neurologic: Protective sensation absent bilateral.   Musculosketal: There is no pain to palpation bilateral. 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     Recent Labs    04/17/20 1530  GRAMSTAIN RARE WBC PRESENT, PREDOMINANTLY PMN NO ORGANISMS SEEN Performed at [facility], [address] [place], [place] [address]   LABORGA ENTEROCOCCUS FAECALIS    PSEUDOMONAS AERUGINOSA    Assessment and Plan:  Problem List Items Addressed This Visit      Endocrine   Diabetes mellitus with neuropathy (HCC)    Other Visit Diagnoses    Pre-ulcerative calluses    -  Primary   Pain in right foot       Pain in left foot       Charcot foot due to diabetes mellitus (HCC)          -Examined patient  -Reviewed CT results: Negative for osteomyelitis at right and shows charcot on left -Mechanically debrided keratotic skin at right 2nd toe using a 15 blade without incident; Previous ulcer remains healed. -No dressing applied -Advised patient may continue with betadine 2x per week and leave open to air to protect the right 2nd toe -Continue with diabetic shoe with insole on right and cast on left -Return in 1 month for toe check since he will be going to work after his left cast is removed by ortho in 2 weeks  Asencion Islam, DPM

## 2020-09-25 ENCOUNTER — Other Ambulatory Visit: Payer: Self-pay

## 2020-09-25 ENCOUNTER — Ambulatory Visit (HOSPITAL_COMMUNITY)
Admission: RE | Admit: 2020-09-25 | Discharge: 2020-09-25 | Disposition: A | Discharge status: Home / Self Care | Payer: 59 | Source: Ambulatory Visit | Attending: Surgery | Admitting: Surgery

## 2020-10-17 ENCOUNTER — Ambulatory Visit: Payer: 59 | Admitting: Dietician

## 2020-10-19 ENCOUNTER — Ambulatory Visit: Payer: 59 | Admitting: Sports Medicine

## 2020-10-25 ENCOUNTER — Other Ambulatory Visit: Payer: Self-pay | Admitting: Cardiology

## 2020-10-31 ENCOUNTER — Ambulatory Visit: Payer: 59 | Admitting: Skilled Nursing Facility1

## 2020-12-07 ENCOUNTER — Ambulatory Visit: Payer: 59 | Admitting: Sports Medicine

## 2020-12-21 ENCOUNTER — Ambulatory Visit: Payer: 59 | Admitting: Sports Medicine

## 2021-01-04 ENCOUNTER — Other Ambulatory Visit: Payer: Self-pay | Admitting: Internal Medicine

## 2021-01-04 ENCOUNTER — Other Ambulatory Visit (HOSPITAL_COMMUNITY): Payer: Self-pay | Admitting: Internal Medicine

## 2021-01-04 DIAGNOSIS — R600 Localized edema: Secondary | ICD-10-CM

## 2021-01-05 ENCOUNTER — Ambulatory Visit (HOSPITAL_COMMUNITY)
Admission: RE | Admit: 2021-01-05 | Discharge: 2021-01-05 | Disposition: A | Discharge status: Home / Self Care | Payer: 59 | Source: Ambulatory Visit | Attending: Internal Medicine | Admitting: Internal Medicine

## 2021-01-05 ENCOUNTER — Other Ambulatory Visit: Payer: Self-pay

## 2021-01-05 DIAGNOSIS — R600 Localized edema: Secondary | ICD-10-CM | POA: Diagnosis not present

## 2021-01-05 LAB — BASIC METABOLIC PANEL WITH GFR
BUN/Creatinine Ratio: 15 (calc) (ref 6–22)
BUN: 25 mg/dL (ref 7–25)
CO2: 27 mmol/L (ref 20–32)
Calcium: 8.5 mg/dL — ABNORMAL LOW (ref 8.6–10.3)
Chloride: 105 mmol/L (ref 98–110)
Creat: 1.69 mg/dL — ABNORMAL HIGH (ref 0.60–1.35)
GFR, Est African American: 56 mL/min/{1.73_m2} — ABNORMAL LOW (ref >=60)
GFR, Est Non African American: 49 mL/min/{1.73_m2} — ABNORMAL LOW (ref >=60)
Glucose, Bld: 100 mg/dL — ABNORMAL HIGH (ref 65–99)
Potassium: 4.5 mmol/L (ref 3.5–5.3)
Sodium: 141 mmol/L (ref 135–146)

## 2021-01-05 LAB — EXTRA LAV TOP TUBE

## 2021-01-11 ENCOUNTER — Encounter (HOSPITAL_COMMUNITY): Payer: Self-pay

## 2021-01-29 ENCOUNTER — Other Ambulatory Visit (HOSPITAL_COMMUNITY): Payer: Self-pay | Admitting: Orthopedic Surgery

## 2021-02-12 ENCOUNTER — Encounter (INDEPENDENT_AMBULATORY_CARE_PROVIDER_SITE_OTHER): Payer: Self-pay

## 2021-02-12 ENCOUNTER — Other Ambulatory Visit (HOSPITAL_COMMUNITY)
Admission: RE | Admit: 2021-02-12 | Discharge: 2021-02-12 | Disposition: A | Discharge status: Home / Self Care | Payer: 59 | Source: Ambulatory Visit | Attending: Orthopedic Surgery | Admitting: Orthopedic Surgery

## 2021-02-12 DIAGNOSIS — Z01812 Encounter for preprocedural laboratory examination: Secondary | ICD-10-CM | POA: Insufficient documentation

## 2021-02-12 DIAGNOSIS — Z20822 Contact with and (suspected) exposure to covid-19: Secondary | ICD-10-CM | POA: Insufficient documentation

## 2021-02-12 LAB — SARS CORONAVIRUS 2 (TAT 6-24 HRS): SARS Coronavirus 2: NEGATIVE

## 2021-02-14 ENCOUNTER — Encounter (HOSPITAL_COMMUNITY): Payer: Self-pay | Admitting: Orthopedic Surgery

## 2021-02-14 NOTE — Anesthesia Preprocedure Evaluation (Addendum)
Anesthesia Evaluation  Patient identified by MRN, date of birth, ID band Patient awake    Reviewed: Allergy & Precautions, NPO status , Patient's Chart, lab work & pertinent test results  History of Anesthesia Complications Negative for: history of anesthetic complications  Airway Mallampati: II  TM Distance: >3 FB Neck ROM: Full    Dental  (+) Teeth Intact   Pulmonary neg pulmonary ROS, former smoker,    Pulmonary exam normal        Cardiovascular hypertension, Pt. on medications Normal cardiovascular exam     Neuro/Psych Anxiety Depression negative neurological ROS     GI/Hepatic Neg liver ROS, GERD  ,  Endo/Other  diabetes, Type 2, Insulin Dependent, Oral Hypoglycemic AgentsMorbid obesity  Renal/GU negative Renal ROS  negative genitourinary   Musculoskeletal   Abdominal   Peds  Hematology negative hematology ROS (+)   Anesthesia Other Findings  Exercise Myoview stress test 04/03/2020: Exercise nuclear stress test was performed using Bruce protocol. Patient  reached 4.6 METS, and 89% of age predicted maximum heart rate. Exercise  capacity was low. Chest pain not reported. Heart rate and hemodynamic  response were normal. Stress EKG revealed no ischemic changes. SPECT images show mild decrease in basal inferior tracer uptake likely due  to diaphragmatic attenuation. Stress LVEF 74%. Low risk study.   Reproductive/Obstetrics                            Anesthesia Physical Anesthesia Plan  ASA: III  Anesthesia Plan: General   Post-op Pain Management:    Induction: Intravenous  PONV Risk Score and Plan: 2 and Ondansetron, Dexamethasone, Midazolam and Treatment may vary due to age or medical condition  Airway Management Planned: LMA and Oral ETT  Additional Equipment: None  Intra-op Plan:   Post-operative Plan: Extubation in OR  Informed Consent: I have reviewed the patients  History and Physical, chart, labs and discussed the procedure including the risks, benefits and alternatives for the proposed anesthesia with the patient or authorized representative who has indicated his/her understanding and acceptance.       Plan Discussed with:   Anesthesia Plan Comments: (Attempted pre-op block but unable to obtain adequate ultrasound imaging. Will proceed with GA.)       Anesthesia Quick Evaluation

## 2021-02-14 NOTE — Progress Notes (Signed)
Patient denies shortness of breath, fever, cough or chest pain.  PCP - Dr Concepcion Elk Cardiologist - n/a  Chest x-ray - n/a EKG - 09/25/20 Stress Test - 04/03/20 ECHO - 03/15/20 Cardiac Cath - n/a  Fasting Blood Sugar - 80-160s Checks Blood Sugar 5 times a day  . Do not take metformin on the morning of surgery.  . Insulin Pump (Humalog Kwikpen) - reduce basal rate by 20% at midnight.  . If your CBG is greater than 220 mg/dL, you may take  of your sliding scale (correction) dose of insulin.  . If your blood sugar is less than 70 mg/dL, you will need to treat for low blood sugar: o Treat a low blood sugar (less than 70 mg/dL) with  cup of clear juice (cranberry or apple), 4 glucose tablets, OR glucose gel. o Recheck blood sugar in 15 minutes after treatment (to make sure it is greater than 70 mg/dL). If your blood sugar is not greater than 70 mg/dL on recheck, call [phone] for further instructions.  Aspirin Instructions: Follow your surgeon's instructions on when to stop aspirin prior to surgery,  If no instructions were given by your surgeon then you will need to call the office for those instructions.  STOP now taking any Aspirin (unless otherwise instructed by your surgeon), Aleve, Naproxen, Ibuprofen, Motrin, Advil, Goody's, BC's, all herbal medications, fish oil, and all vitamins.   Coronavirus Screening Covid test on 02/12/21 was negative.  Patient verbalized understanding of instructions that were given via phone.

## 2021-02-14 NOTE — Progress Notes (Addendum)
Paged Diabetes Coordinator, spoke with [place].  Informed her pf patient's Humalog Omnipod Pump.  Instructed patient to reduce basal rate by 20% at midnight tonight.  May need IV insulin if surgery is greater than 2 hours per [place].  Called Anesthesia, spoke with Dr Chaney Malling to inform him of patient's DM and BMI.

## 2021-02-15 ENCOUNTER — Inpatient Hospital Stay (HOSPITAL_COMMUNITY): Payer: 59 | Admitting: Physician Assistant

## 2021-02-15 ENCOUNTER — Inpatient Hospital Stay (HOSPITAL_COMMUNITY)
Admission: RE | Admit: 2021-02-15 | Discharge: 2021-02-16 | DRG: 493 | Disposition: A | Discharge status: Home / Self Care | Payer: 59 | Attending: Orthopedic Surgery | Admitting: Orthopedic Surgery

## 2021-02-15 ENCOUNTER — Inpatient Hospital Stay (HOSPITAL_COMMUNITY): Payer: 59

## 2021-02-15 ENCOUNTER — Encounter (HOSPITAL_COMMUNITY)
Admission: RE | Disposition: A | Discharge status: Home / Self Care | Payer: Self-pay | Source: Home / Self Care | Attending: Orthopedic Surgery

## 2021-02-15 ENCOUNTER — Other Ambulatory Visit: Payer: Self-pay

## 2021-02-15 ENCOUNTER — Encounter (HOSPITAL_COMMUNITY): Payer: Self-pay | Admitting: Orthopedic Surgery

## 2021-02-15 DIAGNOSIS — M14672 Charcot's joint, left ankle and foot: Principal | ICD-10-CM | POA: Diagnosis present

## 2021-02-15 DIAGNOSIS — Z7984 Long term (current) use of oral hypoglycemic drugs: Secondary | ICD-10-CM | POA: Diagnosis not present

## 2021-02-15 DIAGNOSIS — Z87891 Personal history of nicotine dependence: Secondary | ICD-10-CM | POA: Diagnosis not present

## 2021-02-15 DIAGNOSIS — I10 Essential (primary) hypertension: Secondary | ICD-10-CM | POA: Diagnosis present

## 2021-02-15 DIAGNOSIS — Z6841 Body Mass Index (BMI) 40.0 and over, adult: Secondary | ICD-10-CM | POA: Diagnosis not present

## 2021-02-15 DIAGNOSIS — Z888 Allergy status to other drugs, medicaments and biological substances status: Secondary | ICD-10-CM | POA: Diagnosis not present

## 2021-02-15 DIAGNOSIS — E785 Hyperlipidemia, unspecified: Secondary | ICD-10-CM | POA: Diagnosis present

## 2021-02-15 DIAGNOSIS — Z833 Family history of diabetes mellitus: Secondary | ICD-10-CM | POA: Diagnosis not present

## 2021-02-15 DIAGNOSIS — Z20822 Contact with and (suspected) exposure to covid-19: Secondary | ICD-10-CM | POA: Diagnosis present

## 2021-02-15 DIAGNOSIS — Z9641 Presence of insulin pump (external) (internal): Secondary | ICD-10-CM | POA: Diagnosis present

## 2021-02-15 DIAGNOSIS — F419 Anxiety disorder, unspecified: Secondary | ICD-10-CM | POA: Diagnosis present

## 2021-02-15 DIAGNOSIS — Z89421 Acquired absence of other right toe(s): Secondary | ICD-10-CM | POA: Diagnosis not present

## 2021-02-15 DIAGNOSIS — Z79899 Other long term (current) drug therapy: Secondary | ICD-10-CM

## 2021-02-15 DIAGNOSIS — K219 Gastro-esophageal reflux disease without esophagitis: Secondary | ICD-10-CM | POA: Diagnosis present

## 2021-02-15 DIAGNOSIS — Z8249 Family history of ischemic heart disease and other diseases of the circulatory system: Secondary | ICD-10-CM

## 2021-02-15 DIAGNOSIS — Z794 Long term (current) use of insulin: Secondary | ICD-10-CM

## 2021-02-15 DIAGNOSIS — E1140 Type 2 diabetes mellitus with diabetic neuropathy, unspecified: Secondary | ICD-10-CM | POA: Diagnosis present

## 2021-02-15 DIAGNOSIS — Z7982 Long term (current) use of aspirin: Secondary | ICD-10-CM

## 2021-02-15 DIAGNOSIS — E11610 Type 2 diabetes mellitus with diabetic neuropathic arthropathy: Secondary | ICD-10-CM | POA: Diagnosis present

## 2021-02-15 DIAGNOSIS — Z419 Encounter for procedure for purposes other than remedying health state, unspecified: Secondary | ICD-10-CM

## 2021-02-15 HISTORY — PX: FOOT ARTHRODESIS: SHX1655

## 2021-02-15 LAB — GLUCOSE, CAPILLARY
Glucose-Capillary: 119 mg/dL — ABNORMAL HIGH (ref 70–99)
Glucose-Capillary: 159 mg/dL — ABNORMAL HIGH (ref 70–99)
Glucose-Capillary: 213 mg/dL — ABNORMAL HIGH (ref 70–99)
Glucose-Capillary: 184 mg/dL — ABNORMAL HIGH (ref 70–99)
Glucose-Capillary: 176 mg/dL — ABNORMAL HIGH (ref 70–99)

## 2021-02-15 LAB — HEMOGLOBIN A1C
Hgb A1c MFr Bld: 6.8 % — ABNORMAL HIGH (ref 4.8–5.6)
Mean Plasma Glucose: 148.46 mg/dL

## 2021-02-15 LAB — CBC
HCT: 31.7 % — ABNORMAL LOW (ref 39.0–52.0)
Hemoglobin: 9.9 g/dL — ABNORMAL LOW (ref 13.0–17.0)
MCH: 28.1 pg (ref 26.0–34.0)
MCHC: 31.2 g/dL (ref 30.0–36.0)
MCV: 90.1 fL (ref 80.0–100.0)
Platelets: 259 10*3/uL (ref 150–400)
RBC: 3.52 MIL/uL — ABNORMAL LOW (ref 4.22–5.81)
RDW: 15 % (ref 11.5–15.5)
WBC: 5.9 10*3/uL (ref 4.0–10.5)
nRBC: 0 % (ref 0.0–0.2)

## 2021-02-15 LAB — BASIC METABOLIC PANEL
Anion gap: 8 (ref 5–15)
BUN: 24 mg/dL — ABNORMAL HIGH (ref 6–20)
CO2: 24 mmol/L (ref 22–32)
Calcium: 8.5 mg/dL — ABNORMAL LOW (ref 8.9–10.3)
Chloride: 106 mmol/L (ref 98–111)
Creatinine, Ser: 1.77 mg/dL — ABNORMAL HIGH (ref 0.61–1.24)
GFR, Estimated: 48 mL/min — ABNORMAL LOW (ref >60)
Glucose, Bld: 125 mg/dL — ABNORMAL HIGH (ref 70–99)
Potassium: 3.7 mmol/L (ref 3.5–5.1)
Sodium: 138 mmol/L (ref 135–145)

## 2021-02-15 LAB — SURGICAL PCR SCREEN
MRSA, PCR: NEGATIVE
Staphylococcus aureus: NEGATIVE

## 2021-02-15 SURGERY — FUSION, JOINT, FOOT
Anesthesia: General | Site: Foot | Laterality: Left

## 2021-02-15 MED ORDER — ORAL CARE MOUTH RINSE: 15.0000 mL | Freq: Once | OROMUCOSAL | Status: AC

## 2021-02-15 MED ORDER — FENTANYL CITRATE (PF) 100 MCG/2ML IJ SOLN
25.0000 ug | INTRAMUSCULAR | Status: DC | PRN
  Administered 2021-02-15 (×2): 50 ug via INTRAVENOUS

## 2021-02-15 MED ORDER — VANCOMYCIN HCL 1000 MG IV SOLR
INTRAVENOUS | Status: AC
  Filled 2021-02-15: qty 1000

## 2021-02-15 MED ORDER — FUROSEMIDE 20 MG PO TABS: 20.0000 mg | ORAL_TABLET | Freq: Every day | ORAL | Status: DC | PRN

## 2021-02-15 MED ORDER — GABAPENTIN 400 MG PO CAPS
800.0000 mg | ORAL_CAPSULE | Freq: Three times a day (TID) | ORAL | Status: DC
  Administered 2021-02-15 – 2021-02-16 (×3): 800 mg via ORAL
  Filled 2021-02-15 (×3): qty 2

## 2021-02-15 MED ORDER — ONDANSETRON HCL 4 MG/2ML IJ SOLN: 4.0000 mg | Freq: Four times a day (QID) | INTRAMUSCULAR | Status: DC | PRN

## 2021-02-15 MED ORDER — LIDOCAINE 2% (20 MG/ML) 5 ML SYRINGE
INTRAMUSCULAR | Status: DC | PRN
  Administered 2021-02-15: 100 mg via INTRAVENOUS

## 2021-02-15 MED ORDER — ONDANSETRON HCL 4 MG/2ML IJ SOLN
INTRAMUSCULAR | Status: DC | PRN
  Administered 2021-02-15: 4 mg via INTRAVENOUS

## 2021-02-15 MED ORDER — CEFAZOLIN SODIUM-DEXTROSE 2-3 GM-%(50ML) IV SOLR
INTRAVENOUS | Status: DC | PRN
  Administered 2021-02-15: 3 g via INTRAVENOUS

## 2021-02-15 MED ORDER — EPHEDRINE SULFATE-NACL 50-0.9 MG/10ML-% IV SOSY
PREFILLED_SYRINGE | INTRAVENOUS | Status: DC | PRN
  Administered 2021-02-15: 10 mg via INTRAVENOUS
  Administered 2021-02-15 (×2): 5 mg via INTRAVENOUS

## 2021-02-15 MED ORDER — PROPOFOL 10 MG/ML IV BOLUS
INTRAVENOUS | Status: AC
  Filled 2021-02-15: qty 20

## 2021-02-15 MED ORDER — ENOXAPARIN SODIUM 40 MG/0.4ML ~~LOC~~ SOLN
40.0000 mg | SUBCUTANEOUS | Status: DC
  Administered 2021-02-16: 40 mg via SUBCUTANEOUS
  Filled 2021-02-15: qty 0.4

## 2021-02-15 MED ORDER — METHOCARBAMOL 500 MG PO TABS
500.0000 mg | ORAL_TABLET | Freq: Four times a day (QID) | ORAL | Status: DC | PRN
  Administered 2021-02-15 – 2021-02-16 (×4): 500 mg via ORAL
  Filled 2021-02-15 (×4): qty 1

## 2021-02-15 MED ORDER — SODIUM CHLORIDE 0.9 % IV SOLN: INTRAVENOUS | Status: DC

## 2021-02-15 MED ORDER — PANTOPRAZOLE SODIUM 40 MG PO TBEC
40.0000 mg | DELAYED_RELEASE_TABLET | Freq: Two times a day (BID) | ORAL | Status: DC
  Administered 2021-02-15 – 2021-02-16 (×3): 40 mg via ORAL
  Filled 2021-02-15 (×3): qty 1

## 2021-02-15 MED ORDER — PROPOFOL 10 MG/ML IV BOLUS
INTRAVENOUS | Status: DC | PRN
  Administered 2021-02-15: 200 mg via INTRAVENOUS

## 2021-02-15 MED ORDER — ONDANSETRON HCL 4 MG/2ML IJ SOLN: 4.0000 mg | Freq: Once | INTRAMUSCULAR | Status: DC | PRN

## 2021-02-15 MED ORDER — VANCOMYCIN HCL 1000 MG IV SOLR
INTRAVENOUS | Status: DC | PRN
  Administered 2021-02-15: 1000 mg via TOPICAL

## 2021-02-15 MED ORDER — METHOCARBAMOL 1000 MG/10ML IJ SOLN
500.0000 mg | Freq: Four times a day (QID) | INTRAVENOUS | Status: DC | PRN
  Filled 2021-02-15: qty 5

## 2021-02-15 MED ORDER — FENTANYL CITRATE (PF) 100 MCG/2ML IJ SOLN
INTRAMUSCULAR | Status: AC
  Filled 2021-02-15: qty 2

## 2021-02-15 MED ORDER — HYDROCHLOROTHIAZIDE 25 MG PO TABS
25.0000 mg | ORAL_TABLET | Freq: Every day | ORAL | Status: DC
  Administered 2021-02-15 – 2021-02-16 (×2): 25 mg via ORAL
  Filled 2021-02-15 (×2): qty 1

## 2021-02-15 MED ORDER — CHLORHEXIDINE GLUCONATE 0.12 % MT SOLN
OROMUCOSAL | Status: AC
  Administered 2021-02-15: 15 mL via OROMUCOSAL
  Filled 2021-02-15: qty 15

## 2021-02-15 MED ORDER — FENTANYL CITRATE (PF) 250 MCG/5ML IJ SOLN
INTRAMUSCULAR | Status: AC
  Filled 2021-02-15: qty 5

## 2021-02-15 MED ORDER — CEFAZOLIN SODIUM-DEXTROSE 2-4 GM/100ML-% IV SOLN
INTRAVENOUS | Status: AC
  Filled 2021-02-15: qty 100

## 2021-02-15 MED ORDER — AMLODIPINE BESYLATE 5 MG PO TABS
10.0000 mg | ORAL_TABLET | Freq: Every day | ORAL | Status: DC
  Administered 2021-02-16: 10 mg via ORAL
  Filled 2021-02-15: qty 2

## 2021-02-15 MED ORDER — PHENYLEPHRINE HCL-NACL 10-0.9 MG/250ML-% IV SOLN
INTRAVENOUS | Status: DC | PRN
  Administered 2021-02-15: 100 ug/min via INTRAVENOUS
  Administered 2021-02-15: 40 ug/min via INTRAVENOUS
  Administered 2021-02-15: 80 ug/min via INTRAVENOUS

## 2021-02-15 MED ORDER — LISINOPRIL 20 MG PO TABS
40.0000 mg | ORAL_TABLET | Freq: Every day | ORAL | Status: DC
  Administered 2021-02-15 – 2021-02-16 (×2): 40 mg via ORAL
  Filled 2021-02-15 (×2): qty 2

## 2021-02-15 MED ORDER — FENTANYL CITRATE (PF) 100 MCG/2ML IJ SOLN
INTRAMUSCULAR | Status: DC | PRN
  Administered 2021-02-15: 50 ug via INTRAVENOUS
  Administered 2021-02-15: 100 ug via INTRAVENOUS

## 2021-02-15 MED ORDER — AMISULPRIDE (ANTIEMETIC) 5 MG/2ML IV SOLN
10.0000 mg | Freq: Once | INTRAVENOUS | Status: AC | PRN
  Administered 2021-02-15: 10 mg via INTRAVENOUS

## 2021-02-15 MED ORDER — KETOROLAC TROMETHAMINE 15 MG/ML IJ SOLN: INTRAMUSCULAR | Status: DC | PRN

## 2021-02-15 MED ORDER — HYDROMORPHONE HCL 1 MG/ML IJ SOLN
0.5000 mg | INTRAMUSCULAR | Status: DC | PRN
  Administered 2021-02-15: 1 mg via INTRAVENOUS
  Filled 2021-02-15: qty 1

## 2021-02-15 MED ORDER — MIDAZOLAM HCL 2 MG/2ML IJ SOLN
INTRAMUSCULAR | Status: AC
  Filled 2021-02-15: qty 2

## 2021-02-15 MED ORDER — MIDAZOLAM HCL 5 MG/5ML IJ SOLN
INTRAMUSCULAR | Status: DC | PRN
  Administered 2021-02-15 (×2): 2 mg via INTRAVENOUS

## 2021-02-15 MED ORDER — DIPHENHYDRAMINE HCL 12.5 MG/5ML PO ELIX
12.5000 mg | ORAL_SOLUTION | ORAL | Status: DC | PRN
  Filled 2021-02-15: qty 10

## 2021-02-15 MED ORDER — SUCCINYLCHOLINE CHLORIDE 20 MG/ML IJ SOLN
INTRAMUSCULAR | Status: DC | PRN
  Administered 2021-02-15: 200 mg via INTRAVENOUS

## 2021-02-15 MED ORDER — OXYCODONE HCL 5 MG PO TABS
10.0000 mg | ORAL_TABLET | ORAL | Status: DC | PRN
  Administered 2021-02-16: 15 mg via ORAL
  Filled 2021-02-15: qty 3
  Filled 2021-02-15 (×2): qty 2

## 2021-02-15 MED ORDER — NITROGLYCERIN 0.4 MG SL SUBL: 0.4000 mg | SUBLINGUAL_TABLET | SUBLINGUAL | Status: DC | PRN

## 2021-02-15 MED ORDER — OXYCODONE HCL 5 MG/5ML PO SOLN: 5.0000 mg | Freq: Once | ORAL | Status: DC | PRN

## 2021-02-15 MED ORDER — ONDANSETRON HCL 4 MG PO TABS: 4.0000 mg | ORAL_TABLET | Freq: Four times a day (QID) | ORAL | Status: DC | PRN

## 2021-02-15 MED ORDER — DOCUSATE SODIUM 100 MG PO CAPS
100.0000 mg | ORAL_CAPSULE | Freq: Two times a day (BID) | ORAL | Status: DC
  Administered 2021-02-15 – 2021-02-16 (×3): 100 mg via ORAL
  Filled 2021-02-15 (×3): qty 1

## 2021-02-15 MED ORDER — GABAPENTIN 800 MG PO TABS
800.0000 mg | ORAL_TABLET | Freq: Three times a day (TID) | ORAL | Status: DC
  Filled 2021-02-15 (×2): qty 1

## 2021-02-15 MED ORDER — INSULIN PUMP
Freq: Three times a day (TID) | SUBCUTANEOUS | Status: DC
  Filled 2021-02-15: qty 1

## 2021-02-15 MED ORDER — ACETAMINOPHEN 325 MG PO TABS: 325.0000 mg | ORAL_TABLET | Freq: Four times a day (QID) | ORAL | Status: DC | PRN

## 2021-02-15 MED ORDER — INSULIN ASPART 100 UNIT/ML ~~LOC~~ SOLN: 0.0000 [IU] | Freq: Three times a day (TID) | SUBCUTANEOUS | Status: DC

## 2021-02-15 MED ORDER — FREESTYLE LIBRE 14 DAY SENSOR MISC: Freq: Three times a day (TID) | Status: DC

## 2021-02-15 MED ORDER — AMISULPRIDE (ANTIEMETIC) 5 MG/2ML IV SOLN
INTRAVENOUS | Status: AC
  Filled 2021-02-15: qty 4

## 2021-02-15 MED ORDER — OXYCODONE HCL 5 MG PO TABS: 5.0000 mg | ORAL_TABLET | Freq: Once | ORAL | Status: DC | PRN

## 2021-02-15 MED ORDER — LACTATED RINGERS IV SOLN: INTRAVENOUS | Status: DC

## 2021-02-15 MED ORDER — LIDOCAINE 2% (20 MG/ML) 5 ML SYRINGE
INTRAMUSCULAR | Status: AC
  Filled 2021-02-15: qty 5

## 2021-02-15 MED ORDER — 0.9 % SODIUM CHLORIDE (POUR BTL) OPTIME
TOPICAL | Status: DC | PRN
  Administered 2021-02-15: 1000 mL

## 2021-02-15 MED ORDER — METFORMIN HCL 500 MG PO TABS
500.0000 mg | ORAL_TABLET | Freq: Two times a day (BID) | ORAL | Status: DC
  Administered 2021-02-15 – 2021-02-16 (×2): 500 mg via ORAL
  Filled 2021-02-15 (×2): qty 1

## 2021-02-15 MED ORDER — CHLORHEXIDINE GLUCONATE 0.12 % MT SOLN: 15.0000 mL | Freq: Once | OROMUCOSAL | Status: AC

## 2021-02-15 MED ORDER — CEFAZOLIN SODIUM-DEXTROSE 2-4 GM/100ML-% IV SOLN: 2.0000 g | INTRAVENOUS | Status: DC

## 2021-02-15 MED ORDER — SENNA 8.6 MG PO TABS
1.0000 | ORAL_TABLET | Freq: Two times a day (BID) | ORAL | Status: DC
  Administered 2021-02-15 – 2021-02-16 (×3): 8.6 mg via ORAL
  Filled 2021-02-15 (×3): qty 1

## 2021-02-15 MED ORDER — OXYCODONE HCL 5 MG PO TABS
5.0000 mg | ORAL_TABLET | ORAL | Status: DC | PRN
  Administered 2021-02-15: 5 mg via ORAL
  Administered 2021-02-15 – 2021-02-16 (×2): 10 mg via ORAL
  Filled 2021-02-15: qty 2

## 2021-02-15 SURGICAL SUPPLY — 72 items
APL PRP STRL LF DISP 70% ISPRP (MISCELLANEOUS) ×1
BANDAGE ESMARK 6X9 LF (GAUZE/BANDAGES/DRESSINGS) ×1 IMPLANT
BIT DRILL 5/64X5 DISP (BIT) IMPLANT
BIT DRILL 7/64X5 DISP (BIT) IMPLANT
BIT DRILL CALIBRATED 4.3X320MM (BIT) ×1 IMPLANT
BIT DRILL CANN 7X200 (BIT) ×2 IMPLANT
BLADE MICRO SAGITTAL (BLADE) ×2 IMPLANT
BLADE SAW SGTL 13X75X1.27 (BLADE) ×2 IMPLANT
BLADE SURG 10 STRL SS (BLADE) ×2 IMPLANT
BNDG CMPR 9X6 STRL LF SNTH (GAUZE/BANDAGES/DRESSINGS) ×1
BNDG COHESIVE 4X5 TAN STRL (GAUZE/BANDAGES/DRESSINGS) ×2 IMPLANT
BNDG COHESIVE 6X5 TAN STRL LF (GAUZE/BANDAGES/DRESSINGS) ×2 IMPLANT
BNDG ESMARK 6X9 LF (GAUZE/BANDAGES/DRESSINGS) ×2
CANISTER SUCT 3000ML PPV (MISCELLANEOUS) ×2 IMPLANT
CHLORAPREP W/TINT 26 (MISCELLANEOUS) ×2 IMPLANT
COVER SURGICAL LIGHT HANDLE (MISCELLANEOUS) ×2 IMPLANT
COVER WAND RF STERILE (DRAPES) ×2 IMPLANT
CUFF TOURN SGL QUICK 34 (TOURNIQUET CUFF) ×2
CUFF TOURN SGL QUICK 42 (TOURNIQUET CUFF) IMPLANT
CUFF TRNQT CYL 34X4.125X (TOURNIQUET CUFF) ×1 IMPLANT
DRAPE C-ARM 42X72 X-RAY (DRAPES) IMPLANT
DRAPE OEC MINIVIEW 54X84 (DRAPES) ×2 IMPLANT
DRAPE U-SHAPE 47X51 STRL (DRAPES) IMPLANT
DRILL CALIBRATED 4.3X320MM (BIT) ×2
DRSG ADAPTIC 3X8 NADH LF (GAUZE/BANDAGES/DRESSINGS) IMPLANT
DRSG MEPITEL 4X7.2 (GAUZE/BANDAGES/DRESSINGS) ×2 IMPLANT
DRSG MEPITEL 8X12 (GAUZE/BANDAGES/DRESSINGS) ×2 IMPLANT
DRSG PAD ABDOMINAL 8X10 ST (GAUZE/BANDAGES/DRESSINGS) ×4 IMPLANT
ELECT REM PT RETURN 9FT ADLT (ELECTROSURGICAL) ×2
ELECTRODE REM PT RTRN 9FT ADLT (ELECTROSURGICAL) ×1 IMPLANT
GAUZE SPONGE 4X4 12PLY STRL (GAUZE/BANDAGES/DRESSINGS) IMPLANT
GAUZE SPONGE 4X4 16PLY XRAY LF (GAUZE/BANDAGES/DRESSINGS) ×2 IMPLANT
GLOVE BIO SURGEON STRL SZ8 (GLOVE) ×2 IMPLANT
GLOVE ECLIPSE 8.0 STRL XLNG CF (GLOVE) ×2 IMPLANT
GLOVE SRG 8 PF TXTR STRL LF DI (GLOVE) ×2 IMPLANT
GLOVE SURG UNDER POLY LF SZ8 (GLOVE) ×4
GOWN STRL REUS W/ TWL LRG LVL3 (GOWN DISPOSABLE) IMPLANT
GOWN STRL REUS W/ TWL XL LVL3 (GOWN DISPOSABLE) ×2 IMPLANT
GOWN STRL REUS W/TWL LRG LVL3 (GOWN DISPOSABLE)
GOWN STRL REUS W/TWL XL LVL3 (GOWN DISPOSABLE) ×4
GUIDEWIRE 2.6X80 BEAD TIP (WIRE) ×1 IMPLANT
GUIDWIRE 2.6X80 BEAD TIP (WIRE) ×2
KIT BASIN OR (CUSTOM PROCEDURE TRAY) ×2 IMPLANT
KIT TURNOVER KIT B (KITS) ×2 IMPLANT
NAIL LOCK ANKLE 11X150 (Nail) ×2 IMPLANT
NEEDLE 22X1 1/2 (OR ONLY) (NEEDLE) IMPLANT
NS IRRIG 1000ML POUR BTL (IV SOLUTION) ×2 IMPLANT
PACK ORTHO EXTREMITY (CUSTOM PROCEDURE TRAY) ×2 IMPLANT
PAD ABD 8X10 STRL (GAUZE/BANDAGES/DRESSINGS) ×2 IMPLANT
PAD ARMBOARD 7.5X6 YLW CONV (MISCELLANEOUS) ×4 IMPLANT
PAD CAST 4YDX4 CTTN HI CHSV (CAST SUPPLIES) ×1 IMPLANT
PADDING CAST COTTON 4X4 STRL (CAST SUPPLIES) ×2
SCREW CORT TI DBL LEAD 5X30 (Screw) ×4 IMPLANT
SCREW CORT TI DBL LEAD 5X75 (Screw) ×2 IMPLANT
SOAP 2 % CHG 4 OZ (WOUND CARE) ×2 IMPLANT
SPLINT PLASTER CAST XFAST 5X30 (CAST SUPPLIES) ×1 IMPLANT
SPLINT PLASTER XFAST SET 5X30 (CAST SUPPLIES) ×1
SPONGE LAP 18X18 RF (DISPOSABLE) ×2 IMPLANT
SPONGE SURGIFOAM ABS GEL 12-7 (HEMOSTASIS) IMPLANT
STAPLER VISISTAT 35W (STAPLE) IMPLANT
SUCTION FRAZIER HANDLE 10FR (MISCELLANEOUS) ×2
SUCTION TUBE FRAZIER 10FR DISP (MISCELLANEOUS) ×1 IMPLANT
SUT ETHILON 3 0 FSL (SUTURE) ×2 IMPLANT
SUT MNCRL AB 3-0 PS2 18 (SUTURE) ×2 IMPLANT
SUT PROLENE 3 0 PS 2 (SUTURE) IMPLANT
SUT VIC AB 2-0 CT1 27 (SUTURE) ×2
SUT VIC AB 2-0 CT1 TAPERPNT 27 (SUTURE) ×1 IMPLANT
SYR CONTROL 10ML LL (SYRINGE) IMPLANT
TOWEL GREEN STERILE (TOWEL DISPOSABLE) ×2 IMPLANT
TOWEL GREEN STERILE FF (TOWEL DISPOSABLE) ×2 IMPLANT
TUBE CONNECTING 12X1/4 (SUCTIONS) ×2 IMPLANT
WATER STERILE IRR 1000ML POUR (IV SOLUTION) ×2 IMPLANT

## 2021-02-15 NOTE — Transfer of Care (Signed)
Immediate Anesthesia Transfer of Care Note  Patient: Dwayne Rocha  Procedure(s) Performed: Left tibiotalocalcalcaneal nailing (Left Foot)  Patient Location: PACU  Anesthesia Type:General  Level of Consciousness: awake  Airway & Oxygen Therapy: Patient Spontanous Breathing and Patient connected to face mask oxygen  Post-op Assessment: Report given to RN and Post -op Vital signs reviewed and stable  Post vital signs: Reviewed and stable  Last Vitals:  Vitals Value Taken Time  BP 168/95 02/15/21 1100  Temp    Pulse 99 02/15/21 1102  Resp 11 02/15/21 1102  SpO2 97 % 02/15/21 1102  Vitals shown include unvalidated device data.  Last Pain:  Vitals:   02/15/21 0614  TempSrc:   PainSc: 8       Patients Stated Pain Goal: 2 (02/15/21 3734)  Complications: No complications documented.

## 2021-02-15 NOTE — Progress Notes (Signed)
Inpatient Diabetes Program Recommendations  AACE/ADA: New Consensus Statement on Inpatient Glycemic Control (2015)  Target Ranges:  Prepandial:   less than 140 mg/dL      Peak postprandial:   less than 180 mg/dL (1-2 hours)      Critically ill patients:  140 - 180 mg/dL   Lab Results  Component Value Date   GLUCAP 159 (H) 02/15/2021   HGBA1C 11.1 (H) 10/02/2015    Review of Glycemic Control Results for JADER, DESAI (MRN [id number]) as of 02/15/2021 12:36  Ref. Range 02/15/2021 06:08 02/15/2021 11:01  Glucose-Capillary Latest Ref Range: 70 - 99 mg/dL 388 (H) 828 (H)   Diabetes history: DM Outpatient Diabetes medications:  Omnipod- Patient states that his basal rates are usually 0.8-1.0 units/hr however he reduced rates by 20% prior to surgery.  He also boluses 1 unit/13 grams of CHO.  Current orders for Inpatient glycemic control:  None  Inpatient Diabetes Program Recommendations:    Please add orders for insulin pump order set.  Saw patient in PACU and discussed use of insulin pump. Patient sleepy but alert and oriented. Encouraged him to let RN know if he has any needs or feels like his blood sugar is low.  He states that wife has all supplies in the waiting room.  Will follow.  Thanks,  Beryl Meager, RN, BC-ADM Inpatient Diabetes Coordinator Pager [phone] (8a-5p)

## 2021-02-15 NOTE — Op Note (Signed)
02/15/2021  10:58 AM  PATIENT:  Dwayne Rocha  45 y.o. male  PRE-OPERATIVE DIAGNOSIS: Charcot neuroarthropathy of the left ankle, subtalar and talonavicular joints with talonavicular and subtalar joint dislocation  POST-OPERATIVE DIAGNOSIS: Same  Procedure(s): 1.  Open treatment of left subtalar dislocation 2.  Open treatment of left talonavicular joint dislocation 3.  Left navicular tibial arthrodesis 4.  Left calcaneal tibial arthrodesis  SURGEON:  Toni Arthurs, MD  ASSISTANT: Alfredo Martinez, PA-C  ANESTHESIA:   General, regional  EBL:  minimal   TOURNIQUET:   Total Tourniquet Time Documented: Thigh (Left) - 37 minutes Thigh (Left) - 88 minutes Total: Thigh (Left) - 125 minutes  COMPLICATIONS:  None apparent  DISPOSITION:  Extubated, awake and stable to recovery.  INDICATION FOR PROCEDURE: The patient is a 45 year old male with a past medical history significant for diabetes and morbid obesity.  He has a history of midfoot Charcot treated with casting and a Crow boot over a year ago.  In December he presented with acute Charcot changes of his ankle and subtalar joint.  He was initially casted but progressed to talonavicular and subtalar joint dislocation with progressive deformity and increasing tension on his skin.  His hemoglobin A1c is less than 7.  He presents now for operative treatment of these unstable left ankle and hindfoot Charcot deformities.  The risks and benefits of the alternative treatment options have been discussed in detail.  The patient wishes to proceed with surgery and specifically understands risks of bleeding, infection, nerve damage, blood clots, need for additional surgery, amputation and death.  PROCEDURE IN DETAIL:  After pre operative consent was obtained, and the correct operative site was identified, the patient was brought to the operating room and placed supine on the OR table.  Anesthesia was administered.  Pre-operative antibiotics were  administered.  A surgical timeout was taken.  The left lower extremity was prepped and draped in standard sterile fashion with a tourniquet around the thigh.  The extremity was elevated and the tourniquet was inflated to 350 mmHg.  A longitudinal incision was made over the fractured lateral malleolus extending along the course of the peroneals to the cuboid.  Sharp dissection was carried down through the subcutaneous tissues creating full-thickness flaps anteriorly and posteriorly.  The distal fibular fragments were removed and cleaned of all soft tissue for later bone graft.  The subtalar joint was identified.  The calcaneus was displaced lateral to the talus.  This was all cleaned of scar tissue and synovitis.  An incision was then made medially extending from the medial malleolus down to the talonavicular joint.  Dissection was carried sharply down through the subcutaneous tissues.  The tibialis anterior and tibialis posterior tendons were protected.  The talonavicular joint was identified.  It was noted to be dislocated.  Again fibrous tissue and synovitis was resected.  The talus was noted to be quite sclerotic and had no soft tissue attachments.  Multiple attempts were made to reduce the ankle and subtalar joints.  The efforts were unsuccessful due to substantial erosion of the talus and navicular and contracture of the lateral soft tissues.  The decision was made to remove the talus for bone graft and perform arthrodesis between the calcaneus and tibia as well as the navicular and tibia.  Once the talus was removed the calcaneus was reduced appropriately to the distal tibia.  The remaining articular cartilage and subchondral bone was removed from both sides of the joint.  The medial malleolus was resected with  an osteotome taking care to protect the surrounding soft tissues.  All bone fragments were denuded of soft tissue and morselized in the bone mill.  Bone graft was mixed with vancomycin powder.  The  calcaneus was reduced to the distal tibia and a guidepin inserted from the plantar heel into the distal tibial medullary canal.  The canal was then sequentially reamed to a diameter of 12 mm.  An 11 mm [employer] tibial talocalcaneal nail was selected.  It was inserted and seated appropriately at the plantar surface of the calcaneus.  Interlocking screws were inserted in the proximal end of the nail percutaneously.  The joint was then compressed appropriately at the plantar heel.  The posterior calcaneus screw was inserted through the targeting guide.  Was noted to have excellent purchase.  The morselized bone graft was then packed into the open areas of the joint from both medial and lateral incisions.  It was packed into the arthrodesis site between the navicular and the distal tibia.  Final AP and lateral radiographs as well as a Salzman view were obtained showing appropriate alignment of the hindfoot relative to the distal tibia.  The wounds were again irrigated copiously.  Vancomycin powder was sprinkled in the wounds.  Skin incisions were closed with 2-0 nylon.  Sterile dressings were applied followed by well-padded short leg splint.  The tourniquet was released after application of the dressings.  The patient was awakened from anesthesia and transported to the recovery room in stable condition.  FOLLOW UP PLAN: Nonweightbearing on the left lower extremity for 3 months.  Physical therapy, Occupational Therapy and transition of care for discharge planning.  Xarelto for DVT prophylaxis upon discharge.    Alfredo Martinez PA-C was present and scrubbed for the duration of the operative case. His assistance was essential in positioning the patient, prepping and draping, gaining and maintaining exposure, performing the operation, closing and dressing the wounds and applying the splint.

## 2021-02-15 NOTE — Anesthesia Postprocedure Evaluation (Signed)
Anesthesia Post Note  Patient: Dwayne Rocha  Procedure(s) Performed: Left tibiotalocalcalcaneal nailing (Left Foot)     Patient location during evaluation: PACU Anesthesia Type: General Level of consciousness: awake and alert Pain management: pain level controlled Vital Signs Assessment: post-procedure vital signs reviewed and stable Respiratory status: spontaneous breathing, nonlabored ventilation and respiratory function stable Cardiovascular status: blood pressure returned to baseline and stable Postop Assessment: no apparent nausea or vomiting Anesthetic complications: no   No complications documented.  Last Vitals:  Vitals:   02/15/21 1300 02/15/21 1320  BP: (!) 146/87 (!) 146/85  Pulse: 100 (!) 103  Resp: 16 18  Temp:  36.5 C  SpO2: 100% 100%    Last Pain:  Vitals:   02/15/21 1320  TempSrc: Oral  PainSc:                  Lucretia Kern

## 2021-02-15 NOTE — H&P (Signed)
Dwayne Rocha is an 45 y.o. male.   Chief Complaint:  Left ankle charcot HPI:  45 y/o male with PMH of diabetes, right hallux amputation and left foot charcot.  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  He has severe valgus malalignment through the ankle and hindfoot.  His a1c is below 7, and he is unable to maintain NWB.  He has had significant progression of deformity with increasing risk to his skin.  I believe it's medically necessary to re align his ankle and hindfoot surgically.  He understands the risks and benefits of the treatment plan and agrees.  Past Medical History:  Diagnosis Date  . Anxiety   . Complication of anesthesia    difficult to wake up last 2 procedures  . Depression   . Diabetes mellitus    type 2  . Diabetes mellitus with neuropathy (HCC) 05/03/2020   Has Humalog Kwikpen Insulin Pump  . Hyperlipidemia   . Hypertension   . Onychomycosis 05/03/2020  . Osteomyelitis of great toe of right foot (HCC) 05/03/2020    Past Surgical History:  Procedure Laterality Date  . AMPUTATION TOE Right 05/11/2020   Procedure: Right hallux amputation;  Surgeon: Toni Arthurs, MD;  Location: [facility];  Service: Orthopedics;  Laterality: Right;   . ELBOW SURGERY    . HIP SURGERY     "pins in hips"-"growth plate slipped"  . NECK SURGERY    . WRIST SURGERY      Family History  Problem Relation Age of Onset  . Hypertension Mother   . Diabetes Mother   . Stroke Maternal Grandmother   . Heart attack Maternal Grandfather   . Stroke Paternal Grandmother   . Stroke Paternal Grandfather   . Diabetes Brother   . Hypertension Brother    Social History:  reports that he quit smoking about 5 years ago. His smoking use included cigarettes. He has a 6.00 pack-year smoking history. He has never used smokeless tobacco. He reports current alcohol use. He reports that he does not use drugs.  Allergies:  Allergies  Allergen Reactions  .  Lyrica [Pregabalin] Swelling    Medications Prior to Admission  Medication Sig Dispense Refill  . amLODipine (NORVASC) 10 MG tablet Take 10 mg by mouth daily.    . ASPIRIN LOW DOSE 81 MG EC tablet TAKE 1 TABLET(81 MG) BY MOUTH DAILY (Patient taking differently: Take 81 mg by mouth daily.) 30 tablet 3  . Continuous Blood Gluc Sensor (FREESTYLE LIBRE 14 DAY SENSOR) MISC Apply topically 3 (three) times daily.    . furosemide (LASIX) 20 MG tablet Take 20 mg by mouth daily as needed for edema.    . gabapentin (NEURONTIN) 800 MG tablet Take 800 mg by mouth 3 (three) times daily.    . hydrochlorothiazide (HYDRODIURIL) 25 MG tablet Take 25 mg by mouth daily.    Marland Kitchen ibuprofen (ADVIL) 800 MG tablet Take 800 mg by mouth 3 (three) times daily as needed for moderate pain.     Marland Kitchen insulin lispro (HUMALOG KWIKPEN) 100 UNIT/ML KwikPen Inject into the skin continuous. Used with Omnipod pump, 150 units every 3 days    . lisinopril (ZESTRIL) 40 MG tablet Take 40 mg by mouth daily.    . metFORMIN (GLUCOPHAGE) 500 MG tablet Take 500 mg by mouth 2 (two) times daily.    . pantoprazole (PROTONIX) 40 MG tablet Take 40 mg by mouth 2 (two) times daily.    Marland Kitchen  simvastatin (ZOCOR) 10 MG tablet Take 10 mg by mouth at bedtime.    Marland Kitchen amoxicillin-clavulanate (AUGMENTIN) 875-125 MG tablet Take 1 tablet by mouth 2 (two) times daily. (Patient not taking: No sig reported) 28 tablet 0  . Insulin Syringe-Needle U-100 (INSULIN SYRINGE 1CC/31GX5/16") 31G X 5/16" 1 ML MISC SMARTSIG:Injection Twice Daily    . nitroGLYCERIN (NITROSTAT) 0.4 MG SL tablet Place 1 tablet (0.4 mg total) under the tongue every 5 (five) minutes as needed for chest pain. 30 tablet 3    Results for orders placed or performed during the hospital encounter of 02-19-2021 (from the past 48 hour(s))  Glucose, capillary     Status: Abnormal   Collection Time: 19-Feb-2021  6:08 AM  Result Value Ref Range   Glucose-Capillary 119 (H) 70 - 99 mg/dL    Comment: Glucose reference  range applies only to samples taken after fasting for at least 8 hours.   No results found.  Review of Systems  No recent f/c/n/v/wt loss.  Blood pressure (S) (!) 189/89, pulse 94, temperature 98.3 F (36.8 C), temperature source Oral, resp. rate 18, height 5\' 11"  (1.803 m), weight (!) 167.8 kg. Physical Exam  wn wd obese male in nad.  A and O x 4.  Normal mood and affect.  EOMI.  resp unlabored.  L LE with healthy skin.  Brisk cap refill at the toes.  Diminished sens to LT at the forefoot dorsally and plantarly.  No lymphadenopathy.  No ulcers  5/5 strength in PF and DF of the ankle and toes.  Severe valgus of the ankle and hindfoot.  Assessment/Plan L charcot ankle and subtalar joint - to the OR for L subtalar and ankle joint arthrodesis.  The risks and benefits of the alternative treatment options have been discussed in detail.  The patient wishes to proceed with surgery and specifically understands risks of bleeding, infection, nerve damage, blood clots, need for additional surgery, amputation and death.   , MD 02/19/2021, 7:17 AM

## 2021-02-15 NOTE — Anesthesia Procedure Notes (Signed)
Procedure Name: Intubation Date/Time: 02/15/2021 7:48 AM Performed by: Lytle Michaels, CRNA Pre-anesthesia Checklist: Patient identified, Emergency Drugs available, Suction available and Patient being monitored Patient Re-evaluated:Patient Re-evaluated prior to induction Oxygen Delivery Method: Circle system utilized Preoxygenation: Pre-oxygenation with 100% oxygen Induction Type: IV induction Ventilation: Mask ventilation without difficulty Laryngoscope Size: Miller and 2 Grade View: Grade I Tube type: Oral Tube size: 7.5 mm Number of attempts: 1 Airway Equipment and Method: Stylet and Oral airway Placement Confirmation: ETT inserted through vocal cords under direct vision,  positive ETCO2 and breath sounds checked- equal and bilateral Secured at: 24 cm Tube secured with: Tape Dental Injury: Teeth and Oropharynx as per pre-operative assessment

## 2021-02-15 NOTE — Progress Notes (Signed)
Orthopedic Tech Progress Note Patient Details:  Dwayne Rocha [birth year] [id number]  Casting Type of Cast: Short leg cast Cast Location: LLE Cast Material: Fiberglass Cast Intervention: Removal  Post Interventions Patient Tolerated: Well Instructions Provided: Other (comment)     Michelle Piper 02/15/2021, 8:22 AM

## 2021-02-16 ENCOUNTER — Encounter (HOSPITAL_COMMUNITY): Payer: Self-pay | Admitting: Orthopedic Surgery

## 2021-02-16 LAB — GLUCOSE, CAPILLARY: Glucose-Capillary: 131 mg/dL — ABNORMAL HIGH (ref 70–99)

## 2021-02-16 LAB — CBC
HCT: 23.6 % — ABNORMAL LOW (ref 39.0–52.0)
Hemoglobin: 7.4 g/dL — ABNORMAL LOW (ref 13.0–17.0)
MCH: 28.7 pg (ref 26.0–34.0)
MCHC: 31.4 g/dL (ref 30.0–36.0)
MCV: 91.5 fL (ref 80.0–100.0)
Platelets: 233 10*3/uL (ref 150–400)
RBC: 2.58 MIL/uL — ABNORMAL LOW (ref 4.22–5.81)
RDW: 15.5 % (ref 11.5–15.5)
WBC: 8.7 10*3/uL (ref 4.0–10.5)
nRBC: 0 % (ref 0.0–0.2)

## 2021-02-16 MED ORDER — SENNA 8.6 MG PO TABS: 2.0000 | ORAL_TABLET | Freq: Two times a day (BID) | ORAL | 0 refills | Status: DC

## 2021-02-16 MED ORDER — RIVAROXABAN 10 MG PO TABS: 10.0000 mg | ORAL_TABLET | Freq: Every day | ORAL | 0 refills | Status: DC

## 2021-02-16 MED ORDER — DOCUSATE SODIUM 100 MG PO CAPS: 100.0000 mg | ORAL_CAPSULE | Freq: Two times a day (BID) | ORAL | 0 refills | Status: DC

## 2021-02-16 MED ORDER — OXYCODONE HCL 5 MG PO TABS: 5.0000 mg | ORAL_TABLET | ORAL | 0 refills | Status: DC | PRN

## 2021-02-16 NOTE — Evaluation (Signed)
Physical Therapy Evaluation and Discharge Patient Details Name: Dwayne Rocha MRN: [id number] DOB: [birth year] Today's Date: 02/16/2021   History of Present Illness  The patient is a 45 year old male with a past medical history significant for diabetes and morbid obesity.  He has a history of midfoot Charcot treated with casting and a Crow boot over a year ago.  In December he presented with acute Charcot changes of his ankle and subtalar joint.  He was initially casted but progressed to talonavicular and subtalar joint dislocation with progressive deformity and increasing tension on his skin. Patient s/p open treatment of left subtalar dislocation. Patient NWB LLE.  Clinical Impression  Patient evaluated by Physical Therapy with no further acute PT needs identified. All education has been completed and the patient has no further questions. At the time of PT eval pt was able to perform transfers and ambulation with knee scooter without any physical assistance. Pt reports he has been on/off the knee scooter for about 2 years and he feels comfortable maneuvering with it around the house. Reviewed car transfer. Pt's wife reports pt has increased work of breathing with any activity and pt reporting it feels like phlegm. Provided incentive spirometer and instructed pt in proper use prior to end of session. From a physical therapy standpoint, pt is maintaining his NWB status well and is safe with knee scooter. He is likely near baseline from a functional standpoint. Recommend follow-up at the outpatient level when MD feels it is appropriate. See below for any follow-up Physical Therapy or equipment needs. PT is signing off. Thank you for this referral.     Follow Up Recommendations Outpatient PT (MD to order when appropriate per post-op protocol)    Equipment Recommendations  3in1 (PT) (bariatric)    Recommendations for Other Services       Precautions / Restrictions Precautions Precautions:  Fall Required Braces or Orthoses: Splint/Cast Restrictions Weight Bearing Restrictions: Yes LLE Weight Bearing: Non weight bearing      Mobility  Bed Mobility Overal bed mobility: Modified Independent Bed Mobility: Sit to Supine           General bed mobility comments: increased time needed.    Transfers Overall transfer level: Needs assistance Equipment used:  (knee scooter) Transfers: Sit to/from Stand Sit to Stand: Supervision         General transfer comment: Increased time to complete but pt was able to position knee scooter properly for safe transition from bed to scooter. Close supervision provided for safety but no assistance required. Pt maintain NWB throughout transfers.  Ambulation/Gait Ambulation/Gait assistance: Modified independent (Device/Increase time) Gait Distance (Feet): 250 Feet Assistive device:  (Knee scooter) Gait Pattern/deviations: WFL(Within Functional Limits) Gait velocity: Decreased Gait velocity interpretation: >2.62 ft/sec, indicative of community ambulatory General Gait Details: Pt with good form and safety awareness on the knee scooter. No assist required and no unsteadiness noted.  Stairs            Wheelchair Mobility    Modified Rankin (Stroke Patients Only)       Balance Overall balance assessment: Needs assistance Sitting-balance support: Feet supported;No upper extremity supported Sitting balance-[name] Scale: Normal     Standing balance support: Single extremity supported Standing balance-[name] Scale: Poor Standing balance comment: Needs at least one hand hold for standing/transfers due to NWB status                             Pertinent Vitals/Pain  Pain Assessment: Faces Faces Pain Scale: Hurts little more Pain Descriptors / Indicators: Aching;Operative site guarding Pain Intervention(s): Limited activity within patient's tolerance;Monitored during session;Repositioned    Home Living  Family/patient expects to be discharged to:: Private residence Living Arrangements: Spouse/significant other Available Help at Discharge: Family Type of Home: Apartment Home Access: Level entry     Home Layout: One level Home Equipment: Environmental consultant - 2 wheels;Crutches;Tub bench;Bedside commode;Other (comment);Adaptive equipment (knee scooter)      Prior Function Level of Independence: Independent with assistive device(s)         Comments: Predominantly Mod I for ADLs. Needs increased time, rest breaks and use of hand holds for mobility and knee scooter. Able to dress himself seated on side of bed by pulling legs up onto bed.     Hand Dominance        Extremity/Trunk Assessment   Upper Extremity Assessment Upper Extremity Assessment: Defer to OT evaluation    Lower Extremity Assessment Lower Extremity Assessment: LLE deficits/detail LLE Deficits / Details: cast/splint present s/p surgery    Cervical / Trunk Assessment Cervical / Trunk Assessment: Normal  Communication   Communication: No difficulties  Cognition Arousal/Alertness: Awake/alert Behavior During Therapy: WFL for tasks assessed/performed Overall Cognitive Status: Within Functional Limits for tasks assessed                                        General Comments      Exercises     Assessment/Plan    PT Assessment Patent does not need any further PT services  PT Problem List         PT Treatment Interventions      PT Goals (Current goals can be found in the Care Plan section)  Acute Rehab PT Goals Patient Stated Goal: Home today, wife would like pt to stay PT Goal Formulation: All assessment and education complete, DC therapy    Frequency     Barriers to discharge        Co-evaluation               AM-PAC PT "6 Clicks" Mobility  Outcome Measure Help needed turning from your back to your side while in a flat bed without using bedrails?: None Help needed moving from  lying on your back to sitting on the side of a flat bed without using bedrails?: None Help needed moving to and from a bed to a chair (including a wheelchair)?: None Help needed standing up from a chair using your arms (e.g., wheelchair or bedside chair)?: None Help needed to walk in hospital room?: None Help needed climbing 3-5 steps with a railing? : A Lot 6 Click Score: 22    End of Session Equipment Utilized During Treatment: Gait belt Activity Tolerance: Patient tolerated treatment well Patient left: in bed;with call bell/phone within reach;with family/visitor present Nurse Communication: Mobility status PT Visit Diagnosis: Unsteadiness on feet (R26.81);Pain Pain - Right/Left: Left Pain - part of body: Ankle and joints of foot    Time: 9417-4081 PT Time Calculation (min) (ACUTE ONLY): 29 min   Charges:   PT Evaluation $PT Eval Moderate Complexity: 1 Mod PT Treatments $Gait Training: 8-22 mins        Conni Slipper, PT, DPT Acute Rehabilitation Services Pager: [phone] Office: [phone]   Marylynn Pearson 02/16/2021, 3:11 PM

## 2021-02-16 NOTE — Progress Notes (Addendum)
Subjective: 1 Day Post-Op Procedure(s) (LRB): Left tibiotalocalcalcaneal nailing (Left)  Patient reports pain as mild.  Denies fever, N/V, CP, SOB, or change in appetite. Resting comfortably in chair. Ready to eat breakfast.  Objective:   VITALS:  Temp:  [97.5 F (36.4 C)-99.9 F (37.7 C)] 98.8 F (37.1 C) (04/08 0349) Pulse Rate:  [95-119] 102 (04/08 0349) Resp:  [10-20] 20 (04/08 0349) BP: (123-175)/(74-95) 130/74 (04/08 0349) SpO2:  [97 %-100 %] 99 % (04/08 0349)  General: WDWN patient in NAD. Psych:  Appropriate mood and affect. Neuro:  A&O x 3, Moving all extremities, sensation intact to light touch HEENT:  EOMs intact Chest:  Even non-labored respirations Skin: SLS C/D/I, no rashes or lesions Extremities: warm/dry, no visible edema, erythema or echymosis.  No lymphadenopathy. Pulses: Popliteus 2+ MSK:  ROM: EHL/FHL intact, MMT: able to perform quad set    LABS Recent Labs    02/15/21 0538  HGB 9.9*  WBC 5.9  PLT 259   Recent Labs    02/15/21 0538  NA 138  K 3.7  CL 106  CO2 24  BUN 24*  CREATININE 1.77*  GLUCOSE 125*   No results for input(s): LABPT, INR in the last 72 hours.   Assessment/Plan: 1 Day Post-Op Procedure(s) (LRB): Left tibiotalocalcalcaneal nailing (Left)  NWB L LE Up with therapy DVT ppx:  Lovenox in house, transition to Xarelto upon D/C Disp:  Pending D/C scripts on chart. After searching [place] PMP Aware the patient is provided a Rx for oxycodone. If patient is cleared by therapy and ready for D/C please inform me and I'll place D/C orders. Plan for 2 week outpatient post-op visit.  Alfredo Martinez PA-C [facility] Office:  [phone]
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
## 2021-02-16 NOTE — Progress Notes (Signed)
Patient is discharged form room 3C10 at this time. Alert and in stable condition. IV site d/c'd and instructions read to patient and spouse with understanding verbalized and all questions answered. Left unit via wheelchair with all belongings at side. 

## 2021-02-16 NOTE — Evaluation (Signed)
Occupational Therapy Evaluation Patient Details Name: Dwayne Rocha MRN: [id number] DOB: 03/06/1976 Today's Date: 02/16/2021    History of Present Illness The patient is a 45 year old male with a past medical history significant for diabetes and morbid obesity.  He has a history of midfoot Charcot treated with casting and a Crow boot over a year ago.  In December he presented with acute Charcot changes of his ankle and subtalar joint.  He was initially casted but progressed to talonavicular and subtalar joint dislocation with progressive deformity and increasing tension on his skin. Patient s/p open treatment of left subtalar dislocation. Patient NWB LLE.   Clinical Impression   Dwayne Rocha is a 45 year old man s/p foot surgery who presents with LLE NWB status, generalized weakness, decreased activity tolerance, and obesity. ON evaluation patient able to perform near baseline abilities as he is familiar with being non-weight bearing demonstrating ability to stand, maneuver in room on knee scooter, transfer to bed and perform dressing. Isn't able to fully maintain NWB status today - keeping left foot on ground to stabilize prior to placing knee on scooter. Reports taking "a couple of steps to get to the bathroom" despite NWB status prior to surgery. Needed minimal assistance to thread left leg in clothing due to increased edema but otherwise able to maneuver at edge of bed as he typically does. Mod I with bed mobility. Patient short of breath with minimal activity and HR up to 128. He reports that he usually is able to shower, mobilize on scooter to get to bed and get dressed before getting this short of breath. Patient has all needed DME at home and assistance of wife at home. Recommended use of BSC at home in master bath instead of using hallway bathroom. No further OT needs at this time.     Follow Up Recommendations  No OT follow up    Equipment Recommendations  Other (comment) (bari BSC)     Recommendations for Other Services       Precautions / Restrictions Precautions Precautions: Fall Required Braces or Orthoses: Splint/Cast Splint/Cast - Date Prophylactic Dressing Applied (if applicable): 02/15/21 Restrictions Weight Bearing Restrictions: Yes LLE Weight Bearing: Non weight bearing      Mobility Bed Mobility Overal bed mobility: Modified Independent Bed Mobility: Sit to Supine           General bed mobility comments: increased time needed.    Transfers Overall transfer level: Needs assistance Equipment used: None (knee scooter) Transfers: Sit to/from Stand Sit to Stand: Min guard         General transfer comment: Patient able to perform sit to stand using hand hold on knee scooter - but not able to maintain WB status status on LLE with foot placed on ground (but no weight placed through it). Able to manuever in room on scooter to back up to bed and transfer to side of bed. Heavy breathing after minimal activity but o2 sats WNL. HR up to 128. Reports he's usually able to do more before getting that short of breath.    Balance           Standing balance support: Single extremity supported Standing balance-[name] Scale: Poor Standing balance comment: Needs at least one hand hold for standing/transfers due to NWB status                           ADL either performed or assessed with clinical judgement  ADL Overall ADL's : Needs assistance/impaired Eating/Feeding: Independent   Grooming: Modified independent   Upper Body Bathing: Sitting;Modified independent   Lower Body Bathing: Modified independent   Upper Body Dressing : Modified independent   Lower Body Dressing: Minimal assistance;Sit to/from stand Lower Body Dressing Details (indicate cue type and reason): mod I as clothing caught on bandage Toilet Transfer: Min Pension scheme manager Details (indicate cue type and reason): requires hand holds to stand Toileting- Clothing  Manipulation and Hygiene: Modified independent;Sitting/lateral lean               Vision Patient Visual Report: No change from baseline       Perception     Praxis      Pertinent Vitals/Pain Pain Assessment: 0-10 Pain Score: 7  Pain Descriptors / Indicators: Aching Pain Intervention(s): Limited activity within patient's tolerance;Monitored during session;Repositioned     Hand Dominance     Extremity/Trunk Assessment Upper Extremity Assessment Upper Extremity Assessment: RUE deficits/detail;LUE deficits/detail RUE Deficits / Details: WFL ROM; 4-/5 throughout upper extremity RUE Coordination: WNL LUE Deficits / Details: WFL ROM; 4/5 strength throughout LUE Coordination: WNL   Lower Extremity Assessment Lower Extremity Assessment: Defer to PT evaluation   Cervical / Trunk Assessment Cervical / Trunk Assessment: Normal   Communication Communication Communication: No difficulties   Cognition Arousal/Alertness: Awake/alert Behavior During Therapy: WFL for tasks assessed/performed Overall Cognitive Status: Within Functional Limits for tasks assessed                                     General Comments       Exercises     Shoulder Instructions      Home Living Family/patient expects to be discharged to:: Private residence Living Arrangements: Spouse/significant other Available Help at Discharge: Family Type of Home: Apartment Home Access: Level entry     Home Layout: One level     Bathroom Shower/Tub: Tub/shower unit;Walk-in shower   Bathroom Toilet: Standard     Home Equipment: Walker - 2 wheels;Crutches;Tub bench;Bedside commode;Other (comment);Adaptive equipment (knee scooter) Adaptive Equipment: Reacher        Prior Functioning/Environment Level of Independence: Independent with assistive device(s)        Comments: Predominantly Mod I for ADLs. Needs increased time, rest breaks and use of hand holds for mobility and knee  scooter. Able to dress himself seated on side of bed by pulling legs up onto bed.        OT Problem List: Decreased strength;Decreased activity tolerance;Impaired balance (sitting and/or standing);Pain;Obesity      OT Treatment/Interventions:      OT Goals(Current goals can be found in the care plan section) Acute Rehab OT Goals OT Goal Formulation: All assessment and education complete, DC therapy  OT Frequency:     Barriers to D/C:            Co-evaluation              AM-PAC OT "6 Clicks" Daily Activity     Outcome Measure Help from another person eating meals?: None Help from another person taking care of personal grooming?: None Help from another person toileting, which includes using toliet, bedpan, or urinal?: None Help from another person bathing (including washing, rinsing, drying)?: None Help from another person to put on and taking off regular upper body clothing?: None Help from another person to put on and taking off regular lower body clothing?:  A Little 6 Click Score: 23   End of Session Equipment Utilized During Treatment: Other (comment) (knee scooter) Nurse Communication: Mobility status  Activity Tolerance: Patient limited by fatigue Patient left: in bed;with call bell/phone within reach;with family/visitor present  OT Visit Diagnosis: Other abnormalities of gait and mobility (R26.89);Pain;Muscle weakness (generalized) (M62.81) Pain - Right/Left: Left Pain - part of body: Ankle and joints of foot                Time: 9798-9211 OT Time Calculation (min): 28 min Charges:  OT General Charges $OT Visit: 1 Visit OT Evaluation $OT Eval Moderate Complexity: 1 Mod OT Treatments $Self Care/Home Management : 8-22 mins  Brooke Steinhilber, OTR/L Acute Care Rehab Services  Office [phone] Pager: [phone]   Kelli Churn 02/16/2021, 10:44 AM

## 2021-02-17 ENCOUNTER — Encounter (HOSPITAL_COMMUNITY): Payer: Self-pay

## 2021-02-17 ENCOUNTER — Emergency Department (HOSPITAL_COMMUNITY): Payer: 59

## 2021-02-17 ENCOUNTER — Inpatient Hospital Stay (HOSPITAL_COMMUNITY)
Admission: EM | Admit: 2021-02-17 | Discharge: 2021-02-27 | DRG: 853 | Disposition: A | Discharge status: Home Health | Payer: 59 | Attending: Internal Medicine | Admitting: Internal Medicine

## 2021-02-17 ENCOUNTER — Other Ambulatory Visit: Payer: Self-pay

## 2021-02-17 DIAGNOSIS — I129 Hypertensive chronic kidney disease with stage 1 through stage 4 chronic kidney disease, or unspecified chronic kidney disease: Secondary | ICD-10-CM | POA: Diagnosis present

## 2021-02-17 DIAGNOSIS — M7989 Other specified soft tissue disorders: Secondary | ICD-10-CM | POA: Diagnosis not present

## 2021-02-17 DIAGNOSIS — Z888 Allergy status to other drugs, medicaments and biological substances status: Secondary | ICD-10-CM

## 2021-02-17 DIAGNOSIS — R0902 Hypoxemia: Secondary | ICD-10-CM

## 2021-02-17 DIAGNOSIS — E785 Hyperlipidemia, unspecified: Secondary | ICD-10-CM | POA: Diagnosis present

## 2021-02-17 DIAGNOSIS — N1831 Chronic kidney disease, stage 3a: Secondary | ICD-10-CM | POA: Diagnosis present

## 2021-02-17 DIAGNOSIS — R4182 Altered mental status, unspecified: Secondary | ICD-10-CM | POA: Diagnosis not present

## 2021-02-17 DIAGNOSIS — Z833 Family history of diabetes mellitus: Secondary | ICD-10-CM

## 2021-02-17 DIAGNOSIS — T426X5A Adverse effect of other antiepileptic and sedative-hypnotic drugs, initial encounter: Secondary | ICD-10-CM | POA: Diagnosis present

## 2021-02-17 DIAGNOSIS — M86672 Other chronic osteomyelitis, left ankle and foot: Secondary | ICD-10-CM | POA: Diagnosis not present

## 2021-02-17 DIAGNOSIS — E1122 Type 2 diabetes mellitus with diabetic chronic kidney disease: Secondary | ICD-10-CM | POA: Diagnosis present

## 2021-02-17 DIAGNOSIS — R278 Other lack of coordination: Secondary | ICD-10-CM | POA: Diagnosis present

## 2021-02-17 DIAGNOSIS — E662 Morbid (severe) obesity with alveolar hypoventilation: Secondary | ICD-10-CM | POA: Diagnosis present

## 2021-02-17 DIAGNOSIS — Z7901 Long term (current) use of anticoagulants: Secondary | ICD-10-CM

## 2021-02-17 DIAGNOSIS — E1165 Type 2 diabetes mellitus with hyperglycemia: Secondary | ICD-10-CM | POA: Diagnosis present

## 2021-02-17 DIAGNOSIS — N179 Acute kidney failure, unspecified: Secondary | ICD-10-CM | POA: Diagnosis present

## 2021-02-17 DIAGNOSIS — E11610 Type 2 diabetes mellitus with diabetic neuropathic arthropathy: Secondary | ICD-10-CM | POA: Diagnosis present

## 2021-02-17 DIAGNOSIS — N189 Chronic kidney disease, unspecified: Secondary | ICD-10-CM

## 2021-02-17 DIAGNOSIS — E668 Other obesity: Secondary | ICD-10-CM | POA: Diagnosis not present

## 2021-02-17 DIAGNOSIS — Z6841 Body Mass Index (BMI) 40.0 and over, adult: Secondary | ICD-10-CM | POA: Diagnosis not present

## 2021-02-17 DIAGNOSIS — Z823 Family history of stroke: Secondary | ICD-10-CM

## 2021-02-17 DIAGNOSIS — A419 Sepsis, unspecified organism: Principal | ICD-10-CM | POA: Diagnosis present

## 2021-02-17 DIAGNOSIS — G928 Other toxic encephalopathy: Secondary | ICD-10-CM | POA: Diagnosis present

## 2021-02-17 DIAGNOSIS — E43 Unspecified severe protein-calorie malnutrition: Secondary | ICD-10-CM | POA: Diagnosis present

## 2021-02-17 DIAGNOSIS — T847XXA Infection and inflammatory reaction due to other internal orthopedic prosthetic devices, implants and grafts, initial encounter: Secondary | ICD-10-CM

## 2021-02-17 DIAGNOSIS — G4733 Obstructive sleep apnea (adult) (pediatric): Secondary | ICD-10-CM | POA: Diagnosis not present

## 2021-02-17 DIAGNOSIS — M14672 Charcot's joint, left ankle and foot: Secondary | ICD-10-CM | POA: Diagnosis not present

## 2021-02-17 DIAGNOSIS — J9601 Acute respiratory failure with hypoxia: Secondary | ICD-10-CM | POA: Diagnosis present

## 2021-02-17 DIAGNOSIS — R748 Abnormal levels of other serum enzymes: Secondary | ICD-10-CM | POA: Diagnosis not present

## 2021-02-17 DIAGNOSIS — R509 Fever, unspecified: Secondary | ICD-10-CM | POA: Diagnosis not present

## 2021-02-17 DIAGNOSIS — R000 Tachycardia, unspecified: Secondary | ICD-10-CM

## 2021-02-17 DIAGNOSIS — E1169 Type 2 diabetes mellitus with other specified complication: Secondary | ICD-10-CM | POA: Diagnosis present

## 2021-02-17 DIAGNOSIS — D62 Acute posthemorrhagic anemia: Secondary | ICD-10-CM | POA: Diagnosis not present

## 2021-02-17 DIAGNOSIS — M6282 Rhabdomyolysis: Secondary | ICD-10-CM | POA: Diagnosis present

## 2021-02-17 DIAGNOSIS — I10 Essential (primary) hypertension: Secondary | ICD-10-CM | POA: Diagnosis present

## 2021-02-17 DIAGNOSIS — F32A Depression, unspecified: Secondary | ICD-10-CM | POA: Diagnosis present

## 2021-02-17 DIAGNOSIS — Z79899 Other long term (current) drug therapy: Secondary | ICD-10-CM

## 2021-02-17 DIAGNOSIS — E669 Obesity, unspecified: Secondary | ICD-10-CM | POA: Diagnosis present

## 2021-02-17 DIAGNOSIS — Z9641 Presence of insulin pump (external) (internal): Secondary | ICD-10-CM | POA: Diagnosis present

## 2021-02-17 DIAGNOSIS — E538 Deficiency of other specified B group vitamins: Secondary | ICD-10-CM | POA: Diagnosis present

## 2021-02-17 DIAGNOSIS — M869 Osteomyelitis, unspecified: Secondary | ICD-10-CM | POA: Diagnosis present

## 2021-02-17 DIAGNOSIS — R6520 Severe sepsis without septic shock: Secondary | ICD-10-CM | POA: Diagnosis present

## 2021-02-17 DIAGNOSIS — R700 Elevated erythrocyte sedimentation rate: Secondary | ICD-10-CM | POA: Diagnosis present

## 2021-02-17 DIAGNOSIS — E871 Hypo-osmolality and hyponatremia: Secondary | ICD-10-CM | POA: Diagnosis present

## 2021-02-17 DIAGNOSIS — T847XXD Infection and inflammatory reaction due to other internal orthopedic prosthetic devices, implants and grafts, subsequent encounter: Secondary | ICD-10-CM | POA: Diagnosis not present

## 2021-02-17 DIAGNOSIS — G253 Myoclonus: Secondary | ICD-10-CM | POA: Diagnosis present

## 2021-02-17 DIAGNOSIS — R52 Pain, unspecified: Secondary | ICD-10-CM

## 2021-02-17 DIAGNOSIS — G4739 Other sleep apnea: Secondary | ICD-10-CM

## 2021-02-17 DIAGNOSIS — R109 Unspecified abdominal pain: Secondary | ICD-10-CM | POA: Diagnosis not present

## 2021-02-17 DIAGNOSIS — G4730 Sleep apnea, unspecified: Secondary | ICD-10-CM | POA: Diagnosis present

## 2021-02-17 DIAGNOSIS — M86072 Acute hematogenous osteomyelitis, left ankle and foot: Secondary | ICD-10-CM | POA: Diagnosis not present

## 2021-02-17 DIAGNOSIS — F419 Anxiety disorder, unspecified: Secondary | ICD-10-CM | POA: Diagnosis present

## 2021-02-17 DIAGNOSIS — E8809 Other disorders of plasma-protein metabolism, not elsewhere classified: Secondary | ICD-10-CM | POA: Diagnosis present

## 2021-02-17 DIAGNOSIS — K219 Gastro-esophageal reflux disease without esophagitis: Secondary | ICD-10-CM | POA: Diagnosis present

## 2021-02-17 DIAGNOSIS — Z20822 Contact with and (suspected) exposure to covid-19: Secondary | ICD-10-CM | POA: Diagnosis present

## 2021-02-17 DIAGNOSIS — G8918 Other acute postprocedural pain: Secondary | ICD-10-CM | POA: Diagnosis present

## 2021-02-17 DIAGNOSIS — R7982 Elevated C-reactive protein (CRP): Secondary | ICD-10-CM | POA: Diagnosis present

## 2021-02-17 DIAGNOSIS — N172 Acute kidney failure with medullary necrosis: Secondary | ICD-10-CM | POA: Diagnosis not present

## 2021-02-17 DIAGNOSIS — R110 Nausea: Secondary | ICD-10-CM | POA: Diagnosis not present

## 2021-02-17 DIAGNOSIS — E1142 Type 2 diabetes mellitus with diabetic polyneuropathy: Secondary | ICD-10-CM | POA: Diagnosis present

## 2021-02-17 DIAGNOSIS — Z8249 Family history of ischemic heart disease and other diseases of the circulatory system: Secondary | ICD-10-CM

## 2021-02-17 DIAGNOSIS — Z794 Long term (current) use of insulin: Secondary | ICD-10-CM

## 2021-02-17 DIAGNOSIS — D649 Anemia, unspecified: Secondary | ICD-10-CM | POA: Diagnosis not present

## 2021-02-17 DIAGNOSIS — Z87891 Personal history of nicotine dependence: Secondary | ICD-10-CM

## 2021-02-17 DIAGNOSIS — Z89421 Acquired absence of other right toe(s): Secondary | ICD-10-CM

## 2021-02-17 HISTORY — DX: Sleep apnea, unspecified: G47.30

## 2021-02-17 HISTORY — DX: Chronic kidney disease, unspecified: N18.9

## 2021-02-17 HISTORY — DX: Sepsis, unspecified organism: A41.9

## 2021-02-17 HISTORY — DX: Rhabdomyolysis: M62.82

## 2021-02-17 LAB — RETICULOCYTES
Immature Retic Fract: 22.6 % — ABNORMAL HIGH (ref 2.3–15.9)
RBC.: 2.48 MIL/uL — ABNORMAL LOW (ref 4.22–5.81)
Retic Count, Absolute: 88 10*3/uL (ref 19.0–186.0)
Retic Ct Pct: 3.6 % — ABNORMAL HIGH (ref 0.4–3.1)

## 2021-02-17 LAB — URINALYSIS, ROUTINE W REFLEX MICROSCOPIC
Bilirubin Urine: NEGATIVE
Glucose, UA: NEGATIVE mg/dL
Hgb urine dipstick: NEGATIVE
Ketones, ur: NEGATIVE mg/dL
Leukocytes,Ua: NEGATIVE
Nitrite: NEGATIVE
Protein, ur: 100 mg/dL — AB
Specific Gravity, Urine: 1.016 (ref 1.005–1.030)
pH: 5 (ref 5.0–8.0)

## 2021-02-17 LAB — SAMPLE TO BLOOD BANK

## 2021-02-17 LAB — I-STAT ARTERIAL BLOOD GAS, ED
Acid-base deficit: 4 mmol/L — ABNORMAL HIGH (ref 0.0–2.0)
Bicarbonate: 22.7 mmol/L (ref 20.0–28.0)
Calcium, Ion: 1.16 mmol/L (ref 1.15–1.40)
HCT: 22 % — ABNORMAL LOW (ref 39.0–52.0)
Hemoglobin: 7.5 g/dL — ABNORMAL LOW (ref 13.0–17.0)
O2 Saturation: 65 %
Patient temperature: 101
Potassium: 4.9 mmol/L (ref 3.5–5.1)
Sodium: 134 mmol/L — ABNORMAL LOW (ref 135–145)
TCO2: 24 mmol/L (ref 22–32)
pCO2 arterial: 55.3 mmHg — ABNORMAL HIGH (ref 32.0–48.0)
pH, Arterial: 7.229 — ABNORMAL LOW (ref 7.350–7.450)
pO2, Arterial: 43 mmHg — ABNORMAL LOW (ref 83.0–108.0)

## 2021-02-17 LAB — CBC WITH DIFFERENTIAL/PLATELET
Abs Immature Granulocytes: 0.05 10*3/uL (ref 0.00–0.07)
Basophils Absolute: 0 10*3/uL (ref 0.0–0.1)
Basophils Relative: 0 %
Eosinophils Absolute: 0.1 10*3/uL (ref 0.0–0.5)
Eosinophils Relative: 1 %
HCT: 23.4 % — ABNORMAL LOW (ref 39.0–52.0)
Hemoglobin: 7 g/dL — ABNORMAL LOW (ref 13.0–17.0)
Immature Granulocytes: 1 %
Lymphocytes Relative: 19 %
Lymphs Abs: 1.6 10*3/uL (ref 0.7–4.0)
MCH: 27.9 pg (ref 26.0–34.0)
MCHC: 29.9 g/dL — ABNORMAL LOW (ref 30.0–36.0)
MCV: 93.2 fL (ref 80.0–100.0)
Monocytes Absolute: 1.3 10*3/uL — ABNORMAL HIGH (ref 0.1–1.0)
Monocytes Relative: 15 %
Neutro Abs: 5.6 10*3/uL (ref 1.7–7.7)
Neutrophils Relative %: 64 %
Platelets: 237 10*3/uL (ref 150–400)
RBC: 2.51 MIL/uL — ABNORMAL LOW (ref 4.22–5.81)
RDW: 15.2 % (ref 11.5–15.5)
WBC: 8.6 10*3/uL (ref 4.0–10.5)
nRBC: 0 % (ref 0.0–0.2)

## 2021-02-17 LAB — COMPREHENSIVE METABOLIC PANEL
ALT: 13 U/L (ref 0–44)
AST: 24 U/L (ref 15–41)
Albumin: 2.5 g/dL — ABNORMAL LOW (ref 3.5–5.0)
Alkaline Phosphatase: 100 U/L (ref 38–126)
Anion gap: 5 (ref 5–15)
BUN: 53 mg/dL — ABNORMAL HIGH (ref 6–20)
CO2: 23 mmol/L (ref 22–32)
Calcium: 7.8 mg/dL — ABNORMAL LOW (ref 8.9–10.3)
Chloride: 103 mmol/L (ref 98–111)
Creatinine, Ser: 5.31 mg/dL — ABNORMAL HIGH (ref 0.61–1.24)
GFR, Estimated: 13 mL/min — ABNORMAL LOW (ref >60)
Glucose, Bld: 228 mg/dL — ABNORMAL HIGH (ref 70–99)
Potassium: 4.7 mmol/L (ref 3.5–5.1)
Sodium: 131 mmol/L — ABNORMAL LOW (ref 135–145)
Total Bilirubin: 0.3 mg/dL (ref 0.3–1.2)
Total Protein: 6.3 g/dL — ABNORMAL LOW (ref 6.5–8.1)

## 2021-02-17 LAB — IRON AND TIBC
Iron: 20 ug/dL — ABNORMAL LOW (ref 45–182)
Saturation Ratios: 8 % — ABNORMAL LOW (ref 17.9–39.5)
TIBC: 248 ug/dL — ABNORMAL LOW (ref 250–450)
UIBC: 228 ug/dL

## 2021-02-17 LAB — PROTIME-INR
INR: 1.6 — ABNORMAL HIGH (ref 0.8–1.2)
Prothrombin Time: 18.5 seconds — ABNORMAL HIGH (ref 11.4–15.2)

## 2021-02-17 LAB — RESP PANEL BY RT-PCR (FLU A&B, COVID) ARPGX2
Influenza A by PCR: NEGATIVE
Influenza B by PCR: NEGATIVE
SARS Coronavirus 2 by RT PCR: NEGATIVE

## 2021-02-17 LAB — VITAMIN B12: Vitamin B-12: 185 pg/mL (ref 180–914)

## 2021-02-17 LAB — FOLATE: Folate: 11.7 ng/mL (ref >5.9)

## 2021-02-17 LAB — APTT: aPTT: 43 seconds — ABNORMAL HIGH (ref 24–36)

## 2021-02-17 LAB — AMMONIA: Ammonia: 23 umol/L (ref 9–35)

## 2021-02-17 LAB — CK: Total CK: 1161 U/L — ABNORMAL HIGH (ref 49–397)

## 2021-02-17 LAB — LACTIC ACID, PLASMA
Lactic Acid, Venous: 0.9 mmol/L (ref 0.5–1.9)
Lactic Acid, Venous: 0.7 mmol/L (ref 0.5–1.9)

## 2021-02-17 LAB — FERRITIN: Ferritin: 410 ng/mL — ABNORMAL HIGH (ref 24–336)

## 2021-02-17 LAB — CBG MONITORING, ED: Glucose-Capillary: 165 mg/dL — ABNORMAL HIGH (ref 70–99)

## 2021-02-17 MED ORDER — LACTATED RINGERS IV BOLUS (SEPSIS)
500.0000 mL | Freq: Once | INTRAVENOUS | Status: AC
  Administered 2021-02-17: 500 mL via INTRAVENOUS

## 2021-02-17 MED ORDER — ONDANSETRON HCL 4 MG/2ML IJ SOLN
4.0000 mg | Freq: Four times a day (QID) | INTRAMUSCULAR | Status: DC | PRN
  Administered 2021-02-20: 4 mg via INTRAVENOUS
  Filled 2021-02-17 (×2): qty 2

## 2021-02-17 MED ORDER — VANCOMYCIN HCL 10 G IV SOLR
2500.0000 mg | Freq: Once | INTRAVENOUS | Status: AC
  Administered 2021-02-17: 2500 mg via INTRAVENOUS
  Filled 2021-02-17: qty 2500

## 2021-02-17 MED ORDER — LACTATED RINGERS IV BOLUS (SEPSIS): 400.0000 mL | Freq: Once | INTRAVENOUS | Status: DC

## 2021-02-17 MED ORDER — LACTATED RINGERS IV SOLN: INTRAVENOUS | Status: AC

## 2021-02-17 MED ORDER — METRONIDAZOLE IN NACL 5-0.79 MG/ML-% IV SOLN
500.0000 mg | Freq: Once | INTRAVENOUS | Status: AC
  Administered 2021-02-17: 500 mg via INTRAVENOUS
  Filled 2021-02-17: qty 100

## 2021-02-17 MED ORDER — ACETAMINOPHEN 325 MG PO TABS
650.0000 mg | ORAL_TABLET | Freq: Once | ORAL | Status: AC
  Administered 2021-02-17: 650 mg via ORAL
  Filled 2021-02-17: qty 2

## 2021-02-17 MED ORDER — ACETAMINOPHEN 500 MG PO TABS
1000.0000 mg | ORAL_TABLET | Freq: Four times a day (QID) | ORAL | Status: DC | PRN
  Administered 2021-02-18 – 2021-02-21 (×5): 1000 mg via ORAL
  Filled 2021-02-17 (×5): qty 2

## 2021-02-17 MED ORDER — SODIUM CHLORIDE 0.9 % IV SOLN
2.0000 g | Freq: Once | INTRAVENOUS | Status: AC
  Administered 2021-02-17: 2 g via INTRAVENOUS
  Filled 2021-02-17: qty 2

## 2021-02-17 MED ORDER — LACTATED RINGERS IV BOLUS (SEPSIS): 1000.0000 mL | Freq: Once | INTRAVENOUS | Status: DC

## 2021-02-17 MED ORDER — ACETAMINOPHEN 650 MG RE SUPP: 650.0000 mg | RECTAL | Status: DC | PRN

## 2021-02-17 MED ORDER — ONDANSETRON HCL 4 MG PO TABS: 4.0000 mg | ORAL_TABLET | Freq: Four times a day (QID) | ORAL | Status: DC | PRN

## 2021-02-17 MED ORDER — SODIUM CHLORIDE 0.9 % IV SOLN
2.0000 g | Freq: Three times a day (TID) | INTRAVENOUS | Status: DC
  Administered 2021-02-18 (×2): 2 g via INTRAVENOUS
  Filled 2021-02-17 (×2): qty 2

## 2021-02-17 MED ORDER — HYDROMORPHONE HCL 1 MG/ML IJ SOLN
1.5000 mg | Freq: Once | INTRAMUSCULAR | Status: AC
  Administered 2021-02-17: 1.5 mg via INTRAVENOUS
  Filled 2021-02-17: qty 2

## 2021-02-17 MED ORDER — SODIUM CHLORIDE 0.9 % IV BOLUS
2000.0000 mL | Freq: Once | INTRAVENOUS | Status: AC
  Administered 2021-02-17: 2000 mL via INTRAVENOUS

## 2021-02-17 MED ORDER — VANCOMYCIN HCL 10 G IV SOLR
2250.0000 mg | INTRAVENOUS | Status: DC
  Filled 2021-02-17: qty 2250

## 2021-02-17 MED ORDER — ENOXAPARIN SODIUM 30 MG/0.3ML ~~LOC~~ SOLN
30.0000 mg | SUBCUTANEOUS | Status: DC
  Administered 2021-02-17: 30 mg via SUBCUTANEOUS
  Filled 2021-02-17: qty 0.3

## 2021-02-17 NOTE — ED Triage Notes (Signed)
PER EMS: pt from home with c/o tremors since LEFT leg surgery this Thursday associated with fever, confusion and lack of appetite. Currently A&Ox4 on arrival. 81% on room air, pt placed on 3L [place] en route, O2 increased to 98%. Temp 103, HR 120. 500 cc NS given en route. CBG 289 Pt has insulin pump to RIGHT anterior thigh. Hx of sleep apnea

## 2021-02-17 NOTE — ED Notes (Signed)
Admitting at bedside 

## 2021-02-17 NOTE — ED Notes (Signed)
Pt waiting for ortho splint.
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
## 2021-02-17 NOTE — Progress Notes (Signed)
Elink monitoring for sepsis protocol 

## 2021-02-17 NOTE — ED Notes (Signed)
Voicemail left for ortho at this time.

## 2021-02-17 NOTE — ED Notes (Signed)
Bladder scan completed on pt. Scan detected . Pt's family stated that pt had not urinated since yesterday.

## 2021-02-17 NOTE — Progress Notes (Signed)
RT notified of need for BIPAP. Order is for QHS, and pt has room assigned upstairs. BIPAP will be initiated once pt is settled in his room on the floor.

## 2021-02-17 NOTE — Progress Notes (Signed)
Called re: patients post op fever and altered status.  Reviewed case with EDp and reviewed photos in chart.  Pictures were very reassuring for typical appearing POD #2 incisions with abundant swelling.  Will need splint place again with stirrup, and strict elevation.  Will see again in am

## 2021-02-17 NOTE — Progress Notes (Signed)
Pharmacy Antibiotic Note  Dwayne Rocha is a 45 y.o. male admitted on 02/17/2021 with sepsis. S/p L leg surgery 4/7. Pharmacy has been consulted for Vancomycin and Cefepime dosing.  Plan: Cefepime 2gm IV q8h Vancomycin 2500mg  IV now then 2250 mg IV Q 24 hrs. Goal AUC 400-550. Expected AUC: 515 SCr used: 1.77, Vd coeff 0.5 Will f/u renal function, micro data, and pt's clinical condition Vanc levels prn   Height: 5\' 11"  (180.3 cm) Weight: (!) 167.8 kg (370 lb) IBW/kg (Calculated) : 75.3  Temp (24hrs), Avg:101 F (38.3 C), Min:101 F (38.3 C), Max:101 F (38.3 C)  Recent Labs  Lab 02/15/21 0538 02/16/21 0855  WBC 5.9 8.7  CREATININE 1.77*  --     Estimated Creatinine Clearance: 85.5 mL/min (A) (by C-G formula based on SCr of 1.77 mg/dL (H)).    Allergies  Allergen Reactions  . Lyrica [Pregabalin] Swelling    Antimicrobials this admission: 4/9 Cefepime >>  4/9 Vanc >>   Microbiology results: 4/9 BCx:   UCx:    Thank you for allowing pharmacy to be a part of this patient's care.  6/9, PharmD, BCPS Please see amion for complete clinical pharmacist phone list 02/17/2021 3:43 PM
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
## 2021-02-17 NOTE — ED Notes (Signed)
Respiratory called for bipap.

## 2021-02-17 NOTE — ED Notes (Signed)
Ortho tech notified for splint.  

## 2021-02-17 NOTE — ED Notes (Signed)
Patient transported to CT 

## 2021-02-18 ENCOUNTER — Inpatient Hospital Stay (HOSPITAL_COMMUNITY): Payer: 59

## 2021-02-18 DIAGNOSIS — M7989 Other specified soft tissue disorders: Secondary | ICD-10-CM | POA: Diagnosis not present

## 2021-02-18 DIAGNOSIS — A419 Sepsis, unspecified organism: Secondary | ICD-10-CM | POA: Diagnosis not present

## 2021-02-18 LAB — COMPREHENSIVE METABOLIC PANEL
ALT: 9 U/L (ref 0–44)
AST: 22 U/L (ref 15–41)
Albumin: 2.4 g/dL — ABNORMAL LOW (ref 3.5–5.0)
Alkaline Phosphatase: 94 U/L (ref 38–126)
Anion gap: 9 (ref 5–15)
BUN: 54 mg/dL — ABNORMAL HIGH (ref 6–20)
CO2: 21 mmol/L — ABNORMAL LOW (ref 22–32)
Calcium: 7.7 mg/dL — ABNORMAL LOW (ref 8.9–10.3)
Chloride: 104 mmol/L (ref 98–111)
Creatinine, Ser: 5.44 mg/dL — ABNORMAL HIGH (ref 0.61–1.24)
GFR, Estimated: 13 mL/min — ABNORMAL LOW (ref >60)
Glucose, Bld: 191 mg/dL — ABNORMAL HIGH (ref 70–99)
Potassium: 5.1 mmol/L (ref 3.5–5.1)
Sodium: 134 mmol/L — ABNORMAL LOW (ref 135–145)
Total Bilirubin: 0.7 mg/dL (ref 0.3–1.2)
Total Protein: 6.4 g/dL — ABNORMAL LOW (ref 6.5–8.1)

## 2021-02-18 LAB — BLOOD GAS, ARTERIAL
Acid-base deficit: 5.4 mmol/L — ABNORMAL HIGH (ref 0.0–2.0)
Acid-base deficit: 3.7 mmol/L — ABNORMAL HIGH (ref 0.0–2.0)
Bicarbonate: 20.6 mmol/L (ref 20.0–28.0)
Bicarbonate: 21.4 mmol/L (ref 20.0–28.0)
Drawn by: 519031
FIO2: 40
FIO2: 40
O2 Saturation: 99 %
O2 Saturation: 99 %
Patient temperature: 37.1
Patient temperature: 37.9
pCO2 arterial: 48.3 mmHg — ABNORMAL HIGH (ref 32.0–48.0)
pCO2 arterial: 44.9 mmHg (ref 32.0–48.0)
pH, Arterial: 7.253 — ABNORMAL LOW (ref 7.350–7.450)
pH, Arterial: 7.305 — ABNORMAL LOW (ref 7.350–7.450)
pO2, Arterial: 131 mmHg — ABNORMAL HIGH (ref 83.0–108.0)
pO2, Arterial: 135 mmHg — ABNORMAL HIGH (ref 83.0–108.0)

## 2021-02-18 LAB — CBC WITH DIFFERENTIAL/PLATELET
Abs Immature Granulocytes: 0.05 10*3/uL (ref 0.00–0.07)
Basophils Absolute: 0 10*3/uL (ref 0.0–0.1)
Basophils Relative: 0 %
Eosinophils Absolute: 0.2 10*3/uL (ref 0.0–0.5)
Eosinophils Relative: 2 %
HCT: 22.2 % — ABNORMAL LOW (ref 39.0–52.0)
Hemoglobin: 6.7 g/dL — CL (ref 13.0–17.0)
Immature Granulocytes: 1 %
Lymphocytes Relative: 19 %
Lymphs Abs: 1.6 10*3/uL (ref 0.7–4.0)
MCH: 28 pg (ref 26.0–34.0)
MCHC: 30.2 g/dL (ref 30.0–36.0)
MCV: 92.9 fL (ref 80.0–100.0)
Monocytes Absolute: 1.2 10*3/uL — ABNORMAL HIGH (ref 0.1–1.0)
Monocytes Relative: 14 %
Neutro Abs: 5.4 10*3/uL (ref 1.7–7.7)
Neutrophils Relative %: 64 %
Platelets: 230 10*3/uL (ref 150–400)
RBC: 2.39 MIL/uL — ABNORMAL LOW (ref 4.22–5.81)
RDW: 15.1 % (ref 11.5–15.5)
WBC: 8.4 10*3/uL (ref 4.0–10.5)
nRBC: 0 % (ref 0.0–0.2)

## 2021-02-18 LAB — GLUCOSE, CAPILLARY
Glucose-Capillary: 177 mg/dL — ABNORMAL HIGH (ref 70–99)
Glucose-Capillary: 157 mg/dL — ABNORMAL HIGH (ref 70–99)
Glucose-Capillary: 154 mg/dL — ABNORMAL HIGH (ref 70–99)
Glucose-Capillary: 116 mg/dL — ABNORMAL HIGH (ref 70–99)
Glucose-Capillary: 190 mg/dL — ABNORMAL HIGH (ref 70–99)

## 2021-02-18 LAB — HIV ANTIBODY (ROUTINE TESTING W REFLEX): HIV Screen 4th Generation wRfx: NONREACTIVE

## 2021-02-18 LAB — HEMOGLOBIN AND HEMATOCRIT, BLOOD
HCT: 22.4 % — ABNORMAL LOW (ref 39.0–52.0)
Hemoglobin: 6.8 g/dL — CL (ref 13.0–17.0)

## 2021-02-18 LAB — PROCALCITONIN: Procalcitonin: 0.43 ng/mL

## 2021-02-18 LAB — PREPARE RBC (CROSSMATCH)

## 2021-02-18 LAB — ABO/RH: ABO/RH(D): O POS

## 2021-02-18 LAB — CK: Total CK: 1013 U/L — ABNORMAL HIGH (ref 49–397)

## 2021-02-18 MED ORDER — SODIUM CHLORIDE 0.9 % IV SOLN
2.0000 g | INTRAVENOUS | Status: DC
  Administered 2021-02-19: 2 g via INTRAVENOUS
  Filled 2021-02-18: qty 2

## 2021-02-18 MED ORDER — INSULIN ASPART 100 UNIT/ML ~~LOC~~ SOLN
0.0000 [IU] | Freq: Three times a day (TID) | SUBCUTANEOUS | Status: DC
  Administered 2021-02-18: 3 [IU] via SUBCUTANEOUS
  Administered 2021-02-18 – 2021-02-20 (×7): 4 [IU] via SUBCUTANEOUS
  Administered 2021-02-21 (×2): 3 [IU] via SUBCUTANEOUS
  Administered 2021-02-21: 4 [IU] via SUBCUTANEOUS
  Administered 2021-02-22: 3 [IU] via SUBCUTANEOUS

## 2021-02-18 MED ORDER — GABAPENTIN 400 MG PO CAPS
800.0000 mg | ORAL_CAPSULE | Freq: Three times a day (TID) | ORAL | Status: DC
  Administered 2021-02-18: 800 mg via ORAL
  Filled 2021-02-18: qty 2

## 2021-02-18 MED ORDER — AMLODIPINE BESYLATE 10 MG PO TABS
10.0000 mg | ORAL_TABLET | Freq: Every day | ORAL | Status: DC
  Administered 2021-02-18 – 2021-02-21 (×4): 10 mg via ORAL
  Filled 2021-02-18 (×4): qty 1

## 2021-02-18 MED ORDER — GABAPENTIN 300 MG PO CAPS
300.0000 mg | ORAL_CAPSULE | Freq: Three times a day (TID) | ORAL | Status: DC
  Administered 2021-02-18 – 2021-02-19 (×4): 300 mg via ORAL
  Filled 2021-02-18 (×4): qty 1

## 2021-02-18 MED ORDER — INSULIN LISPRO (1 UNIT DIAL) 100 UNIT/ML (KWIKPEN): 150.0000 [IU] | PEN_INJECTOR | SUBCUTANEOUS | Status: DC

## 2021-02-18 MED ORDER — INSULIN ASPART 100 UNIT/ML ~~LOC~~ SOLN: 0.0000 [IU] | Freq: Every day | SUBCUTANEOUS | Status: DC

## 2021-02-18 MED ORDER — ENOXAPARIN SODIUM 60 MG/0.6ML ~~LOC~~ SOLN
50.0000 mg | SUBCUTANEOUS | Status: DC
  Administered 2021-02-18 – 2021-02-20 (×3): 50 mg via SUBCUTANEOUS
  Filled 2021-02-18 (×3): qty 0.6

## 2021-02-18 MED ORDER — PANTOPRAZOLE SODIUM 40 MG PO TBEC
40.0000 mg | DELAYED_RELEASE_TABLET | Freq: Two times a day (BID) | ORAL | Status: DC
  Administered 2021-02-18 – 2021-02-21 (×8): 40 mg via ORAL
  Filled 2021-02-18 (×8): qty 1

## 2021-02-18 MED ORDER — SODIUM CHLORIDE 0.9% IV SOLUTION: Freq: Once | INTRAVENOUS | Status: AC

## 2021-02-18 MED ORDER — SENNA 8.6 MG PO TABS
2.0000 | ORAL_TABLET | Freq: Two times a day (BID) | ORAL | Status: DC
  Administered 2021-02-18 – 2021-02-21 (×8): 17.2 mg via ORAL
  Filled 2021-02-18 (×8): qty 2

## 2021-02-18 MED ORDER — OXYCODONE HCL 5 MG PO TABS
5.0000 mg | ORAL_TABLET | ORAL | Status: DC | PRN
  Administered 2021-02-18 – 2021-02-21 (×12): 5 mg via ORAL
  Filled 2021-02-18 (×12): qty 1

## 2021-02-18 MED ORDER — METRONIDAZOLE IN NACL 5-0.79 MG/ML-% IV SOLN
500.0000 mg | Freq: Three times a day (TID) | INTRAVENOUS | Status: DC
  Administered 2021-02-18 – 2021-02-19 (×5): 500 mg via INTRAVENOUS
  Filled 2021-02-18 (×5): qty 100

## 2021-02-18 MED ORDER — INSULIN PUMP
Freq: Three times a day (TID) | SUBCUTANEOUS | Status: DC
  Filled 2021-02-18: qty 1

## 2021-02-18 MED ORDER — CYANOCOBALAMIN 1000 MCG/ML IJ SOLN
1000.0000 ug | Freq: Every day | INTRAMUSCULAR | Status: DC
  Administered 2021-02-18 – 2021-02-21 (×4): 1000 ug via INTRAMUSCULAR
  Filled 2021-02-18 (×7): qty 1

## 2021-02-18 MED ORDER — INSULIN ASPART 100 UNIT/ML ~~LOC~~ SOLN: 0.0000 [IU] | Freq: Three times a day (TID) | SUBCUTANEOUS | Status: DC

## 2021-02-18 MED ORDER — CHLORHEXIDINE GLUCONATE CLOTH 2 % EX PADS
6.0000 | MEDICATED_PAD | Freq: Every day | CUTANEOUS | Status: DC
  Administered 2021-02-18 – 2021-02-22 (×5): 6 via TOPICAL

## 2021-02-18 MED ORDER — DOCUSATE SODIUM 100 MG PO CAPS
100.0000 mg | ORAL_CAPSULE | Freq: Two times a day (BID) | ORAL | Status: DC
  Administered 2021-02-18 – 2021-02-21 (×8): 100 mg via ORAL
  Filled 2021-02-18 (×8): qty 1

## 2021-02-18 NOTE — Progress Notes (Signed)
MD paged around 0745 about concerns for pt on the floor. Pt was ion BiPAP and turned off on night shift due to pt c/o not being abel to breathe- Pt on 8 L humidified  Nasal cannula.  MEWS changes in status from green to red and sepsis score of 7 is high. Pt is hard to wake up and have to shout and touch to get attention, then goes back to sleep- when awake pt has tremors, but is able to respond and follow commands as long as talking to him. Glucose pump battery is dead and MD made aware, sliding scale was added per page at 0747 to MD. Blood sugar covered, pt has not voided since on the floor and complaining about not being to pee into condom cath- bladder scan shows >645 ml at 0917. Pt started foley around 0930 and second unit RBC started then as well and ABX. Around approx 1205 p.m. MD made aware of pt harder to wake up with sternal rub and loud voice-pt can only open eyes for 2-3 seconds, not able to stay awake to follow commands- MD thinks pt is tired. pt VSS w/ elevated BPs. Hourly MEWS being completed, Charge RN made aware. Rapid response RN made aware at bedside at 1:30- pt put back on BIPAP, but lowered PIP from 16 to 12- pt is resting.  Pt woke up around about 1400 and took off BIPAP- however, went right back to sleep. Pt told to keep on 30 more minutes. Pt continues to wake up briefly and go back to sleep with tremors. Charge RN made aware, arterial blood gases ordered.   Pt was able to eat and stay awake on nasal cannula, however febrile at 1633 gave tylenol MD made aware, pt 1 hr later still febrile MD made aware and RN asked to keep pt cool. Pt is now sleeping and harder to wake up, responds to pain.    Will continue to monitor.

## 2021-02-18 NOTE — Plan of Care (Signed)
Progressing, pt requires BIPAP at times, will continue to monitor.

## 2021-02-18 NOTE — Progress Notes (Signed)
Pt. Arrived via stretcher from ED. Pt. Vital signs are stable. Answers questions appropriately but will immediately go back to sleep. CCMD called and confirmed correct rhythm. Orders given and carried out. Will continue to monitor. See PCR for vitals.

## 2021-02-18 NOTE — Progress Notes (Signed)
Orthopedic Tech Progress Note Patient Details:  Dwayne Rocha 1976/06/22 [id number]  Ortho Devices Type of Ortho Device: Post (short leg) splint,Stirrup splint Ortho Device/Splint Location: lle. plaster splints used at drs request. Ortho Device/Splint Interventions: Ordered,Application,Adjustment   Post Interventions Patient Tolerated: Well Instructions Provided: Care of device,Adjustment of device   [name] 02/18/2021, 4:44 AM

## 2021-02-18 NOTE — Progress Notes (Signed)
Subjective:     Patient reports pain as mild.  States that he is much better today than when he presented to the ER last night.  Denies fever, N/V, CP, SOB, or change in appetite. Resting comfortably in chair.  He feels alert and oriented today. Objective:   VITALS:  Temp:  [98.8 F (37.1 C)-101 F (38.3 C)] 98.8 F (37.1 C) (04/10 0926) Pulse Rate:  [100-120] 106 (04/10 0926) Resp:  [8-27] 18 (04/10 0926) BP: (108-156)/(54-120) 154/59 (04/10 0926) SpO2:  [92 %-100 %] 100 % (04/10 0926) FiO2 (%):  [40 %] 40 % (04/10 0116) Weight:  [167.8 kg-183 kg] 183 kg (04/09 2343)  General: WDWN patient in NAD. Psych:  Appropriate mood and affect. Neuro:  A&O x 3, Moving all extremities, sensation intact to light touch HEENT:  EOMs intact Chest:  Even non-labored respirations Skin: SLS C/D/I, no rashes or lesions Extremities: warm/dry, no visible edema, erythema or echymosis.  No lymphadenopathy. Pulses: Popliteus 2+ MSK:  ROM: EHL/FHL intact, MMT: able to perform quad set    LABS Recent Labs    02/16/21 0855 02/17/21 1531 02/17/21 1624 02/18/21 0039 02/18/21 0053  HGB 7.4* 7.0* 7.5* 6.8* 6.7*  WBC 8.7 8.6  --   --  8.4  PLT 233 237  --   --  230   Recent Labs    02/17/21 1531 02/17/21 1624 02/18/21 0053  NA 131* 134* 134*  K 4.7 4.9 5.1  CL 103  --  104  CO2 23  --  21*  BUN 53*  --  54*  CREATININE 5.31*  --  5.44*  GLUCOSE 228*  --  191*   Recent Labs    02/17/21 1531  INR 1.6*     Assessment/Plan:     NWB L LE Up with therapy DVT ppx:  Lovenox in house, transition to Xarelto upon D/C Disp:  Pending -Acute renal failure per primary service -Acute blood loss anemia, currently receiving transfusion with packed red blood cells.  -I have reviewed his clinical exam findings and incisions.  He has no concerning findings for postoperative infection.  His swelling is very typical for an acute postoperative lower extremity patient.  He needs to work on strict  elevation with his "toes above nose."  -Please call with any further questions.  He can follow-up with Dr. Victorino Dike as scheduled.  [facility] Office:  [phone]
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
## 2021-02-18 NOTE — Progress Notes (Signed)
VASCULAR LAB    Left lower extremity venous duplex has been performed.  See CV proc for preliminary results.   Breckon Reeves, RVT 02/18/2021, 11:43 AM

## 2021-02-18 NOTE — Progress Notes (Signed)
RT NOTES: RN removed patient from bipap d/t patient stating he could not breathe d/t too much pressure. Pt's sats 100% on 8L [place] and no distress noted.

## 2021-02-18 NOTE — Significant Event (Signed)
Rapid Response Event Note   Reason for Call :  Somnolence  Initial Focused Assessment:  While rounding on the unit the charge RN asked me to check in on this individual.  They said that he has been more sleepy and lethargic today.  The patient was complaining that the bipap was causing him breathing difficulty and he was transitioned to 8L [place].    When I assessed the patient he was difficult to arouse, only arousable to pain.  He would quickly fall back asleep in mid statement.  ABG was drawn overnight indicating mild respiratory acidosis.  This patient is of larger stature and I would assume that he is normally slightly hypercarbic    Other than his somnolence he does not appear to be in any distress  BP 158/69 ECG 112 RR14 O2 100   Interventions:  Patient placed back on bipap and the ipap volume changed to 12 from 15.   Plan of Care:  RN and I discussed the POC with this patient.  He will remain on bipap for an hour to see if he wakes up a bit.  If not, another ABG will be drawn.   Event Summary:   MD Notified:  Hospitalist made aware of his lethargy Call Time: On unit Arrival Time: 1310 End Time:  Andrey Spearman, RN

## 2021-02-18 NOTE — Progress Notes (Signed)
RT NOTES: ABG obtained and sent to lab. Lab tech notified.  

## 2021-02-19 ENCOUNTER — Inpatient Hospital Stay (HOSPITAL_COMMUNITY): Payer: 59

## 2021-02-19 DIAGNOSIS — E1165 Type 2 diabetes mellitus with hyperglycemia: Secondary | ICD-10-CM

## 2021-02-19 DIAGNOSIS — A419 Sepsis, unspecified organism: Principal | ICD-10-CM

## 2021-02-19 DIAGNOSIS — N1831 Chronic kidney disease, stage 3a: Secondary | ICD-10-CM

## 2021-02-19 DIAGNOSIS — R509 Fever, unspecified: Secondary | ICD-10-CM

## 2021-02-19 DIAGNOSIS — E669 Obesity, unspecified: Secondary | ICD-10-CM

## 2021-02-19 DIAGNOSIS — M14672 Charcot's joint, left ankle and foot: Secondary | ICD-10-CM

## 2021-02-19 DIAGNOSIS — N172 Acute kidney failure with medullary necrosis: Secondary | ICD-10-CM

## 2021-02-19 DIAGNOSIS — Z6841 Body Mass Index (BMI) 40.0 and over, adult: Secondary | ICD-10-CM

## 2021-02-19 DIAGNOSIS — R4182 Altered mental status, unspecified: Secondary | ICD-10-CM | POA: Diagnosis not present

## 2021-02-19 DIAGNOSIS — E1122 Type 2 diabetes mellitus with diabetic chronic kidney disease: Secondary | ICD-10-CM

## 2021-02-19 DIAGNOSIS — I129 Hypertensive chronic kidney disease with stage 1 through stage 4 chronic kidney disease, or unspecified chronic kidney disease: Secondary | ICD-10-CM

## 2021-02-19 DIAGNOSIS — R748 Abnormal levels of other serum enzymes: Secondary | ICD-10-CM

## 2021-02-19 DIAGNOSIS — D649 Anemia, unspecified: Secondary | ICD-10-CM

## 2021-02-19 LAB — COMPREHENSIVE METABOLIC PANEL
ALT: 11 U/L (ref 0–44)
AST: 19 U/L (ref 15–41)
Albumin: 2.3 g/dL — ABNORMAL LOW (ref 3.5–5.0)
Alkaline Phosphatase: 75 U/L (ref 38–126)
Anion gap: 9 (ref 5–15)
BUN: 44 mg/dL — ABNORMAL HIGH (ref 6–20)
CO2: 20 mmol/L — ABNORMAL LOW (ref 22–32)
Calcium: 8.4 mg/dL — ABNORMAL LOW (ref 8.9–10.3)
Chloride: 109 mmol/L (ref 98–111)
Creatinine, Ser: 3.33 mg/dL — ABNORMAL HIGH (ref 0.61–1.24)
GFR, Estimated: 23 mL/min — ABNORMAL LOW (ref >60)
Glucose, Bld: 173 mg/dL — ABNORMAL HIGH (ref 70–99)
Potassium: 4.9 mmol/L (ref 3.5–5.1)
Sodium: 138 mmol/L (ref 135–145)
Total Bilirubin: 0.9 mg/dL (ref 0.3–1.2)
Total Protein: 6.5 g/dL (ref 6.5–8.1)

## 2021-02-19 LAB — CBC WITH DIFFERENTIAL/PLATELET
Abs Immature Granulocytes: 0.03 10*3/uL (ref 0.00–0.07)
Basophils Absolute: 0 10*3/uL (ref 0.0–0.1)
Basophils Relative: 0 %
Eosinophils Absolute: 0.2 10*3/uL (ref 0.0–0.5)
Eosinophils Relative: 2 %
HCT: 24.1 % — ABNORMAL LOW (ref 39.0–52.0)
Hemoglobin: 7.5 g/dL — ABNORMAL LOW (ref 13.0–17.0)
Immature Granulocytes: 0 %
Lymphocytes Relative: 9 %
Lymphs Abs: 0.7 10*3/uL (ref 0.7–4.0)
MCH: 28 pg (ref 26.0–34.0)
MCHC: 31.1 g/dL (ref 30.0–36.0)
MCV: 89.9 fL (ref 80.0–100.0)
Monocytes Absolute: 0.9 10*3/uL (ref 0.1–1.0)
Monocytes Relative: 12 %
Neutro Abs: 5.7 10*3/uL (ref 1.7–7.7)
Neutrophils Relative %: 77 %
Platelets: 238 10*3/uL (ref 150–400)
RBC: 2.68 MIL/uL — ABNORMAL LOW (ref 4.22–5.81)
RDW: 15.4 % (ref 11.5–15.5)
WBC: 7.5 10*3/uL (ref 4.0–10.5)
nRBC: 0 % (ref 0.0–0.2)

## 2021-02-19 LAB — TSH: TSH: 0.911 u[IU]/mL (ref 0.350–4.500)

## 2021-02-19 LAB — GLUCOSE, CAPILLARY
Glucose-Capillary: 194 mg/dL — ABNORMAL HIGH (ref 70–99)
Glucose-Capillary: 185 mg/dL — ABNORMAL HIGH (ref 70–99)
Glucose-Capillary: 176 mg/dL — ABNORMAL HIGH (ref 70–99)
Glucose-Capillary: 184 mg/dL — ABNORMAL HIGH (ref 70–99)

## 2021-02-19 LAB — BLOOD GAS, ARTERIAL
Acid-base deficit: 1 mmol/L (ref 0.0–2.0)
Bicarbonate: 23.1 mmol/L (ref 20.0–28.0)
Drawn by: 519031
FIO2: 21
O2 Saturation: 94.2 %
Patient temperature: 37.7
pCO2 arterial: 39.1 mmHg (ref 32.0–48.0)
pH, Arterial: 7.393 (ref 7.350–7.450)
pO2, Arterial: 72.4 mmHg — ABNORMAL LOW (ref 83.0–108.0)

## 2021-02-19 LAB — BPAM RBC
Blood Product Expiration Date: 202205062359
Blood Product Expiration Date: 202205102359
ISSUE DATE / TIME: 202204100508
ISSUE DATE / TIME: 202204100902
Unit Type and Rh: 5100
Unit Type and Rh: 5100

## 2021-02-19 LAB — PROCALCITONIN: Procalcitonin: 0.31 ng/mL

## 2021-02-19 LAB — URINE CULTURE: Culture: NO GROWTH

## 2021-02-19 LAB — TYPE AND SCREEN
ABO/RH(D): O POS
Antibody Screen: NEGATIVE
Unit division: 0
Unit division: 0

## 2021-02-19 LAB — PHOSPHORUS: Phosphorus: 4 mg/dL (ref 2.5–4.6)

## 2021-02-19 MED ORDER — SODIUM CHLORIDE 0.9 % IV SOLN
510.0000 mg | Freq: Once | INTRAVENOUS | Status: AC
  Administered 2021-02-19: 510 mg via INTRAVENOUS
  Filled 2021-02-19: qty 17

## 2021-02-19 MED ORDER — INSULIN GLARGINE 100 UNIT/ML ~~LOC~~ SOLN
10.0000 [IU] | Freq: Every day | SUBCUTANEOUS | Status: DC
  Administered 2021-02-19 – 2021-02-21 (×3): 10 [IU] via SUBCUTANEOUS
  Filled 2021-02-19 (×4): qty 0.1

## 2021-02-19 MED ORDER — LACTATED RINGERS IV SOLN: INTRAVENOUS | Status: AC
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
## 2021-02-19 NOTE — Consult Note (Signed)
[facility]    Date of Admission:  02/17/2021     Total days of antibiotics                Reason for Consult: Fever    Referring Provider: Dr. Nelson ChimesAmin Primary Care Provider: Fleet ContrasAvbuere, Edwin, MD   ASSESSMENT:  Mr. Greig RightLampkin is a 45 y/o AA male POD #4 from left tibiotalocalcalcaneal nailing for Charcot foot admitted with fever, hypoxia and acute kidney injury. Source of symptoms is unclear although likely multifactorial. BMI is 56 and hypoventilation syndrome may be playing a role as Orthopedics evaluated surgical site and does not appear with evidence of infection. No evidence of pneumonia on chest x-ray and question possible development with new hypoxia. No evidence of DVT on lower extremity dopplers and anticoagulated following surgery. Blood cultures have remained without growth to date. Currently on cefepime and metronidazole. Would favor imaging of the left ankle. Continue cefepime for now. Not a candidate for vancomycin given renal function or daptomycin due to elevated CK. Renal function improving. Continue respiratory support per primary team.   PLAN:  1. Continue cefepime 2. Respiratory support per primary team.  3. Consider imaging of the left ankle.  4. Monitor blood cultures and fever curve.    Principal Problem:   Sepsis due to undetermined organism [facility](HCC) Active Problems:   Essential hypertension   Uncontrolled type 2 diabetes mellitus with hyperglycemia (HCC)   Charcot's joint of ankle, left   Sleep apnea   Class 3 obesity   Acute renal failure superimposed on stage 3a chronic kidney disease (HCC)   Normocytic anemia   Hyponatremia   Hypoalbuminemia   Severe protein-calorie malnutrition (HCC)   Elevated CK   . amLODipine  10 mg Oral Daily  . Chlorhexidine Gluconate Cloth  6 each Topical Daily  . cyanocobalamin  1,000 mcg Intramuscular Daily  . docusate sodium  100 mg Oral BID  . enoxaparin (LOVENOX) injection  50 mg Subcutaneous  Q24H  . gabapentin  300 mg Oral TID  . insulin aspart  0-20 Units Subcutaneous TID WC  . insulin aspart  0-5 Units Subcutaneous QHS  . pantoprazole  40 mg Oral BID  . senna  2 tablet Oral BID     HPI: Angelina PihWilliam Vanderhoof is a 45 y.o. male with previous medical history significant for morbid obesity, Type 2 diabetes, and hypertension admitted with fever and hypoxia s/p tibiotalocalcaleneal nailing with Charcot foot 2 days prior to presentation.   On initial evaluation in ED found to be febrile, tachycardic and tacypneic with acute onset hypoxia and some altered mental status. Lab work with with AKI with creatinine of 5.31, anemia with hemoglobin 7, and CK elevated at 1100. Chest x-ray with no active disease. CT head with no acute intracranial abnormality and CT renal with no findings of the abdomen to explain flank pain. Started on broad spectrum antibiotics with vancomycin, cefepime and metronidazole.   Mr. Mcmann's foot evaluated by Orthopedics with no clear signs of infection. Has been febrile with max temperature of 102.1 in the past 24 hours. Blood cultures from 4/9 have been without growth. Has been requiring supplemental oxygen which he has not used at home between 4-8 L via nasal cannula. Left lower extremity doppler with no evidence of DVT.    Review of Systems: Review of Systems  Constitutional: Positive for fever. Negative for chills and weight loss.  Respiratory: Negative for cough, shortness of breath and wheezing.   Cardiovascular:  Negative for chest pain and leg swelling.  Gastrointestinal: Negative for abdominal pain, constipation, diarrhea, nausea and vomiting.  Skin: Negative for rash.     Past Medical History:  Diagnosis Date  . Anxiety   . Complication of anesthesia    difficult to wake up last 2 procedures  . Depression   . Diabetes mellitus    type 2  . Diabetes mellitus with neuropathy (HCC) 05/03/2020   Has Humalog Kwikpen Insulin Pump  . Hyperlipidemia   .  Hypertension   . Onychomycosis 05/03/2020  . Osteomyelitis of great toe of right foot (HCC) 05/03/2020  . Sleep apnea     Social History   Tobacco Use  . Smoking status: Former Smoker    Packs/day: 0.50    Years: 12.00    Pack years: 6.00    Types: Cigarettes    Quit date: 2017    Years since quitting: 5.2  . Smokeless tobacco: Never Used  Vaping Use  . Vaping Use: Never used  Substance Use Topics  . Alcohol use: Yes    Comment: Socially   . Drug use: No    Family History  Problem Relation Age of Onset  . Hypertension Mother   . Diabetes Mother   . Stroke Maternal Grandmother   . Heart attack Maternal Grandfather   . Stroke Paternal Grandmother   . Stroke Paternal Grandfather   . Diabetes Brother   . Hypertension Brother     Allergies  Allergen Reactions  . Lyrica [Pregabalin] Swelling    OBJECTIVE: Blood pressure (!) 154/74, pulse (!) 113, temperature 99.8 F (37.7 C), temperature source Oral, resp. rate 18, height 5\' 11"  (1.803 m), weight (!) 183 kg, SpO2 97 %.  Physical Exam Constitutional:      General: He is not in acute distress.    Appearance: He is well-developed. He is obese.     Interventions: Nasal cannula in place.     Comments: Lying in bed with head of bed elevated; slow to answer questions at times.   Cardiovascular:     Rate and Rhythm: Normal rate and regular rhythm.     Heart sounds: Normal heart sounds.  Pulmonary:     Effort: Pulmonary effort is normal.     Breath sounds: Normal breath sounds.  Skin:    General: Skin is warm and dry.  Neurological:     Mental Status: He is alert and oriented to person, place, and time.  Psychiatric:        Mood and Affect: Mood normal.     Lab Results Lab Results  Component Value Date   WBC 7.5 02/19/2021   HGB 7.5 (L) 02/19/2021   HCT 24.1 (L) 02/19/2021   MCV 89.9 02/19/2021   PLT 238 02/19/2021    Lab Results  Component Value Date   CREATININE 3.33 (H) 02/19/2021   BUN 44 (H)  02/19/2021   NA 138 02/19/2021   K 4.9 02/19/2021   CL 109 02/19/2021   CO2 20 (L) 02/19/2021    Lab Results  Component Value Date   ALT 11 02/19/2021   AST 19 02/19/2021   ALKPHOS 75 02/19/2021   BILITOT 0.9 02/19/2021     Microbiology: Recent Results (from the past 240 hour(s))  SARS CORONAVIRUS 2 (TAT 6-24 HRS) Nasopharyngeal Nasopharyngeal Swab     Status: None   Collection Time: 02/12/21  9:40 AM   Specimen: Nasopharyngeal Swab  Result Value Ref Range Status   SARS Coronavirus 2 NEGATIVE  NEGATIVE Final    Comment: (NOTE) SARS-CoV-2 target nucleic acids are NOT DETECTED.  The SARS-CoV-2 RNA is generally detectable in upper and lower respiratory specimens during the acute phase of infection. Negative results do not preclude SARS-CoV-2 infection, do not rule out co-infections with other pathogens, and should not be used as the sole basis for treatment or other patient management decisions. Negative results must be combined with clinical observations, patient history, and epidemiological information. The expected result is Negative.  Fact Sheet for Patients: HairSlick.no  Fact Sheet for Healthcare Providers: quierodirigir.com  This test is not yet approved or cleared by the [place] FDA and  has been authorized for detection and/or diagnosis of SARS-CoV-2 by FDA under an Emergency Use Authorization (EUA). This EUA will remain  in effect (meaning this test can be used) for the duration of the COVID-19 declaration under Se ction 564(b)(1) of the Act, 21 U.S.C. section 360bbb-3(b)(1), unless the authorization is terminated or revoked sooner.  Performed at [facility], [address], [place], [place] [address]   Surgical pcr screen     Status: None   Collection Time: 02/15/21  5:41 AM   Specimen: Nasal Mucosa; Nasal Swab  Result Value Ref Range Status   MRSA, PCR NEGATIVE NEGATIVE Final    Staphylococcus aureus NEGATIVE NEGATIVE Final    Comment: (NOTE) The Xpert SA Assay (FDA approved for NASAL specimens in patients 54 years of age and older), is one component of a comprehensive surveillance program. It is not intended to diagnose infection nor to guide or monitor treatment. Performed at [facility], [address] [place], [place] [address]   Blood culture (routine x 2)     Status: None (Preliminary result)   Collection Time: 02/17/21  3:36 PM   Specimen: BLOOD RIGHT FOREARM  Result Value Ref Range Status   Specimen Description BLOOD RIGHT FOREARM  Final   Special Requests   Final    BOTTLES DRAWN AEROBIC AND ANAEROBIC Blood Culture adequate volume   Culture   Final    NO GROWTH 2 DAYS Performed at [facility], [address] [place], [place] [address]    Report Status PENDING  Incomplete  Resp Panel by RT-PCR (Flu A&B, Covid) Nasopharyngeal Swab     Status: None   Collection Time: 02/17/21  3:41 PM   Specimen: Nasopharyngeal Swab; Nasopharyngeal(NP) swabs in vial transport medium  Result Value Ref Range Status   SARS Coronavirus 2 by RT PCR NEGATIVE NEGATIVE Final    Comment: (NOTE) SARS-CoV-2 target nucleic acids are NOT DETECTED.  The SARS-CoV-2 RNA is generally detectable in upper respiratory specimens during the acute phase of infection. The lowest concentration of SARS-CoV-2 viral copies this assay can detect is 138 copies/mL. A negative result does not preclude SARS-Cov-2 infection and should not be used as the sole basis for treatment or other patient management decisions. A negative result may occur with  improper specimen collection/handling, submission of specimen other than nasopharyngeal swab, presence of viral mutation(s) within the areas targeted by this assay, and inadequate number of viral copies(<138 copies/mL). A negative result must be combined with clinical observations, patient history, and epidemiological information. The  expected result is Negative.  Fact Sheet for Patients:  BloggerCourse.com  Fact Sheet for Healthcare Providers:  SeriousBroker.it  This test is no t yet approved or cleared by the [place] FDA and  has been authorized for detection and/or diagnosis of SARS-CoV-2 by FDA under an  Emergency Use Authorization (EUA). This EUA will remain  in effect (meaning this test can be used) for the duration of the COVID-19 declaration under Section 564(b)(1) of the Act, 21 U.S.C.section 360bbb-3(b)(1), unless the authorization is terminated  or revoked sooner.       Influenza A by PCR NEGATIVE NEGATIVE Final   Influenza B by PCR NEGATIVE NEGATIVE Final    Comment: (NOTE) The Xpert Xpress SARS-CoV-2/FLU/RSV plus assay is intended as an aid in the diagnosis of influenza from Nasopharyngeal swab specimens and should not be used as a sole basis for treatment. Nasal washings and aspirates are unacceptable for Xpert Xpress SARS-CoV-2/FLU/RSV testing.  Fact Sheet for Patients: BloggerCourse.com  Fact Sheet for Healthcare Providers: SeriousBroker.it  This test is not yet approved or cleared by the [place] FDA and has been authorized for detection and/or diagnosis of SARS-CoV-2 by FDA under an Emergency Use Authorization (EUA). This EUA will remain in effect (meaning this test can be used) for the duration of the COVID-19 declaration under Section 564(b)(1) of the Act, 21 U.S.C. section 360bbb-3(b)(1), unless the authorization is terminated or revoked.  Performed at [facility], [address], [place], [place] [address]   Blood culture (routine x 2)     Status: None (Preliminary result)   Collection Time: 02/17/21  3:50 PM   Specimen: BLOOD RIGHT HAND  Result Value Ref Range Status   Specimen Description BLOOD RIGHT HAND  Final   Special Requests   Final    BOTTLES DRAWN  AEROBIC AND ANAEROBIC Blood Culture adequate volume   Culture   Final    NO GROWTH 2 DAYS Performed at [facility], [address], [place], [place] [address]    Report Status PENDING  Incomplete  Urine culture     Status: None   Collection Time: 02/17/21  6:35 PM   Specimen: In/Out Cath Urine  Result Value Ref Range Status   Specimen Description IN/OUT CATH URINE  Final   Special Requests NONE  Final   Culture   Final    NO GROWTH Performed at [facility], [address] [place], [place] [address]    Report Status 02/19/2021 FINAL  Final     Marcos Eke, NP [facility]  02/19/2021  12:34 PM

## 2021-02-19 NOTE — Progress Notes (Signed)
Mobility Specialist: Progress Note   02/19/21 1653  Mobility  Activity Transferred to/from [facility]  Level of Assistance Moderate assist, patient does 50-74%  Assistive Device Front wheel walker  Mobility Response Tolerated well  Mobility performed by Mobility specialist  $Mobility charge 1 Mobility   Assisted pt in transferring to [facility] per RN request. Pt required significant bed elevation to stand and was minA to modA to transfer to [facility]. Pt was able to hop pivot to BSC using only RLE. Pt has call bell and was instructed to push call bell when finished.   [facility] Dwayne Rocha Mobility Specialist Mobility Specialist Phone: [phone]

## 2021-02-19 NOTE — Consult Note (Signed)
Neurology Consultation  Reason for Consult: somnolence, abnormal extremity movements Referring Physician: Nelson Chimes, s. MD  CC: somnolence, abnormal extremity movements  History is obtained from: patient, wife, chart  HPI: Dwayne Rocha is a 45 y.o. male with a PMHx of morbid obesity with BMI of 56, anxiety, depression, DM II, peripheral neuropathy, Charcot foot s/p surgery on 02/15/21, HLD, HTN, OSA, hypoventilation syndrome, and osteomyelitis of right great toe. Patient presented to the ED on 02/17/21 2 days postop with a high fever of 103F,  confusion and decreased oral intake since surgery. He was hypoxic on admission improved with O2 and Bipap.   Workup thus far reveals an ammonia level of 23, LA 0.9, glucose of 128, and a creatinine of 5.31. CK 1161 down to 1013. With IVF, creatinine in trending downward to 3.33 today (patient's baseline 1.6). Hgb 6.7 increased after 2 U PRBCs.  CTH negative for acute finding.   The following medications are being held due to AKI: statin, metformin, ACE, HCTZ and Lasix. Bipap has been used at hs. ABGs have improved. B12 level was low and B12 supplementation has been started. He was thought to have "sepsis" on admission with ? UTI and Cefepime was started for 2 days then d/c'd by ID today.   When NP walked in room and said hi, the patient exclaimed that he woke up today. He states he has not felt himself since his surgery. Was not having abnormal extremity movements at home.    ++Patient can demonstrate the abnormal movements reported. He raises his hands to test for drift and there are no tremors noted, but noted drifting of arm in measured, rhythmic quantities (like a jerk, then drop 3 inches, then jerk and drop 3 inches, etc. No movements noted to LUE or LLE (but has splint). RLE same as RUE but not as much of a jerk or a drop. Never had seizure activity. No FMHx of seizures.   Neurology asked to consult for somnolence and abnormal extremity movements. Wife  insisted on consult.   ROS: A robust ROS was performed and is negative except as noted in HPI.   Past Medical History:  Diagnosis Date  . Anxiety   . Complication of anesthesia    difficult to wake up last 2 procedures  . Depression   . Diabetes mellitus    type 2  . Diabetes mellitus with neuropathy (HCC) 05/03/2020   Has Humalog Kwikpen Insulin Pump  . Hyperlipidemia   . Hypertension   . Onychomycosis 05/03/2020  . Osteomyelitis of great toe of right foot (HCC) 05/03/2020  . Sleep apnea   Charcot left foot  Family History  Problem Relation Age of Onset  . Hypertension Mother   . Diabetes Mother   . Stroke Maternal Grandmother   . Heart attack Maternal Grandfather   . Stroke Paternal Grandmother   . Stroke Paternal Grandfather   . Diabetes Brother   . Hypertension Brother     Social History:   reports that he quit smoking about 5 years ago. His smoking use included cigarettes. He has a 6.00 pack-year smoking history. He has never used smokeless tobacco. He reports current alcohol use. He reports that he does not use drugs.  Medications  Current Facility-Administered Medications:  .  acetaminophen (TYLENOL) tablet 1,000 mg, 1,000 mg, Oral, Q6H PRN, 1,000 mg at 02/18/21 1633 **OR** acetaminophen (TYLENOL) suppository 650 mg, 650 mg, Rectal, Q4H PRN, Bobette Mo, MD .  amLODipine (NORVASC) tablet 10 mg, 10 mg, Oral,  Daily, Arnetha Courser, MD, 10 mg at 02/19/21 0851 .  Chlorhexidine Gluconate Cloth 2 % PADS 6 each, 6 each, Topical, Daily, Arnetha Courser, MD, 6 each at 02/19/21 [phone] .  cyanocobalamin ((VITAMIN B-12)) injection 1,000 mcg, 1,000 mcg, Intramuscular, Daily, Arnetha Courser, MD, 1,000 mcg at 02/19/21 0852 .  docusate sodium (COLACE) capsule 100 mg, 100 mg, Oral, BID, Bobette Mo, MD, 100 mg at 02/19/21 7893 .  enoxaparin (LOVENOX) injection 50 mg, 50 mg, Subcutaneous, Q24H, Arnetha Courser, MD, 50 mg at 02/18/21 2047 .  gabapentin (NEURONTIN) capsule 300  mg, 300 mg, Oral, TID, Arnetha Courser, MD, 300 mg at 02/19/21 1629 .  insulin aspart (novoLOG) injection 0-20 Units, 0-20 Units, Subcutaneous, TID WC, Pham, Minh Q, RPH-CPP, 4 Units at 02/19/21 1629 .  insulin aspart (novoLOG) injection 0-5 Units, 0-5 Units, Subcutaneous, QHS, Amin, Sumayya, MD .  insulin glargine (LANTUS) injection 10 Units, 10 Units, Subcutaneous, QHS, Amin, Sumayya, MD .  lactated ringers infusion, , Intravenous, Continuous, Amin, Sumayya, MD .  ondansetron (ZOFRAN) tablet 4 mg, 4 mg, Oral, Q6H PRN **OR** ondansetron (ZOFRAN) injection 4 mg, 4 mg, Intravenous, Q6H PRN, Bobette Mo, MD .  oxyCODONE (Oxy IR/ROXICODONE) immediate release tablet 5 mg, 5 mg, Oral, Q4H PRN, Bobette Mo, MD, 5 mg at 02/19/21 1629 .  pantoprazole (PROTONIX) EC tablet 40 mg, 40 mg, Oral, BID, Bobette Mo, MD, 40 mg at 02/19/21 0851 .  senna (SENOKOT) tablet 17.2 mg, 2 tablet, Oral, BID, Bobette Mo, MD, 17.2 mg at 02/19/21 0851  Exam: Current vital signs: BP (!) 154/74 (BP Location: Left Arm)   Pulse (!) 113   Temp 99.8 F (37.7 C) (Oral)   Resp 18   Ht 5\' 11"  (1.803 m)   Wt (!) 183 kg   SpO2 97%   BMI 56.27 kg/m  Vital signs in last 24 hours: Temp:  [98 F (36.7 C)-100 F (37.8 C)] 99.8 F (37.7 C) (04/11 1108) Pulse Rate:  [110-117] 113 (04/11 1108) Resp:  [10-26] 18 (04/11 1108) BP: (140-162)/(70-82) 154/74 (04/11 1108) SpO2:  [97 %-98 %] 97 % (04/11 1108)  GENERAL: Awake, alert in NAD. Morbidly obese. He is alert and oriented HEENT: Normocephalic and atraumatic LUNGS - Normal respiratory effort. Bipap machine at bedside.  CV - RRR on tele ABDOMEN - obese, Soft, nontender Ext: warm, well perfused Psych: Light affect. Appears annoyed at wife when she tries to take over conversation.   NEURO:  Mental Status: AA&Ox3  Speech/Language: speech is without dysarthria or aphasia. Naming, repetition, fluency, and comprehension intact. Cranial Nerves:  II:  PERRL_3_mm/brisk. visual fields full.  III, IV, VI: EOMI. Lid elevation symmetric and full.  V: sensation is intact and symmetrical to face.  VII: Smile is symmetrical. Able to puff cheeks and raise eyebrows.  VIII:hearing intact to voice IX, X: palate elevation is symmetric. Phonation normal.  XI: normal sternocleidomastoid and trapezius muscle strength 01-07-1971 is symmetrical without fasciculations.   Motor: 5/5 strength is all muscle groups. Did not test LLE due to splint postop.  Tone is normal. Bulk is increased.  Sensation- Intact to light touch bilaterally in all four extremities. Extinction absent to light touch to DSS.  Coordination: FTN intact bilaterally. See description of movements of RUE and RLE in HPI. No pronator drift.  Gait- deferred  Labs I have reviewed labs in epic and the results pertinent to this consultation are: B12 185. HbA1c 6.8%. Creat 3.33. Blood cx neg.   CBC  Component Value Date/Time   WBC 7.5 02/19/2021 0143   RBC 2.68 (L) 02/19/2021 0143   HGB 7.5 (L) 02/19/2021 0143   HCT 24.1 (L) 02/19/2021 0143   PLT 238 02/19/2021 0143   MCV 89.9 02/19/2021 0143   MCH 28.0 02/19/2021 0143   MCHC 31.1 02/19/2021 0143   RDW 15.4 02/19/2021 0143   LYMPHSABS 0.7 02/19/2021 0143   MONOABS 0.9 02/19/2021 0143   EOSABS 0.2 02/19/2021 0143   BASOSABS 0.0 02/19/2021 0143    CMP     Component Value Date/Time   NA 138 02/19/2021 0143   K 4.9 02/19/2021 0143   CL 109 02/19/2021 0143   CO2 20 (L) 02/19/2021 0143   GLUCOSE 173 (H) 02/19/2021 0143   BUN 44 (H) 02/19/2021 0143   CREATININE 3.33 (H) 02/19/2021 0143   CREATININE 1.69 (H) 01/04/2021 1028   CALCIUM 8.4 (L) 02/19/2021 0143   PROT 6.5 02/19/2021 0143   ALBUMIN 2.3 (L) 02/19/2021 0143   AST 19 02/19/2021 0143   ALT 11 02/19/2021 0143   ALKPHOS 75 02/19/2021 0143   BILITOT 0.9 02/19/2021 0143   GFRNONAA 23 (L) 02/19/2021 0143   GFRNONAA 49 (L) 01/04/2021 1028   GFRAA 56 (L) 01/04/2021 1028     Imaging   CT head 1. No acute intracranial abnormality. 2. 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  Assessment: 45 yo obese male who had Charcot foot surgery by orthopaedics on 02/15/21. He returned to ED on 02/17/21 with fever, hypoxia, and AKI, Foot was found no to be infected and Cefepime was d/c'd by ID. Patient was somnolent for past 2 days but reports waking up today.  His movements represent asterixis.  This could also be caused by his gabapentin toxicity. Hopefully, this will improve as the gabapentin washes out. His low B12 is unlikely to be contributing, but is certainly something that should be addressed. His somnolence/encephalopathy is likely from hypoxia, OSA, hypoventilation syndrome and severe AKI.   Impression: 1. Somolence/encepalopathy-not evident on exam.  2. Gabapentin toxicity likely cause of abnormal movements.  3. Severe AKI.  4. OSA with hypoventilation syndrome.  5. B12 deficiency.  6. Hypercarbia-likely chronic.   Recommendations/Plan: have been ordered.  -r/p ABG although suspect this to be even more improved.  -continue B12 supplementation and continue after discharge in po form.  -continue Bipap at night and Cpap/Bipap at home. Educated patient on importance of being adherent due to risk of uncontrolled HTN and stroke.  -Check TSH.  - hold Gabapentin, but could restart at a renally adjusted dose once his mental status improves. -UA, urine culture NTD. Continue to work up infectious causes of presentation as you are doing.   Pt seen by Jimmye NormanKaren Kirby-Graham, NP/Neuro and later by MD. Note/plan to be edited by MD as needed.  Pager: [phone]  I have seen the patient and reviewed the above note.  He was on gabapentin at a significantly high dose of 800 3 times daily and developed renal failure.  The somnolence and myoclonus/asterixis described are very typical of gabapentin toxicity and I would favor holding this until his renal function  and mental status improved.  He describes some transient diplopia with me, but I suspect that he might have an underlying eso phoria as this quickly cleared as he blinks his eyes and he describes this as been an ongoing problem and is more noticeable when he first wakes up.  I do not see evidence on exam of disconjugate gaze.  I do not think any further work-up is needed given the very typical presentation and clinical course.  If neurology can be of any further assistance, please do not hesitate to call.  Ritta Slot, MD [facility] [phone]  If 7pm- 7am, please page neurology on call as listed in AMION.

## 2021-02-20 ENCOUNTER — Inpatient Hospital Stay (HOSPITAL_COMMUNITY): Payer: 59

## 2021-02-20 DIAGNOSIS — E662 Morbid (severe) obesity with alveolar hypoventilation: Secondary | ICD-10-CM

## 2021-02-20 DIAGNOSIS — A419 Sepsis, unspecified organism: Secondary | ICD-10-CM | POA: Diagnosis not present

## 2021-02-20 DIAGNOSIS — R110 Nausea: Secondary | ICD-10-CM

## 2021-02-20 DIAGNOSIS — R109 Unspecified abdominal pain: Secondary | ICD-10-CM

## 2021-02-20 DIAGNOSIS — R509 Fever, unspecified: Secondary | ICD-10-CM | POA: Diagnosis not present

## 2021-02-20 DIAGNOSIS — M14672 Charcot's joint, left ankle and foot: Secondary | ICD-10-CM | POA: Diagnosis not present

## 2021-02-20 LAB — GLUCOSE, CAPILLARY
Glucose-Capillary: 180 mg/dL — ABNORMAL HIGH (ref 70–99)
Glucose-Capillary: 174 mg/dL — ABNORMAL HIGH (ref 70–99)
Glucose-Capillary: 190 mg/dL — ABNORMAL HIGH (ref 70–99)
Glucose-Capillary: 125 mg/dL — ABNORMAL HIGH (ref 70–99)

## 2021-02-20 LAB — PHOSPHORUS: Phosphorus: 3.1 mg/dL (ref 2.5–4.6)

## 2021-02-20 LAB — CBC WITH DIFFERENTIAL/PLATELET
Abs Immature Granulocytes: 0.02 10*3/uL (ref 0.00–0.07)
Basophils Absolute: 0 10*3/uL (ref 0.0–0.1)
Basophils Relative: 1 %
Eosinophils Absolute: 0.2 10*3/uL (ref 0.0–0.5)
Eosinophils Relative: 4 %
HCT: 24.9 % — ABNORMAL LOW (ref 39.0–52.0)
Hemoglobin: 7.8 g/dL — ABNORMAL LOW (ref 13.0–17.0)
Immature Granulocytes: 0 %
Lymphocytes Relative: 18 %
Lymphs Abs: 1.1 10*3/uL (ref 0.7–4.0)
MCH: 27.9 pg (ref 26.0–34.0)
MCHC: 31.3 g/dL (ref 30.0–36.0)
MCV: 88.9 fL (ref 80.0–100.0)
Monocytes Absolute: 0.8 10*3/uL (ref 0.1–1.0)
Monocytes Relative: 12 %
Neutro Abs: 4.2 10*3/uL (ref 1.7–7.7)
Neutrophils Relative %: 65 %
Platelets: 262 10*3/uL (ref 150–400)
RBC: 2.8 MIL/uL — ABNORMAL LOW (ref 4.22–5.81)
RDW: 14.9 % (ref 11.5–15.5)
WBC: 6.3 10*3/uL (ref 4.0–10.5)
nRBC: 0 % (ref 0.0–0.2)

## 2021-02-20 LAB — COMPREHENSIVE METABOLIC PANEL
ALT: 13 U/L (ref 0–44)
AST: 17 U/L (ref 15–41)
Albumin: 2.3 g/dL — ABNORMAL LOW (ref 3.5–5.0)
Alkaline Phosphatase: 85 U/L (ref 38–126)
Anion gap: 9 (ref 5–15)
BUN: 28 mg/dL — ABNORMAL HIGH (ref 6–20)
CO2: 23 mmol/L (ref 22–32)
Calcium: 8.6 mg/dL — ABNORMAL LOW (ref 8.9–10.3)
Chloride: 105 mmol/L (ref 98–111)
Creatinine, Ser: 2.01 mg/dL — ABNORMAL HIGH (ref 0.61–1.24)
GFR, Estimated: 41 mL/min — ABNORMAL LOW (ref >60)
Glucose, Bld: 172 mg/dL — ABNORMAL HIGH (ref 70–99)
Potassium: 4.4 mmol/L (ref 3.5–5.1)
Sodium: 137 mmol/L (ref 135–145)
Total Bilirubin: 0.6 mg/dL (ref 0.3–1.2)
Total Protein: 6.7 g/dL (ref 6.5–8.1)

## 2021-02-20 LAB — CK: Total CK: 478 U/L — ABNORMAL HIGH (ref 49–397)

## 2021-02-20 LAB — PROCALCITONIN: Procalcitonin: 0.18 ng/mL

## 2021-02-20 MED ORDER — LISINOPRIL 40 MG PO TABS
40.0000 mg | ORAL_TABLET | Freq: Every day | ORAL | Status: DC
  Administered 2021-02-20 – 2021-02-21 (×2): 40 mg via ORAL
  Filled 2021-02-20 (×3): qty 1

## 2021-02-20 MED ORDER — ALUM & MAG HYDROXIDE-SIMETH 200-200-20 MG/5ML PO SUSP
30.0000 mL | Freq: Once | ORAL | Status: AC
  Administered 2021-02-20: 30 mL via ORAL
  Filled 2021-02-20: qty 30

## 2021-02-20 MED ORDER — LIDOCAINE VISCOUS HCL 2 % MT SOLN
15.0000 mL | Freq: Once | OROMUCOSAL | Status: AC
  Administered 2021-02-20: 15 mL via ORAL
  Filled 2021-02-20: qty 15

## 2021-02-20 MED ORDER — METOPROLOL TARTRATE 12.5 MG HALF TABLET
12.5000 mg | ORAL_TABLET | Freq: Two times a day (BID) | ORAL | Status: DC
  Administered 2021-02-20 – 2021-02-22 (×5): 12.5 mg via ORAL
  Filled 2021-02-20 (×6): qty 1
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
## 2021-02-20 NOTE — Progress Notes (Signed)
OT Cancellation Note  Patient Details Name: Dwayne Rocha MRN: [id number] DOB: 1976/06/12   Cancelled Treatment:    Reason Eval/Treat Not Completed: Patient at procedure or test/ unavailable Pt off unit for x-ray. OT will return as time allows and pt is appropriate.   [facility] OTR/L Acute Rehabilitation Services Office: [phone]   Rebeca Alert 02/20/2021, 11:01 AM

## 2021-02-20 NOTE — Progress Notes (Signed)
Subjective: He is complaining of some nausea and abdominal pain today   Antibiotics:  Anti-infectives (From admission, onward)   Start     Dose/Rate Route Frequency Ordered Stop   02/19/21 0600  ceFEPIme (MAXIPIME) 2 g in sodium chloride 0.9 % 100 mL IVPB  Status:  Discontinued        2 g 200 mL/hr over 30 Minutes Intravenous Every 24 hours 02/18/21 0946 02/19/21 1648   02/18/21 1600  vancomycin (VANCOCIN) 2,250 mg in sodium chloride 0.9 % 500 mL IVPB  Status:  Discontinued        2,250 mg 250 mL/hr over 120 Minutes Intravenous Every 24 hours 02/17/21 1550 02/18/21 0704   02/18/21 0200  metroNIDAZOLE (FLAGYL) IVPB 500 mg  Status:  Discontinued        500 mg 100 mL/hr over 60 Minutes Intravenous Every 8 hours 02/18/21 0040 02/19/21 1326   02/17/21 2300  ceFEPIme (MAXIPIME) 2 g in sodium chloride 0.9 % 100 mL IVPB  Status:  Discontinued        2 g 200 mL/hr over 30 Minutes Intravenous Every 8 hours 02/17/21 1550 02/18/21 0946   02/17/21 1600  vancomycin (VANCOCIN) 2,500 mg in sodium chloride 0.9 % 500 mL IVPB        2,500 mg 250 mL/hr over 120 Minutes Intravenous  Once 02/17/21 1541 02/17/21 2155   02/17/21 1545  ceFEPIme (MAXIPIME) 2 g in sodium chloride 0.9 % 100 mL IVPB        2 g 200 mL/hr over 30 Minutes Intravenous  Once 02/17/21 1541 02/17/21 1730   02/17/21 1545  metroNIDAZOLE (FLAGYL) IVPB 500 mg        500 mg 100 mL/hr over 60 Minutes Intravenous  Once 02/17/21 1541 02/17/21 1707      Medications: Scheduled Meds: . amLODipine  10 mg Oral Daily  . Chlorhexidine Gluconate Cloth  6 each Topical Daily  . cyanocobalamin  1,000 mcg Intramuscular Daily  . docusate sodium  100 mg Oral BID  . enoxaparin (LOVENOX) injection  50 mg Subcutaneous Q24H  . insulin aspart  0-20 Units Subcutaneous TID WC  . insulin aspart  0-5 Units Subcutaneous QHS  . insulin glargine  10 Units Subcutaneous QHS  . metoprolol tartrate  12.5 mg Oral BID  . pantoprazole  40 mg Oral BID   . senna  2 tablet Oral BID   Continuous Infusions: PRN Meds:.acetaminophen **OR** acetaminophen, ondansetron **OR** ondansetron (ZOFRAN) IV, oxyCODONE    Objective: Weight change:   Intake/Output Summary (Last 24 hours) at 02/20/2021 1124 Last data filed at 02/20/2021 0850 Gross per 24 hour  Intake 122 ml  Output 2550 ml  Net -2428 ml   Blood pressure (!) 170/85, pulse 100, temperature 97.7 F (36.5 C), temperature source Oral, resp. rate 14, height 5\' 11"  (1.803 m), weight (!) 183 kg, SpO2 100 %. Temp:  [97.7 F (36.5 C)-99.7 F (37.6 C)] 97.7 F (36.5 C) (04/12 0843) Pulse Rate:  [100-107] 100 (04/12 0843) Resp:  [13-18] 14 (04/12 0843) BP: (155-182)/(78-88) 170/85 (04/12 0843) SpO2:  [97 %-100 %] 100 % (04/12 0843)  Physical Exam: Physical Exam Constitutional:      Appearance: He is well-developed.  HENT:     Head: Normocephalic and atraumatic.  Eyes:     Conjunctiva/sclera: Conjunctivae normal.  Cardiovascular:     Rate and Rhythm: Normal rate and regular rhythm.     Heart sounds: No murmur heard. No friction rub.  No gallop.   Pulmonary:     Effort: Pulmonary effort is normal. No respiratory distress.     Breath sounds: Normal breath sounds. No stridor. No wheezing.  Abdominal:     General: There is no distension.     Palpations: Abdomen is soft. There is no mass.  Musculoskeletal:        General: Normal range of motion.     Cervical back: Normal range of motion and neck supple.  Skin:    General: Skin is warm and dry.     Findings: No erythema or rash.  Neurological:     General: No focal deficit present.     Mental Status: He is alert and oriented to person, place, and time.  Psychiatric:        Mood and Affect: Mood normal.        Behavior: Behavior normal.        Thought Content: Thought content normal.        Judgment: Judgment normal.     Left foot is dressed    CBC:    BMET Recent Labs    02/19/21 0143 02/20/21 0344  NA 138 137  K  4.9 4.4  CL 109 105  CO2 20* 23  GLUCOSE 173* 172*  BUN 44* 28*  CREATININE 3.33* 2.01*  CALCIUM 8.4* 8.6*     Liver Panel  Recent Labs    02/19/21 0143 02/20/21 0344  PROT 6.5 6.7  ALBUMIN 2.3* 2.3*  AST 19 17  ALT 11 13  ALKPHOS 75 85  BILITOT 0.9 0.6       Sedimentation Rate No results for input(s): ESRSEDRATE in the last 72 hours. C-Reactive Protein No results for input(s): CRP in the last 72 hours.  Micro Results: Recent Results (from the past 720 hour(s))  SARS CORONAVIRUS 2 (TAT 6-24 HRS) Nasopharyngeal Nasopharyngeal Swab     Status: None   Collection Time: 02/12/21  9:40 AM   Specimen: Nasopharyngeal Swab  Result Value Ref Range Status   SARS Coronavirus 2 NEGATIVE NEGATIVE Final    Comment: (NOTE) SARS-CoV-2 target nucleic acids are NOT DETECTED.  The SARS-CoV-2 RNA is generally detectable in upper and lower respiratory specimens during the acute phase of infection. Negative results do not preclude SARS-CoV-2 infection, do not rule out co-infections with other pathogens, and should not be used as the sole basis for treatment or other patient management decisions. Negative results must be combined with clinical observations, patient history, and epidemiological information. The expected result is Negative.  Fact Sheet for Patients: HairSlick.no  Fact Sheet for Healthcare Providers: quierodirigir.com  This test is not yet approved or cleared by the [place] FDA and  has been authorized for detection and/or diagnosis of SARS-CoV-2 by FDA under an Emergency Use Authorization (EUA). This EUA will remain  in effect (meaning this test can be used) for the duration of the COVID-19 declaration under Se ction 564(b)(1) of the Act, 21 U.S.C. section 360bbb-3(b)(1), unless the authorization is terminated or revoked sooner.  Performed at [facility], [address] [place],  [place] [address]   Surgical pcr screen     Status: None   Collection Time: 02/15/21  5:41 AM   Specimen: Nasal Mucosa; Nasal Swab  Result Value Ref Range Status   MRSA, PCR NEGATIVE NEGATIVE Final   Staphylococcus aureus NEGATIVE NEGATIVE Final    Comment: (NOTE) The Xpert SA Assay (FDA approved for NASAL specimens in patients 55 years of age  and older), is one component of a comprehensive surveillance program. It is not intended to diagnose infection nor to guide or monitor treatment. Performed at [facility], [address], [place], [place] [address]   Blood culture (routine x 2)     Status: None (Preliminary result)   Collection Time: 02/17/21  3:36 PM   Specimen: BLOOD RIGHT FOREARM  Result Value Ref Range Status   Specimen Description BLOOD RIGHT FOREARM  Final   Special Requests   Final    BOTTLES DRAWN AEROBIC AND ANAEROBIC Blood Culture adequate volume   Culture   Final    NO GROWTH 3 DAYS Performed at [facility], [address] [place], [place] [address]    Report Status PENDING  Incomplete  Resp Panel by RT-PCR (Flu A&B, Covid) Nasopharyngeal Swab     Status: None   Collection Time: 02/17/21  3:41 PM   Specimen: Nasopharyngeal Swab; Nasopharyngeal(NP) swabs in vial transport medium  Result Value Ref Range Status   SARS Coronavirus 2 by RT PCR NEGATIVE NEGATIVE Final    Comment: (NOTE) SARS-CoV-2 target nucleic acids are NOT DETECTED.  The SARS-CoV-2 RNA is generally detectable in upper respiratory specimens during the acute phase of infection. The lowest concentration of SARS-CoV-2 viral copies this assay can detect is 138 copies/mL. A negative result does not preclude SARS-Cov-2 infection and should not be used as the sole basis for treatment or other patient management decisions. A negative result may occur with  improper specimen collection/handling, submission of specimen other than nasopharyngeal swab, presence of viral mutation(s) within the areas  targeted by this assay, and inadequate number of viral copies(<138 copies/mL). A negative result must be combined with clinical observations, patient history, and epidemiological information. The expected result is Negative.  Fact Sheet for Patients:  BloggerCourse.com  Fact Sheet for Healthcare Providers:  SeriousBroker.it  This test is no t yet approved or cleared by the [place] FDA and  has been authorized for detection and/or diagnosis of SARS-CoV-2 by FDA under an Emergency Use Authorization (EUA). This EUA will remain  in effect (meaning this test can be used) for the duration of the COVID-19 declaration under Section 564(b)(1) of the Act, 21 U.S.C.section 360bbb-3(b)(1), unless the authorization is terminated  or revoked sooner.       Influenza A by PCR NEGATIVE NEGATIVE Final   Influenza B by PCR NEGATIVE NEGATIVE Final    Comment: (NOTE) The Xpert Xpress SARS-CoV-2/FLU/RSV plus assay is intended as an aid in the diagnosis of influenza from Nasopharyngeal swab specimens and should not be used as a sole basis for treatment. Nasal washings and aspirates are unacceptable for Xpert Xpress SARS-CoV-2/FLU/RSV testing.  Fact Sheet for Patients: BloggerCourse.com  Fact Sheet for Healthcare Providers: SeriousBroker.it  This test is not yet approved or cleared by the [place] FDA and has been authorized for detection and/or diagnosis of SARS-CoV-2 by FDA under an Emergency Use Authorization (EUA). This EUA will remain in effect (meaning this test can be used) for the duration of the COVID-19 declaration under Section 564(b)(1) of the Act, 21 U.S.C. section 360bbb-3(b)(1), unless the authorization is terminated or revoked.  Performed at [facility], [address] [place], [place] [address]   Blood culture (routine x 2)     Status: None (Preliminary result)    Collection Time: 02/17/21  3:50 PM   Specimen: BLOOD RIGHT HAND  Result Value Ref Range Status   Specimen Description BLOOD RIGHT  HAND  Final   Special Requests   Final    BOTTLES DRAWN AEROBIC AND ANAEROBIC Blood Culture adequate volume   Culture   Final    NO GROWTH 3 DAYS Performed at [facility], [address] [place], [place] [address]    Report Status PENDING  Incomplete  Urine culture     Status: None   Collection Time: 02/17/21  6:35 PM   Specimen: In/Out Cath Urine  Result Value Ref Range Status   Specimen Description IN/OUT CATH URINE  Final   Special Requests NONE  Final   Culture   Final    NO GROWTH Performed at [facility], [address] [place], [place] [address]    Report Status 02/19/2021 FINAL  Final    Studies/Results: DG Ankle Complete Left  Result Date: 02/19/2021 CLINICAL DATA:  Four days postop EXAM: LEFT ANKLE COMPLETE - 3+ VIEW COMPARISON:  02/15/2021, 08/03/2020 FINDINGS: Casting material limits bone detail. Patient is status post intramedullary rodding of the distal tibia and calcaneus with anterior to posterior oriented long fixating screw also evident in the calcaneus. Frank bony destructive change of the talus and tarsal bones with multiple displaced bone fragments. Probable bony destructive change involving distal fibula and medial malleolar region of the tibia. Alignment grossly stable as compared with limited intraoperative views. IMPRESSION: Status post intramedullary rodding of the distal tibia and calcaneus with fixating screw in the calcaneus. Homero FellersFrank bony destructive change of the talus and tarsal bones with probable destructive changes at the distal fibula and medial malleolar region of the tibia. Alignment grossly stable as compared with limited intraoperative views. Electronically Signed   By: Jasmine PangKim  Fujinaga M.D.   On: 02/19/2021 20:18   VAS US LOWER EXTREMITY VENOUS (DVT)  Result Date: 02/18/2021  Lower Venous DVT Study  Indications: Swelling. Other Indications: Status post Open treatment of left subtalar dislocation, left                    talonavicular joint dislocation, left navicular tibial                    arthrodesis and left calcaneal tibial arthrodesis 02/15/21. Limitations: Body habitus, bandages and significant edema. Comparison Study: Prior negative Left lower extremity venous duplex done                   01/05/21 Performing Technologist: Sherren Kernsandace Kanady RVS  Examination Guidelines: A complete evaluation includes B-mode imaging, spectral Doppler, color Doppler, and power Doppler as needed of all accessible portions of each vessel. Bilateral testing is considered an integral part of a complete examination. Limited examinations for reoccurring indications may be performed as noted. The reflux portion of the exam is performed with the patient in reverse Trendelenburg.  Right Technical Findings: Right leg not evaluated.  +---------+---------------+---------+-----------+----------+-------------------+ LEFT     CompressibilityPhasicitySpontaneityPropertiesThrombus Aging      +---------+---------------+---------+-----------+----------+-------------------+ CFV                     Yes      Yes                  patent by color and  Doppler             +---------+---------------+---------+-----------+----------+-------------------+ FV Prox                 Yes      Yes                  patent by color and                                                       Doppler             +---------+---------------+---------+-----------+----------+-------------------+ FV Mid                  Yes      Yes                  patent by color and                                                       Doppler             +---------+---------------+---------+-----------+----------+-------------------+ FV Distal               Yes      Yes                   patent by color and                                                       Doppler             +---------+---------------+---------+-----------+----------+-------------------+ PFV                                                   Not well visualized +---------+---------------+---------+-----------+----------+-------------------+ POP                     Yes      Yes                  patent by color and                                                       Doppler             +---------+---------------+---------+-----------+----------+-------------------+ PTV                                                   Not well visualized +---------+---------------+---------+-----------+----------+-------------------+ [facility]  Not well visualized +---------+---------------+---------+-----------+----------+-------------------+     Summary: LEFT: - There is no evidence of deep vein thrombosis in the lower extremity. However, portions of this examination were limited- see technologist comments above.  *See table(s) above for measurements and observations. Electronically signed by Waverly Ferrari MD on 02/18/2021 at 4:05:05 PM.    Final       Assessment/Plan:  INTERVAL HISTORY:   Plain films of ankle have been performed  Principal Problem:   Sepsis due to undetermined organism [facility]) Active Problems:   Essential hypertension   Uncontrolled type 2 diabetes mellitus with hyperglycemia (HCC)   Charcot's joint of ankle, left   Sleep apnea   Class 3 obesity   Acute renal failure superimposed on stage 3a chronic kidney disease (HCC)   Normocytic anemia   Hyponatremia   Hypoalbuminemia   Severe protein-calorie malnutrition (HCC)   Elevated CK    Dwayne Rocha is a 45 y.o. male with diabetes mellitus obesity hypoventilation syndrome likely and obstructive sleep apnea who is now 5 days postoperatively from a left  Tilia talocalcaneal nailing Charcot foot foot who developed fever hypoxia and acute kidney injury.  CPK was quite elevated as well on admission.  #1 fevers of unknown origin: Orthopedics did not feel that his foot was infected when examined at the bedside  I ordered an MRI of has not yet been done.  Plain films of the ankle are showing:  Intramedullary rodding of distal tibia and calcaneus with fixating screws in the calcaneus.  There is also evidence of frank bony destructive changes in the talus tarsal bones as well as probable destructive changes in the fibula and medial malleoli are region of the tibia  Does this represent osteomyelitis at this site or are these changes from Charcot  Would recommend discuss with Orthopedics and Dr. Laverta Baltimore team  Continue to monitor off antibiotics  We will order a sed rate and CRP since that was not done recently  #2 nausea and abdominal pain defer to primary team he is going to try a GI cocktail and plain films of the abdomen are going to be obtained  #3 likely obesity hypoventilation syndrome: I suspect this is responsible for his hypoxia      LOS: 3 days   Dwayne Rocha 02/20/2021, 11:24 AM
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
## 2021-02-20 NOTE — Progress Notes (Signed)
Pt in no distress at this time requiring PRN bipap   RT spoke with pt about cpap and pt stated he was told he has sleep apnea and needs a sleep study.  Pt encouraged to schedule that asap upon discharge.  RT will continue to monitor.

## 2021-02-21 DIAGNOSIS — N179 Acute kidney failure, unspecified: Secondary | ICD-10-CM

## 2021-02-21 DIAGNOSIS — A419 Sepsis, unspecified organism: Secondary | ICD-10-CM | POA: Diagnosis not present

## 2021-02-21 DIAGNOSIS — M86072 Acute hematogenous osteomyelitis, left ankle and foot: Secondary | ICD-10-CM

## 2021-02-21 DIAGNOSIS — R4182 Altered mental status, unspecified: Secondary | ICD-10-CM

## 2021-02-21 DIAGNOSIS — M86672 Other chronic osteomyelitis, left ankle and foot: Secondary | ICD-10-CM

## 2021-02-21 DIAGNOSIS — N172 Acute kidney failure with medullary necrosis: Secondary | ICD-10-CM | POA: Diagnosis not present

## 2021-02-21 DIAGNOSIS — E8809 Other disorders of plasma-protein metabolism, not elsewhere classified: Secondary | ICD-10-CM

## 2021-02-21 DIAGNOSIS — M14672 Charcot's joint, left ankle and foot: Secondary | ICD-10-CM | POA: Diagnosis not present

## 2021-02-21 DIAGNOSIS — E669 Obesity, unspecified: Secondary | ICD-10-CM | POA: Diagnosis not present

## 2021-02-21 LAB — CBC
HCT: 23.2 % — ABNORMAL LOW (ref 39.0–52.0)
Hemoglobin: 7.3 g/dL — ABNORMAL LOW (ref 13.0–17.0)
MCH: 28.1 pg (ref 26.0–34.0)
MCHC: 31.5 g/dL (ref 30.0–36.0)
MCV: 89.2 fL (ref 80.0–100.0)
Platelets: 250 10*3/uL (ref 150–400)
RBC: 2.6 MIL/uL — ABNORMAL LOW (ref 4.22–5.81)
RDW: 14.7 % (ref 11.5–15.5)
WBC: 6.1 10*3/uL (ref 4.0–10.5)
nRBC: 0 % (ref 0.0–0.2)

## 2021-02-21 LAB — GLUCOSE, CAPILLARY
Glucose-Capillary: 77 mg/dL (ref 70–99)
Glucose-Capillary: 121 mg/dL — ABNORMAL HIGH (ref 70–99)
Glucose-Capillary: 198 mg/dL — ABNORMAL HIGH (ref 70–99)
Glucose-Capillary: 137 mg/dL — ABNORMAL HIGH (ref 70–99)
Glucose-Capillary: 151 mg/dL — ABNORMAL HIGH (ref 70–99)

## 2021-02-21 LAB — COMPREHENSIVE METABOLIC PANEL
ALT: 12 U/L (ref 0–44)
AST: 17 U/L (ref 15–41)
Albumin: 2 g/dL — ABNORMAL LOW (ref 3.5–5.0)
Alkaline Phosphatase: 75 U/L (ref 38–126)
Anion gap: 5 (ref 5–15)
BUN: 22 mg/dL — ABNORMAL HIGH (ref 6–20)
CO2: 26 mmol/L (ref 22–32)
Calcium: 8.4 mg/dL — ABNORMAL LOW (ref 8.9–10.3)
Chloride: 107 mmol/L (ref 98–111)
Creatinine, Ser: 1.75 mg/dL — ABNORMAL HIGH (ref 0.61–1.24)
GFR, Estimated: 49 mL/min — ABNORMAL LOW (ref >60)
Glucose, Bld: 128 mg/dL — ABNORMAL HIGH (ref 70–99)
Potassium: 4.4 mmol/L (ref 3.5–5.1)
Sodium: 138 mmol/L (ref 135–145)
Total Bilirubin: 0.6 mg/dL (ref 0.3–1.2)
Total Protein: 5.9 g/dL — ABNORMAL LOW (ref 6.5–8.1)

## 2021-02-21 LAB — C-REACTIVE PROTEIN: CRP: 10.1 mg/dL — ABNORMAL HIGH (ref <1.0)

## 2021-02-21 LAB — SEDIMENTATION RATE: Sed Rate: >140 mm/hr — ABNORMAL HIGH (ref 0–16)

## 2021-02-21 MED ORDER — KCL IN DEXTROSE-NACL 20-5-0.45 MEQ/L-%-% IV SOLN
INTRAVENOUS | Status: DC
  Filled 2021-02-21: qty 1000

## 2021-02-21 MED ORDER — SIMVASTATIN 20 MG PO TABS
10.0000 mg | ORAL_TABLET | Freq: Every day | ORAL | Status: DC
  Administered 2021-02-21: 10 mg via ORAL
  Filled 2021-02-21: qty 1

## 2021-02-21 MED ORDER — GABAPENTIN 400 MG PO CAPS
800.0000 mg | ORAL_CAPSULE | Freq: Two times a day (BID) | ORAL | Status: DC
  Administered 2021-02-21: 800 mg via ORAL
  Filled 2021-02-21: qty 2

## 2021-02-21 MED ORDER — ENOXAPARIN SODIUM 100 MG/ML ~~LOC~~ SOLN
90.0000 mg | SUBCUTANEOUS | Status: DC
  Filled 2021-02-21: qty 0.9
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
## 2021-02-21 NOTE — Progress Notes (Signed)
[facility] for Infectious Disease  Date of Admission:  02/17/2021     Total days of antibiotics 3         ASSESSMENT:  Mr. Smalls is POD #6 from tibiotalocalcalceneal nailing of the left foot. MRI concerning for osteomyelitis with elevated inflammatory markers (ESR >140 and CRP 10.1).  Dr. Doran Durand evaluated and has planned irrigation and debridement of the left ankle tomorrow. Antibiotics have been held to optimize any potential culture yields. Dicussed plan of care with recommendations for 6 weeks of antibiotics. Continue to hold antibiotics until after surgery tomorrow. Hypoxia appears to be improved likely will need home CPAP.   PLAN:  1. Continue to hold antibiotics 2. I&D planned for tomorrow with Dr. Doran Durand 3. Continued hypoxia management per primary team.   Principal Problem:   Sepsis due to undetermined organism [facility]) Active Problems:   Essential hypertension   Uncontrolled type 2 diabetes mellitus with hyperglycemia (HCC)   Charcot's joint of ankle, left   Sleep apnea   Class 3 obesity   Acute renal failure superimposed on stage 3a chronic kidney disease (HCC)   Normocytic anemia   Hyponatremia   Hypoalbuminemia   Severe protein-calorie malnutrition (HCC)   Elevated CK   . amLODipine  10 mg Oral Daily  . Chlorhexidine Gluconate Cloth  6 each Topical Daily  . cyanocobalamin  1,000 mcg Intramuscular Daily  . docusate sodium  100 mg Oral BID  . gabapentin  800 mg Oral BID  . insulin aspart  0-20 Units Subcutaneous TID WC  . insulin aspart  0-5 Units Subcutaneous QHS  . insulin glargine  10 Units Subcutaneous QHS  . lisinopril  40 mg Oral Daily  . metoprolol tartrate  12.5 mg Oral BID  . pantoprazole  40 mg Oral BID  . senna  2 tablet Oral BID  . simvastatin  10 mg Oral q1800    SUBJECTIVE:  Afebrile overnight with no acute events. MRI with concern for osteomyelitis. Sed rate >140 and CRP of 10.1. Having some pain in his left ankle. Denies fevers/chills.    Allergies  Allergen Reactions  . Lyrica [Pregabalin] Swelling     Review of Systems: Review of Systems  Constitutional: Negative for chills, fever and weight loss.  Respiratory: Negative for cough, shortness of breath and wheezing.   Cardiovascular: Negative for chest pain and leg swelling.  Gastrointestinal: Negative for abdominal pain, constipation, diarrhea, nausea and vomiting.  Skin: Negative for rash.      OBJECTIVE: Vitals:   02/20/21 2100 02/21/21 0158 02/21/21 0612 02/21/21 1348  BP: (!) 153/82 (!) 158/81 (!) 186/95 (!) 147/80  Pulse: (!) 101 86 97 91  Resp: 19 15 16 16   Temp: 99.6 F (37.6 C) 98.4 F (36.9 C) 98.6 F (37 C) 98.6 F (37 C)  TempSrc: Oral Oral Oral Oral  SpO2: 99% 97% 98% 100%  Weight:      Height:       Body mass index is 56.27 kg/m.  Physical Exam Constitutional:      General: He is not in acute distress.    Appearance: He is well-developed. He is obese.     Comments: Lying in bed with head of bed elevated; pleasant.   Cardiovascular:     Rate and Rhythm: Normal rate and regular rhythm.     Heart sounds: Normal heart sounds.  Pulmonary:     Effort: Pulmonary effort is normal.     Breath sounds: Normal breath sounds.  Skin:  General: Skin is warm and dry.  Neurological:     Mental Status: He is alert and oriented to person, place, and time.  Psychiatric:        Behavior: Behavior normal.        Thought Content: Thought content normal.        Judgment: Judgment normal.     Lab Results Lab Results  Component Value Date   WBC 6.1 02/21/2021   HGB 7.3 (L) 02/21/2021   HCT 23.2 (L) 02/21/2021   MCV 89.2 02/21/2021   PLT 250 02/21/2021    Lab Results  Component Value Date   CREATININE 1.75 (H) 02/21/2021   BUN 22 (H) 02/21/2021   NA 138 02/21/2021   K 4.4 02/21/2021   CL 107 02/21/2021   CO2 26 02/21/2021    Lab Results  Component Value Date   ALT 12 02/21/2021   AST 17 02/21/2021   ALKPHOS 75 02/21/2021    BILITOT 0.6 02/21/2021     Microbiology: Recent Results (from the past 240 hour(s))  SARS CORONAVIRUS 2 (TAT 6-24 HRS) Nasopharyngeal Nasopharyngeal Swab     Status: None   Collection Time: 02/12/21  9:40 AM   Specimen: Nasopharyngeal Swab  Result Value Ref Range Status   SARS Coronavirus 2 NEGATIVE NEGATIVE Final    Comment: (NOTE) SARS-CoV-2 target nucleic acids are NOT DETECTED.  The SARS-CoV-2 RNA is generally detectable in upper and lower respiratory specimens during the acute phase of infection. Negative results do not preclude SARS-CoV-2 infection, do not rule out co-infections with other pathogens, and should not be used as the sole basis for treatment or other patient management decisions. Negative results must be combined with clinical observations, patient history, and epidemiological information. The expected result is Negative.  Fact Sheet for Patients: SugarRoll.be  Fact Sheet for Healthcare Providers: https://www.woods-mathews.com/  This test is not yet approved or cleared by the [place] FDA and  has been authorized for detection and/or diagnosis of SARS-CoV-2 by FDA under an Emergency Use Authorization (EUA). This EUA will remain  in effect (meaning this test can be used) for the duration of the COVID-19 declaration under Se ction 564(b)(1) of the Act, 21 U.S.C. section 360bbb-3(b)(1), unless the authorization is terminated or revoked sooner.  Performed at [facility], [place] [address], [place], [place] [address]   Surgical pcr screen     Status: None   Collection Time: 02/15/21  5:41 AM   Specimen: Nasal Mucosa; Nasal Swab  Result Value Ref Range Status   MRSA, PCR NEGATIVE NEGATIVE Final   Staphylococcus aureus NEGATIVE NEGATIVE Final    Comment: (NOTE) The Xpert SA Assay (FDA approved for NASAL specimens in patients 59 years of age and older), is one component of a comprehensive surveillance program.  It is not intended to diagnose infection nor to guide or monitor treatment. Performed at [facility], [place] [address], [place], [place] [address]   Blood culture (routine x 2)     Status: None (Preliminary result)   Collection Time: 02/17/21  3:36 PM   Specimen: BLOOD RIGHT FOREARM  Result Value Ref Range Status   Specimen Description BLOOD RIGHT FOREARM  Final   Special Requests   Final    BOTTLES DRAWN AEROBIC AND ANAEROBIC Blood Culture adequate volume   Culture   Final    NO GROWTH 4 DAYS Performed at [facility], [place] [address], [place], [place] [address]    Report Status PENDING  Incomplete  Resp Panel by RT-PCR (Flu A&B, Covid) Nasopharyngeal Swab     Status: None   Collection Time: 02/17/21  3:41 PM   Specimen: Nasopharyngeal Swab; Nasopharyngeal(NP) swabs in vial transport medium  Result Value Ref Range Status   SARS Coronavirus 2 by RT PCR NEGATIVE NEGATIVE Final    Comment: (NOTE) SARS-CoV-2 target nucleic acids are NOT DETECTED.  The SARS-CoV-2 RNA is generally detectable in upper respiratory specimens during the acute phase of infection. The lowest concentration of SARS-CoV-2 viral copies this assay can detect is 138 copies/mL. A negative result does not preclude SARS-Cov-2 infection and should not be used as the sole basis for treatment or other patient management decisions. A negative result may occur with  improper specimen collection/handling, submission of specimen other than nasopharyngeal swab, presence of viral mutation(s) within the areas targeted by this assay, and inadequate number of viral copies(<138 copies/mL). A negative result must be combined with clinical observations, patient history, and epidemiological information. The expected result is Negative.  Fact Sheet for Patients:  EntrepreneurPulse.com.au  Fact Sheet for Healthcare Providers:  IncredibleEmployment.be  This test is no t yet approved  or cleared by the [place] FDA and  has been authorized for detection and/or diagnosis of SARS-CoV-2 by FDA under an Emergency Use Authorization (EUA). This EUA will remain  in effect (meaning this test can be used) for the duration of the COVID-19 declaration under Section 564(b)(1) of the Act, 21 U.S.C.section 360bbb-3(b)(1), unless the authorization is terminated  or revoked sooner.       Influenza A by PCR NEGATIVE NEGATIVE Final   Influenza B by PCR NEGATIVE NEGATIVE Final    Comment: (NOTE) The Xpert Xpress SARS-CoV-2/FLU/RSV plus assay is intended as an aid in the diagnosis of influenza from Nasopharyngeal swab specimens and should not be used as a sole basis for treatment. Nasal washings and aspirates are unacceptable for Xpert Xpress SARS-CoV-2/FLU/RSV testing.  Fact Sheet for Patients: EntrepreneurPulse.com.au  Fact Sheet for Healthcare Providers: IncredibleEmployment.be  This test is not yet approved or cleared by the [place] FDA and has been authorized for detection and/or diagnosis of SARS-CoV-2 by FDA under an Emergency Use Authorization (EUA). This EUA will remain in effect (meaning this test can be used) for the duration of the COVID-19 declaration under Section 564(b)(1) of the Act, 21 U.S.C. section 360bbb-3(b)(1), unless the authorization is terminated or revoked.  Performed at [facility], [place] [address] [place], [place] [address]   Blood culture (routine x 2)     Status: None (Preliminary result)   Collection Time: 02/17/21  3:50 PM   Specimen: BLOOD RIGHT HAND  Result Value Ref Range Status   Specimen Description BLOOD RIGHT HAND  Final   Special Requests   Final    BOTTLES DRAWN AEROBIC AND ANAEROBIC Blood Culture adequate volume   Culture   Final    NO GROWTH 4 DAYS Performed at [facility], [place] [address] [place], [place] [address]    Report Status PENDING  Incomplete  Urine culture      Status: None   Collection Time: 02/17/21  6:35 PM   Specimen: In/Out Cath Urine  Result Value Ref Range Status   Specimen Description IN/OUT CATH URINE  Final   Special Requests NONE  Final   Culture   Final    NO GROWTH Performed at [facility], [place] [address] [place], [place] [address]    Report Status 02/19/2021 FINAL  Final     Terri Piedra, [facility]  02/21/2021  4:17 PM

## 2021-02-21 NOTE — Progress Notes (Signed)
Pt returned to 6N26 from MRI

## 2021-02-21 NOTE — Progress Notes (Addendum)
Subjective:   Procedure(s) (LRB): Irrigation and debridement left ankle (Left) Patient reports pain as mild.  Dr. Daiva Eves contacted me earlier today with concern about the patient's persistent fever and lack of any other source of infection.  Pt denies any significant pain in the ankle.  He is off abx.  Objective: Vital signs in last 24 hours: Temp:  [98.4 F (36.9 C)-99.6 F (37.6 C)] 98.6 F (37 C) (04/13 0612) Pulse Rate:  [86-101] 97 (04/13 0612) Resp:  [15-19] 16 (04/13 0612) BP: (153-186)/(81-95) 186/95 (04/13 0612) SpO2:  [97 %-99 %] 98 % (04/13 0612)  Intake/Output from previous day: 04/12 0701 - 04/13 0700 In: -  Out: 3450 [Urine:3450] Intake/Output this shift: Total I/O In: 300 [P.O.:300] Out: 550 [Urine:550]  Recent Labs    02/19/21 0143 02/20/21 0344 02/21/21 0108  HGB 7.5* 7.8* 7.3*   Recent Labs    02/20/21 0344 02/21/21 0108  WBC 6.3 6.1  RBC 2.80* 2.60*  HCT 24.9* 23.2*  PLT 262 250   Recent Labs    02/20/21 0344 02/21/21 0108  NA 137 138  K 4.4 4.4  CL 105 107  CO2 23 26  BUN 28* 22*  CREATININE 2.01* 1.75*  GLUCOSE 172* 128*  CALCIUM 8.6* 8.4*   No results for input(s): LABPT, INR in the last 72 hours.  PE:  wn wd male in nad.  L LE splinted.  brisk cap refill at the toes.  by report the incisions a few days ago did not show signs of infection   Assessment/Plan:   s/p L tibiocalcaneal arthrodesis after charcot ankle and foot.    The patients clinical picture is puzzling.  Persistent fever and elevated sed rate are consistent with infection.  His symptoms started very quickly after surgery though which is unusual for postoperative infection.  Imaging of the ankle is interpreted as concerning for osteomyelitis.  However I would expect to see MRI changes after the very extensive surgery less than a week ago.  At this point I believe it is medically necessary to return to the operating room for irrigation and debridement of his wounds.  We  will plan to maintain all of the hardware and bone graft if at all possible.  We may consider antibiotic beads if either wound appears concerning.  He understands the risks and benefits of the alternative treatment options and elect surgical treatment.  He specifically understands risks of bleeding, infection, nerve damage, need for additional surgery, amputation and death.   Hold abx for intra op cultures tomorrow.  Pre op orders written.  D/c lovenox until POD 1.   Toni Arthurs 02/21/2021, 1:14 PM

## 2021-02-22 ENCOUNTER — Inpatient Hospital Stay (HOSPITAL_COMMUNITY): Payer: 59 | Admitting: Certified Registered"

## 2021-02-22 ENCOUNTER — Encounter (HOSPITAL_COMMUNITY): Payer: Self-pay | Admitting: Orthopedic Surgery

## 2021-02-22 ENCOUNTER — Encounter (HOSPITAL_COMMUNITY)
Admission: EM | Disposition: A | Discharge status: Home Health | Payer: Self-pay | Source: Home / Self Care | Attending: Internal Medicine

## 2021-02-22 DIAGNOSIS — R4182 Altered mental status, unspecified: Secondary | ICD-10-CM

## 2021-02-22 DIAGNOSIS — M14672 Charcot's joint, left ankle and foot: Secondary | ICD-10-CM | POA: Diagnosis not present

## 2021-02-22 DIAGNOSIS — N172 Acute kidney failure with medullary necrosis: Secondary | ICD-10-CM | POA: Diagnosis not present

## 2021-02-22 DIAGNOSIS — E669 Obesity, unspecified: Secondary | ICD-10-CM | POA: Diagnosis not present

## 2021-02-22 DIAGNOSIS — M869 Osteomyelitis, unspecified: Secondary | ICD-10-CM

## 2021-02-22 DIAGNOSIS — T847XXA Infection and inflammatory reaction due to other internal orthopedic prosthetic devices, implants and grafts, initial encounter: Secondary | ICD-10-CM

## 2021-02-22 DIAGNOSIS — A419 Sepsis, unspecified organism: Secondary | ICD-10-CM | POA: Diagnosis not present

## 2021-02-22 DIAGNOSIS — N179 Acute kidney failure, unspecified: Secondary | ICD-10-CM

## 2021-02-22 DIAGNOSIS — M86672 Other chronic osteomyelitis, left ankle and foot: Secondary | ICD-10-CM | POA: Diagnosis not present

## 2021-02-22 HISTORY — PX: I & D EXTREMITY: SHX5045

## 2021-02-22 LAB — COMPREHENSIVE METABOLIC PANEL
ALT: 14 U/L (ref 0–44)
AST: 21 U/L (ref 15–41)
Albumin: 2.1 g/dL — ABNORMAL LOW (ref 3.5–5.0)
Alkaline Phosphatase: 80 U/L (ref 38–126)
Anion gap: 5 (ref 5–15)
BUN: 17 mg/dL (ref 6–20)
CO2: 24 mmol/L (ref 22–32)
Calcium: 8 mg/dL — ABNORMAL LOW (ref 8.9–10.3)
Chloride: 105 mmol/L (ref 98–111)
Creatinine, Ser: 1.59 mg/dL — ABNORMAL HIGH (ref 0.61–1.24)
GFR, Estimated: 55 mL/min — ABNORMAL LOW (ref >60)
Glucose, Bld: 137 mg/dL — ABNORMAL HIGH (ref 70–99)
Potassium: 4.1 mmol/L (ref 3.5–5.1)
Sodium: 134 mmol/L — ABNORMAL LOW (ref 135–145)
Total Bilirubin: 0.6 mg/dL (ref 0.3–1.2)
Total Protein: 6.1 g/dL — ABNORMAL LOW (ref 6.5–8.1)

## 2021-02-22 LAB — CULTURE, BLOOD (ROUTINE X 2)
Culture: NO GROWTH
Culture: NO GROWTH
Special Requests: ADEQUATE
Special Requests: ADEQUATE

## 2021-02-22 LAB — GLUCOSE, CAPILLARY
Glucose-Capillary: 132 mg/dL — ABNORMAL HIGH (ref 70–99)
Glucose-Capillary: 141 mg/dL — ABNORMAL HIGH (ref 70–99)
Glucose-Capillary: 136 mg/dL — ABNORMAL HIGH (ref 70–99)
Glucose-Capillary: 121 mg/dL — ABNORMAL HIGH (ref 70–99)
Glucose-Capillary: 133 mg/dL — ABNORMAL HIGH (ref 70–99)
Glucose-Capillary: 137 mg/dL — ABNORMAL HIGH (ref 70–99)
Glucose-Capillary: 205 mg/dL — ABNORMAL HIGH (ref 70–99)

## 2021-02-22 LAB — CBC
HCT: 24.7 % — ABNORMAL LOW (ref 39.0–52.0)
Hemoglobin: 7.9 g/dL — ABNORMAL LOW (ref 13.0–17.0)
MCH: 28.6 pg (ref 26.0–34.0)
MCHC: 32 g/dL (ref 30.0–36.0)
MCV: 89.5 fL (ref 80.0–100.0)
Platelets: 288 10*3/uL (ref 150–400)
RBC: 2.76 MIL/uL — ABNORMAL LOW (ref 4.22–5.81)
RDW: 14.6 % (ref 11.5–15.5)
WBC: 6.3 10*3/uL (ref 4.0–10.5)
nRBC: 0 % (ref 0.0–0.2)

## 2021-02-22 SURGERY — IRRIGATION AND DEBRIDEMENT EXTREMITY
Anesthesia: General | Site: Ankle | Laterality: Left

## 2021-02-22 MED ORDER — TOBRAMYCIN SULFATE 1.2 G IJ SOLR
INTRAMUSCULAR | Status: AC
  Filled 2021-02-22: qty 1.2

## 2021-02-22 MED ORDER — ONDANSETRON HCL 4 MG/2ML IJ SOLN
4.0000 mg | Freq: Four times a day (QID) | INTRAMUSCULAR | Status: DC | PRN
  Filled 2021-02-22: qty 2

## 2021-02-22 MED ORDER — SODIUM CHLORIDE 0.9 % IR SOLN
Status: DC | PRN
  Administered 2021-02-22: 1000 mL

## 2021-02-22 MED ORDER — SODIUM CHLORIDE 0.9 % IV SOLN: INTRAVENOUS | Status: DC

## 2021-02-22 MED ORDER — ONDANSETRON HCL 4 MG PO TABS
4.0000 mg | ORAL_TABLET | Freq: Four times a day (QID) | ORAL | Status: DC | PRN
  Filled 2021-02-22: qty 1

## 2021-02-22 MED ORDER — PROPOFOL 10 MG/ML IV BOLUS
INTRAVENOUS | Status: DC | PRN
  Administered 2021-02-22: 200 mg via INTRAVENOUS

## 2021-02-22 MED ORDER — DEXAMETHASONE SODIUM PHOSPHATE 10 MG/ML IJ SOLN
INTRAMUSCULAR | Status: AC
  Filled 2021-02-22: qty 1

## 2021-02-22 MED ORDER — SENNA 8.6 MG PO TABS
1.0000 | ORAL_TABLET | Freq: Two times a day (BID) | ORAL | Status: DC
  Administered 2021-02-22 – 2021-02-27 (×8): 8.6 mg via ORAL
  Filled 2021-02-22 (×10): qty 1

## 2021-02-22 MED ORDER — SODIUM CHLORIDE 0.9 % IV SOLN
6.0000 mg/kg | Freq: Every day | INTRAVENOUS | Status: DC
  Administered 2021-02-22: 650 mg via INTRAVENOUS
  Filled 2021-02-22 (×2): qty 13

## 2021-02-22 MED ORDER — CHLORHEXIDINE GLUCONATE 0.12 % MT SOLN
OROMUCOSAL | Status: AC
  Filled 2021-02-22: qty 15

## 2021-02-22 MED ORDER — VANCOMYCIN HCL IN DEXTROSE 1-5 GM/200ML-% IV SOLN
INTRAVENOUS | Status: AC
  Administered 2021-02-22: 1000 mg via INTRAVENOUS
  Filled 2021-02-22: qty 400

## 2021-02-22 MED ORDER — ONDANSETRON HCL 4 MG/2ML IJ SOLN
INTRAMUSCULAR | Status: DC | PRN
  Administered 2021-02-22: 4 mg via INTRAVENOUS

## 2021-02-22 MED ORDER — INSULIN ASPART 100 UNIT/ML ~~LOC~~ SOLN
0.0000 [IU] | Freq: Three times a day (TID) | SUBCUTANEOUS | Status: DC
  Administered 2021-02-22: 2 [IU] via SUBCUTANEOUS
  Administered 2021-02-23: 3 [IU] via SUBCUTANEOUS

## 2021-02-22 MED ORDER — FENTANYL CITRATE (PF) 250 MCG/5ML IJ SOLN
INTRAMUSCULAR | Status: AC
  Filled 2021-02-22: qty 5

## 2021-02-22 MED ORDER — VANCOMYCIN HCL 1000 MG IV SOLR
INTRAVENOUS | Status: AC
  Filled 2021-02-22: qty 1000

## 2021-02-22 MED ORDER — ORAL CARE MOUTH RINSE: 15.0000 mL | Freq: Once | OROMUCOSAL | Status: AC

## 2021-02-22 MED ORDER — SODIUM CHLORIDE 0.9 % IV SOLN
2.0000 g | INTRAVENOUS | Status: DC
  Administered 2021-02-22 – 2021-02-27 (×6): 2 g via INTRAVENOUS
  Filled 2021-02-22: qty 2
  Filled 2021-02-22: qty 20
  Filled 2021-02-22 (×2): qty 2
  Filled 2021-02-22: qty 20
  Filled 2021-02-22: qty 2
  Filled 2021-02-22: qty 20

## 2021-02-22 MED ORDER — VANCOMYCIN HCL 1000 MG IV SOLR
INTRAVENOUS | Status: DC | PRN
  Administered 2021-02-22: 1000 mg via INTRAVENOUS

## 2021-02-22 MED ORDER — TOBRAMYCIN SULFATE 80 MG/2ML IJ SOLN
INTRAMUSCULAR | Status: AC
  Filled 2021-02-22: qty 2

## 2021-02-22 MED ORDER — ONDANSETRON HCL 4 MG/2ML IJ SOLN
INTRAMUSCULAR | Status: AC
  Filled 2021-02-22: qty 2

## 2021-02-22 MED ORDER — PROPOFOL 10 MG/ML IV BOLUS
INTRAVENOUS | Status: AC
  Filled 2021-02-22: qty 20

## 2021-02-22 MED ORDER — MIDAZOLAM HCL 2 MG/2ML IJ SOLN
INTRAMUSCULAR | Status: DC | PRN
  Administered 2021-02-22: 2 mg via INTRAVENOUS

## 2021-02-22 MED ORDER — CHLORHEXIDINE GLUCONATE 0.12 % MT SOLN
15.0000 mL | Freq: Once | OROMUCOSAL | Status: AC
  Administered 2021-02-22: 15 mL via OROMUCOSAL

## 2021-02-22 MED ORDER — TOBRAMYCIN SULFATE 1.2 G IJ SOLR
INTRAMUSCULAR | Status: DC | PRN
  Administered 2021-02-22: 1.2 g

## 2021-02-22 MED ORDER — LACTATED RINGERS IV SOLN: INTRAVENOUS | Status: DC

## 2021-02-22 MED ORDER — SODIUM CHLORIDE 0.9 % IR SOLN
Status: DC | PRN
  Administered 2021-02-22: 6000 mL

## 2021-02-22 MED ORDER — ACETAMINOPHEN 325 MG PO TABS
325.0000 mg | ORAL_TABLET | Freq: Four times a day (QID) | ORAL | Status: DC | PRN
  Administered 2021-02-23 – 2021-02-26 (×2): 650 mg via ORAL
  Filled 2021-02-22 (×3): qty 2

## 2021-02-22 MED ORDER — DOCUSATE SODIUM 100 MG PO CAPS
100.0000 mg | ORAL_CAPSULE | Freq: Two times a day (BID) | ORAL | Status: DC
  Administered 2021-02-22 – 2021-02-27 (×8): 100 mg via ORAL
  Filled 2021-02-22 (×10): qty 1

## 2021-02-22 MED ORDER — DEXAMETHASONE SODIUM PHOSPHATE 10 MG/ML IJ SOLN
INTRAMUSCULAR | Status: DC | PRN
  Administered 2021-02-22: 5 mg via INTRAVENOUS

## 2021-02-22 MED ORDER — FENTANYL CITRATE (PF) 250 MCG/5ML IJ SOLN
INTRAMUSCULAR | Status: DC | PRN
  Administered 2021-02-22 (×2): 50 ug via INTRAVENOUS

## 2021-02-22 MED ORDER — MIDAZOLAM HCL 2 MG/2ML IJ SOLN
INTRAMUSCULAR | Status: AC
  Filled 2021-02-22: qty 2

## 2021-02-22 MED ORDER — ENOXAPARIN SODIUM 30 MG/0.3ML ~~LOC~~ SOLN
30.0000 mg | SUBCUTANEOUS | Status: DC
  Administered 2021-02-23: 30 mg via SUBCUTANEOUS
  Filled 2021-02-22: qty 0.3

## 2021-02-22 MED ORDER — HYDROCODONE-ACETAMINOPHEN 5-325 MG PO TABS
1.0000 | ORAL_TABLET | ORAL | Status: DC | PRN
  Administered 2021-02-22: 1 via ORAL
  Administered 2021-02-23 – 2021-02-24 (×4): 2 via ORAL
  Administered 2021-02-25: 1 via ORAL
  Filled 2021-02-22: qty 2
  Filled 2021-02-22: qty 1
  Filled 2021-02-22 (×3): qty 2
  Filled 2021-02-22: qty 1

## 2021-02-22 MED ORDER — POVIDONE-IODINE 10 % EX SWAB: 2.0000 "application " | Freq: Once | CUTANEOUS | Status: DC

## 2021-02-22 MED ORDER — MORPHINE SULFATE (PF) 2 MG/ML IV SOLN
0.5000 mg | INTRAVENOUS | Status: DC | PRN
  Administered 2021-02-22 – 2021-02-26 (×7): 1 mg via INTRAVENOUS
  Filled 2021-02-22 (×8): qty 1

## 2021-02-22 MED ORDER — SODIUM CHLORIDE 0.9 % IV SOLN
2.0000 g | Freq: Once | INTRAVENOUS | Status: AC
  Administered 2021-02-22: 2 g via INTRAVENOUS
  Filled 2021-02-22: qty 2

## 2021-02-22 MED ORDER — VANCOMYCIN HCL 1000 MG IV SOLR
INTRAVENOUS | Status: DC | PRN
  Administered 2021-02-22: 1000 mg

## 2021-02-22 MED ORDER — MORPHINE SULFATE (PF) 2 MG/ML IV SOLN
2.0000 mg | Freq: Once | INTRAVENOUS | Status: AC
  Administered 2021-02-22: 2 mg via INTRAVENOUS
  Filled 2021-02-22: qty 1

## 2021-02-22 SURGICAL SUPPLY — 50 items
APL PRP STRL LF DISP 70% ISPRP (MISCELLANEOUS)
BANDAGE ESMARK 6X9 LF (GAUZE/BANDAGES/DRESSINGS) IMPLANT
BLADE SURG 10 STRL SS (BLADE) IMPLANT
BNDG CMPR 9X6 STRL LF SNTH (GAUZE/BANDAGES/DRESSINGS)
BNDG COHESIVE 4X5 TAN STRL (GAUZE/BANDAGES/DRESSINGS) ×2 IMPLANT
BNDG COHESIVE 6X5 TAN STRL LF (GAUZE/BANDAGES/DRESSINGS) ×2 IMPLANT
BNDG CONFORM 3 STRL LF (GAUZE/BANDAGES/DRESSINGS) IMPLANT
BNDG ESMARK 6X9 LF (GAUZE/BANDAGES/DRESSINGS)
CANISTER SUCT 3000ML PPV (MISCELLANEOUS) ×2 IMPLANT
CHLORAPREP W/TINT 26 (MISCELLANEOUS) IMPLANT
COVER SURGICAL LIGHT HANDLE (MISCELLANEOUS) ×2 IMPLANT
COVER WAND RF STERILE (DRAPES) IMPLANT
CUFF TOURN SGL QUICK 24 (TOURNIQUET CUFF) ×2
CUFF TOURN SGL QUICK 34 (TOURNIQUET CUFF)
CUFF TOURN SGL QUICK 42 (TOURNIQUET CUFF) IMPLANT
CUFF TRNQT CYL 24X4X16.5-23 (TOURNIQUET CUFF) ×1 IMPLANT
CUFF TRNQT CYL 34X4.125X (TOURNIQUET CUFF) IMPLANT
DRSG MEPITEL 4X7.2 (GAUZE/BANDAGES/DRESSINGS) ×2 IMPLANT
DRSG PAD ABDOMINAL 8X10 ST (GAUZE/BANDAGES/DRESSINGS) ×6 IMPLANT
ELECT REM PT RETURN 9FT ADLT (ELECTROSURGICAL) ×2
ELECTRODE REM PT RTRN 9FT ADLT (ELECTROSURGICAL) ×1 IMPLANT
EVACUATOR SILICONE 100CC (DRAIN) IMPLANT
GAUZE SPONGE 4X4 12PLY STRL (GAUZE/BANDAGES/DRESSINGS) ×4 IMPLANT
GLOVE BIO SURGEON STRL SZ8 (GLOVE) ×2 IMPLANT
GLOVE ECLIPSE 8.0 STRL XLNG CF (GLOVE) ×2 IMPLANT
GLOVE SRG 8 PF TXTR STRL LF DI (GLOVE) ×2 IMPLANT
GLOVE SURG UNDER POLY LF SZ8 (GLOVE) ×4
GOWN STRL REUS W/ TWL XL LVL3 (GOWN DISPOSABLE) ×2 IMPLANT
GOWN STRL REUS W/TWL XL LVL3 (GOWN DISPOSABLE) ×4
KIT BASIN OR (CUSTOM PROCEDURE TRAY) ×2 IMPLANT
KIT STIMULAN  10CC (Orthopedic Implant) ×2 IMPLANT
KIT STIMULAN 10CC (Orthopedic Implant) ×1 IMPLANT
KIT TURNOVER KIT B (KITS) ×2 IMPLANT
NS IRRIG 1000ML POUR BTL (IV SOLUTION) ×2 IMPLANT
PACK ORTHO EXTREMITY (CUSTOM PROCEDURE TRAY) ×2 IMPLANT
PAD ARMBOARD 7.5X6 YLW CONV (MISCELLANEOUS) ×4 IMPLANT
PAD CAST 4YDX4 CTTN HI CHSV (CAST SUPPLIES) ×1 IMPLANT
PADDING CAST COTTON 4X4 STRL (CAST SUPPLIES) ×2
SET CYSTO W/LG BORE CLAMP LF (SET/KITS/TRAYS/PACK) ×2 IMPLANT
SPONGE LAP 4X18 RFD (DISPOSABLE) IMPLANT
SUCTION FRAZIER HANDLE 10FR (MISCELLANEOUS) ×2
SUCTION TUBE FRAZIER 10FR DISP (MISCELLANEOUS) ×1 IMPLANT
SUT ETHILON 2 0 FS 18 (SUTURE) ×8 IMPLANT
SUT ETHILON 3 0 PS 1 (SUTURE) IMPLANT
SUT VIC AB 2-0 CT1 27 (SUTURE)
SUT VIC AB 2-0 CT1 TAPERPNT 27 (SUTURE) IMPLANT
TOWEL GREEN STERILE (TOWEL DISPOSABLE) ×2 IMPLANT
TOWEL GREEN STERILE FF (TOWEL DISPOSABLE) ×2 IMPLANT
TUBE CONNECTING 12X1/4 (SUCTIONS) ×2 IMPLANT
YANKAUER SUCT BULB TIP NO VENT (SUCTIONS) ×2 IMPLANT

## 2021-02-22 NOTE — Transfer of Care (Signed)
Immediate Anesthesia Transfer of Care Note  Patient: Kristine Tiley  Procedure(s) Performed: Irrigation and debridement left ankle (Left Ankle)  Patient Location: PACU  Anesthesia Type:General  Level of Consciousness: awake and drowsy  Airway & Oxygen Therapy: Patient Spontanous Breathing and Patient connected to face mask oxygen  Post-op Assessment: Report given to RN and Post -op Vital signs reviewed and stable  Post vital signs: Reviewed and stable  Last Vitals:  Vitals Value Taken Time  BP 131/76 02/22/21 1556  Temp    Pulse 75 02/22/21 1558  Resp 12 02/22/21 1558  SpO2 100 % 02/22/21 1558  Vitals shown include unvalidated device data.  Last Pain:  Vitals:   02/22/21 1405  TempSrc:   PainSc: 5       Patients Stated Pain Goal: 3 (02/22/21 1405)  Complications: No complications documented.

## 2021-02-22 NOTE — Interval H&P Note (Signed)
History and Physical Interval Note:  02/22/2021 2:18 PM  Dwayne Rocha  has presented today for surgery, with the diagnosis of left ankle infection.  The various methods of treatment have been discussed with the patient and family. After consideration of risks, benefits and other options for treatment, the patient has consented to  Procedure(s) with comments: Irrigation and debridement left ankle (Left) - as a surgical intervention.  The patient's history has been reviewed, patient examined, no change in status, stable for surgery.  I have reviewed the patient's chart and labs.  Questions were answered to the patient's satisfaction.    The risks and benefits of the alternative treatment options have been discussed in detail.  The patient wishes to proceed with surgery and specifically understands risks of bleeding, infection, nerve damage, blood clots, need for additional surgery, amputation and death.    Toni Arthurs
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
## 2021-02-22 NOTE — Anesthesia Procedure Notes (Signed)
Procedure Name: LMA Insertion Date/Time: 02/22/2021 2:40 PM Performed by: De Nurse, CRNA Pre-anesthesia Checklist: Patient identified, Emergency Drugs available, Suction available and Patient being monitored Patient Re-evaluated:Patient Re-evaluated prior to induction Oxygen Delivery Method: Circle System Utilized Preoxygenation: Pre-oxygenation with 100% oxygen Induction Type: IV induction Ventilation: Mask ventilation without difficulty LMA: LMA inserted LMA Size: 5.0 Number of attempts: 1 Placement Confirmation: positive ETCO2 Tube secured with: Tape Dental Injury: Teeth and Oropharynx as per pre-operative assessment
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
## 2021-02-22 NOTE — Op Note (Addendum)
02/22/2021  3:51 PM  PATIENT:  Dwayne Rocha  45 y.o. male  PRE-OPERATIVE DIAGNOSIS: Left ankle infection s/p tibio-calcaneal arthrodesis  POST-OPERATIVE DIAGNOSIS: same  Procedure(s): Irrigation and excisional debridement of left medial and lateral ankle wounds  SURGEON:  Toni Arthurs, MD  ASSISTANT: None  ANESTHESIA:   General  EBL:  minimal   TOURNIQUET: Approximately 40 minutes at 250 mmHg calf tourniquet  COMPLICATIONS:  None apparent  DISPOSITION:  Extubated, awake and stable to recovery.  Debridement type: Excisional Debridement  Side: left  Body Location: left ankle   Tools used for debridement: curette and rongeur  Pre-debridement Wound size (cm):   Length: 11        Width: 3     Depth: 4   Post-debridement Wound size (cm):   Length: 5        Width: 3     Depth: 3   Debridement depth beyond dead/damaged tissue down to healthy viable tissue: yes  Tissue layer involved: skin, subcutaneous tissue, muscle / fascia, bone  Nature of tissue removed: Non-viable tissue  Irrigation volume: 6L     Irrigation fluid type: Normal Saline   INDICATION FOR PROCEDURE: The patient is a 45 year old male with a past medical history significant for diabetes 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  A week ago he underwent open treatment of ankle and subtalar joint dislocations that resulted from Charcot neuroarthropathy.  He underwent tibio calcaneal arthrodesis and was discharged home on postop day 1.  There were no apparent complications at the time of his surgery or during his initial hospital course.  He presented to the emergency room on postop day 2 via EMS.  He was admitted.  Hospitalist team and infectious disease believe infection is the likely cause of his admission symptoms.  Extensive work-up revealed no other source of infection.  He presents now for irrigation and debridement of his left ankle wounds.  He is not on any  antibiotics.  He understands the risks and benefits of the alternative treatment options and elect surgical treatment.  He specifically understands risks of bleeding, infection, nerve damage, blood clots, need for additional surgery, amputation and death.  PROCEDURE IN DETAIL: After preoperative consent was obtained and the correct operative site was identified, the patient was brought the operating room and placed upon the operating table.  General anesthesia was induced.  Preoperative antibiotics were held pending intraoperative cultures.  A surgical timeout was taken.  The left lower extremity was prepped and draped in standard sterile fashion with a tourniquet around the calf.  The extremity was elevated and the calf tourniquet was inflated to 250 mmHg.  The patient's previous incisions were noted medially, laterally and plantarly.  All sutures were removed.  The medial wound was noted to have hematoma but no gross purulence.  The lateral wound was also noted to have hematoma but no gross purulence.  The medial wound was then debrided circumferentially from the level of the skin down through the subcutaneous tissues down to the arthrodesis site.  Curette and rondure were used to remove all nonviable tissue.  The lateral wound was then debrided in the same fashion.  Deep tissue from the medial and lateral wounds were sent to microbiology for fungal, aerobic and anaerobic culture all of the percutaneous incisions were debrided with a curette.  The medial and lateral wounds were then irrigated with a total of 6 L of normal saline.  The wounds were all closed with  horizontal mattress sutures of 2-0 nylon.  The medial wound was utilized to insert 10 cc of stimulant beads with vancomycin and tobramycin.  The medial wound was then closed with 2-0 nylon horizontal mattress sutures.  Sterile dressings were applied followed by a well-padded short leg splint.  The tourniquet was released after application of the  dressings.  The patient was awakened from anesthesia and transported to the recovery room in stable condition.  IV cefepime and vancomycin were administered after obtaining cultures.  FOLLOW UP PLAN: The patient will be returned to 6 N. under the care of the hospitalist service.  He will start on vancomycin and cefepime pending culture results.  Nonweightbearing on the left lower extremity.  Lovenox for DVT prophylaxis on postop day 1.

## 2021-02-22 NOTE — Anesthesia Preprocedure Evaluation (Signed)
Anesthesia Evaluation  Patient identified by MRN, date of birth, ID band Patient awake    Reviewed: Allergy & Precautions, NPO status , Patient's Chart, lab work & pertinent test results  History of Anesthesia Complications Negative for: history of anesthetic complications  Airway Mallampati: II  TM Distance: >3 FB Neck ROM: Full    Dental  (+) Teeth Intact   Pulmonary neg pulmonary ROS, former smoker,    Pulmonary exam normal        Cardiovascular hypertension, Pt. on medications Normal cardiovascular exam     Neuro/Psych Anxiety Depression negative neurological ROS     GI/Hepatic Neg liver ROS, GERD  ,  Endo/Other  diabetes, Type 2, Insulin Dependent, Oral Hypoglycemic AgentsMorbid obesity  Renal/GU ARF and CRFRenal disease (Cr 1.59)  negative genitourinary   Musculoskeletal   Abdominal   Peds  Hematology  (+) anemia ,   Anesthesia Other Findings  Readmitted to hospital after recent ankle surgery with severe sepsis and concern for osteomyelitis. Presented with AKI on CKD, hypoxemia, rhabdomyolysis. Requires I&D of ankle.  Exercise Myoview stress test 04/03/2020: Exercise nuclear stress test was performed using Bruce protocol. Patient  reached 4.6 METS, and 89% of age predicted maximum heart rate. Exercise  capacity was low. Chest pain not reported. Heart rate and hemodynamic  response were normal. Stress EKG revealed no ischemic changes. SPECT images show mild decrease in basal inferior tracer uptake likely due  to diaphragmatic attenuation. Stress LVEF 74%. Low risk study.   Reproductive/Obstetrics                             Anesthesia Physical  Anesthesia Plan  ASA: IV  Anesthesia Plan: General   Post-op Pain Management:    Induction: Intravenous  PONV Risk Score and Plan: 2 and Ondansetron, Dexamethasone, Midazolam and Treatment may vary due to age or medical  condition  Airway Management Planned: LMA and Oral ETT  Additional Equipment: None  Intra-op Plan:   Post-operative Plan: Extubation in OR  Informed Consent: I have reviewed the patients History and Physical, chart, labs and discussed the procedure including the risks, benefits and alternatives for the proposed anesthesia with the patient or authorized representative who has indicated his/her understanding and acceptance.     Dental advisory given  Plan Discussed with:   Anesthesia Plan Comments:         Anesthesia Quick Evaluation

## 2021-02-22 NOTE — Progress Notes (Signed)
Inpatient Rehab Admissions Coordinator Note:   Per updated PT recommendations, pt was screened for CIR candidacy by Estill Dooms, PT, DPT.  At this time would not be able to get CIR auth with SNF recs from OT.  Also note pt plans for OR today, so will follow for post-op recommendations.  Please contact me with questions.   Estill Dooms, PT, DPT [phone] 02/22/21 9:44 AM

## 2021-02-22 NOTE — Plan of Care (Signed)
  Problem: Fluid Volume: Goal: Hemodynamic stability will improve Outcome: Progressing   Problem: Clinical Measurements: Goal: Signs and symptoms of infection will decrease Outcome: Progressing   Problem: Respiratory: Goal: Ability to maintain adequate ventilation will improve Outcome: Progressing   Problem: Education: Goal: Knowledge of General Education information will improve Description: Including pain rating scale, medication(s)/side effects and non-pharmacologic comfort measures Outcome: Progressing   Problem: Clinical Measurements: Goal: Ability to maintain clinical measurements within normal limits will improve Outcome: Progressing   Problem: Activity: Goal: Risk for activity intolerance will decrease Outcome: Progressing   Problem: Elimination: Goal: Will not experience complications related to bowel motility Outcome: Progressing Goal: Will not experience complications related to urinary retention Outcome: Progressing   Problem: Pain Managment: Goal: General experience of comfort will improve Outcome: Progressing   Problem: Safety: Goal: Ability to remain free from injury will improve Outcome: Progressing   Problem: Skin Integrity: Goal: Risk for impaired skin integrity will decrease Outcome: Progressing   Problem: Education: Goal: Ability to describe self-care measures that may prevent or decrease complications (Diabetes Survival Skills Education) will improve Outcome: Progressing Goal: Individualized Educational Video(s) Outcome: Progressing   Problem: Coping: Goal: Ability to adjust to condition or change in health will improve Outcome: Progressing   Problem: Fluid Volume: Goal: Ability to maintain a balanced intake and output will improve Outcome: Progressing   Problem: Health Behavior/Discharge Planning: Goal: Ability to identify and utilize available resources and services will improve Outcome: Progressing Goal: Ability to manage  health-related needs will improve Outcome: Progressing   Problem: Metabolic: Goal: Ability to maintain appropriate glucose levels will improve Outcome: Progressing   Problem: Nutritional: Goal: Maintenance of adequate nutrition will improve Outcome: Progressing Goal: Progress toward achieving an optimal weight will improve Outcome: Progressing   Problem: Skin Integrity: Goal: Risk for impaired skin integrity will decrease Outcome: Progressing   Problem: Tissue Perfusion: Goal: Adequacy of tissue perfusion will improve Outcome: Progressing

## 2021-02-22 NOTE — Anesthesia Postprocedure Evaluation (Signed)
Anesthesia Post Note  Patient: Dwayne Rocha  Procedure(s) Performed: Irrigation and debridement left ankle (Left Ankle)     Patient location during evaluation: PACU Anesthesia Type: General Level of consciousness: awake and alert Pain management: pain level controlled Vital Signs Assessment: post-procedure vital signs reviewed and stable Respiratory status: spontaneous breathing, nonlabored ventilation and respiratory function stable Cardiovascular status: blood pressure returned to baseline and stable Postop Assessment: no apparent nausea or vomiting Anesthetic complications: no   No complications documented.  Last Vitals:  Vitals:   02/22/21 1626 02/22/21 1652  BP: (!) 164/87 (!) 159/91  Pulse:  78  Resp: 12 16  Temp: 36.6 C 36.8 C  SpO2: 97% 98%    Last Pain:  Vitals:   02/22/21 1652  TempSrc: Oral  PainSc:                  Lucretia Kern

## 2021-02-22 NOTE — Progress Notes (Addendum)
Pharmacy Antibiotic Note  Dwayne Rocha is a 45 y.o. male admitted on 02/17/2021 with sepsis possibly related to osteomyelitis of L ankle.  Pt is S/P I&D of L medial and lateral ankle wounds today. Pharmacy has been consulted for daptomycin dosing for osteomyelitis. ID is following and planning for 6 wks of antibiotics.  Antibiotics were held until after surgical cx obtained, then pt was ordered vancomycin 1 gm X 1 at  this afternoon, but pt only rec'd ~250 mg of the dose per RN) and cefepime 2 gm IV X 1 administered at 1522 this afternoon.  WBC 6.3, afebrile; Scr 1.59, CrCl 95.2 ml/min; BL CK 478 (pt with rhabdomyolysis, most likely with recent ortho surgery, per primary team note; CK improved since admission; pt cont with IV fluid and holding home statin)  Plan: Daptomycin 6 mg/kg/day (using adjusted body wt of 112.3 kg, dose is 650 mg IV Q 24 hrs) Monitor WBC (including eosinophils), temp, clinical improvement, cultures, renal function, CK (~2 times weekly until WNL) F/U ID antibiotic plan  Height: 5\' 11"  (180.3 cm) Weight: (!) 167.8 kg (370 lb) IBW/kg (Calculated) : 75.3  Temp (24hrs), Avg:98 F (36.7 C), Min:97.8 F (36.6 C), Max:98.3 F (36.8 C)  Recent Labs  Lab 02/17/21 1540 02/17/21 1805 02/18/21 0053 02/19/21 0143 02/20/21 0344 02/21/21 0108 02/22/21 0312  WBC  --   --  8.4 7.5 6.3 6.1 6.3  CREATININE  --   --  5.44* 3.33* 2.01* 1.75* 1.59*  LATICACIDVEN 0.9 0.7  --   --   --   --   --     Estimated Creatinine Clearance: 95.2 mL/min (A) (by C-G formula based on SCr of 1.59 mg/dL (H)).    Allergies  Allergen Reactions  . Lyrica [Pregabalin] Swelling    Antimicrobials this admission: 4/14 cefepime X 1 4/14 vancomycin X 1 4/14 ceftriaxone >> 4/14 daptomycin >>  Microbiology results: 4/14 Surgical cx: pending 4/9 BCx X 2: NG/final 4/9 UCx: NG/final 4/7 MRSA PCR: negative 4/9 COVID, flu A, flu B: negative 4/10 HIV screen: negative  Thank you for allowing  pharmacy to be a part of this patient's care.  6/9, PharmD, BCPS, [facility] Clinical Pharmacist 02/22/2021 4:24 PM

## 2021-02-22 NOTE — Progress Notes (Signed)
[facility] for Infectious Disease  Date of Admission:  02/17/2021     Total days of antibiotics 3         ASSESSMENT:  Mr. Cogburn is awaiting I&D with Dr. Doran Durand today with concern for osteomyelitis in the setting of Charcot foot and s/p surgery. Objective data with elevated ESR and CRP in addition to imaging remain concerning for infection. Continue to hold antibiotics pending I&D today to optimize culture growth if present. Recommend broad spectrum coverage following surgery with narrowing of antibiotics as able pending culture results. Will check affordability of daptomycin.   PLAN:  1. Continue to hold antibiotics pending I&D.  2. Broad spectrum antibiotics post surgery. Will determine affordability of daptomycin.   Principal Problem:   Sepsis due to undetermined organism [facility]) Active Problems:   Essential hypertension   Uncontrolled type 2 diabetes mellitus with hyperglycemia (HCC)   Charcot's joint of ankle, left   Sleep apnea   Class 3 obesity   Acute renal failure superimposed on stage 3a chronic kidney disease (HCC)   Normocytic anemia   Hyponatremia   Hypoalbuminemia   Severe protein-calorie malnutrition (HCC)   Elevated CK   . amLODipine  10 mg Oral Daily  . Chlorhexidine Gluconate Cloth  6 each Topical Daily  . cyanocobalamin  1,000 mcg Intramuscular Daily  . docusate sodium  100 mg Oral BID  . insulin aspart  0-20 Units Subcutaneous TID WC  . insulin aspart  0-5 Units Subcutaneous QHS  . insulin glargine  10 Units Subcutaneous QHS  . lisinopril  40 mg Oral Daily  . metoprolol tartrate  12.5 mg Oral BID  . pantoprazole  40 mg Oral BID  . senna  2 tablet Oral BID  . simvastatin  10 mg Oral q1800    SUBJECTIVE:  Afebrile overnight with no acute events. No new concerns/complaints.   Allergies  Allergen Reactions  . Lyrica [Pregabalin] Swelling     Review of Systems: Review of Systems  Constitutional: Negative for chills, fever and weight  loss.  Respiratory: Negative for cough, shortness of breath and wheezing.   Cardiovascular: Negative for chest pain and leg swelling.  Gastrointestinal: Negative for abdominal pain, constipation, diarrhea, nausea and vomiting.  Skin: Negative for rash.      OBJECTIVE: Vitals:   02/21/21 0612 02/21/21 1348 02/21/21 2017 02/22/21 0528  BP: (!) 186/95 (!) 147/80 (!) 123/56 (!) 156/86  Pulse: 97 91 89 88  Resp: _0 Temp: 98.6 F (37 C) 98.6 F (37 C) 98.3 F (36.8 C) 98 F (36.7 C)  TempSrc: Oral Oral Oral   SpO2: 98% 100% 99% 100%  Weight:      Height:       Body mass index is 56.27 kg/m.  Physical Exam Constitutional:      General: He is not in acute distress.    Appearance: He is well-developed. He is obese.  Cardiovascular:     Rate and Rhythm: Normal rate and regular rhythm.     Heart sounds: Normal heart sounds.  Pulmonary:     Effort: Pulmonary effort is normal.     Breath sounds: Normal breath sounds.  Skin:    General: Skin is warm and dry.  Neurological:     Mental Status: He is alert and oriented to person, place, and time.  Psychiatric:        Mood and Affect: Mood normal.     Lab Results Lab Results  Component Value  Date   WBC 6.3 02/22/2021   HGB 7.9 (L) 02/22/2021   HCT 24.7 (L) 02/22/2021   MCV 89.5 02/22/2021   PLT 288 02/22/2021    Lab Results  Component Value Date   CREATININE 1.59 (H) 02/22/2021   BUN 17 02/22/2021   NA 134 (L) 02/22/2021   K 4.1 02/22/2021   CL 105 02/22/2021   CO2 24 02/22/2021    Lab Results  Component Value Date   ALT 14 02/22/2021   AST 21 02/22/2021   ALKPHOS 80 02/22/2021   BILITOT 0.6 02/22/2021     Microbiology: Recent Results (from the past 240 hour(s))  Surgical pcr screen     Status: None   Collection Time: 02/15/21  5:41 AM   Specimen: Nasal Mucosa; Nasal Swab  Result Value Ref Range Status   MRSA, PCR NEGATIVE NEGATIVE Final   Staphylococcus aureus NEGATIVE NEGATIVE Final     Comment: (NOTE) The Xpert SA Assay (FDA approved for NASAL specimens in patients 68 years of age and older), is one component of a comprehensive surveillance program. It is not intended to diagnose infection nor to guide or monitor treatment. Performed at [facility], [place] [address] [place], [place] [address]   Blood culture (routine x 2)     Status: None   Collection Time: 02/17/21  3:36 PM   Specimen: BLOOD RIGHT FOREARM  Result Value Ref Range Status   Specimen Description BLOOD RIGHT FOREARM  Final   Special Requests   Final    BOTTLES DRAWN AEROBIC AND ANAEROBIC Blood Culture adequate volume   Culture   Final    NO GROWTH 5 DAYS Performed at [facility], [place] [address], [place], [place] [address]    Report Status 02/22/2021 FINAL  Final  Resp Panel by RT-PCR (Flu A&B, Covid) Nasopharyngeal Swab     Status: None   Collection Time: 02/17/21  3:41 PM   Specimen: Nasopharyngeal Swab; Nasopharyngeal(NP) swabs in vial transport medium  Result Value Ref Range Status   SARS Coronavirus 2 by RT PCR NEGATIVE NEGATIVE Final    Comment: (NOTE) SARS-CoV-2 target nucleic acids are NOT DETECTED.  The SARS-CoV-2 RNA is generally detectable in upper respiratory specimens during the acute phase of infection. The lowest concentration of SARS-CoV-2 viral copies this assay can detect is 138 copies/mL. A negative result does not preclude SARS-Cov-2 infection and should not be used as the sole basis for treatment or other patient management decisions. A negative result may occur with  improper specimen collection/handling, submission of specimen other than nasopharyngeal swab, presence of viral mutation(s) within the areas targeted by this assay, and inadequate number of viral copies(<138 copies/mL). A negative result must be combined with clinical observations, patient history, and epidemiological information. The expected result is Negative.  Fact Sheet for Patients:   EntrepreneurPulse.com.au  Fact Sheet for Healthcare Providers:  IncredibleEmployment.be  This test is no t yet approved or cleared by the [place] FDA and  has been authorized for detection and/or diagnosis of SARS-CoV-2 by FDA under an Emergency Use Authorization (EUA). This EUA will remain  in effect (meaning this test can be used) for the duration of the COVID-19 declaration under Section 564(b)(1) of the Act, 21 U.S.C.section 360bbb-3(b)(1), unless the authorization is terminated  or revoked sooner.       Influenza A by PCR NEGATIVE NEGATIVE Final   Influenza B by PCR NEGATIVE NEGATIVE Final    Comment: (NOTE) The Xpert Xpress SARS-CoV-2/FLU/RSV  plus assay is intended as an aid in the diagnosis of influenza from Nasopharyngeal swab specimens and should not be used as a sole basis for treatment. Nasal washings and aspirates are unacceptable for Xpert Xpress SARS-CoV-2/FLU/RSV testing.  Fact Sheet for Patients: EntrepreneurPulse.com.au  Fact Sheet for Healthcare Providers: IncredibleEmployment.be  This test is not yet approved or cleared by the [place] FDA and has been authorized for detection and/or diagnosis of SARS-CoV-2 by FDA under an Emergency Use Authorization (EUA). This EUA will remain in effect (meaning this test can be used) for the duration of the COVID-19 declaration under Section 564(b)(1) of the Act, 21 U.S.C. section 360bbb-3(b)(1), unless the authorization is terminated or revoked.  Performed at [facility], [place] [address] [place], [place] [address]   Blood culture (routine x 2)     Status: None   Collection Time: 02/17/21  3:50 PM   Specimen: BLOOD RIGHT HAND  Result Value Ref Range Status   Specimen Description BLOOD RIGHT HAND  Final   Special Requests   Final    BOTTLES DRAWN AEROBIC AND ANAEROBIC Blood Culture adequate volume   Culture   Final    NO  GROWTH 5 DAYS Performed at [facility], [place] [address], [place], [place] [address]    Report Status 02/22/2021 FINAL  Final  Urine culture     Status: None   Collection Time: 02/17/21  6:35 PM   Specimen: In/Out Cath Urine  Result Value Ref Range Status   Specimen Description IN/OUT CATH URINE  Final   Special Requests NONE  Final   Culture   Final    NO GROWTH Performed at [facility], [place] [address], [place], [place] [address]    Report Status 02/19/2021 FINAL  Final     Terri Piedra, NP [facility]  02/22/2021  12:09 PM

## 2021-02-23 ENCOUNTER — Encounter (HOSPITAL_COMMUNITY): Payer: Self-pay | Admitting: Orthopedic Surgery

## 2021-02-23 DIAGNOSIS — N179 Acute kidney failure, unspecified: Secondary | ICD-10-CM | POA: Diagnosis not present

## 2021-02-23 DIAGNOSIS — G4739 Other sleep apnea: Secondary | ICD-10-CM

## 2021-02-23 DIAGNOSIS — I10 Essential (primary) hypertension: Secondary | ICD-10-CM

## 2021-02-23 DIAGNOSIS — T847XXD Infection and inflammatory reaction due to other internal orthopedic prosthetic devices, implants and grafts, subsequent encounter: Secondary | ICD-10-CM

## 2021-02-23 DIAGNOSIS — E669 Obesity, unspecified: Secondary | ICD-10-CM | POA: Diagnosis not present

## 2021-02-23 DIAGNOSIS — M14672 Charcot's joint, left ankle and foot: Secondary | ICD-10-CM | POA: Diagnosis not present

## 2021-02-23 DIAGNOSIS — N172 Acute kidney failure with medullary necrosis: Secondary | ICD-10-CM | POA: Diagnosis not present

## 2021-02-23 DIAGNOSIS — A419 Sepsis, unspecified organism: Secondary | ICD-10-CM | POA: Diagnosis not present

## 2021-02-23 LAB — CBC
HCT: 25.3 % — ABNORMAL LOW (ref 39.0–52.0)
Hemoglobin: 8 g/dL — ABNORMAL LOW (ref 13.0–17.0)
MCH: 28.1 pg (ref 26.0–34.0)
MCHC: 31.6 g/dL (ref 30.0–36.0)
MCV: 88.8 fL (ref 80.0–100.0)
Platelets: 315 10*3/uL (ref 150–400)
RBC: 2.85 MIL/uL — ABNORMAL LOW (ref 4.22–5.81)
RDW: 14.5 % (ref 11.5–15.5)
WBC: 7.6 10*3/uL (ref 4.0–10.5)
nRBC: 0 % (ref 0.0–0.2)

## 2021-02-23 LAB — BASIC METABOLIC PANEL
Anion gap: 5 (ref 5–15)
BUN: 17 mg/dL (ref 6–20)
CO2: 23 mmol/L (ref 22–32)
Calcium: 7.9 mg/dL — ABNORMAL LOW (ref 8.9–10.3)
Chloride: 106 mmol/L (ref 98–111)
Creatinine, Ser: 1.6 mg/dL — ABNORMAL HIGH (ref 0.61–1.24)
GFR, Estimated: 54 mL/min — ABNORMAL LOW (ref >60)
Glucose, Bld: 191 mg/dL — ABNORMAL HIGH (ref 70–99)
Potassium: 5.1 mmol/L (ref 3.5–5.1)
Sodium: 134 mmol/L — ABNORMAL LOW (ref 135–145)

## 2021-02-23 LAB — GLUCOSE, CAPILLARY
Glucose-Capillary: 174 mg/dL — ABNORMAL HIGH (ref 70–99)
Glucose-Capillary: 177 mg/dL — ABNORMAL HIGH (ref 70–99)
Glucose-Capillary: 154 mg/dL — ABNORMAL HIGH (ref 70–99)
Glucose-Capillary: 123 mg/dL — ABNORMAL HIGH (ref 70–99)

## 2021-02-23 LAB — CK: Total CK: 184 U/L (ref 49–397)

## 2021-02-23 MED ORDER — INSULIN ASPART 100 UNIT/ML ~~LOC~~ SOLN
0.0000 [IU] | Freq: Three times a day (TID) | SUBCUTANEOUS | Status: DC
  Administered 2021-02-23 (×2): 4 [IU] via SUBCUTANEOUS
  Administered 2021-02-24 – 2021-02-25 (×5): 3 [IU] via SUBCUTANEOUS
  Administered 2021-02-26: 4 [IU] via SUBCUTANEOUS
  Administered 2021-02-26 – 2021-02-27 (×2): 3 [IU] via SUBCUTANEOUS

## 2021-02-23 MED ORDER — METOPROLOL TARTRATE 12.5 MG HALF TABLET
12.5000 mg | ORAL_TABLET | Freq: Two times a day (BID) | ORAL | Status: DC
  Administered 2021-02-23 – 2021-02-24 (×3): 12.5 mg via ORAL
  Filled 2021-02-23 (×3): qty 1

## 2021-02-23 MED ORDER — SIMVASTATIN 20 MG PO TABS: 10.0000 mg | ORAL_TABLET | Freq: Every day | ORAL | Status: DC

## 2021-02-23 MED ORDER — SODIUM CHLORIDE 0.9 % IV SOLN
8.0000 mg/kg | Freq: Every day | INTRAVENOUS | Status: DC
  Administered 2021-02-23 – 2021-02-27 (×5): 900 mg via INTRAVENOUS
  Filled 2021-02-23 (×5): qty 18

## 2021-02-23 MED ORDER — PANTOPRAZOLE SODIUM 40 MG PO TBEC
40.0000 mg | DELAYED_RELEASE_TABLET | Freq: Two times a day (BID) | ORAL | Status: DC
  Administered 2021-02-23 – 2021-02-27 (×9): 40 mg via ORAL
  Filled 2021-02-23 (×9): qty 1

## 2021-02-23 MED ORDER — INSULIN GLARGINE 100 UNIT/ML ~~LOC~~ SOLN
10.0000 [IU] | Freq: Every day | SUBCUTANEOUS | Status: DC
  Administered 2021-02-23 – 2021-02-26 (×4): 10 [IU] via SUBCUTANEOUS
  Filled 2021-02-23 (×5): qty 0.1

## 2021-02-23 MED ORDER — INSULIN ASPART 100 UNIT/ML ~~LOC~~ SOLN: 0.0000 [IU] | Freq: Every day | SUBCUTANEOUS | Status: DC

## 2021-02-23 MED ORDER — VITAMIN B-12 1000 MCG PO TABS
1000.0000 ug | ORAL_TABLET | Freq: Every day | ORAL | Status: DC
  Administered 2021-02-23 – 2021-02-27 (×5): 1000 ug via ORAL
  Filled 2021-02-23 (×5): qty 1

## 2021-02-23 MED ORDER — AMLODIPINE BESYLATE 10 MG PO TABS
10.0000 mg | ORAL_TABLET | Freq: Every day | ORAL | Status: DC
  Administered 2021-02-23 – 2021-02-27 (×5): 10 mg via ORAL
  Filled 2021-02-23 (×5): qty 1

## 2021-02-23 NOTE — Discharge Instructions (Signed)
Toni Arthurs, MD [facility]  Please read the following information regarding your care after surgery.  Medications  You only need a prescription for the narcotic pain medicine (ex. oxycodone, Percocet, Norco).  All of the other medicines listed below are available over the counter. X Aleve 2 pills twice a day for the first 3 days after surgery. X acetominophen (Tylenol) 650 mg every 4-6 hours as you need for minor to moderate pain X oxycodone as previously prescribed for severe pain  Narcotic pain medicine (ex. oxycodone, Percocet, Vicodin) will cause constipation.  To prevent this problem, take the following medicines while you are taking any pain medicine. X docusate sodium (Colace) 100 mg twice a day X senna (Senokot) 2 tablets twice a day  X To help prevent blood clots, take Xarelto for two weeks after surgery.  You should also get up every hour while you are awake to move around.    Weight Bearing X Do not bear any weight on the operated leg or foot.  Cast / Splint / Dressing X Keep your splint, cast or dressing clean and dry.  Don't put anything (coat hanger, pencil, etc) down inside of it.  If it gets damp, use a hair dryer on the cool setting to dry it.  If it gets soaked, call the office to schedule an appointment for a cast change.    After your dressing, cast or splint is removed; you may shower, but do not soak or scrub the wound.  Allow the water to run over it, and then gently pat it dry.  Swelling It is normal for you to have swelling where you had surgery.  To reduce swelling and pain, keep your toes above your nose for at least 3 days after surgery.  It may be necessary to keep your foot or leg elevated for several weeks.  If it hurts, it should be elevated.  Follow Up Call my office at [phone] when you are discharged from the hospital or surgery center to schedule an appointment to be seen two weeks after surgery.  Call my office at [phone] if you develop a  fever >101.5 F, nausea, vomiting, bleeding from the surgical site or severe pain.

## 2021-02-23 NOTE — Progress Notes (Signed)
Pharmacy Antibiotic Note  Dwayne Rocha is a 45 y.o. male admitted on 02/17/2021 with sepsis possibly related to osteomyelitis of L ankle.  Pt is S/P I&D of L medial and lateral ankle wounds 4/14. Pharmacy has been consulted for daptomycin dosing for osteomyelitis.Planning 6 weeks of antibiotics.   WBC WNL, afebrile; Scr 1.60 and CrCl ~ 90 ml/min. Noted CK is trending down since admission and is now 184.   Plan: Increase Daptomycin to 8  mg/kg/day (using adjusted body wt of 112.3 kg, dose is 900 mg IV Q 24 hrs) Will plan for OPAT   Height: 5\' 11"  (180.3 cm) Weight: (!) 167.8 kg (370 lb) IBW/kg (Calculated) : 75.3  Temp (24hrs), Avg:98.1 F (36.7 C), Min:97.8 F (36.6 C), Max:98.3 F (36.8 C)  Recent Labs  Lab 02/17/21 1540 02/17/21 1805 02/18/21 0053 02/19/21 0143 02/20/21 0344 02/21/21 0108 02/22/21 0312 02/23/21 0038  WBC  --   --    < > 7.5 6.3 6.1 6.3 7.6  CREATININE  --   --    < > 3.33* 2.01* 1.75* 1.59* 1.60*  LATICACIDVEN 0.9 0.7  --   --   --   --   --   --    < > = values in this interval not displayed.    Estimated Creatinine Clearance: 94.6 mL/min (A) (by C-G formula based on SCr of 1.6 mg/dL (H)).    Allergies  Allergen Reactions  . Lyrica [Pregabalin] Swelling    Antimicrobials this admission: 4/14 cefepime X 1 4/14 vancomycin X 1 4/14 ceftriaxone >> 4/14 daptomycin >>  Microbiology results: 4/14 Surgical cx: pending 4/9 BCx X 2: NG/final 4/9 UCx: NG/final 4/7 MRSA PCR: negative 4/9 COVID, flu A, flu B: negative 4/10 HIV screen: negative  Thank you for allowing pharmacy to be a part of this patient's care.  6/9, PharmD, BCPS, BCIDP Infectious Diseases Clinical Pharmacist Phone: [phone] 02/23/2021 8:37 AM

## 2021-02-23 NOTE — Progress Notes (Signed)
PHARMACY CONSULT NOTE FOR:  OUTPATIENT  PARENTERAL ANTIBIOTIC THERAPY (OPAT)  Indication: Osteomyelitis of the ankle Regimen: Ceftriaxone 2gm IV q24, Daptomycin 900mg  IV q24h   End date: 04/04/21   IV antibiotic discharge orders are pended. To discharging provider:  please sign these orders via discharge navigator,  Select New Orders & click on the button choice - Manage This Unsigned Work.     Reighlyn Elmes A. 04/06/21, PharmD, BCPS, FNKF Clinical Pharmacist [place] Please utilize Amion for appropriate phone number to reach the unit pharmacist [facility])   02/23/2021, 6:50 PM

## 2021-02-23 NOTE — Progress Notes (Signed)
[facility]  Date of Admission:  02/17/2021     Total days of antibiotics 1         ASSESSMENT:  Dwayne Rocha is POD #1 s/p I&D left ankle with concern for osteomyelitis and surgical specimen with no growth on gram stain and culture without growth in <24 hours. Started on broad spectrum coverage with daptomycin and ceftriaxone. Will monitor cultures and narrow antibiotics as able. Recommend 6 weeks of antibiotics going forward and will need central line/PICC prior to discharge. Wound care per orthopedics.  PLAN:  1. Continue broad coverage with daptomycin and ceftriaxone.  2. Monitor cultures with narrowing of antibiotics as able.  3. Monitor CK levels per protocol.  4. Wound care per orthopedics.   Principal Problem:   Sepsis due to undetermined organism [facility]) Active Problems:   Essential hypertension   Uncontrolled type 2 diabetes mellitus with hyperglycemia (HCC)   Charcot's joint of ankle, left   Sleep apnea   Class 3 obesity   Acute renal failure superimposed on stage 3a chronic kidney disease (HCC)   Normocytic anemia   Hyponatremia   Hypoalbuminemia   Severe protein-calorie malnutrition (HCC)   Elevated CK   Acute renal failure (HCC)   Altered mental status   Pyogenic inflammation of bone (HCC)   Hardware complicating wound infection (HCC)   . amLODipine  10 mg Oral Daily  . docusate sodium  100 mg Oral BID  . enoxaparin (LOVENOX) injection  30 mg Subcutaneous Q24H  . insulin aspart  0-20 Units Subcutaneous TID WC  . insulin aspart  0-5 Units Subcutaneous QHS  . insulin glargine  10 Units Subcutaneous QHS  . metoprolol tartrate  12.5 mg Oral BID  . pantoprazole  40 mg Oral BID  . senna  1 tablet Oral BID  . vitamin B-12  1,000 mcg Oral Daily    SUBJECTIVE:  Afebrile overnight with no acute events. Moving to the chair beside the bed during visit. No new concerns/complaints.   Allergies  Allergen Reactions  . Lyrica  [Pregabalin] Swelling     Review of Systems: Review of Systems  Constitutional: Negative for chills, fever and weight loss.  Respiratory: Negative for cough, shortness of breath and wheezing.   Cardiovascular: Negative for chest pain and leg swelling.  Gastrointestinal: Negative for abdominal pain, constipation, diarrhea, nausea and vomiting.  Skin: Negative for rash.      OBJECTIVE: Vitals:   02/22/21 1626 02/22/21 1652 02/22/21 2030 02/23/21 1241  BP: (!) 164/87 (!) 159/91 (!) 158/87 (!) 162/83  Pulse:  78 87 78  Resp: 12 16 (!) 21   Temp: 97.9 F (36.6 C) 98.3 F (36.8 C) 98.3 F (36.8 C) 98.2 F (36.8 C)  TempSrc:  Oral Oral Oral  SpO2: 97% 98% 99% 100%  Weight:      Height:       Body mass index is 51.6 kg/m.  Physical Exam Constitutional:      General: He is not in acute distress.    Appearance: He is well-developed. He is obese.     Comments: Seated in the bed; pleasant.   Cardiovascular:     Rate and Rhythm: Normal rate and regular rhythm.     Heart sounds: Normal heart sounds.  Pulmonary:     Effort: Pulmonary effort is normal.     Breath sounds: Normal breath sounds.  Musculoskeletal:     Comments: Surgical dressing on left foot is clean and dry.  Skin:    General: Skin is warm and dry.  Neurological:     Mental Status: He is alert and oriented to person, place, and time.  Psychiatric:        Behavior: Behavior normal.        Thought Content: Thought content normal.        Judgment: Judgment normal.     Lab Results Lab Results  Component Value Date   WBC 7.6 02/23/2021   HGB 8.0 (L) 02/23/2021   HCT 25.3 (L) 02/23/2021   MCV 88.8 02/23/2021   PLT 315 02/23/2021    Lab Results  Component Value Date   CREATININE 1.60 (H) 02/23/2021   BUN 17 02/23/2021   NA 134 (L) 02/23/2021   K 5.1 02/23/2021   CL 106 02/23/2021   CO2 23 02/23/2021    Lab Results  Component Value Date   ALT 14 02/22/2021   AST 21 02/22/2021   ALKPHOS 80  02/22/2021   BILITOT 0.6 02/22/2021     Microbiology: Recent Results (from the past 240 hour(s))  Surgical pcr screen     Status: None   Collection Time: 02/15/21  5:41 AM   Specimen: Nasal Mucosa; Nasal Swab  Result Value Ref Range Status   MRSA, PCR NEGATIVE NEGATIVE Final   Staphylococcus aureus NEGATIVE NEGATIVE Final    Comment: (NOTE) The Xpert SA Assay (FDA approved for NASAL specimens in patients 38 years of age and older), is one component of a comprehensive surveillance program. It is not intended to diagnose infection nor to guide or monitor treatment. Performed at [facility], [address], [place], [place] [address]   Blood culture (routine x 2)     Status: None   Collection Time: 02/17/21  3:36 PM   Specimen: BLOOD RIGHT FOREARM  Result Value Ref Range Status   Specimen Description BLOOD RIGHT FOREARM  Final   Special Requests   Final    BOTTLES DRAWN AEROBIC AND ANAEROBIC Blood Culture adequate volume   Culture   Final    NO GROWTH 5 DAYS Performed at [facility], [address], [place], [place] [address]    Report Status 02/22/2021 FINAL  Final  Resp Panel by RT-PCR (Flu A&B, Covid) Nasopharyngeal Swab     Status: None   Collection Time: 02/17/21  3:41 PM   Specimen: Nasopharyngeal Swab; Nasopharyngeal(NP) swabs in vial transport medium  Result Value Ref Range Status   SARS Coronavirus 2 by RT PCR NEGATIVE NEGATIVE Final    Comment: (NOTE) SARS-CoV-2 target nucleic acids are NOT DETECTED.  The SARS-CoV-2 RNA is generally detectable in upper respiratory specimens during the acute phase of infection. The lowest concentration of SARS-CoV-2 viral copies this assay can detect is 138 copies/mL. A negative result does not preclude SARS-Cov-2 infection and should not be used as the sole basis for treatment or other patient management decisions. A negative result may occur with  improper specimen collection/handling, submission of specimen  other than nasopharyngeal swab, presence of viral mutation(s) within the areas targeted by this assay, and inadequate number of viral copies(<138 copies/mL). A negative result must be combined with clinical observations, patient history, and epidemiological information. The expected result is Negative.  Fact Sheet for Patients:  BloggerCourse.com  Fact Sheet for Healthcare Providers:  SeriousBroker.it  This test is no t yet approved or cleared by the [place] FDA and  has been authorized for detection and/or diagnosis of SARS-CoV-2 by FDA under an  Emergency Use Authorization (EUA). This EUA will remain  in effect (meaning this test can be used) for the duration of the COVID-19 declaration under Section 564(b)(1) of the Act, 21 U.S.C.section 360bbb-3(b)(1), unless the authorization is terminated  or revoked sooner.       Influenza A by PCR NEGATIVE NEGATIVE Final   Influenza B by PCR NEGATIVE NEGATIVE Final    Comment: (NOTE) The Xpert Xpress SARS-CoV-2/FLU/RSV plus assay is intended as an aid in the diagnosis of influenza from Nasopharyngeal swab specimens and should not be used as a sole basis for treatment. Nasal washings and aspirates are unacceptable for Xpert Xpress SARS-CoV-2/FLU/RSV testing.  Fact Sheet for Patients: BloggerCourse.com  Fact Sheet for Healthcare Providers: SeriousBroker.it  This test is not yet approved or cleared by the [place] FDA and has been authorized for detection and/or diagnosis of SARS-CoV-2 by FDA under an Emergency Use Authorization (EUA). This EUA will remain in effect (meaning this test can be used) for the duration of the COVID-19 declaration under Section 564(b)(1) of the Act, 21 U.S.C. section 360bbb-3(b)(1), unless the authorization is terminated or revoked.  Performed at [facility], [address] [place],  [place] [address]   Blood culture (routine x 2)     Status: None   Collection Time: 02/17/21  3:50 PM   Specimen: BLOOD RIGHT HAND  Result Value Ref Range Status   Specimen Description BLOOD RIGHT HAND  Final   Special Requests   Final    BOTTLES DRAWN AEROBIC AND ANAEROBIC Blood Culture adequate volume   Culture   Final    NO GROWTH 5 DAYS Performed at [facility], [address], [place], [place] [address]    Report Status 02/22/2021 FINAL  Final  Urine culture     Status: None   Collection Time: 02/17/21  6:35 PM   Specimen: In/Out Cath Urine  Result Value Ref Range Status   Specimen Description IN/OUT CATH URINE  Final   Special Requests NONE  Final   Culture   Final    NO GROWTH Performed at [facility], [address], [place], [place] [address]    Report Status 02/19/2021 FINAL  Final  Aerobic/Anaerobic Culture w Gram Stain (surgical/deep wound)     Status: None (Preliminary result)   Collection Time: 02/22/21  3:05 PM   Specimen: PATH Soft tissue  Result Value Ref Range Status   Specimen Description TISSUE  Final   Special Requests DEEP TISSUE LEFT ANKLE SPEC A  Final   Gram Stain   Final    RARE WBC PRESENT,BOTH PMN AND MONONUCLEAR NO ORGANISMS SEEN    Culture   Final    NO GROWTH < 24 HOURS Performed at [facility], [address] [place], [place] [address]    Report Status PENDING  Incomplete     Marcos Eke, NP [facility]  02/23/2021  2:57 PM
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
## 2021-02-23 NOTE — Progress Notes (Signed)
Subjective: 1 Day Post-Op Procedure(s) (LRB): Irrigation and debridement left ankle (Left)  Patient reports pain as mild to moderate.  Denies fever, chills, N/V, CP, SOB.  Tolerating POs well.  + BM  Objective:   VITALS:  Temp:  [97.8 F (36.6 C)-98.3 F (36.8 C)] 98.3 F (36.8 C) (04/14 2030) Pulse Rate:  [78-90] 87 (04/14 2030) Resp:  [12-21] 21 (04/14 2030) BP: (131-169)/(76-91) 158/87 (04/14 2030) SpO2:  [97 %-100 %] 99 % (04/14 2030) Weight:  [167.8 kg] 167.8 kg (04/14 1405)  General: WDWN patient in NAD. Psych:  Appropriate mood and affect. Skin: SLS C/D/I, no rashes or lesions Extremities: warm/dry, no visible edema, erythema or echymosis.  No lymphadenopathy. MSK:  ROM: EHL/FHL intact, MMT: able to perform quad set    LABS Recent Labs    02/21/21 0108 02/22/21 0312 02/23/21 0038  HGB 7.3* 7.9* 8.0*  WBC 6.1 6.3 7.6  PLT 250 288 315   Recent Labs    02/22/21 0312 02/23/21 0038  NA 134* 134*  K 4.1 5.1  CL 105 106  CO2 24 23  BUN 17 17  CREATININE 1.59* 1.60*  GLUCOSE 137* 191*   No results for input(s): LABPT, INR in the last 72 hours.   Assessment/Plan: 1 Day Post-Op Procedure(s) (LRB): Irrigation and debridement left ankle (Left)  Day 8 PO S/P L TTC nailing  NWB L LE Intra-op cultures pending ABX per ID recommendation.  Currently on Rocephin, Daptomycin, and Cefepime. DVT ppx:  Lovenox in house.  Xarelto upon D/C (patient has this Rx at home) Plan for 2 week outpatient post-op visit.  Alfredo Martinez PA-C [facility] Office:  [phone]

## 2021-02-24 DIAGNOSIS — N172 Acute kidney failure with medullary necrosis: Secondary | ICD-10-CM | POA: Diagnosis not present

## 2021-02-24 DIAGNOSIS — A419 Sepsis, unspecified organism: Secondary | ICD-10-CM | POA: Diagnosis not present

## 2021-02-24 DIAGNOSIS — M14672 Charcot's joint, left ankle and foot: Secondary | ICD-10-CM | POA: Diagnosis not present

## 2021-02-24 DIAGNOSIS — N179 Acute kidney failure, unspecified: Secondary | ICD-10-CM | POA: Diagnosis not present

## 2021-02-24 LAB — GLUCOSE, CAPILLARY
Glucose-Capillary: 121 mg/dL — ABNORMAL HIGH (ref 70–99)
Glucose-Capillary: 141 mg/dL — ABNORMAL HIGH (ref 70–99)
Glucose-Capillary: 132 mg/dL — ABNORMAL HIGH (ref 70–99)
Glucose-Capillary: 161 mg/dL — ABNORMAL HIGH (ref 70–99)

## 2021-02-24 LAB — BASIC METABOLIC PANEL
Anion gap: 5 (ref 5–15)
BUN: 16 mg/dL (ref 6–20)
CO2: 23 mmol/L (ref 22–32)
Calcium: 8 mg/dL — ABNORMAL LOW (ref 8.9–10.3)
Chloride: 107 mmol/L (ref 98–111)
Creatinine, Ser: 1.48 mg/dL — ABNORMAL HIGH (ref 0.61–1.24)
GFR, Estimated: 60 mL/min — ABNORMAL LOW (ref >60)
Glucose, Bld: 128 mg/dL — ABNORMAL HIGH (ref 70–99)
Potassium: 4.1 mmol/L (ref 3.5–5.1)
Sodium: 135 mmol/L (ref 135–145)

## 2021-02-24 LAB — CBC
HCT: 24.9 % — ABNORMAL LOW (ref 39.0–52.0)
Hemoglobin: 7.9 g/dL — ABNORMAL LOW (ref 13.0–17.0)
MCH: 28.5 pg (ref 26.0–34.0)
MCHC: 31.7 g/dL (ref 30.0–36.0)
MCV: 89.9 fL (ref 80.0–100.0)
Platelets: 325 10*3/uL (ref 150–400)
RBC: 2.77 MIL/uL — ABNORMAL LOW (ref 4.22–5.81)
RDW: 14.9 % (ref 11.5–15.5)
WBC: 8.1 10*3/uL (ref 4.0–10.5)
nRBC: 0.2 % (ref 0.0–0.2)

## 2021-02-24 MED ORDER — ENOXAPARIN SODIUM 80 MG/0.8ML ~~LOC~~ SOLN
80.0000 mg | SUBCUTANEOUS | Status: DC
  Administered 2021-02-24 – 2021-02-27 (×4): 80 mg via SUBCUTANEOUS
  Filled 2021-02-24 (×4): qty 0.8

## 2021-02-24 MED ORDER — HYDRALAZINE HCL 25 MG PO TABS
25.0000 mg | ORAL_TABLET | Freq: Three times a day (TID) | ORAL | Status: DC
  Administered 2021-02-24 – 2021-02-25 (×4): 25 mg via ORAL
  Filled 2021-02-24 (×4): qty 1

## 2021-02-24 MED ORDER — METOPROLOL TARTRATE 25 MG PO TABS
25.0000 mg | ORAL_TABLET | Freq: Two times a day (BID) | ORAL | Status: DC
  Administered 2021-02-24 – 2021-02-26 (×4): 25 mg via ORAL
  Filled 2021-02-24 (×4): qty 1

## 2021-02-24 NOTE — Progress Notes (Signed)
Patient progressing with mobilization and is able to utilize bathroom facilities with use of a wheelchair to go into the BR and a FWRW for transfer onto the toilet.  Has some dyspnea with this process, but overall tolerates it well.  Up in chair x several hours.  Elevated B/P reported to Dr. Kerry Hough, who then adjusted his medication.  Will continue to monitor.

## 2021-02-24 NOTE — Progress Notes (Signed)
Subjective: 2 Days Post-Op Procedure(s) (LRB): Irrigation and debridement left ankle (Left)  Patient reports pain as mild to moderate.  Denies fever, chills, N/V, CP, SOB.  Tolerating POs well.  + BM  Objective:   VITALS:  Temp:  [98.2 F (36.8 C)-98.4 F (36.9 C)] 98.3 F (36.8 C) (04/16 0428) Pulse Rate:  [78-83] 81 (04/16 0428) Resp:  [17-18] 18 (04/16 0428) BP: (156-162)/(77-83) 158/79 (04/16 0428) SpO2:  [100 %] 100 % (04/16 0428)  General: WDWN patient in NAD. Psych:  Appropriate mood and affect. Skin: SLS C/D/I, no rashes or lesions Extremities: warm/dry, no visible edema, erythema or echymosis.  No lymphadenopathy. MSK:  ROM: EHL/FHL intact, MMT: able to perform quad set    LABS Recent Labs    02/22/21 0312 02/23/21 0038 02/24/21 0037  HGB 7.9* 8.0* 7.9*  WBC 6.3 7.6 8.1  PLT 288 315 325   Recent Labs    02/23/21 0038 02/24/21 0037  NA 134* 135  K 5.1 4.1  CL 106 107  CO2 23 23  BUN 17 16  CREATININE 1.60* 1.48*  GLUCOSE 191* 128*   No results for input(s): LABPT, INR in the last 72 hours.   Assessment/Plan: 2 Days Post-Op Procedure(s) (LRB): Irrigation and debridement left ankle (Left)  Day 9 PO S/P L TTC nailing  NWB L LE Intra-op cultures pending ABX per ID recommendation.  Currently on Rocephin, Daptomycin, and Cefepime. DVT ppx:  Lovenox in house.  Xarelto upon D/C (patient has this Rx at home) Plan for 2 week outpatient post-op visit.  Darrick Grinder 8:44 AM 02/24/21
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
## 2021-02-24 NOTE — Plan of Care (Signed)
  Problem: Fluid Volume: Goal: Hemodynamic stability will improve Outcome: Progressing   Problem: Clinical Measurements: Goal: Signs and symptoms of infection will decrease Outcome: Progressing   Problem: Education: Goal: Knowledge of General Education information will improve Description: Including pain rating scale, medication(s)/side effects and non-pharmacologic comfort measures Outcome: Progressing   Problem: Clinical Measurements: Goal: Ability to maintain clinical measurements within normal limits will improve Outcome: Progressing

## 2021-02-25 DIAGNOSIS — A419 Sepsis, unspecified organism: Secondary | ICD-10-CM | POA: Diagnosis not present

## 2021-02-25 DIAGNOSIS — E669 Obesity, unspecified: Secondary | ICD-10-CM | POA: Diagnosis not present

## 2021-02-25 DIAGNOSIS — N172 Acute kidney failure with medullary necrosis: Secondary | ICD-10-CM | POA: Diagnosis not present

## 2021-02-25 DIAGNOSIS — M14672 Charcot's joint, left ankle and foot: Secondary | ICD-10-CM | POA: Diagnosis not present

## 2021-02-25 LAB — GLUCOSE, CAPILLARY
Glucose-Capillary: 127 mg/dL — ABNORMAL HIGH (ref 70–99)
Glucose-Capillary: 120 mg/dL — ABNORMAL HIGH (ref 70–99)
Glucose-Capillary: 139 mg/dL — ABNORMAL HIGH (ref 70–99)
Glucose-Capillary: 110 mg/dL — ABNORMAL HIGH (ref 70–99)

## 2021-02-25 MED ORDER — HYDRALAZINE HCL 50 MG PO TABS
50.0000 mg | ORAL_TABLET | Freq: Three times a day (TID) | ORAL | Status: DC
  Administered 2021-02-25 – 2021-02-27 (×5): 50 mg via ORAL
  Filled 2021-02-25 (×5): qty 1
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
## 2021-02-25 NOTE — Progress Notes (Signed)
   02/25/21 1318  What Happened  Was fall witnessed? Yes  Who witnessed fall? [facility] PT  Patients activity before fall during therapy  Point of contact hip/leg  Was patient injured? No  Follow Up  MD notified Dr. Robina Ade  Time MD notified [phone]  Family notified No - patient refusal  Additional tests No  Simple treatment Other (comment) (pt assisted back to bed no needs requested)  Progress note created (see row info) Yes  Adult Fall Risk Assessment  Risk Factor Category (scoring not indicated) Fall has occurred during this admission (document High fall risk) (02/25/21)  Age 45  Fall History: Fall within 6 months prior to admission 5  Elimination; Bowel and/or Urine Incontinence 0  Elimination; Bowel and/or Urine Urgency/Frequency 0  Medications: includes PCA/Opiates, Anti-convulsants, Anti-hypertensives, Diuretics, Hypnotics, Laxatives, Sedatives, and Psychotropics 5  Patient Care Equipment 2  Mobility-Assistance 2  Mobility-Gait 2  Mobility-Sensory Deficit 0  Altered awareness of immediate physical environment 0  Impulsiveness 0  Lack of understanding of one's physical/cognitive limitations 0  Total Score 16  Patient Fall Risk Level High fall risk  Adult Fall Risk Interventions  Required Bundle Interventions *See Row Information* High fall risk - low, moderate, and high requirements implemented  Additional Interventions Use of appropriate toileting equipment (bedpan, BSC, etc.);PT/OT need assessed if change in mobility from baseline  Screening for Fall Injury Risk (To be completed on HIGH fall risk patients) - Assessing Need for Floor Mats  Risk For Fall Injury- Criteria for Floor Mats Previous fall this admission  Will Implement Floor Mats Yes  Vitals  BP (!) 179/84  BP Location Left Arm  BP Method Automatic  Patient Position (if appropriate) Sitting  Pulse Rate 89  Pulse Rate Source Dinamap  Resp 18  Oxygen Therapy  SpO2 100 %  O2 Device Room Air  Pain  Assessment  Pain Scale 0-10  Pain Score 6 (pt report no new pain)  Pain Type Surgical pain  Pain Location Foot  Pain Orientation Left  Pain Intervention(s) Medication (See eMAR)  PCA/Epidural/Spinal Assessment  Respiratory Pattern Regular;Unlabored  Neurological  Neuro (WDL) WDL  Level of Consciousness Alert  Orientation Level Oriented X4  Cognition Appropriate at baseline  Musculoskeletal  Musculoskeletal (WDL) X  Assistive Device BSC;Front wheel walker  Generalized Weakness Yes  Weight Bearing Restrictions Yes  LLE Weight Bearing NWB  Musculoskeletal Details  RLE Amputated toes  LLE Limited movement  LLE Ortho/Supportive Device Ace wrap;Splint  Integumentary  Integumentary (WDL) X  Skin Integrity Surgical Incision (see LDA)

## 2021-02-25 NOTE — Progress Notes (Signed)
   Subjective: 3 Days Post-Op Procedure(s) (LRB): Irrigation and debridement left ankle (Left) Patient reports pain as moderate.   Patient seen in rounds for Dr. Victorino Dike. Patient is sleeping on exam this morning. He reports moderate pain. He denies abdominal pain, chest pain, N/V. Voiding without difficulty.   Objective: Vital signs in last 24 hours: Temp:  [98.2 F (36.8 C)-98.6 F (37 C)] 98.6 F (37 C) (04/17 0534) Pulse Rate:  [83-91] 86 (04/17 0534) Resp:  [18] 18 (04/17 0534) BP: (155-199)/(74-100) 165/84 (04/17 0534) SpO2:  [98 %-100 %] 98 % (04/17 0534)  Intake/Output from previous day:  Intake/Output Summary (Last 24 hours) at 02/25/2021 0905 Last data filed at 02/25/2021 0541 Gross per 24 hour  Intake 860 ml  Output 1900 ml  Net -1040 ml     Intake/Output this shift: No intake/output data recorded.  Labs: Recent Labs    02/23/21 0038 02/24/21 0037  HGB 8.0* 7.9*   Recent Labs    02/23/21 0038 02/24/21 0037  WBC 7.6 8.1  RBC 2.85* 2.77*  HCT 25.3* 24.9*  PLT 315 325   Recent Labs    02/23/21 0038 02/24/21 0037  NA 134* 135  K 5.1 4.1  CL 106 107  CO2 23 23  BUN 17 16  CREATININE 1.60* 1.48*  GLUCOSE 191* 128*  CALCIUM 7.9* 8.0*   No results for input(s): LABPT, INR in the last 72 hours.  Exam: General - Patient is Alert and Oriented Extremity - Neurologically intact Sensation intact distally Intact pulses distally Good capillary refill Dressing - dressing C/D/I Motor Function - ROM: EHL/FHL intact, MMT: able to perform quad set  Past Medical History:  Diagnosis Date  . Anxiety   . Complication of anesthesia    difficult to wake up last 2 procedures  . Depression   . Diabetes mellitus    type 2  . Diabetes mellitus with neuropathy (HCC) 05/03/2020   Has Humalog Kwikpen Insulin Pump  . Hyperlipidemia   . Hypertension   . Onychomycosis 05/03/2020  . Osteomyelitis of great toe of right foot (HCC) 05/03/2020  . Sleep apnea      Assessment/Plan: 3 Days Post-Op Procedure(s) (LRB): Irrigation and debridement left ankle (Left) Principal Problem:   Sepsis due to undetermined organism [facility]) Active Problems:   Essential hypertension   Uncontrolled type 2 diabetes mellitus with hyperglycemia (HCC)   Charcot's joint of ankle, left   Sleep apnea   Class 3 obesity   Acute renal failure superimposed on stage 3a chronic kidney disease (HCC)   Normocytic anemia   Hyponatremia   Hypoalbuminemia   Severe protein-calorie malnutrition (HCC)   Elevated CK   Acute renal failure (HCC)   Altered mental status   Pyogenic inflammation of bone (HCC)   Hardware complicating wound infection (HCC)  Estimated body mass index is 51.6 kg/m as calculated from the following:   Height as of this encounter: 5\' 11"  (1.803 m).   Weight as of this encounter: 167.8 kg. Advance diet Up with therapy  DVT Prophylaxis - Lovenox  NWB L LE Intra-op cultures pending, no growth so far ABX per ID recommendation.  Currently on Rocephin, Daptomycin, and Cefepime. DVT ppx:  Lovenox in house.  Xarelto upon D/C (patient has this Rx at home) Plan for 2 week outpatient post-op visit.   , PA-C Orthopedic Surgery [phone] 02/25/2021, 9:05 AM
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
## 2021-02-26 ENCOUNTER — Inpatient Hospital Stay (HOSPITAL_COMMUNITY): Payer: 59

## 2021-02-26 DIAGNOSIS — N172 Acute kidney failure with medullary necrosis: Secondary | ICD-10-CM | POA: Diagnosis not present

## 2021-02-26 DIAGNOSIS — G4733 Obstructive sleep apnea (adult) (pediatric): Secondary | ICD-10-CM

## 2021-02-26 DIAGNOSIS — N179 Acute kidney failure, unspecified: Secondary | ICD-10-CM | POA: Diagnosis not present

## 2021-02-26 DIAGNOSIS — E669 Obesity, unspecified: Secondary | ICD-10-CM | POA: Diagnosis not present

## 2021-02-26 DIAGNOSIS — A419 Sepsis, unspecified organism: Secondary | ICD-10-CM | POA: Diagnosis not present

## 2021-02-26 DIAGNOSIS — E668 Other obesity: Secondary | ICD-10-CM | POA: Diagnosis not present

## 2021-02-26 DIAGNOSIS — M14672 Charcot's joint, left ankle and foot: Secondary | ICD-10-CM | POA: Diagnosis not present

## 2021-02-26 HISTORY — PX: IR FLUORO GUIDE CV LINE RIGHT: IMG2283

## 2021-02-26 HISTORY — PX: IR US GUIDE VASC ACCESS RIGHT: IMG2390

## 2021-02-26 LAB — CBC
HCT: 24.5 % — ABNORMAL LOW (ref 39.0–52.0)
Hemoglobin: 7.8 g/dL — ABNORMAL LOW (ref 13.0–17.0)
MCH: 28.7 pg (ref 26.0–34.0)
MCHC: 31.8 g/dL (ref 30.0–36.0)
MCV: 90.1 fL (ref 80.0–100.0)
Platelets: 345 10*3/uL (ref 150–400)
RBC: 2.72 MIL/uL — ABNORMAL LOW (ref 4.22–5.81)
RDW: 15.4 % (ref 11.5–15.5)
WBC: 7.8 10*3/uL (ref 4.0–10.5)
nRBC: 0 % (ref 0.0–0.2)

## 2021-02-26 LAB — BASIC METABOLIC PANEL
Anion gap: 6 (ref 5–15)
BUN: 15 mg/dL (ref 6–20)
CO2: 22 mmol/L (ref 22–32)
Calcium: 8.3 mg/dL — ABNORMAL LOW (ref 8.9–10.3)
Chloride: 107 mmol/L (ref 98–111)
Creatinine, Ser: 1.56 mg/dL — ABNORMAL HIGH (ref 0.61–1.24)
GFR, Estimated: 56 mL/min — ABNORMAL LOW (ref >60)
Glucose, Bld: 104 mg/dL — ABNORMAL HIGH (ref 70–99)
Potassium: 4.2 mmol/L (ref 3.5–5.1)
Sodium: 135 mmol/L (ref 135–145)

## 2021-02-26 LAB — GLUCOSE, CAPILLARY
Glucose-Capillary: 110 mg/dL — ABNORMAL HIGH (ref 70–99)
Glucose-Capillary: 148 mg/dL — ABNORMAL HIGH (ref 70–99)
Glucose-Capillary: 161 mg/dL — ABNORMAL HIGH (ref 70–99)
Glucose-Capillary: 163 mg/dL — ABNORMAL HIGH (ref 70–99)

## 2021-02-26 MED ORDER — LIDOCAINE-EPINEPHRINE 1 %-1:100000 IJ SOLN
INTRAMUSCULAR | Status: AC
  Filled 2021-02-26: qty 1

## 2021-02-26 MED ORDER — OXYCODONE-ACETAMINOPHEN 5-325 MG PO TABS
1.0000 | ORAL_TABLET | ORAL | Status: DC | PRN
  Administered 2021-02-26 – 2021-02-27 (×4): 2 via ORAL
  Filled 2021-02-26 (×4): qty 2

## 2021-02-26 MED ORDER — CHLORHEXIDINE GLUCONATE CLOTH 2 % EX PADS
6.0000 | MEDICATED_PAD | Freq: Every day | CUTANEOUS | Status: DC
  Administered 2021-02-26 – 2021-02-27 (×2): 6 via TOPICAL

## 2021-02-26 MED ORDER — METOPROLOL TARTRATE 50 MG PO TABS
50.0000 mg | ORAL_TABLET | Freq: Two times a day (BID) | ORAL | Status: DC
  Administered 2021-02-26 – 2021-02-27 (×2): 50 mg via ORAL
  Filled 2021-02-26 (×2): qty 1

## 2021-02-26 MED ORDER — LIDOCAINE HCL (PF) 1 % IJ SOLN
INTRAMUSCULAR | Status: DC | PRN
  Administered 2021-02-26: 30 mL
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
## 2021-02-26 NOTE — Progress Notes (Signed)
PT Cancellation Note  Patient Details Name: Kayman Snuffer MRN: [id number] DOB: [birth year]   Cancelled Treatment:    Reason Eval/Treat Not Completed: (P) Other (comment);Pain limiting ability to participate (pt refusing, c/o too tired and LLE too painful.) Encouraged pt to have spouse bring knee scooter in tomorrow so he can use during PT session. Will continue efforts next date per PT POC as schedule permits.   Dorathy Kinsman Aurelio Mccamy 02/26/2021, 2:57 PM

## 2021-02-26 NOTE — Procedures (Signed)
Interventional Radiology Procedure Note  Procedure: Tunneled central venous catheter placement  Findings: Please refer to procedural dictation for full description.  5 Fr dual lumen right IJ tunneled central venous catheter, 22 cm with tip at cavoatrial junction.  Complications: None immediate  Estimated Blood Loss: < 5 mL  Recommendations: Catheter ready for immediate use.   Marliss Coots, MD
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
## 2021-02-26 NOTE — Progress Notes (Signed)
Subjective: No new complaints  Antibiotics:  Anti-infectives (From admission, onward)   Start     Dose/Rate Route Frequency Ordered Stop   02/23/21 2000  DAPTOmycin (CUBICIN) 900 mg in sodium chloride 0.9 % IVPB        8 mg/kg  112.3 kg (Adjusted) 136 mL/hr over 30 Minutes Intravenous Daily 02/23/21 0838 04/05/21 2359   02/22/21 2130  cefTRIAXone (ROCEPHIN) 2 g in sodium chloride 0.9 % 100 mL IVPB        2 g 200 mL/hr over 30 Minutes Intravenous Every 24 hours 02/22/21 1615 04/05/21 2359   02/22/21 1730  DAPTOmycin (CUBICIN) 650 mg in sodium chloride 0.9 % IVPB  Status:  Discontinued        6 mg/kg  112.3 kg (Adjusted) 126 mL/hr over 30 Minutes Intravenous Daily 02/22/21 1650 02/23/21 0838   02/22/21 1544  vancomycin (VANCOCIN) powder  Status:  Discontinued          As needed 02/22/21 1545 02/22/21 1600   02/22/21 1515  ceFEPIme (MAXIPIME) 2 g in sodium chloride 0.9 % 100 mL IVPB        2 g 200 mL/hr over 30 Minutes Intravenous  Once 02/22/21 1512 02/22/21 1552   02/22/21 1514  vancomycin (VANCOCIN) 1-5 GM/200ML-% IVPB       Note to Pharmacy: Tawanna Sathang, Connie   : cabinet override      02/22/21 1514 02/22/21 1621   02/22/21 1505  tobramycin (NEBCIN) powder  Status:  Discontinued          As needed 02/22/21 1544 02/22/21 1600   02/19/21 0600  ceFEPIme (MAXIPIME) 2 g in sodium chloride 0.9 % 100 mL IVPB  Status:  Discontinued        2 g 200 mL/hr over 30 Minutes Intravenous Every 24 hours 02/18/21 0946 02/19/21 1648   02/18/21 1600  vancomycin (VANCOCIN) 2,250 mg in sodium chloride 0.9 % 500 mL IVPB  Status:  Discontinued        2,250 mg 250 mL/hr over 120 Minutes Intravenous Every 24 hours 02/17/21 1550 02/18/21 0704   02/18/21 0200  metroNIDAZOLE (FLAGYL) IVPB 500 mg  Status:  Discontinued        500 mg 100 mL/hr over 60 Minutes Intravenous Every 8 hours 02/18/21 0040 02/19/21 1326   02/17/21 2300  ceFEPIme (MAXIPIME) 2 g in sodium chloride 0.9 % 100 mL IVPB  Status:   Discontinued        2 g 200 mL/hr over 30 Minutes Intravenous Every 8 hours 02/17/21 1550 02/18/21 0946   02/17/21 1600  vancomycin (VANCOCIN) 2,500 mg in sodium chloride 0.9 % 500 mL IVPB        2,500 mg 250 mL/hr over 120 Minutes Intravenous  Once 02/17/21 1541 02/17/21 2155   02/17/21 1545  ceFEPIme (MAXIPIME) 2 g in sodium chloride 0.9 % 100 mL IVPB        2 g 200 mL/hr over 30 Minutes Intravenous  Once 02/17/21 1541 02/17/21 1730   02/17/21 1545  metroNIDAZOLE (FLAGYL) IVPB 500 mg        500 mg 100 mL/hr over 60 Minutes Intravenous  Once 02/17/21 1541 02/17/21 1707      Medications: Scheduled Meds: . amLODipine  10 mg Oral Daily  . Chlorhexidine Gluconate Cloth  6 each Topical Daily  . docusate sodium  100 mg Oral BID  . enoxaparin (LOVENOX) injection  80 mg Subcutaneous Q24H  . hydrALAZINE  50 mg  Oral Q8H  . insulin aspart  0-20 Units Subcutaneous TID WC  . insulin aspart  0-5 Units Subcutaneous QHS  . insulin glargine  10 Units Subcutaneous QHS  . lidocaine-EPINEPHrine      . metoprolol tartrate  50 mg Oral BID  . pantoprazole  40 mg Oral BID  . senna  1 tablet Oral BID  . vitamin B-12  1,000 mcg Oral Daily   Continuous Infusions: . sodium chloride 10 mL/hr at 02/26/21 0555  . cefTRIAXone (ROCEPHIN)  IV Stopped (02/25/21 2218)  . DAPTOmycin (CUBICIN)  IV 900 mg (02/25/21 2043)   PRN Meds:.acetaminophen, HYDROcodone-acetaminophen, lidocaine (PF), morphine injection, ondansetron **OR** ondansetron (ZOFRAN) IV    Objective: Weight change:   Intake/Output Summary (Last 24 hours) at 02/26/2021 1302 Last data filed at 02/26/2021 1200 Gross per 24 hour  Intake 1633.64 ml  Output 2200 ml  Net -566.36 ml   Blood pressure (!) 168/90, pulse 95, temperature 98.4 F (36.9 C), temperature source Oral, resp. rate 18, height  (1.803 m), weight (!) 167.8 kg, SpO2 98 %. Temp:  [98.3 F (36.8 C)-98.4 F (36.9 C)] 98.4 F (36.9 C) (04/18 1149) Pulse Rate:  [86-95] 95  (04/18 1149) Resp:  [18] 18 (04/18 0600) BP: (162-179)/(80-90) 168/90 (04/18 1149) SpO2:  [97 %-100 %] 98 % (04/18 1149)  Physical Exam: Physical Exam Constitutional:      Appearance: He is well-developed.  HENT:     Head: Normocephalic and atraumatic.  Eyes:     Conjunctiva/sclera: Conjunctivae normal.  Cardiovascular:     Rate and Rhythm: Normal rate and regular rhythm.     Heart sounds: No murmur heard. No friction rub. No gallop.   Pulmonary:     Effort: Pulmonary effort is normal. No respiratory distress.     Breath sounds: Normal breath sounds. No stridor. No wheezing.  Abdominal:     General: There is no distension.     Palpations: Abdomen is soft. There is no mass.  Musculoskeletal:        General: Normal range of motion.     Cervical back: Normal range of motion and neck supple.  Skin:    General: Skin is warm and dry.     Findings: No erythema or rash.  Neurological:     General: No focal deficit present.     Mental Status: He is alert and oriented to person, place, and time.  Psychiatric:        Mood and Affect: Mood normal.        Behavior: Behavior normal.        Thought Content: Thought content normal.        Judgment: Judgment normal.     Left ankle bandaged   CBC:    BMET Recent Labs    02/24/21 0037 02/26/21 0150  NA 135 135  K 4.1 4.2  CL 107 107  CO2 23 22  GLUCOSE 128* 104*  BUN 16 15  CREATININE 1.48* 1.56*  CALCIUM 8.0* 8.3*     Liver Panel  No results for input(s): PROT, ALBUMIN, AST, ALT, ALKPHOS, BILITOT, BILIDIR, IBILI in the last 72 hours.     Sedimentation Rate No results for input(s): ESRSEDRATE in the last 72 hours. C-Reactive Protein No results for input(s): CRP in the last 72 hours.  Micro Results: Recent Results (from the past 720 hour(s))  SARS CORONAVIRUS 2 (TAT 6-24 HRS) Nasopharyngeal Nasopharyngeal Swab     Status: None   Collection Time: 02/12/21  9:40 AM   Specimen: Nasopharyngeal Swab  Result Value  Ref Range Status   SARS Coronavirus 2 NEGATIVE NEGATIVE Final    Comment: (NOTE) SARS-CoV-2 target nucleic acids are NOT DETECTED.  The SARS-CoV-2 RNA is generally detectable in upper and lower respiratory specimens during the acute phase of infection. Negative results do not preclude SARS-CoV-2 infection, do not rule out co-infections with other pathogens, and should not be used as the sole basis for treatment or other patient management decisions. Negative results must be combined with clinical observations, patient history, and epidemiological information. The expected result is Negative.  Fact Sheet for Patients: HairSlick.no  Fact Sheet for Healthcare Providers: quierodirigir.com  This test is not yet approved or cleared by the [place] FDA and  has been authorized for detection and/or diagnosis of SARS-CoV-2 by FDA under an Emergency Use Authorization (EUA). This EUA will remain  in effect (meaning this test can be used) for the duration of the COVID-19 declaration under Se ction 564(b)(1) of the Act, 21 U.S.C. section 360bbb-3(b)(1), unless the authorization is terminated or revoked sooner.  Performed at [facility], [address], [place], [place] [address]   Surgical pcr screen     Status: None   Collection Time: 02/15/21  5:41 AM   Specimen: Nasal Mucosa; Nasal Swab  Result Value Ref Range Status   MRSA, PCR NEGATIVE NEGATIVE Final   Staphylococcus aureus NEGATIVE NEGATIVE Final    Comment: (NOTE) The Xpert SA Assay (FDA approved for NASAL specimens in patients 79 years of age and older), is one component of a comprehensive surveillance program. It is not intended to diagnose infection nor to guide or monitor treatment. Performed at [facility], [address] [place], [place] [address]   Blood culture (routine x 2)     Status: None   Collection Time: 02/17/21  3:36 PM   Specimen: BLOOD  RIGHT FOREARM  Result Value Ref Range Status   Specimen Description BLOOD RIGHT FOREARM  Final   Special Requests   Final    BOTTLES DRAWN AEROBIC AND ANAEROBIC Blood Culture adequate volume   Culture   Final    NO GROWTH 5 DAYS Performed at [facility], [address] [place], [place] [address]    Report Status 02/22/2021 FINAL  Final  Resp Panel by RT-PCR (Flu A&B, Covid) Nasopharyngeal Swab     Status: None   Collection Time: 02/17/21  3:41 PM   Specimen: Nasopharyngeal Swab; Nasopharyngeal(NP) swabs in vial transport medium  Result Value Ref Range Status   SARS Coronavirus 2 by RT PCR NEGATIVE NEGATIVE Final    Comment: (NOTE) SARS-CoV-2 target nucleic acids are NOT DETECTED.  The SARS-CoV-2 RNA is generally detectable in upper respiratory specimens during the acute phase of infection. The lowest concentration of SARS-CoV-2 viral copies this assay can detect is 138 copies/mL. A negative result does not preclude SARS-Cov-2 infection and should not be used as the sole basis for treatment or other patient management decisions. A negative result may occur with  improper specimen collection/handling, submission of specimen other than nasopharyngeal swab, presence of viral mutation(s) within the areas targeted by this assay, and inadequate number of viral copies(<138 copies/mL). A negative result must be combined with clinical observations, patient history, and epidemiological information. The expected result is Negative.  Fact Sheet for Patients:  BloggerCourse.com  Fact Sheet for Healthcare Providers:  SeriousBroker.it  This test is no t yet approved or cleared by the  [place] [employer] and  has been authorized for detection and/or diagnosis of SARS-CoV-2 by FDA under an TEFL teacher (EUA). This EUA will remain  in effect (meaning this test can be used) for the duration of the COVID-19 declaration under  Section 564(b)(1) of the Act, 21 U.S.C.section 360bbb-3(b)(1), unless the authorization is terminated  or revoked sooner.       Influenza A by PCR NEGATIVE NEGATIVE Final   Influenza B by PCR NEGATIVE NEGATIVE Final    Comment: (NOTE) The Xpert Xpress SARS-CoV-2/FLU/RSV plus assay is intended as an aid in the diagnosis of influenza from Nasopharyngeal swab specimens and should not be used as a sole basis for treatment. Nasal washings and aspirates are unacceptable for Xpert Xpress SARS-CoV-2/FLU/RSV testing.  Fact Sheet for Patients: BloggerCourse.com  Fact Sheet for Healthcare Providers: SeriousBroker.it  This test is not yet approved or cleared by the [place] FDA and has been authorized for detection and/or diagnosis of SARS-CoV-2 by FDA under an Emergency Use Authorization (EUA). This EUA will remain in effect (meaning this test can be used) for the duration of the COVID-19 declaration under Section 564(b)(1) of the Act, 21 U.S.C. section 360bbb-3(b)(1), unless the authorization is terminated or revoked.  Performed at [facility], [address], [place], [place] [address]   Blood culture (routine x 2)     Status: None   Collection Time: 02/17/21  3:50 PM   Specimen: BLOOD RIGHT HAND  Result Value Ref Range Status   Specimen Description BLOOD RIGHT HAND  Final   Special Requests   Final    BOTTLES DRAWN AEROBIC AND ANAEROBIC Blood Culture adequate volume   Culture   Final    NO GROWTH 5 DAYS Performed at [facility], [address], [place], [place] [address]    Report Status 02/22/2021 FINAL  Final  Urine culture     Status: None   Collection Time: 02/17/21  6:35 PM   Specimen: In/Out Cath Urine  Result Value Ref Range Status   Specimen Description IN/OUT CATH URINE  Final   Special Requests NONE  Final   Culture   Final    NO GROWTH Performed at [facility], [address],  [place], [place] [address]    Report Status 02/19/2021 FINAL  Final  Aerobic/Anaerobic Culture w Gram Stain (surgical/deep wound)     Status: None (Preliminary result)   Collection Time: 02/22/21  3:05 PM   Specimen: PATH Soft tissue  Result Value Ref Range Status   Specimen Description TISSUE  Final   Special Requests DEEP TISSUE LEFT ANKLE SPEC A  Final   Gram Stain   Final    RARE WBC PRESENT,BOTH PMN AND MONONUCLEAR NO ORGANISMS SEEN    Culture   Final    NO GROWTH 4 DAYS NO ANAEROBES ISOLATED; CULTURE IN PROGRESS FOR 5 DAYS Performed at [facility], [address], [place], [place] [address]    Report Status PENDING  Incomplete    Studies/Results: No results found.    Assessment/Plan:  INTERVAL HISTORY:   Cultures unrevealing so far.  Principal Problem:   Sepsis due to undetermined organism [facility]) Active Problems:   Essential hypertension   Uncontrolled type 2 diabetes mellitus with hyperglycemia (HCC)   Charcot's joint of ankle, left   Sleep apnea   Class 3 obesity   Acute renal failure superimposed on stage 3a chronic kidney disease (HCC)   Normocytic anemia  Hyponatremia   Hypoalbuminemia   Severe protein-calorie malnutrition (HCC)   Elevated CK   Acute renal failure (HCC)   Altered mental status   Pyogenic inflammation of bone (HCC)   Hardware complicating wound infection (HCC)    Dwayne Rocha is a 45 y.o. male with diabetes mellitus obesity hypoventilation syndrome likely and obstructive sleep apnea who is now 5 days postoperatively from a left Tilia talocalcaneal nailing Charcot foot foot who developed fever hypoxia and acute kidney injury.  CPK was quite elevated as well on admission.  We did a fairly extensive work-up for fevers of unknown origin.  Plain films and MRI suggested osteomyelitis in his ankle.  Dr Victorino Dike did take the patient back to the OR and he encountered hematomas and no purulent fluid collections.  Cultures from tissue were  sent and so far not getting any organism.  We will continue with current ceftriaxone and daptomycin to complete 6 weeks of therapy with follow-up with me in the clinic.  Hopefully this is not osteomyelitis certainly I hope not solving the bones that are mentioned as such an infection would typically need a more proximal  (typically AKA ))amputation for cure.  It is a complicated case and his Charcot pathology certainly plays a role in the the interpretation.  Hopefully he does well on IV antibiotics   I will plan on following him closely along with orthopedic surgery.     LOS: 9 days   Acey Lav 02/26/2021, 1:02 PM

## 2021-02-26 NOTE — Progress Notes (Signed)
Subjective: 4 Days Post-Op Procedure(s) (LRB): Irrigation and debridement left ankle (Left)  Patient reports pain as mild.  Denies fever, chills, N/V, CP, SOB.  Tolerating POs well.  Admits to BM.  Objective:   VITALS:  Temp:  [98.3 F (36.8 C)-98.4 F (36.9 C)] 98.4 F (36.9 C) (04/18 0600) Pulse Rate:  [86-89] 86 (04/18 0600) Resp:  [18] 18 (04/18 0600) BP: (162-179)/(80-88) 162/80 (04/18 0600) SpO2:  [97 %-100 %] 97 % (04/18 0600)  General: WDWN patient in NAD. Psych:  Appropriate mood and affect. Neuro:  A&O x 3, Moving all extremities, sensation subjectively diminished to light touch due to underlying PN HEENT:  EOMs intact Chest:  Even non-labored respirations Skin: SLS C/D/I, no rashes or lesions Extremities: warm/dry, no visible edema, erythema or echymosis.  No lymphadenopathy. Pulses: Popliteus 2+ MSK:  ROM: EHL/FHL intact, MMT: able to perform quad set    LABS Recent Labs    02/24/21 0037 02/26/21 0150  HGB 7.9* 7.8*  WBC 8.1 7.8  PLT 325 345   Recent Labs    02/24/21 0037 02/26/21 0150  NA 135 135  K 4.1 4.2  CL 107 107  CO2 23 22  BUN 16 15  CREATININE 1.48* 1.56*  GLUCOSE 128* 104*   No results for input(s): LABPT, INR in the last 72 hours.   Assessment/Plan: 4 Days Post-Op Procedure(s) (LRB): Irrigation and debridement left ankle (Left)  NWB L LE Up with therapy Final cultures pending, no growth thus far ABX per ID DVT ppx:  Lovenox in house.  Xarelto upon D/C.  Patient has Rx at home. Plan for 2 week outpatient post-op visit Patient is stable from ortho perspective.   Ortho signing off.  Please call with any questions/concerns.  Alfredo Martinez PA-C [facility] Office:  [phone]

## 2021-02-27 DIAGNOSIS — A419 Sepsis, unspecified organism: Secondary | ICD-10-CM | POA: Diagnosis not present

## 2021-02-27 DIAGNOSIS — N172 Acute kidney failure with medullary necrosis: Secondary | ICD-10-CM | POA: Diagnosis not present

## 2021-02-27 DIAGNOSIS — M14672 Charcot's joint, left ankle and foot: Secondary | ICD-10-CM | POA: Diagnosis not present

## 2021-02-27 DIAGNOSIS — E668 Other obesity: Secondary | ICD-10-CM | POA: Diagnosis not present

## 2021-02-27 DIAGNOSIS — E669 Obesity, unspecified: Secondary | ICD-10-CM | POA: Diagnosis not present

## 2021-02-27 DIAGNOSIS — N179 Acute kidney failure, unspecified: Secondary | ICD-10-CM | POA: Diagnosis not present

## 2021-02-27 LAB — GLUCOSE, CAPILLARY
Glucose-Capillary: 124 mg/dL — ABNORMAL HIGH (ref 70–99)
Glucose-Capillary: 107 mg/dL — ABNORMAL HIGH (ref 70–99)

## 2021-02-27 LAB — AEROBIC/ANAEROBIC CULTURE W GRAM STAIN (SURGICAL/DEEP WOUND): Culture: NO GROWTH

## 2021-02-27 MED ORDER — HYDRALAZINE HCL 100 MG PO TABS: 100.0000 mg | ORAL_TABLET | Freq: Three times a day (TID) | ORAL | 1 refills | Status: DC

## 2021-02-27 MED ORDER — HYDRALAZINE HCL 50 MG PO TABS
50.0000 mg | ORAL_TABLET | ORAL | Status: AC
  Administered 2021-02-27: 50 mg via ORAL
  Filled 2021-02-27: qty 1

## 2021-02-27 MED ORDER — CEFTRIAXONE IV (FOR PTA / DISCHARGE USE ONLY): 2.0000 g | INTRAVENOUS | 0 refills | Status: AC

## 2021-02-27 MED ORDER — DAPTOMYCIN IV (FOR PTA / DISCHARGE USE ONLY): 900.0000 mg | INTRAVENOUS | 0 refills | Status: DC

## 2021-02-27 MED ORDER — METOPROLOL TARTRATE 50 MG PO TABS: 50.0000 mg | ORAL_TABLET | Freq: Two times a day (BID) | ORAL | 1 refills | Status: DC

## 2021-02-27 MED ORDER — HEPARIN SOD (PORK) LOCK FLUSH 100 UNIT/ML IV SOLN
250.0000 [IU] | INTRAVENOUS | Status: AC | PRN
  Administered 2021-02-27 (×2): 250 [IU]
  Filled 2021-02-27: qty 2.5

## 2021-02-27 MED ORDER — HYDRALAZINE HCL 50 MG PO TABS
100.0000 mg | ORAL_TABLET | Freq: Three times a day (TID) | ORAL | Status: DC
  Administered 2021-02-27: 100 mg via ORAL
  Filled 2021-02-27: qty 2

## 2021-02-27 MED ORDER — OXYCODONE HCL 5 MG PO TABS: 5.0000 mg | ORAL_TABLET | ORAL | 0 refills | Status: AC | PRN

## 2021-02-27 MED ORDER — CYANOCOBALAMIN 1000 MCG PO TABS: 1000.0000 ug | ORAL_TABLET | Freq: Every day | ORAL | 1 refills | Status: DC
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
## 2021-02-27 NOTE — TOC Transition Note (Signed)
Transition of Care [facility]) - CM/SW Discharge Note   Patient Details  Name: Dwayne Rocha MRN: [id number] Date of Birth: June 07, 1976  Transition of Care [facility]) CM/SW Contact:  Bess Kinds, RN Phone Number: [phone] 02/27/2021, 1:36 PM   Clinical Narrative:     Notified by MD of patient transitioning home today. Spoke with pharmacy about retiming IV antibiotics today in order for patient to receive prior to discharge. Spoke with Sherrilyn Rist at [facility] RN to follow up with patient tomorrow to initiate home infusion tomorrow. [facility] to schedule with patient. [employer] notified of DME order for RW - will deliver to bedside.   Final next level of care: Home w Home Health Services Barriers to Discharge: No Barriers Identified   Patient Goals and CMS Choice     Choice offered to / list presented to : Patient  Discharge Placement                       Discharge Plan and Services   Discharge Planning Services: CM Consult            DME Arranged: Dan Humphreys rolling DME Agency: [employer] Date DME Agency Contacted: 02/27/21 Time DME Agency Contacted: 1335 Representative spoke with at DME Agency: Velna Hatchet HH Arranged: RN,IV Antibiotics HH Agency: Other - See comment ([facility]) Date HH Agency Contacted: 02/27/21 Time HH Agency Contacted: 1336 Representative spoke with at [facility] Agency: Sherrilyn Rist  Social Determinants of Health (SDOH) Interventions     Readmission Risk Interventions No flowsheet data found.
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
## 2021-02-27 NOTE — TOC Transition Note (Signed)
Transition of Care [facility]) - CM/SW Discharge Note   Patient Details  Name: Dwayne Rocha MRN: [id number] Date of Birth: Sep 20, 1976  [facility]) CM/SW Contact:  Bess Kinds, RN Phone Number: [phone] 02/27/2021, 5:18 PM   Clinical Narrative:     Noted HH orders for RN, PT, OT. Due to payor source unable to find accepting [facility] agency for PT/OT. HH RN for infusion services only to be provided through [employer] with [employer].   Final next level of care: Home w Home Health Services Barriers to Discharge: No Barriers Identified   Patient Goals and CMS Choice     Choice offered to / list presented to : Patient  Discharge Placement                       Discharge Plan and Services   Discharge Planning Services: CM Consult            DME Arranged: Dan Humphreys rolling DME Agency: [employer] Date DME Agency Contacted: 02/27/21 Time DME Agency Contacted: 1335 Representative spoke with at DME Agency: Velna Hatchet HH Arranged: RN,IV Antibiotics HH Agency: Other - See comment ([employer]) Date HH Agency Contacted: 02/27/21 Time HH Agency Contacted: [phone] Representative spoke with at [facility] Agency: Sherrilyn Rist  Social Determinants of Health (SDOH) Interventions     Readmission Risk Interventions No flowsheet data found.

## 2021-02-27 NOTE — Progress Notes (Signed)
Subjective: No new complaints  Antibiotics:  Anti-infectives (From admission, onward)   Start     Dose/Rate Route Frequency Ordered Stop   02/23/21 2000  DAPTOmycin (CUBICIN) 900 mg in sodium chloride 0.9 % IVPB        8 mg/kg  112.3 kg (Adjusted) 136 mL/hr over 30 Minutes Intravenous Daily 02/23/21 0838 04/05/21 2359   02/22/21 2130  cefTRIAXone (ROCEPHIN) 2 g in sodium chloride 0.9 % 100 mL IVPB        2 g 200 mL/hr over 30 Minutes Intravenous Every 24 hours 02/22/21 1615 04/05/21 2359   02/22/21 1730  DAPTOmycin (CUBICIN) 650 mg in sodium chloride 0.9 % IVPB  Status:  Discontinued        6 mg/kg  112.3 kg (Adjusted) 126 mL/hr over 30 Minutes Intravenous Daily 02/22/21 1650 02/23/21 0838   02/22/21 1544  vancomycin (VANCOCIN) powder  Status:  Discontinued          As needed 02/22/21 1545 02/22/21 1600   02/22/21 1515  ceFEPIme (MAXIPIME) 2 g in sodium chloride 0.9 % 100 mL IVPB        2 g 200 mL/hr over 30 Minutes Intravenous  Once 02/22/21 1512 02/22/21 1552   02/22/21 1514  vancomycin (VANCOCIN) 1-5 GM/200ML-% IVPB       Note to Pharmacy: Dwayne Rocha   : cabinet override      02/22/21 1514 02/22/21 1621   02/22/21 1505  tobramycin (NEBCIN) powder  Status:  Discontinued          As needed 02/22/21 1544 02/22/21 1600   02/19/21 0600  ceFEPIme (MAXIPIME) 2 g in sodium chloride 0.9 % 100 mL IVPB  Status:  Discontinued        2 g 200 mL/hr over 30 Minutes Intravenous Every 24 hours 02/18/21 0946 02/19/21 1648   02/18/21 1600  vancomycin (VANCOCIN) 2,250 mg in sodium chloride 0.9 % 500 mL IVPB  Status:  Discontinued        2,250 mg 250 mL/hr over 120 Minutes Intravenous Every 24 hours 02/17/21 1550 02/18/21 0704   02/18/21 0200  metroNIDAZOLE (FLAGYL) IVPB 500 mg  Status:  Discontinued        500 mg 100 mL/hr over 60 Minutes Intravenous Every 8 hours 02/18/21 0040 02/19/21 1326   02/17/21 2300  ceFEPIme (MAXIPIME) 2 g in sodium chloride 0.9 % 100 mL IVPB  Status:   Discontinued        2 g 200 mL/hr over 30 Minutes Intravenous Every 8 hours 02/17/21 1550 02/18/21 0946   02/17/21 1600  vancomycin (VANCOCIN) 2,500 mg in sodium chloride 0.9 % 500 mL IVPB        2,500 mg 250 mL/hr over 120 Minutes Intravenous  Once 02/17/21 1541 02/17/21 2155   02/17/21 1545  ceFEPIme (MAXIPIME) 2 g in sodium chloride 0.9 % 100 mL IVPB        2 g 200 mL/hr over 30 Minutes Intravenous  Once 02/17/21 1541 02/17/21 1730   02/17/21 1545  metroNIDAZOLE (FLAGYL) IVPB 500 mg        500 mg 100 mL/hr over 60 Minutes Intravenous  Once 02/17/21 1541 02/17/21 1707      Medications: Scheduled Meds: . amLODipine  10 mg Oral Daily  . Chlorhexidine Gluconate Cloth  6 each Topical Daily  . docusate sodium  100 mg Oral BID  . enoxaparin (LOVENOX) injection  80 mg Subcutaneous Q24H  . hydrALAZINE  100 mg  Oral Q8H  . insulin aspart  0-20 Units Subcutaneous TID WC  . insulin aspart  0-5 Units Subcutaneous QHS  . insulin glargine  10 Units Subcutaneous QHS  . metoprolol tartrate  50 mg Oral BID  . pantoprazole  40 mg Oral BID  . senna  1 tablet Oral BID  . vitamin B-12  1,000 mcg Oral Daily   Continuous Infusions: . cefTRIAXone (ROCEPHIN)  IV Stopped (02/26/21 2232)  . DAPTOmycin (CUBICIN)  IV Stopped (02/26/21 2334)   PRN Meds:.acetaminophen, lidocaine (PF), ondansetron **OR** ondansetron (ZOFRAN) IV, oxyCODONE-acetaminophen    Objective: Weight change:   Intake/Output Summary (Last 24 hours) at 02/27/2021 1116 Last data filed at 02/27/2021 0520 Gross per 24 hour  Intake 168 ml  Output 1000 ml  Net -832 ml   Blood pressure (!) 173/95, pulse 77, temperature 97.6 F (36.4 C), resp. rate 17, height _0  (1.803 m), weight (!) 167.8 kg, SpO2 98 %. Temp:  [97.6 F (36.4 C)-98.4 F (36.9 C)] 97.6 F (36.4 C) (04/19 0504) Pulse Rate:  [77-95] 77 (04/19 0504) Resp:  [17-18] 17 (04/19 0504) BP: (157-173)/(72-95) 173/95 (04/19 0520) SpO2:  [97 %-98 %] 98 % (04/19  0504)  Physical Exam: Physical Exam Constitutional:      Appearance: He is well-developed.  HENT:     Head: Normocephalic and atraumatic.  Eyes:     Conjunctiva/sclera: Conjunctivae normal.  Cardiovascular:     Rate and Rhythm: Normal rate and regular rhythm.     Heart sounds: No murmur heard. No friction rub. No gallop.   Pulmonary:     Effort: Pulmonary effort is normal. No respiratory distress.     Breath sounds: Normal breath sounds. No stridor. No wheezing.  Abdominal:     General: There is no distension.     Palpations: Abdomen is soft. There is no mass.  Musculoskeletal:        General: Normal range of motion.     Cervical back: Normal range of motion and neck supple.  Skin:    General: Skin is warm and dry.     Findings: No erythema or rash.  Neurological:     General: No focal deficit present.     Mental Status: He is alert and oriented to person, place, and time.  Psychiatric:        Mood and Affect: Mood normal.        Behavior: Behavior normal.        Thought Content: Thought content normal.        Judgment: Judgment normal.     Left ankle bandaged   CBC:    BMET Recent Labs    02/26/21 0150  Rocha 135  K 4.2  CL 107  CO2 22  GLUCOSE 104*  BUN 15  CREATININE 1.56*  CALCIUM 8.3*     Liver Panel  No results for input(s): PROT, ALBUMIN, AST, ALT, ALKPHOS, BILITOT, BILIDIR, IBILI in the last 72 hours.     Sedimentation Rate No results for input(s): ESRSEDRATE in the last 72 hours. C-Reactive Protein No results for input(s): CRP in the last 72 hours.  Micro Results: Recent Results (from the past 720 hour(s))  SARS CORONAVIRUS 2 (TAT 6-24 HRS) Nasopharyngeal Nasopharyngeal Swab     Status: None   Collection Time: 02/12/21  9:40 AM   Specimen: Nasopharyngeal Swab  Result Value Ref Range Status   SARS Coronavirus 2 NEGATIVE NEGATIVE Final    Comment: (NOTE) SARS-CoV-2 target nucleic acids are NOT  DETECTED.  The SARS-CoV-2 RNA is  generally detectable in upper and lower respiratory specimens during the acute phase of infection. Negative results do not preclude SARS-CoV-2 infection, do not rule out co-infections with other pathogens, and should not be used as the sole basis for treatment or other patient management decisions. Negative results must be combined with clinical observations, patient history, and epidemiological information. The expected result is Negative.  Fact Sheet for Patients: SugarRoll.be  Fact Sheet for Healthcare Providers: https://www.woods-mathews.com/  This test is not yet approved or cleared by the [place] FDA and  has been authorized for detection and/or diagnosis of SARS-CoV-2 by FDA under an Emergency Use Authorization (EUA). This EUA will remain  in effect (meaning this test can be used) for the duration of the COVID-19 declaration under Se ction 564(b)(1) of the Act, 21 U.S.C. section 360bbb-3(b)(1), unless the authorization is terminated or revoked sooner.  Performed at [facility], [place] [address] [place], [place] [address]   Surgical pcr screen     Status: None   Collection Time: 02/15/21  5:41 AM   Specimen: Nasal Mucosa; Nasal Swab  Result Value Ref Range Status   MRSA, PCR NEGATIVE NEGATIVE Final   Staphylococcus aureus NEGATIVE NEGATIVE Final    Comment: (NOTE) The Xpert SA Assay (FDA approved for NASAL specimens in patients 60 years of age and older), is one component of a comprehensive surveillance program. It is not intended to diagnose infection nor to guide or monitor treatment. Performed at [facility], [place] [address] [place], [place] [address]   Blood culture (routine x 2)     Status: None   Collection Time: 02/17/21  3:36 PM   Specimen: BLOOD RIGHT FOREARM  Result Value Ref Range Status   Specimen Description BLOOD RIGHT FOREARM  Final   Special Requests   Final    BOTTLES DRAWN AEROBIC AND  ANAEROBIC Blood Culture adequate volume   Culture   Final    NO GROWTH 5 DAYS Performed at [facility], [place] [address] [place], [place] [address]    Report Status 02/22/2021 FINAL  Final  Resp Panel by RT-PCR (Flu A&B, Covid) Nasopharyngeal Swab     Status: None   Collection Time: 02/17/21  3:41 PM   Specimen: Nasopharyngeal Swab; Nasopharyngeal(NP) swabs in vial transport medium  Result Value Ref Range Status   SARS Coronavirus 2 by RT PCR NEGATIVE NEGATIVE Final    Comment: (NOTE) SARS-CoV-2 target nucleic acids are NOT DETECTED.  The SARS-CoV-2 RNA is generally detectable in upper respiratory specimens during the acute phase of infection. The lowest concentration of SARS-CoV-2 viral copies this assay can detect is 138 copies/mL. A negative result does not preclude SARS-Cov-2 infection and should not be used as the sole basis for treatment or other patient management decisions. A negative result may occur with  improper specimen collection/handling, submission of specimen other than nasopharyngeal swab, presence of viral mutation(s) within the areas targeted by this assay, and inadequate number of viral copies(<138 copies/mL). A negative result must be combined with clinical observations, patient history, and epidemiological information. The expected result is Negative.  Fact Sheet for Patients:  EntrepreneurPulse.com.au  Fact Sheet for Healthcare Providers:  IncredibleEmployment.be  This test is no t yet approved or cleared by the [place] FDA and  has been authorized for detection and/or diagnosis of SARS-CoV-2 by FDA under an Emergency Use Authorization (EUA). This EUA will remain  in effect (meaning this test  can be used) for the duration of the COVID-19 declaration under Section 564(b)(1) of the Act, 21 U.S.C.section 360bbb-3(b)(1), unless the authorization is terminated  or revoked sooner.       Influenza A by PCR  NEGATIVE NEGATIVE Final   Influenza B by PCR NEGATIVE NEGATIVE Final    Comment: (NOTE) The Xpert Xpress SARS-CoV-2/FLU/RSV plus assay is intended as an aid in the diagnosis of influenza from Nasopharyngeal swab specimens and should not be used as a sole basis for treatment. Nasal washings and aspirates are unacceptable for Xpert Xpress SARS-CoV-2/FLU/RSV testing.  Fact Sheet for Patients: EntrepreneurPulse.com.au  Fact Sheet for Healthcare Providers: IncredibleEmployment.be  This test is not yet approved or cleared by the [place] FDA and has been authorized for detection and/or diagnosis of SARS-CoV-2 by FDA under an Emergency Use Authorization (EUA). This EUA will remain in effect (meaning this test can be used) for the duration of the COVID-19 declaration under Section 564(b)(1) of the Act, 21 U.S.C. section 360bbb-3(b)(1), unless the authorization is terminated or revoked.  Performed at [facility], [place] [address], [place], [place] [address]   Blood culture (routine x 2)     Status: None   Collection Time: 02/17/21  3:50 PM   Specimen: BLOOD RIGHT HAND  Result Value Ref Range Status   Specimen Description BLOOD RIGHT HAND  Final   Special Requests   Final    BOTTLES DRAWN AEROBIC AND ANAEROBIC Blood Culture adequate volume   Culture   Final    NO GROWTH 5 DAYS Performed at [facility], [place] [address], [place], [place] [address]    Report Status 02/22/2021 FINAL  Final  Urine culture     Status: None   Collection Time: 02/17/21  6:35 PM   Specimen: In/Out Cath Urine  Result Value Ref Range Status   Specimen Description IN/OUT CATH URINE  Final   Special Requests NONE  Final   Culture   Final    NO GROWTH Performed at [facility], [place] [address] [place], [place] [address]    Report Status 02/19/2021 FINAL  Final  Aerobic/Anaerobic Culture w Gram Stain (surgical/deep wound)     Status: None (Preliminary  result)   Collection Time: 02/22/21  3:05 PM   Specimen: PATH Soft tissue  Result Value Ref Range Status   Specimen Description TISSUE  Final   Special Requests DEEP TISSUE LEFT ANKLE SPEC A  Final   Gram Stain   Final    RARE WBC PRESENT,BOTH PMN AND MONONUCLEAR NO ORGANISMS SEEN    Culture   Final    NO GROWTH 4 DAYS NO ANAEROBES ISOLATED; CULTURE IN PROGRESS FOR 5 DAYS Performed at [facility], [place] [address], [place], [place] [address]    Report Status PENDING  Incomplete    Studies/Results: IR Fluoro Guide CV Line Right  Result Date: 02/26/2021 INDICATION: 45 year old male with history of recent left ankle irrigation and debridement and chronic kidney disease requiring central venous access for long-term antibiotics. EXAM: 1. Ultrasound-guided venipuncture of the jugular vein 2. Fluoroscopic guided placement of tunneled central venous catheter MEDICATIONS: None. ANESTHESIA/SEDATION: Local anesthesia only. FLUOROSCOPY TIME:  0.1 minutes, (11 mGy). COMPLICATIONS: None immediate. PROCEDURE: Informed written consent was obtained from the patient after a discussion of the risks, benefits, and alternatives to treatment. Questions regarding the procedure were encouraged and answered. The right neck and chest were prepped with chlorhexidine in a sterile fashion, and a sterile  drape was applied covering the operative field. Maximum barrier sterile technique with sterile gowns and gloves were used for the procedure. A timeout was performed prior to the initiation of the procedure. After creating a small venotomy incision, a 21 gauge micropuncture kit was utilized to access the internal jugular vein. Real-time ultrasound guidance was utilized for vascular access including the acquisition of a permanent ultrasound image documenting patency of the accessed vessel. A Mandril wire to the level of the cavoatrial junction. A 6 French tunneled central venous catheter measuring 22 cm from tip to cuff  was tunneled in a retrograde fashion from the anterior chest wall to the venotomy incision. A peel-away sheath was placed over the wire. The catheter was then placed through the peel-away sheath with the catheter tip ultimately positioned at the cavoatrial junction. Final catheter positioning was confirmed and documented with a spot radiographic image. The catheter aspirates and flushes normally. The catheter was flushed with appropriate volume heparin dwells. The catheter exit site was secured with a 0-Prolene retention suture. The venotomy incision was closed with Dermabond. Sterile dressings were applied. The patient tolerated the procedure well without immediate post procedural complication. IMPRESSION: Successful placement of 22 cm tip to cuff tunneled central venous catheter via the right internal jugular vein with catheter tip terminating at the cavoatrial junction. The catheter is ready for immediate use. Dwayne Cancer, MD [facility] Electronically Signed   By: Dwayne Cancer MD   On: 02/26/2021 13:16   IR US Guide Vasc Access Right  Result Date: 02/26/2021 INDICATION: 45 year old male with history of recent left ankle irrigation and debridement and chronic kidney disease requiring central venous access for long-term antibiotics. EXAM: 1. Ultrasound-guided venipuncture of the jugular vein 2. Fluoroscopic guided placement of tunneled central venous catheter MEDICATIONS: None. ANESTHESIA/SEDATION: Local anesthesia only. FLUOROSCOPY TIME:  0.1 minutes, (11 mGy). COMPLICATIONS: None immediate. PROCEDURE: Informed written consent was obtained from the patient after a discussion of the risks, benefits, and alternatives to treatment. Questions regarding the procedure were encouraged and answered. The right neck and chest were prepped with chlorhexidine in a sterile fashion, and a sterile drape was applied covering the operative field. Maximum barrier  sterile technique with sterile gowns and gloves were used for the procedure. A timeout was performed prior to the initiation of the procedure. After creating a small venotomy incision, a 21 gauge micropuncture kit was utilized to access the internal jugular vein. Real-time ultrasound guidance was utilized for vascular access including the acquisition of a permanent ultrasound image documenting patency of the accessed vessel. A Mandril wire to the level of the cavoatrial junction. A 6 French tunneled central venous catheter measuring 22 cm from tip to cuff was tunneled in a retrograde fashion from the anterior chest wall to the venotomy incision. A peel-away sheath was placed over the wire. The catheter was then placed through the peel-away sheath with the catheter tip ultimately positioned at the cavoatrial junction. Final catheter positioning was confirmed and documented with a spot radiographic image. The catheter aspirates and flushes normally. The catheter was flushed with appropriate volume heparin dwells. The catheter exit site was secured with a 0-Prolene retention suture. The venotomy incision was closed with Dermabond. Sterile dressings were applied. The patient tolerated the procedure well without immediate post procedural complication. IMPRESSION: Successful placement of 22 cm tip to cuff tunneled central venous catheter via the right internal jugular vein with catheter tip terminating at the cavoatrial junction. The catheter is  ready for immediate use. Dwayne Cancer, MD [facility] Electronically Signed   By: Dwayne Cancer MD   On: 02/26/2021 13:16      Assessment/Plan:  INTERVAL HISTORY:   Patient has central  IV placed  Principal Problem:   Sepsis due to undetermined organism [facility]) Active Problems:   Essential hypertension   Uncontrolled type 2 diabetes mellitus with hyperglycemia ([place])   Charcot's joint of ankle, left   Sleep  apnea   Class 3 obesity   Acute renal failure superimposed on stage 3a chronic kidney disease (HCC)   Normocytic anemia   Hyponatremia   Hypoalbuminemia   Severe protein-calorie malnutrition (HCC)   Elevated CK   Acute renal failure (HCC)   Altered mental status   Pyogenic inflammation of bone ([facility])   Hardware complicating wound infection (HCC)    Dwayne Rocha is a 45 y.o. male with diabetes mellitus obesity hypoventilation syndrome likely and obstructive sleep apnea who is now 5 days postoperatively from a left Tilia talocalcaneal nailing Charcot foot foot who developed fever hypoxia and acute kidney injury.  CPK was quite elevated as well on admission.  We did a fairly extensive work-up for fevers of unknown origin.  Plain films and MRI suggested osteomyelitis in his ankle.  Dr Dwayne Rocha did take the patient back to the OR and he encountered hematomas and no purulent fluid collections.  Cultures from tissue were sent and so far not getting any organism.  We will continue with current ceftriaxone and daptomycin to complete 6 weeks of therapy with follow-up with me in the clinic.  Hopefully this is not osteomyelitis certainly I hope not solving the bones that are mentioned as such an infection would typically need a more proximal  (typically AKA ))amputation for cure.  It is a complicated case and his Charcot pathology certainly plays a role in the the interpretation.  He is nearing discharge to home.   Dwayne Rocha has an appointment on 03/29/2021 at 1045 with Dr. Tommy Rocha  He should arrive 15 to 30 minutes prior to his appointment  [facility] is located in the [facility] at  [place] in [place].  [address], which is located to the left of the elevators.  Phone: [phone]  Fax: [phone]  [email or web address]       LOS: 10 days   Dwayne Rocha 02/27/2021, 11:16 AM

## 2021-02-27 NOTE — Progress Notes (Signed)
[name] to be D/C'd  per MD order. Discussed with the patient and all questions fully answered.  VSS, Skin clean, dry and intact without evidence of skin break down, no evidence of skin tears noted.  IV catheter discontinued intact. Site without signs and symptoms of complications. Dressing and pressure applied. Patient receive rocephin at 1442 and cubicin at 1550 before discharge home. Patient PICC is flushed and saline lock by IV team.  An After Visit Summary was printed and given to the patient. Patient received prescription.  Patient instructed to return to ED, call 911, or call MD for any changes in condition.   Patient to be escorted via WC, and D/C home via private auto.

## 2021-02-27 NOTE — TOC Progression Note (Signed)
Transition of Care [facility]) - Progression Note    Patient Details  Name: Dwayne Rocha MRN: [id number] Date of Birth: 28-Jul-1976  Transition of Care [facility]) CM/SW Contact  Bess Kinds, RN Phone Number: [phone] 02/27/2021, 10:28 AM  Clinical Narrative:     Follow up to transition planning from previous CM. Patient IV antibiotics have been arranged through [facility] who will use [facility] for infusion RN. [facility] assisted with transition of care to [facility] d/t [employer] not having contract with patient's secondary insurance.   Expected Discharge Plan: Home w Home Health Services Barriers to Discharge: Continued Medical Work up  Expected Discharge Plan and Services Expected Discharge Plan: Home w Home Health Services   Discharge Planning Services: CM Consult                     DME Arranged: Other see comment (IV ABX therapy) DME Agency: Other - Comment ([facility]) Date DME Agency Contacted: 02/26/21 Time DME Agency Contacted: 1740 Representative spoke with at DME Agency: Pam HH Arranged: RN HH Agency: Other - See comment Date HH Agency Contacted: 02/26/21 Time HH Agency Contacted: [phone] Representative spoke with at [facility] Agency: Pam   Social Determinants of Health (SDOH) Interventions    Readmission Risk Interventions No flowsheet data found.

## 2021-02-27 NOTE — Discharge Summary (Addendum)
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 jugular vein with catheter tip terminating at the cavoatrial junction. The catheter is ready for immediate use. Ruthann Cancer, MD [facility] Electronically Signed   By: Ruthann Cancer MD   On: 02/26/2021 13:16   DG Chest Port 1 View  Result Date: 02/17/2021 CLINICAL DATA:  Fever and sepsis. EXAM: PORTABLE CHEST 1 VIEW COMPARISON:  02/03/2020 FINDINGS: 1540 hours. Low volume film. The cardiopericardial silhouette is within normal limits for size. The lungs are clear without focal pneumonia, edema, pneumothorax or pleural effusion. Telemetry leads overlie the chest. IMPRESSION: No active disease. Electronically Signed   By: Misty Stanley M.D.   On: 02/17/2021 15:59   DG C-Arm 1-60 Min  Result Date: 02/15/2021 CLINICAL DATA:  Ankle  arthrodesis EXAM: LEFT ANKLE COMPLETE - 3+ VIEW; DG C-ARM 1-60 MIN COMPARISON:  CT left ankle 09/05/2020 FINDINGS: Extensive degenerative change and destruction in the midfoot and talus and also involving the anterior calcaneus.  Placement of a rod through the distal tibia into the calcaneus. Locking screws in the proximal rod also calcaneus. IMPRESSION: Arthrodesis left ankle Electronically Signed   By: Franchot Gallo M.D.   On: 02/15/2021 11:39   CT Renal Stone Study  Result Date: 02/17/2021 CLINICAL DATA:  Flank pain, kidney stones suspected, acute kidney injury EXAM: CT ABDOMEN AND PELVIS WITHOUT CONTRAST TECHNIQUE: Multidetector CT imaging of the abdomen and pelvis was performed following the standard protocol without IV contrast. COMPARISON:  12/13/2017 FINDINGS: Lower chest: No acute abnormality. Hepatobiliary: No solid liver abnormality is seen. No gallstones, gallbladder wall thickening, or biliary dilatation. Pancreas: Unremarkable. No pancreatic ductal dilatation or surrounding inflammatory changes. Spleen: Normal in size without significant abnormality. Adrenals/Urinary Tract: Adrenal glands are unremarkable. Kidneys are normal, without renal calculi, solid lesion, or hydronephrosis. Bladder is unremarkable. Stomach/Bowel: Stomach is within normal limits. Appendix appears normal. No evidence of bowel wall thickening, distention, or inflammatory changes. Vascular/Lymphatic: No significant vascular findings are present. No enlarged abdominal or pelvic lymph nodes. Reproductive: No mass or other significant abnormality. Other: Mild anasarca.  No abdominopelvic ascites. Musculoskeletal: No acute or significant osseous findings. IMPRESSION: 1. No non-contrast CT findings of the abdomen or pelvis to explain flank pain. No evidence of urinary tract calculus or hydronephrosis. 2.  Mild anasarca. Electronically Signed   By: Eddie Candle M.D.   On: 02/17/2021 17:48   VAS [place] LOWER EXTREMITY VENOUS  (DVT)  Result Date: 02/18/2021  Lower Venous DVT Study Indications: Swelling. Other Indications: Status post Open treatment of left subtalar dislocation, left                    talonavicular joint dislocation, left navicular tibial                    arthrodesis and left calcaneal tibial arthrodesis 02/15/21. Limitations: Body habitus, bandages and significant edema. Comparison Study: Prior negative Left lower extremity venous duplex done                   01/05/21 Performing Technologist: Sharion Dove RVS  Examination Guidelines: A complete evaluation includes B-mode imaging, spectral Doppler, color Doppler, and power Doppler as needed of all accessible portions of each vessel. Bilateral testing is considered an integral part of a complete examination. Limited examinations for reoccurring indications may be performed as noted. The reflux portion of the exam is performed with the patient in reverse Trendelenburg.  Right Technical Findings: Right leg not evaluated.  +---------+---------------+---------+-----------+----------+-------------------+ LEFT     CompressibilityPhasicitySpontaneityPropertiesThrombus Aging      +---------+---------------+---------+-----------+----------+-------------------+ CFV                     Yes      Yes                  patent by color and                                                       Doppler             +---------+---------------+---------+-----------+----------+-------------------+ FV Prox                 Yes      Yes  patent by color and                                                       Doppler             +---------+---------------+---------+-----------+----------+-------------------+ FV Mid                  Yes      Yes                  patent by color and                                                       Doppler              +---------+---------------+---------+-----------+----------+-------------------+ FV Distal               Yes      Yes                  patent by color and                                                       Doppler             +---------+---------------+---------+-----------+----------+-------------------+ PFV                                                   Not well visualized +---------+---------------+---------+-----------+----------+-------------------+ POP                     Yes      Yes                  patent by color and                                                       Doppler             +---------+---------------+---------+-----------+----------+-------------------+ PTV                                                   Not well visualized +---------+---------------+---------+-----------+----------+-------------------+ PERO                                                  Not well visualized +---------+---------------+---------+-----------+----------+-------------------+     Summary: LEFT: - There is no evidence of deep vein thrombosis in the lower extremity.  However, portions of this examination were limited- see technologist comments above.  *See table(s) above for measurements and observations. Electronically signed by Deitra Mayo MD on 02/18/2021 at 4:05:05 PM.    Final       Subjective: Patient reports that he is still having foot pain, but feels that it is something that he can manage at home with pain medications.  He is anxious to discharge home today.  Discharge Exam: Vitals:   02/26/21 2057 02/26/21 2210 02/27/21 0504 02/27/21 0520  BP: (!) 157/72 (!) 173/89 (!) 173/95 (!) 173/95  Pulse: 88 87 77   Resp: 18  17   Temp: 98.4 F (36.9 C)  97.6 F (36.4 C)   TempSrc:      SpO2: 97%  98%   Weight:      Height:        General: Pt is alert, awake, not in acute distress Cardiovascular: RRR, S1/S2 +, no rubs, no  gallops Respiratory: CTA bilaterally, no wheezing, no rhonchi Abdominal: Soft, NT, ND, bowel sounds + Extremities: Left lower extremity is wrapped in dressings    The results of significant diagnostics from this hospitalization (including imaging, microbiology, ancillary and laboratory) are listed below for reference.     Microbiology: Recent Results (from the past 240 hour(s))  Urine culture     Status: None   Collection Time: 02/17/21  6:35 PM   Specimen: In/Out Cath Urine  Result Value Ref Range Status   Specimen Description IN/OUT CATH URINE  Final   Special Requests NONE  Final   Culture   Final    NO GROWTH Performed at [facility], [address], [place], [place] [address]    Report Status 02/19/2021 FINAL  Final  Aerobic/Anaerobic Culture w Gram Stain (surgical/deep wound)     Status: None   Collection Time: 02/22/21  3:05 PM   Specimen: PATH Soft tissue  Result Value Ref Range Status   Specimen Description TISSUE  Final   Special Requests DEEP TISSUE LEFT ANKLE SPEC A  Final   Gram Stain   Final    RARE WBC PRESENT,BOTH PMN AND MONONUCLEAR NO ORGANISMS SEEN    Culture   Final    No growth aerobically or anaerobically. Performed at [facility], [place] [address] [place], [place] [address]    Report Status 02/27/2021 FINAL  Final     Labs: BNP (last 3 results) No results for input(s): BNP in the last 8760 hours. Basic Metabolic Panel: Recent Labs  Lab 02/21/21 0108 02/22/21 0312 02/23/21 0038 02/24/21 0037 02/26/21 0150  NA 138 134* 134* 135 135  K 4.4 4.1 5.1 4.1 4.2  CL 107 105 106 107 107  CO2 _0 GLUCOSE 128* 137* 191* 128* 104*  BUN 22* _1 CREATININE 1.75* 1.59* 1.60* 1.48* 1.56*  CALCIUM 8.4* 8.0* 7.9* 8.0* 8.3*   Liver Function Tests: Recent Labs  Lab 02/21/21 0108 02/22/21 0312  AST 17 21  ALT 12 14  ALKPHOS 75 80  BILITOT 0.6 0.6  PROT 5.9* 6.1*  ALBUMIN 2.0* 2.1*   No results for input(s):  LIPASE, AMYLASE in the last 168 hours. No results for input(s): AMMONIA in the last 168 hours. CBC: Recent Labs  Lab 02/21/21 0108 02/22/21 0312 02/23/21 0038 02/24/21 0037 02/26/21 0150  WBC 6.1 6.3 7.6 8.1 7.8  HGB 7.3* 7.9* 8.0* 7.9* 7.8*  HCT 23.2* 24.7* 25.3* 24.9* 24.5*  MCV  89.2 89.5 88.8 89.9 90.1  PLT 250 288 315 325 345   Cardiac Enzymes: Recent Labs  Lab 02/23/21 0038  CKTOTAL 184   BNP: Invalid input(s): POCBNP CBG: Recent Labs  Lab 02/26/21 1143 02/26/21 1703 02/26/21 2055 02/27/21 0821 02/27/21 1220  GLUCAP 148* 161* 163* 124* 107*   D-Dimer No results for input(s): DDIMER in the last 72 hours. Hgb A1c No results for input(s): HGBA1C in the last 72 hours. Lipid Profile No results for input(s): CHOL, HDL, LDLCALC, TRIG, CHOLHDL, LDLDIRECT in the last 72 hours. Thyroid function studies No results for input(s): TSH, T4TOTAL, T3FREE, THYROIDAB in the last 72 hours.  Invalid input(s): FREET3 Anemia work up No results for input(s): VITAMINB12, FOLATE, FERRITIN, TIBC, IRON, RETICCTPCT in the last 72 hours. Urinalysis    Component Value Date/Time   COLORURINE AMBER (A) 02/17/2021 1835   APPEARANCEUR CLOUDY (A) 02/17/2021 1835   LABSPEC 1.016 02/17/2021 1835   PHURINE 5.0 02/17/2021 1835   GLUCOSEU NEGATIVE 02/17/2021 1835   HGBUR NEGATIVE 02/17/2021 [place] 02/17/2021 [place] 02/17/2021 1835   PROTEINUR 100 (A) 02/17/2021 1835   NITRITE NEGATIVE 02/17/2021 1835   LEUKOCYTESUR NEGATIVE 02/17/2021 1835   Sepsis Labs Invalid input(s): PROCALCITONIN,  WBC,  LACTICIDVEN Microbiology Recent Results (from the past 240 hour(s))  Urine culture     Status: None   Collection Time: 02/17/21  6:35 PM   Specimen: In/Out Cath Urine  Result Value Ref Range Status   Specimen Description IN/OUT CATH URINE  Final   Special Requests NONE  Final   Culture   Final    NO GROWTH Performed at [facility], [place]  [address], [place], [place] [address]    Report Status 02/19/2021 FINAL  Final  Aerobic/Anaerobic Culture w Gram Stain (surgical/deep wound)     Status: None   Collection Time: 02/22/21  3:05 PM   Specimen: PATH Soft tissue  Result Value Ref Range Status   Specimen Description TISSUE  Final   Special Requests DEEP TISSUE LEFT ANKLE SPEC A  Final   Gram Stain   Final    RARE WBC PRESENT,BOTH PMN AND MONONUCLEAR NO ORGANISMS SEEN    Culture   Final    No growth aerobically or anaerobically. Performed at [facility], [place] [address], [place], [place] [address]    Report Status 02/27/2021 FINAL  Final     Time coordinating discharge: 20mns  SIGNED:   JKathie Dike MD  [facility] 02/27/2021, 4:41 PM   If 7PM-7AM, please contact night-coverage [email or web address]

## 2021-02-28 ENCOUNTER — Encounter (HOSPITAL_COMMUNITY): Payer: Self-pay | Admitting: Orthopedic Surgery

## 2021-03-09 ENCOUNTER — Telehealth: Payer: Self-pay | Admitting: Family

## 2021-03-09 DIAGNOSIS — M869 Osteomyelitis, unspecified: Secondary | ICD-10-CM

## 2021-03-09 MED ORDER — DOXYCYCLINE HYCLATE 100 MG PO TABS
100.0000 mg | ORAL_TABLET | Freq: Two times a day (BID) | ORAL | 1 refills | Status: DC
Start: 2021-03-09 — End: 2021-04-27

## 2021-03-09 NOTE — Telephone Encounter (Signed)
Dwayne Rocha is receiving treatment for osteomyelitis and received weekly lab work with CK level of 1, 473. Notified [facility] to stop daptomycin and continue with ceftriaxone. Will send in doxycycline to replace for MRSA coverage. Spoke with Dwayne Rocha regarding these changes and he is clear on the changes.   Marcos Eke, NP 03/09/2021 9:46 AM

## 2021-03-09 NOTE — Telephone Encounter (Signed)
Received call today from [facility] with [facility] regarding labs faxed in error. States patient is not being followed by them. Per note patient is being followed by [facility] in [place]. Spoke with Weyman Croon and relayed new orders per [place], FNP. Will fax orders as requested from [facility] pharmacy team P: [phone] F: [phone]

## 2021-03-09 NOTE — Telephone Encounter (Addendum)
Received call from [facility], Pharmacist  regarding critical lab value. Per [place], FNP's note lab was already addressed. Nothing further needed at this time.  Juanita Laster, RMA

## 2021-03-12 ENCOUNTER — Telehealth: Payer: Self-pay

## 2021-03-12 NOTE — Telephone Encounter (Signed)
Confirmed with [facility] pharmacist to continue ceftriaxone thru 5/29. Oral doxycycline prescribed to cover for MRSA. No additional needs at this time.   Bostyn Kunkler Loyola Mast, RN

## 2021-03-12 NOTE — Telephone Encounter (Signed)
Received call from patient's pharmacy reporting that the patient discontinued his ceftriaxone infusion instead of the daptomycin. He infused daptomycin x2 despite provider instructions telling him not to. Dapto was discontinued due to elevated CK levels. Pharmacist called to determine what needed to happen next. Forwarding to provider and [facility] team to review.   Melanni Benway Loyola Mast, RN

## 2021-03-14 ENCOUNTER — Ambulatory Visit: Payer: 59 | Attending: Internal Medicine

## 2021-03-14 ENCOUNTER — Other Ambulatory Visit: Payer: Self-pay

## 2021-03-14 DIAGNOSIS — M6281 Muscle weakness (generalized): Secondary | ICD-10-CM | POA: Insufficient documentation
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
## 2021-03-20 ENCOUNTER — Encounter: Payer: Self-pay | Admitting: Infectious Disease

## 2021-03-28 LAB — FUNGUS CULTURE WITH STAIN

## 2021-03-28 LAB — FUNGAL ORGANISM REFLEX

## 2021-03-28 LAB — FUNGUS CULTURE RESULT

## 2021-03-29 ENCOUNTER — Ambulatory Visit: Payer: 59 | Admitting: Infectious Disease

## 2021-04-03 ENCOUNTER — Encounter: Payer: Self-pay | Admitting: Infectious Disease

## 2021-04-04 ENCOUNTER — Telehealth: Payer: Self-pay

## 2021-04-04 NOTE — Telephone Encounter (Signed)
Received call from [facility], pharmacist at [facility] requesting pull PICC orders. Scheduled end date is 04/08/21 and follow up on 04/16/21. Will route to provider.    [facility] P: [phone] F: [phone]  Sandie Ano, RN

## 2021-04-05 NOTE — Telephone Encounter (Signed)
I spoke with Dwayne Rocha with [facility] and verbal orders given to pull picc after last dose. Kim verbalized understanding.  Dwayne Rocha T Pricilla Loveless

## 2021-04-10 ENCOUNTER — Other Ambulatory Visit (HOSPITAL_COMMUNITY): Payer: Self-pay | Admitting: Orthopedic Surgery

## 2021-04-10 DIAGNOSIS — M869 Osteomyelitis, unspecified: Secondary | ICD-10-CM

## 2021-04-10 NOTE — Telephone Encounter (Signed)
Spoke with pharmacist at [facility], confirmed they had the pull picc orders from last week and had relayed these to the home health agency (appears to be [facility]). After looking further in the chart, it appears to actually be a tunneled catheter instead of PICC.  Gave verbal order to [facility] for continued catheter care nursing and supplies until it is removed by IR.  Please cosign order for IR to remove the tunneled catheter.  Andree Coss, RN

## 2021-04-11 ENCOUNTER — Ambulatory Visit: Payer: 59 | Admitting: Internal Medicine

## 2021-04-16 ENCOUNTER — Ambulatory Visit: Payer: 59 | Admitting: Internal Medicine

## 2021-04-17 ENCOUNTER — Other Ambulatory Visit (HOSPITAL_COMMUNITY)
Admission: RE | Admit: 2021-04-17 | Discharge: 2021-04-17 | Disposition: A | Discharge status: Home / Self Care | Payer: 59 | Source: Ambulatory Visit | Attending: Orthopedic Surgery | Admitting: Orthopedic Surgery

## 2021-04-17 ENCOUNTER — Encounter (HOSPITAL_COMMUNITY): Payer: Self-pay | Admitting: Orthopedic Surgery

## 2021-04-17 DIAGNOSIS — Z20822 Contact with and (suspected) exposure to covid-19: Secondary | ICD-10-CM | POA: Insufficient documentation

## 2021-04-17 DIAGNOSIS — Z01812 Encounter for preprocedural laboratory examination: Secondary | ICD-10-CM | POA: Insufficient documentation

## 2021-04-17 LAB — SARS CORONAVIRUS 2 (TAT 6-24 HRS): SARS Coronavirus 2: NEGATIVE

## 2021-04-17 NOTE — Progress Notes (Signed)
   04/17/21 1709  OBSTRUCTIVE SLEEP APNEA  Have you ever been diagnosed with sleep apnea through a sleep study? No  Do you snore loudly (loud enough to be heard through closed doors)?  1  Do you often feel tired, fatigued, or sleepy during the daytime (such as falling asleep during driving or talking to someone)? 1  Has anyone observed you stop breathing during your sleep? 1  Do you have, or are you being treated for high blood pressure? 1  BMI more than 35 kg/m2? 1  Age > 50 (1-yes) 0  Male Gender (Yes=1) 1  Obstructive Sleep Apnea Score 6

## 2021-04-17 NOTE — Progress Notes (Addendum)
Mr. Square denies chest pain or shortness of breath.  Mr. Bejar states that he did a home Covid test and it was positive on 03/28/21, patient states he reported it to Dr. Laverta [place] office. Mr. Lewallen reports that he had a cough and running nose, when the test was positive, he has no symptoms now.  Mr. Penson was tested this am.  Mr. Cartmell has type II diabetes, patient has a Omni pod insulin pump, and a Libre free style. Patient reports that CBGs run between 80 - 130. Insulin  pump was ordered by PCP- Dr Bonna Gains, patient was not given instructions on what to do if he is NPO.  I instructed patient to decrease Basal rate by 20% at midnight Wednesday. Mr. Iannone reports that basal rate is 1 unit an hour, he will decrease it to 0.8 units.  I instructed patient to check CBG after awaking and every 2 hours until arrival  to the hospital.  I Instructed patient if CBG is less than 70 to drink  1/2 cup of a clear juice. Recheck CBG in 15 minutes if CBG is not over 70, patient said he was suspend Insulin through Omi Pod.  I instructed patient what medications to take on Thursday am. Mr. Fullbright said he was not taking any medications, because he does not take medications that early in the am, I did get Mr. Cordts to take Metoprolol.

## 2021-04-18 ENCOUNTER — Telehealth (HOSPITAL_COMMUNITY): Payer: Self-pay

## 2021-04-18 ENCOUNTER — Encounter (HOSPITAL_COMMUNITY): Payer: Self-pay | Admitting: Orthopedic Surgery

## 2021-04-18 NOTE — Telephone Encounter (Signed)
Called to schedule cath removal, pt was on the other line with anesthesia. Told him to call back. AW
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
## 2021-04-19 ENCOUNTER — Encounter (HOSPITAL_COMMUNITY): Payer: Self-pay | Admitting: Orthopedic Surgery

## 2021-04-19 ENCOUNTER — Inpatient Hospital Stay (HOSPITAL_COMMUNITY)
Admission: RE | Admit: 2021-04-19 | Discharge: 2021-04-27 | DRG: 041 | Disposition: A | Discharge status: Home Health | Payer: 59 | Attending: Orthopedic Surgery | Admitting: Orthopedic Surgery

## 2021-04-19 ENCOUNTER — Inpatient Hospital Stay (HOSPITAL_COMMUNITY): Payer: 59 | Admitting: Vascular Surgery

## 2021-04-19 ENCOUNTER — Other Ambulatory Visit: Payer: Self-pay

## 2021-04-19 ENCOUNTER — Encounter (HOSPITAL_COMMUNITY)
Admission: RE | Disposition: A | Discharge status: Home / Self Care | Payer: Self-pay | Source: Home / Self Care | Attending: Orthopedic Surgery

## 2021-04-19 DIAGNOSIS — Z89411 Acquired absence of right great toe: Secondary | ICD-10-CM | POA: Diagnosis not present

## 2021-04-19 DIAGNOSIS — K219 Gastro-esophageal reflux disease without esophagitis: Secondary | ICD-10-CM | POA: Diagnosis present

## 2021-04-19 DIAGNOSIS — M14672 Charcot's joint, left ankle and foot: Secondary | ICD-10-CM | POA: Diagnosis present

## 2021-04-19 DIAGNOSIS — Z9641 Presence of insulin pump (external) (internal): Secondary | ICD-10-CM | POA: Diagnosis present

## 2021-04-19 DIAGNOSIS — Z6841 Body Mass Index (BMI) 40.0 and over, adult: Secondary | ICD-10-CM | POA: Diagnosis not present

## 2021-04-19 DIAGNOSIS — Z87891 Personal history of nicotine dependence: Secondary | ICD-10-CM

## 2021-04-19 DIAGNOSIS — Z981 Arthrodesis status: Secondary | ICD-10-CM

## 2021-04-19 DIAGNOSIS — Z888 Allergy status to other drugs, medicaments and biological substances status: Secondary | ICD-10-CM

## 2021-04-19 DIAGNOSIS — Z79899 Other long term (current) drug therapy: Secondary | ICD-10-CM

## 2021-04-19 DIAGNOSIS — N1831 Chronic kidney disease, stage 3a: Secondary | ICD-10-CM | POA: Diagnosis present

## 2021-04-19 DIAGNOSIS — Z833 Family history of diabetes mellitus: Secondary | ICD-10-CM

## 2021-04-19 DIAGNOSIS — E1122 Type 2 diabetes mellitus with diabetic chronic kidney disease: Secondary | ICD-10-CM | POA: Diagnosis present

## 2021-04-19 DIAGNOSIS — Z20822 Contact with and (suspected) exposure to covid-19: Secondary | ICD-10-CM | POA: Diagnosis present

## 2021-04-19 DIAGNOSIS — G4733 Obstructive sleep apnea (adult) (pediatric): Secondary | ICD-10-CM | POA: Diagnosis present

## 2021-04-19 DIAGNOSIS — E11610 Type 2 diabetes mellitus with diabetic neuropathic arthropathy: Secondary | ICD-10-CM | POA: Diagnosis present

## 2021-04-19 DIAGNOSIS — G4730 Sleep apnea, unspecified: Secondary | ICD-10-CM | POA: Diagnosis present

## 2021-04-19 DIAGNOSIS — R310 Gross hematuria: Secondary | ICD-10-CM | POA: Diagnosis present

## 2021-04-19 DIAGNOSIS — E1140 Type 2 diabetes mellitus with diabetic neuropathy, unspecified: Secondary | ICD-10-CM | POA: Diagnosis present

## 2021-04-19 DIAGNOSIS — I10 Essential (primary) hypertension: Secondary | ICD-10-CM | POA: Diagnosis not present

## 2021-04-19 DIAGNOSIS — L97529 Non-pressure chronic ulcer of other part of left foot with unspecified severity: Secondary | ICD-10-CM | POA: Diagnosis present

## 2021-04-19 DIAGNOSIS — E785 Hyperlipidemia, unspecified: Secondary | ICD-10-CM | POA: Diagnosis present

## 2021-04-19 DIAGNOSIS — E1165 Type 2 diabetes mellitus with hyperglycemia: Secondary | ICD-10-CM | POA: Diagnosis present

## 2021-04-19 DIAGNOSIS — E11621 Type 2 diabetes mellitus with foot ulcer: Secondary | ICD-10-CM | POA: Diagnosis present

## 2021-04-19 DIAGNOSIS — Z7902 Long term (current) use of antithrombotics/antiplatelets: Secondary | ICD-10-CM

## 2021-04-19 DIAGNOSIS — Z8249 Family history of ischemic heart disease and other diseases of the circulatory system: Secondary | ICD-10-CM | POA: Diagnosis not present

## 2021-04-19 DIAGNOSIS — I129 Hypertensive chronic kidney disease with stage 1 through stage 4 chronic kidney disease, or unspecified chronic kidney disease: Secondary | ICD-10-CM | POA: Diagnosis present

## 2021-04-19 DIAGNOSIS — N179 Acute kidney failure, unspecified: Secondary | ICD-10-CM

## 2021-04-19 DIAGNOSIS — S88112A Complete traumatic amputation at level between knee and ankle, left lower leg, initial encounter: Secondary | ICD-10-CM | POA: Diagnosis not present

## 2021-04-19 DIAGNOSIS — Z794 Long term (current) use of insulin: Secondary | ICD-10-CM

## 2021-04-19 HISTORY — PX: AMPUTATION: SHX166

## 2021-04-19 HISTORY — DX: Unspecified injury of head, initial encounter: S09.90XA

## 2021-04-19 HISTORY — DX: Gastro-esophageal reflux disease without esophagitis: K21.9

## 2021-04-19 HISTORY — DX: Personal history of other medical treatment: Z92.89

## 2021-04-19 HISTORY — DX: Other injury of unspecified body region, initial encounter: T14.8XXA

## 2021-04-19 LAB — TYPE AND SCREEN
ABO/RH(D): O POS
Antibody Screen: NEGATIVE

## 2021-04-19 LAB — BASIC METABOLIC PANEL WITH GFR
Anion gap: 8 (ref 5–15)
BUN: 17 mg/dL (ref 6–20)
CO2: 24 mmol/L (ref 22–32)
Calcium: 8 mg/dL — ABNORMAL LOW (ref 8.9–10.3)
Chloride: 107 mmol/L (ref 98–111)
Creatinine, Ser: 1.68 mg/dL — ABNORMAL HIGH (ref 0.61–1.24)
GFR, Estimated: 51 mL/min — ABNORMAL LOW
Glucose, Bld: 146 mg/dL — ABNORMAL HIGH (ref 70–99)
Potassium: 3.7 mmol/L (ref 3.5–5.1)
Sodium: 139 mmol/L (ref 135–145)

## 2021-04-19 LAB — CBC
HCT: 33.2 % — ABNORMAL LOW (ref 39.0–52.0)
Hemoglobin: 10.4 g/dL — ABNORMAL LOW (ref 13.0–17.0)
MCH: 28.1 pg (ref 26.0–34.0)
MCHC: 31.3 g/dL (ref 30.0–36.0)
MCV: 89.7 fL (ref 80.0–100.0)
Platelets: 222 K/uL (ref 150–400)
RBC: 3.7 MIL/uL — ABNORMAL LOW (ref 4.22–5.81)
RDW: 15 % (ref 11.5–15.5)
WBC: 6.6 K/uL (ref 4.0–10.5)
nRBC: 0 % (ref 0.0–0.2)

## 2021-04-19 LAB — GLUCOSE, CAPILLARY
Glucose-Capillary: 150 mg/dL — ABNORMAL HIGH (ref 70–99)
Glucose-Capillary: 164 mg/dL — ABNORMAL HIGH (ref 70–99)
Glucose-Capillary: 129 mg/dL — ABNORMAL HIGH (ref 70–99)

## 2021-04-19 SURGERY — AMPUTATION BELOW KNEE
Anesthesia: Monitor Anesthesia Care | Site: Knee | Laterality: Left

## 2021-04-19 MED ORDER — FENTANYL CITRATE (PF) 100 MCG/2ML IJ SOLN
INTRAMUSCULAR | Status: AC
  Administered 2021-04-19: 100 ug via INTRAVENOUS
  Filled 2021-04-19: qty 2

## 2021-04-19 MED ORDER — PROPOFOL 500 MG/50ML IV EMUL
INTRAVENOUS | Status: DC | PRN
  Administered 2021-04-19: 75 ug/kg/min via INTRAVENOUS

## 2021-04-19 MED ORDER — PROPOFOL 10 MG/ML IV BOLUS
INTRAVENOUS | Status: AC
  Filled 2021-04-19: qty 20

## 2021-04-19 MED ORDER — NITROGLYCERIN 0.4 MG SL SUBL: 0.4000 mg | SUBLINGUAL_TABLET | SUBLINGUAL | Status: DC | PRN

## 2021-04-19 MED ORDER — ALUM & MAG HYDROXIDE-SIMETH 200-200-20 MG/5ML PO SUSP: 15.0000 mL | ORAL | Status: DC | PRN

## 2021-04-19 MED ORDER — ACETAMINOPHEN 325 MG PO TABS
325.0000 mg | ORAL_TABLET | Freq: Four times a day (QID) | ORAL | Status: DC | PRN
  Administered 2021-04-24: 650 mg via ORAL
  Filled 2021-04-19: qty 2

## 2021-04-19 MED ORDER — CEFAZOLIN IN SODIUM CHLORIDE 3-0.9 GM/100ML-% IV SOLN
INTRAVENOUS | Status: AC
  Filled 2021-04-19: qty 100

## 2021-04-19 MED ORDER — MAGNESIUM SULFATE 2 GM/50ML IV SOLN
2.0000 g | Freq: Every day | INTRAVENOUS | Status: DC | PRN
Start: 2021-04-19 — End: 2021-04-27
  Filled 2021-04-19: qty 50

## 2021-04-19 MED ORDER — ORAL CARE MOUTH RINSE: 15.0000 mL | Freq: Once | OROMUCOSAL | Status: AC

## 2021-04-19 MED ORDER — HYDRALAZINE HCL 20 MG/ML IJ SOLN: 5.0000 mg | INTRAMUSCULAR | Status: DC | PRN

## 2021-04-19 MED ORDER — SODIUM CHLORIDE 0.9 % IV SOLN: INTRAVENOUS | Status: DC

## 2021-04-19 MED ORDER — ONDANSETRON HCL 4 MG/2ML IJ SOLN
4.0000 mg | Freq: Four times a day (QID) | INTRAMUSCULAR | Status: DC | PRN
  Administered 2021-04-22: 4 mg via INTRAVENOUS
  Filled 2021-04-19: qty 2

## 2021-04-19 MED ORDER — PANTOPRAZOLE SODIUM 40 MG PO TBEC
40.0000 mg | DELAYED_RELEASE_TABLET | Freq: Two times a day (BID) | ORAL | Status: DC
  Administered 2021-04-19 – 2021-04-26 (×15): 40 mg via ORAL
  Filled 2021-04-19 (×14): qty 1

## 2021-04-19 MED ORDER — MIDAZOLAM HCL 2 MG/2ML IJ SOLN: 2.0000 mg | Freq: Once | INTRAMUSCULAR | Status: AC

## 2021-04-19 MED ORDER — INSULIN PUMP
Freq: Three times a day (TID) | SUBCUTANEOUS | Status: DC
  Filled 2021-04-19: qty 1

## 2021-04-19 MED ORDER — HYDRALAZINE HCL 50 MG PO TABS
100.0000 mg | ORAL_TABLET | Freq: Three times a day (TID) | ORAL | Status: DC
  Administered 2021-04-19 – 2021-04-27 (×23): 100 mg via ORAL
  Filled 2021-04-19 (×25): qty 2

## 2021-04-19 MED ORDER — CEFAZOLIN IN SODIUM CHLORIDE 3-0.9 GM/100ML-% IV SOLN
3.0000 g | INTRAVENOUS | Status: AC
  Administered 2021-04-19: 3 g via INTRAVENOUS

## 2021-04-19 MED ORDER — INSULIN ASPART 100 UNIT/ML IJ SOLN: 0.0000 [IU] | Freq: Three times a day (TID) | INTRAMUSCULAR | Status: DC

## 2021-04-19 MED ORDER — DOCUSATE SODIUM 100 MG PO CAPS: 100.0000 mg | ORAL_CAPSULE | Freq: Every day | ORAL | Status: DC

## 2021-04-19 MED ORDER — POTASSIUM CHLORIDE CRYS ER 20 MEQ PO TBCR
20.0000 meq | EXTENDED_RELEASE_TABLET | Freq: Every day | ORAL | Status: DC | PRN
Start: 2021-04-19 — End: 2021-04-27

## 2021-04-19 MED ORDER — MIDAZOLAM HCL 2 MG/2ML IJ SOLN
INTRAMUSCULAR | Status: AC
  Administered 2021-04-19: 2 mg via INTRAVENOUS
  Filled 2021-04-19: qty 2

## 2021-04-19 MED ORDER — SENNA 8.6 MG PO TABS
1.0000 | ORAL_TABLET | Freq: Two times a day (BID) | ORAL | Status: DC
  Administered 2021-04-20 – 2021-04-26 (×9): 8.6 mg via ORAL
  Filled 2021-04-19 (×12): qty 1

## 2021-04-19 MED ORDER — VANCOMYCIN HCL 1000 MG IV SOLR
INTRAVENOUS | Status: AC
  Filled 2021-04-19: qty 1000

## 2021-04-19 MED ORDER — OXYCODONE HCL 5 MG PO TABS
10.0000 mg | ORAL_TABLET | ORAL | Status: DC | PRN
  Administered 2021-04-19 – 2021-04-21 (×8): 15 mg via ORAL
  Filled 2021-04-19 (×2): qty 3
  Filled 2021-04-19: qty 2
  Filled 2021-04-19 (×5): qty 3

## 2021-04-19 MED ORDER — CHLORHEXIDINE GLUCONATE CLOTH 2 % EX PADS
6.0000 | MEDICATED_PAD | Freq: Every day | CUTANEOUS | Status: DC
  Administered 2021-04-19 – 2021-04-26 (×7): 6 via TOPICAL

## 2021-04-19 MED ORDER — ENOXAPARIN SODIUM 80 MG/0.8ML IJ SOSY
80.0000 mg | PREFILLED_SYRINGE | Freq: Every day | INTRAMUSCULAR | Status: DC
  Administered 2021-04-20 – 2021-04-26 (×7): 80 mg via SUBCUTANEOUS
  Filled 2021-04-19 (×8): qty 0.8

## 2021-04-19 MED ORDER — CHLORHEXIDINE GLUCONATE 0.12 % MT SOLN
OROMUCOSAL | Status: AC
  Filled 2021-04-19: qty 15

## 2021-04-19 MED ORDER — VANCOMYCIN HCL 500 MG IV SOLR
INTRAVENOUS | Status: AC
  Filled 2021-04-19: qty 500

## 2021-04-19 MED ORDER — METOPROLOL TARTRATE 50 MG PO TABS: 50.0000 mg | ORAL_TABLET | Freq: Two times a day (BID) | ORAL | Status: DC

## 2021-04-19 MED ORDER — AMLODIPINE BESYLATE 10 MG PO TABS
10.0000 mg | ORAL_TABLET | Freq: Every day | ORAL | Status: DC
  Administered 2021-04-20 – 2021-04-26 (×7): 10 mg via ORAL
  Filled 2021-04-19 (×7): qty 1

## 2021-04-19 MED ORDER — LISINOPRIL 20 MG PO TABS
40.0000 mg | ORAL_TABLET | Freq: Every day | ORAL | Status: DC
  Administered 2021-04-19: 40 mg via ORAL
  Filled 2021-04-19: qty 2

## 2021-04-19 MED ORDER — PANTOPRAZOLE SODIUM 40 MG PO TBEC: 40.0000 mg | DELAYED_RELEASE_TABLET | Freq: Every day | ORAL | Status: DC

## 2021-04-19 MED ORDER — DOCUSATE SODIUM 100 MG PO CAPS
100.0000 mg | ORAL_CAPSULE | Freq: Two times a day (BID) | ORAL | Status: DC
  Administered 2021-04-19 – 2021-04-26 (×14): 100 mg via ORAL
  Filled 2021-04-19 (×15): qty 1

## 2021-04-19 MED ORDER — FREESTYLE LIBRE 14 DAY SENSOR MISC: Freq: Three times a day (TID) | Status: DC

## 2021-04-19 MED ORDER — FENTANYL CITRATE (PF) 250 MCG/5ML IJ SOLN
INTRAMUSCULAR | Status: AC
  Filled 2021-04-19: qty 5

## 2021-04-19 MED ORDER — PHENOL 1.4 % MT LIQD: 1.0000 | OROMUCOSAL | Status: DC | PRN

## 2021-04-19 MED ORDER — DIPHENHYDRAMINE HCL 12.5 MG/5ML PO ELIX: 12.5000 mg | ORAL_SOLUTION | ORAL | Status: DC | PRN

## 2021-04-19 MED ORDER — DEXMEDETOMIDINE (PRECEDEX) IN NS 20 MCG/5ML (4 MCG/ML) IV SYRINGE
PREFILLED_SYRINGE | INTRAVENOUS | Status: DC | PRN
  Administered 2021-04-19 (×2): 8 ug via INTRAVENOUS
  Administered 2021-04-19: 4 ug via INTRAVENOUS

## 2021-04-19 MED ORDER — LACTATED RINGERS IV SOLN: INTRAVENOUS | Status: DC | PRN

## 2021-04-19 MED ORDER — 0.9 % SODIUM CHLORIDE (POUR BTL) OPTIME
TOPICAL | Status: DC | PRN
  Administered 2021-04-19 (×2): 1000 mL

## 2021-04-19 MED ORDER — CHLORHEXIDINE GLUCONATE 0.12 % MT SOLN
15.0000 mL | Freq: Once | OROMUCOSAL | Status: AC
  Administered 2021-04-19: 15 mL via OROMUCOSAL

## 2021-04-19 MED ORDER — METOPROLOL TARTRATE 50 MG PO TABS
100.0000 mg | ORAL_TABLET | Freq: Two times a day (BID) | ORAL | Status: DC
  Administered 2021-04-19 – 2021-04-26 (×15): 100 mg via ORAL
  Filled 2021-04-19 (×15): qty 2

## 2021-04-19 MED ORDER — LACTATED RINGERS IV SOLN: INTRAVENOUS | Status: DC

## 2021-04-19 MED ORDER — VANCOMYCIN HCL 500 MG IV SOLR
INTRAVENOUS | Status: DC | PRN
  Administered 2021-04-19: 500 mg via TOPICAL

## 2021-04-19 MED ORDER — GUAIFENESIN-DM 100-10 MG/5ML PO SYRP: 15.0000 mL | ORAL_SOLUTION | ORAL | Status: DC | PRN

## 2021-04-19 MED ORDER — ONDANSETRON HCL 4 MG/2ML IJ SOLN: 4.0000 mg | Freq: Four times a day (QID) | INTRAMUSCULAR | Status: DC | PRN

## 2021-04-19 MED ORDER — PHENYLEPHRINE 40 MCG/ML (10ML) SYRINGE FOR IV PUSH (FOR BLOOD PRESSURE SUPPORT)
PREFILLED_SYRINGE | INTRAVENOUS | Status: DC | PRN
  Administered 2021-04-19: 80 ug via INTRAVENOUS
  Administered 2021-04-19: 160 ug via INTRAVENOUS
  Administered 2021-04-19: 80 ug via INTRAVENOUS

## 2021-04-19 MED ORDER — SODIUM CHLORIDE 0.9% FLUSH: 10.0000 mL | INTRAVENOUS | Status: DC | PRN

## 2021-04-19 MED ORDER — METOPROLOL TARTRATE 5 MG/5ML IV SOLN
2.0000 mg | INTRAVENOUS | Status: DC | PRN
Start: 2021-04-19 — End: 2021-04-27

## 2021-04-19 MED ORDER — IRBESARTAN 75 MG PO TABS
37.5000 mg | ORAL_TABLET | Freq: Every day | ORAL | Status: DC
  Filled 2021-04-19: qty 0.5

## 2021-04-19 MED ORDER — HYDROMORPHONE HCL 1 MG/ML IJ SOLN
0.5000 mg | INTRAMUSCULAR | Status: DC | PRN
  Administered 2021-04-19 – 2021-04-23 (×7): 1 mg via INTRAVENOUS
  Administered 2021-04-24 (×2): 0.5 mg via INTRAVENOUS
  Filled 2021-04-19 (×9): qty 1

## 2021-04-19 MED ORDER — ONDANSETRON HCL 4 MG PO TABS: 4.0000 mg | ORAL_TABLET | Freq: Four times a day (QID) | ORAL | Status: DC | PRN

## 2021-04-19 MED ORDER — DOXYCYCLINE HYCLATE 100 MG PO TABS
100.0000 mg | ORAL_TABLET | Freq: Two times a day (BID) | ORAL | Status: DC
  Administered 2021-04-19 – 2021-04-23 (×9): 100 mg via ORAL
  Filled 2021-04-19 (×10): qty 1

## 2021-04-19 MED ORDER — LABETALOL HCL 5 MG/ML IV SOLN
10.0000 mg | INTRAVENOUS | Status: DC | PRN
Start: 2021-04-19 — End: 2021-04-27

## 2021-04-19 MED ORDER — FENTANYL CITRATE (PF) 100 MCG/2ML IJ SOLN
100.0000 ug | Freq: Once | INTRAMUSCULAR | Status: AC
Start: 2021-04-19 — End: 2021-04-19

## 2021-04-19 MED ORDER — OXYCODONE HCL 5 MG PO TABS
5.0000 mg | ORAL_TABLET | ORAL | Status: DC | PRN
  Administered 2021-04-21 – 2021-04-23 (×4): 5 mg via ORAL
  Filled 2021-04-19 (×2): qty 1
  Filled 2021-04-19: qty 2
  Filled 2021-04-19 (×3): qty 1

## 2021-04-19 SURGICAL SUPPLY — 55 items
APL PRP STRL LF DISP 70% ISPRP (MISCELLANEOUS) ×1
BANDAGE ESMARK 6X9 LF (GAUZE/BANDAGES/DRESSINGS) IMPLANT
BLADE SAW RECIP 87.9 MT (BLADE) ×3 IMPLANT
BNDG CMPR MED 15X6 ELC VLCR LF (GAUZE/BANDAGES/DRESSINGS) ×1
BNDG COHESIVE 6X5 TAN STRL LF (GAUZE/BANDAGES/DRESSINGS) ×3 IMPLANT
BNDG ELASTIC 6X15 VLCR STRL LF (GAUZE/BANDAGES/DRESSINGS) ×3 IMPLANT
BNDG ELASTIC 6X5.8 VLCR STR LF (GAUZE/BANDAGES/DRESSINGS) IMPLANT
BNDG ESMARK 6X9 LF (GAUZE/BANDAGES/DRESSINGS)
CHLORAPREP W/TINT 26 (MISCELLANEOUS) ×3 IMPLANT
COVER SURGICAL LIGHT HANDLE (MISCELLANEOUS) ×3 IMPLANT
COVER WAND RF STERILE (DRAPES) ×3 IMPLANT
CUFF TOURN SGL QUICK 34 (TOURNIQUET CUFF) ×3
CUFF TOURN SGL QUICK 42 (TOURNIQUET CUFF) IMPLANT
CUFF TRNQT CYL 34X4.125X (TOURNIQUET CUFF) ×1 IMPLANT
DRAPE INCISE IOBAN 66X45 STRL (DRAPES) ×3 IMPLANT
DRSG MEPITEL 4X7.2 (GAUZE/BANDAGES/DRESSINGS) ×3 IMPLANT
DRSG PAD ABDOMINAL 8X10 ST (GAUZE/BANDAGES/DRESSINGS) ×3 IMPLANT
ELECT CAUTERY BLADE 6.4 (BLADE) IMPLANT
ELECT REM PT RETURN 9FT ADLT (ELECTROSURGICAL) ×3
ELECTRODE REM PT RTRN 9FT ADLT (ELECTROSURGICAL) ×1 IMPLANT
EVACUATOR 1/8 PVC DRAIN (DRAIN) IMPLANT
GAUZE SPONGE 4X4 12PLY STRL (GAUZE/BANDAGES/DRESSINGS) ×3 IMPLANT
GLOVE BIO SURGEON STRL SZ8 (GLOVE) ×3 IMPLANT
GLOVE ECLIPSE 8.0 STRL XLNG CF (GLOVE) ×6 IMPLANT
GLOVE SRG 8 PF TXTR STRL LF DI (GLOVE) ×2 IMPLANT
GLOVE SURG UNDER POLY LF SZ8 (GLOVE) ×6
GOWN STRL REUS W/ TWL LRG LVL3 (GOWN DISPOSABLE) ×1 IMPLANT
GOWN STRL REUS W/ TWL XL LVL3 (GOWN DISPOSABLE) ×1 IMPLANT
GOWN STRL REUS W/TWL LRG LVL3 (GOWN DISPOSABLE) ×3
GOWN STRL REUS W/TWL XL LVL3 (GOWN DISPOSABLE) ×3
KIT BASIN OR (CUSTOM PROCEDURE TRAY) ×3 IMPLANT
KIT TURNOVER KIT B (KITS) ×3 IMPLANT
MANIFOLD NEPTUNE II (INSTRUMENTS) ×3 IMPLANT
NS IRRIG 1000ML POUR BTL (IV SOLUTION) ×3 IMPLANT
PACK ORTHO EXTREMITY (CUSTOM PROCEDURE TRAY) ×3 IMPLANT
PAD ARMBOARD 7.5X6 YLW CONV (MISCELLANEOUS) ×6 IMPLANT
PAD CAST 4YDX4 CTTN HI CHSV (CAST SUPPLIES) ×1 IMPLANT
PADDING CAST COTTON 4X4 STRL (CAST SUPPLIES) ×3
PADDING CAST COTTON 6X4 STRL (CAST SUPPLIES) ×3 IMPLANT
PADDING CAST SYN 6 (CAST SUPPLIES) ×2
PADDING CAST SYNTHETIC 6X4 NS (CAST SUPPLIES) ×1 IMPLANT
SPONGE LAP 18X18 RF (DISPOSABLE) IMPLANT
STAPLER VISISTAT 35W (STAPLE) IMPLANT
STOCKINETTE IMPERVIOUS LG (DRAPES) IMPLANT
SUT ETHILON 2 0 FS 18 (SUTURE) IMPLANT
SUT MNCRL AB 3-0 PS2 18 (SUTURE) ×3 IMPLANT
SUT PDS AB 1 CT  36 (SUTURE) ×6
SUT PDS AB 1 CT 36 (SUTURE) ×2 IMPLANT
SUT SILK 2 0 (SUTURE) ×3
SUT SILK 2-0 18XBRD TIE 12 (SUTURE) ×1 IMPLANT
SWAB COLLECTION DEVICE MRSA (MISCELLANEOUS) IMPLANT
SWAB CULTURE ESWAB REG 1ML (MISCELLANEOUS) IMPLANT
TOWEL GREEN STERILE (TOWEL DISPOSABLE) ×3 IMPLANT
TOWEL GREEN STERILE FF (TOWEL DISPOSABLE) ×3 IMPLANT
WATER STERILE IRR 1000ML POUR (IV SOLUTION) ×3 IMPLANT
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
## 2021-04-19 NOTE — Transfer of Care (Signed)
Immediate Anesthesia Transfer of Care Note  Patient: Dwayne Rocha  Procedure(s) Performed: AMPUTATION BELOW KNEE (Left: Knee)  Patient Location: PACU  Anesthesia Type:MAC  Level of Consciousness: awake, alert  and oriented  Airway & Oxygen Therapy: Patient Spontanous Breathing  Post-op Assessment: Report given to RN and Post -op Vital signs reviewed and stable  Post vital signs: Reviewed and stable  Last Vitals:  Vitals Value Taken Time  BP 102/82 04/19/21 1036  Temp    Pulse 87 04/19/21 1038  Resp 11 04/19/21 1038  SpO2 97 % 04/19/21 1038  Vitals shown include unvalidated device data.  Last Pain:  Vitals:   04/19/21 0925  TempSrc:   PainSc: 2       Patients Stated Pain Goal: 3 (55/37/48 2707)  Complications: No notable events documented.

## 2021-04-19 NOTE — Progress Notes (Addendum)
Inpatient Diabetes Program Recommendations  AACE/ADA: New Consensus Statement on Inpatient Glycemic Control (2015)  Target Ranges:  Prepandial:   less than 140 mg/dL      Peak postprandial:   less than 180 mg/dL (1-2 hours)      Critically ill patients:  140 - 180 mg/dL   Lab Results  Component Value Date   GLUCAP 150 (H) 04/19/2021   HGBA1C 6.8 (H) 02/15/2021    Review of Glycemic Control Results for Dwayne Rocha, Dwayne Rocha (MRN [id number]) as of 04/19/2021 09:47  Ref. Range 04/19/2021 07:17  Glucose-Capillary Latest Ref Range: 70 - 99 mg/dL 409 (H)   Diabetes history: Type 2 Dm Outpatient Diabetes medications: Per chart review: Daily basal rates: 0.8-1.0 and 1 units per 13 grams of CHO Current orders for Inpatient glycemic control: none  Inpatient Diabetes Program Recommendations:    Noted consult. Secure chat sent to [facility], CRNA to verify that Omnipod was removed prior to surgery.  Insulin pump still applied for after surgery but device not delivering insulin at this time.  Following case:  -Consider adding Novolog 0-6 units Q4H and insulin pump order set  Q4H (assuming patient is safe to operate independently). Addendum: Verified with Sarah, RN that patient is alert and oriented. Secure chat sent to Alfredo Martinez for insulin pump order set.    Current insulin pump settings are as follows:  Basal insulin  12A 0.9 units/hour Total daily basal insulin: 21.6 units/24 hours Carb Coverage 1:12 1 unit for every 13 grams of carbohydrates Insulin Sensitivity 1:45 1 unit drops blood glucose 50 mg/dl  NURSING: Once insulin pump order set is ordered please print off the Patient insulin pump contract and flow sheet. The insulin pump contract should be signed by the patient and then placed in the chart. The patient insulin pump flow sheet will be completed by the patient at the bedside and the RN caring for the patient will use the patient's flow sheet to document in the [facility]. RN will need to  complete the Nursing Insulin Pump Flowsheet at least once a shift. Patient will need to keep extra insulin pump supplies at the bedside at all times.   Thanks, Lujean Rave, MSN, RNC-OB Diabetes Coordinator [phone] (8a-5p)

## 2021-04-19 NOTE — Progress Notes (Signed)
Orthopedic Tech Progress Note Patient Details:  Dwayne Rocha Dec 01, 1975 [id number] Called order into [employer] Patient ID: Dwayne Rocha, male   DOB: October 07, 1976, 45 y.o.   MRN: [id number]  Dwayne Rocha 04/19/2021, 2:06 PM

## 2021-04-19 NOTE — Progress Notes (Signed)
Patient refused CPAP HS. Patient stated he doesn't wear CPAP at home 

## 2021-04-19 NOTE — Op Note (Signed)
04/19/2021  10:39 AM  PATIENT:  Dwayne Rocha  45 y.o. male  PRE-OPERATIVE DIAGNOSIS:  1.  left foot/ankle charcot's arthropathy      2.  Left foot diabetic ulcer  POST-OPERATIVE DIAGNOSIS:  same  Procedure(s):  left below knee amputation  SURGEON:  Wylene Simmer, MD  ASSISTANT: Mechele Claude, PA-C  ANESTHESIA:   MAC, regional  EBL:  minimal   TOURNIQUET:   Total Tourniquet Time Documented: Thigh (Left) - 36 minutes Total: Thigh (Left) - 36 minutes  COMPLICATIONS:  None apparent  DISPOSITION:  Extubated, awake and stable to recovery.  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  He underwent an attempt at tibiocalcaneal arthrodesis in April.  This was complicated by postop infection requiring I&D.  He has redislocated through the tibiocalcaneal arthrodesis site with cut out of the nail from the calcaneus.  We discussed salvage treatment options including hardware removal and application of a circular frame versus below-knee amputation.  We discussed the risks and benefits of these alternative treatment options in detail.  He elects below-knee amputation.  The risks and benefits of the alternative treatment options have been discussed in detail.  The patient wishes to proceed with surgery and specifically understands risks of bleeding, infection, nerve damage, blood clots, need for additional surgery, amputation and death.   PROCEDURE IN DETAIL: After preoperative consent was obtained and the correct operative site was identified, the patient was brought the operating room and placed supine on the operating table.  Regional anesthesia had previously been administered.  IV sedation was administered.  A surgical timeout was taken.  The left lower extremity was prepped and draped in standard sterile fashion with a tourniquet around the thigh.  The extremity  was elevated and the tourniquet was inflated to 350 mmHg.  A posterior flap incision was marked on the skin 15 cm distal from the medial joint line.  The incision was made and dissection carried sharply down through the subcutaneous tissues through the tibial periosteum.  The periosteum was elevated proximally.  The soft tissues were protected.  The reciprocating saw was used to cut through the tibia 1 cm proximal from the skin incision.  The anterior compartment musculature was then divided sharply.  The fibula was dissected in subperiosteal fashion and cut with a reciprocating saw another 1 cm proximal from the tibial cut.  The bone hook was used to retract the tibia anteriorly.  The amputation knife was used to cut through the posterior soft tissues contouring the posterior flap appropriately.  The distal portion of the leg was passed off the field to pathology.  The cut surface of bone was smoothed with a rasp.  All named vascular structures were suture ligated with 2-0 silk ties.  Named nerves were sharply transected while gently retracting.  The wound was irrigated copiously.  Vancomycin powder was sprinkled in the deep layer of the wound.  The gastrocnemius fascia was repaired to the anterior tibial periosteum with imbricating sutures of 0 PDS.  The fascia was then repaired with inverted simple sutures of 0 PDS.  Subcutaneous tissues were approximated with inverted simple sutures of 3-0 Monocryl.  The skin incision was closed with staples.  Sterile dressings were applied followed by a compression wrap.  The tourniquet was released after application of the dressings.  The patient was awakened from anesthesia and transported to the recovery room in  stable condition.   FOLLOW UP PLAN: Nonweightbearing on the left lower extremity.  Stump protector and shrinker sock to be applied this afternoon by [employer].  Lovenox for DVT prophylaxis until discharge and then aspirin 81 mg p.o. twice daily until weightbearing.   PT, OT, transition of care and hospitalist consultations postop.    Mechele Claude PA-C was present and scrubbed for the duration of the operative case. His assistance was essential in positioning the patient, prepping and draping, gaining and maintaining exposure, performing the operation, closing and dressing the wounds and applying the splint.
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
## 2021-04-19 NOTE — Plan of Care (Signed)
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
## 2021-04-19 NOTE — Discharge Instructions (Signed)
Toni Arthurs, MD [facility]  Please read the following information regarding your care after surgery.  Medications  You only need a prescription for the narcotic pain medicine (ex. oxycodone, Percocet, Norco).  All of the other medicines listed below X X Aleve 2 pills twice a day for the first 3 days after surgery. X acetominophen (Tylenol) 650 mg every 4-6 hours as you need for minor to moderate pain X oxycodone as prescribed for severe pain  Narcotic pain medicine (ex. oxycodone, Percocet, Vicodin) will cause constipation.  To prevent this problem, take the following medicines while you are taking any pain medicine. X docusate sodium (Colace) 100 mg twice a day X senna (Senokot) 2 tablets twice a day  X To help prevent blood clots, resume your Xarelto after surgery.  You should also get up every hour while you are awake to move around.    Weight Bearing X Do not bear any weight on the operated leg or foot.  Cast / Splint / Dressing X Keep your splint, cast or dressing clean and dry.  Don't put anything (coat hanger, pencil, etc) down inside of it.  If it gets damp, use a hair dryer on the cool setting to dry it.  If it gets soaked, call the office to schedule an appointment for a cast change.   After your dressing, cast or splint is removed; you may shower, but do not soak or scrub the wound.  Allow the water to run over it, and then gently pat it dry.  Swelling It is normal for you to have swelling where you had surgery.  To reduce swelling and pain, keep your toes above your nose for at least 3 days after surgery.  It may be necessary to keep your foot or leg elevated for several weeks.  If it hurts, it should be elevated.  Follow Up Call my office at [phone] when you are discharged from the hospital or surgery center to schedule an appointment to be seen two weeks after surgery.  Call my office at [phone] if you develop a fever >101.5 F, nausea, vomiting, bleeding from  the surgical site or severe pain.

## 2021-04-19 NOTE — Anesthesia Procedure Notes (Signed)
Procedure Name: MAC Date/Time: 04/19/2021 9:43 AM Performed by: Dorthea Cove, CRNA Pre-anesthesia Checklist: Emergency Drugs available, Suction available, Patient being monitored, Timeout performed and Patient identified Patient Re-evaluated:Patient Re-evaluated prior to induction Oxygen Delivery Method: Nasal cannula Preoxygenation: Pre-oxygenation with 100% oxygen Induction Type: IV induction Placement Confirmation: CO2 detector Dental Injury: Teeth and Oropharynx as per pre-operative assessment

## 2021-04-20 ENCOUNTER — Encounter (HOSPITAL_COMMUNITY): Payer: Self-pay | Admitting: Orthopedic Surgery

## 2021-04-20 ENCOUNTER — Inpatient Hospital Stay (HOSPITAL_COMMUNITY): Payer: 59

## 2021-04-20 DIAGNOSIS — N1831 Chronic kidney disease, stage 3a: Secondary | ICD-10-CM

## 2021-04-20 DIAGNOSIS — I10 Essential (primary) hypertension: Secondary | ICD-10-CM

## 2021-04-20 DIAGNOSIS — Z6841 Body Mass Index (BMI) 40.0 and over, adult: Secondary | ICD-10-CM

## 2021-04-20 HISTORY — PX: IR REMOVAL TUN CV CATH W/O FL: IMG2289

## 2021-04-20 LAB — BASIC METABOLIC PANEL
Anion gap: 8 (ref 5–15)
BUN: 18 mg/dL (ref 6–20)
CO2: 23 mmol/L (ref 22–32)
Calcium: 8 mg/dL — ABNORMAL LOW (ref 8.9–10.3)
Chloride: 104 mmol/L (ref 98–111)
Creatinine, Ser: 2.49 mg/dL — ABNORMAL HIGH (ref 0.61–1.24)
GFR, Estimated: 32 mL/min — ABNORMAL LOW (ref >60)
Glucose, Bld: 153 mg/dL — ABNORMAL HIGH (ref 70–99)
Potassium: 4.3 mmol/L (ref 3.5–5.1)
Sodium: 135 mmol/L (ref 135–145)

## 2021-04-20 LAB — CBC
HCT: 29.8 % — ABNORMAL LOW (ref 39.0–52.0)
Hemoglobin: 9.3 g/dL — ABNORMAL LOW (ref 13.0–17.0)
MCH: 28.6 pg (ref 26.0–34.0)
MCHC: 31.2 g/dL (ref 30.0–36.0)
MCV: 91.7 fL (ref 80.0–100.0)
Platelets: 208 10*3/uL (ref 150–400)
RBC: 3.25 MIL/uL — ABNORMAL LOW (ref 4.22–5.81)
RDW: 15.2 % (ref 11.5–15.5)
WBC: 6.9 10*3/uL (ref 4.0–10.5)
nRBC: 0 % (ref 0.0–0.2)

## 2021-04-20 LAB — HEMOGLOBIN A1C
Hgb A1c MFr Bld: 6.6 % — ABNORMAL HIGH (ref 4.8–5.6)
Mean Plasma Glucose: 143 mg/dL

## 2021-04-20 LAB — GLUCOSE, CAPILLARY: Glucose-Capillary: 144 mg/dL — ABNORMAL HIGH (ref 70–99)

## 2021-04-20 MED ORDER — OXYCODONE HCL 5 MG PO TABS: 5.0000 mg | ORAL_TABLET | ORAL | 0 refills | Status: DC | PRN

## 2021-04-20 MED ORDER — DOCUSATE SODIUM 100 MG PO CAPS: 100.0000 mg | ORAL_CAPSULE | Freq: Two times a day (BID) | ORAL | 0 refills | Status: DC

## 2021-04-20 MED ORDER — SENNA 8.6 MG PO TABS: 2.0000 | ORAL_TABLET | Freq: Two times a day (BID) | ORAL | 0 refills | Status: DC

## 2021-04-20 NOTE — Progress Notes (Signed)
Subjective: 1 Day Post-Op Procedure(s) (LRB): AMPUTATION BELOW KNEE (Left)  Patient reports pain as mild to moderate.  Denies fever, chills, N/V, CP, SOB.  Tolerating POs well.  Resting comfortably in bed.  Objective:   VITALS:  Temp:  [98 F (36.7 C)-99.5 F (37.5 C)] 99.2 F (37.3 C) (06/10 0443) Pulse Rate:  [75-94] 86 (06/10 0443) Resp:  [15-22] 16 (06/10 0443) BP: (102-172)/(74-97) 138/82 (06/10 0443) SpO2:  [94 %-100 %] 97 % (06/10 0443)  General: WDWN patient in NAD. Psych:  Appropriate mood and affect. Neuro:  A&O x 3, Moving all extremities, sensation intact to light touch HEENT:  EOMs intact Chest:  Even non-labored respirations Skin:  Stump protector and dressing C/D/I, no rashes or lesions Extremities: warm/dry, no visible edema, erythema or echymosis.  No lymphadenopathy. Pulses: Femoral 2+ MSK:  ROM: TKE, MMT: able to perform quad set   LABS Recent Labs    04/19/21 0751 04/20/21 0407  HGB 10.4* 9.3*  WBC 6.6 6.9  PLT 222 208   Recent Labs    04/19/21 0751 04/20/21 0407  NA 139 135  K 3.7 4.3  CL 107 104  CO2 24 23  BUN 17 18  CREATININE 1.68* 2.49*  GLUCOSE 146* 153*   No results for input(s): LABPT, INR in the last 72 hours.   Assessment/Plan: 1 Day Post-Op Procedure(s) (LRB): AMPUTATION BELOW KNEE (Left)  NWB L LE Stump protector on at all times Up with therapy DVT ppx:  Lovenox in house; resume Xarelto upon D/C D/C scripts on chart.   Prior to prescribing the oxycodone I reviewed the patient's narcotic medical record in the PMP Aware database. Disp:  Likely SNF Plan for 2 week outpatient post-op visit.  Alfredo Martinez PA-C [facility] Office:  [phone]

## 2021-04-20 NOTE — Progress Notes (Signed)
Patient refused CPAP HS. Patient stated he doesn't wear CPAP at home

## 2021-04-20 NOTE — Progress Notes (Addendum)
Inpatient Diabetes Program Recommendations  AACE/ADA: New Consensus Statement on Inpatient Glycemic Control   Target Ranges:  Prepandial:   less than 140 mg/dL      Peak postprandial:   less than 180 mg/dL (1-2 hours)      Critically ill patients:  140 - 180 mg/dL   Results for Dwayne Rocha, Dwayne Rocha (MRN [id number]) as of 04/20/2021 08:11  Ref. Range 04/19/2021 07:17 04/19/2021 10:35 04/19/2021 20:22 04/20/2021 06:27  Glucose-Capillary Latest Ref Range: 70 - 99 mg/dL 299 (H) 371 (H) 696 (H) 144 (H)  Results for Dwayne Rocha, Dwayne Rocha (MRN [id number]) as of 04/20/2021 08:11  Ref. Range 02/15/2021 05:38 04/19/2021 07:51  Hemoglobin A1C Latest Ref Range: 4.8 - 5.6 % 6.8 (H) 6.6 (H)    Review of Glycemic Control  Diabetes history: DM2 Outpatient Diabetes medications: Daily basal rates: 0.9, 1 units per 13 grams of CHO, 1 unit drops glucose 45 mg/dl Current orders for Inpatient glycemic control: Insulin Pump ACHS&2am, Novolog 0-20 units TID with meals  Inpatient Diabetes Program Recommendations:    Insulin: Please consider discontinuing Novolog correction scale as patient is using insulin pump for all insulin needs.  NOTE: Per chart current insulin pump settings are as follows:   Basal insulin 12A      0.9 units/hour Total daily basal insulin: 21.6 units/24 hours Carb Coverage 1:12     1 unit for every 13 grams of carbohydrates Insulin Sensitivity 1:45     1 unit drops blood glucose 45 mg/dl  Thanks, Orlando Penner, RN, MSN, CDE Diabetes Coordinator Inpatient Diabetes Program [phone] (Team Pager from 8am to 5pm)

## 2021-04-20 NOTE — Procedures (Signed)
Pre procedural Dx: ESRD Post procedural Dx: Same  Successful removal of tunneled central catheter. Catheter removed intact   EBL: None No immediate complications.  Please see imaging section of Epic for full dictation.  Alene Mires NP 04/20/2021 3:56 PM

## 2021-04-20 NOTE — Evaluation (Signed)
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  He underwent attempt at tibio calcaneal arthrodesis in April.  His post op course was complicated by infection and hardware failure with recurrent dislocation of the calcaneus. 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  Pt with other significant PMH of CKD, DM, head injury, HTN, neck surgery ,hip surgery, elbow surgery.  Clinical Impression  Pt was able to stand and pivot to the recliner chair with min guard assist and RW.  The RW makes him nervous and he would like to try standard walker next session (wide or bari) like he has at home.  He will likely need WC for household distances.  PT will continue to follow acutely for safe mobility progression.   PT to follow acutely for deficits listed below.       Follow Up Recommendations Home health PT    Equipment Recommendations  Wheelchair (measurements PT);Wheelchair cushion (measurements PT) (20x20)    Recommendations for Other Services       Precautions / Restrictions Precautions Precautions: Fall Precaution Comments: limb gaurd L LE Restrictions Weight Bearing Restrictions: Yes LUE Weight Bearing: Non weight bearing      Mobility  Bed Mobility Overal bed mobility: Modified Independent                  Transfers Overall transfer level: Needs assistance Equipment used: Rolling walker (2 wheeled) Transfers: Sit to/from [employer] Sit to Stand: Min guard Stand pivot transfers: Min guard       General transfer comment: Min guard assist for safety and balance during transfers.  He states that the wheels on the walker make him nervous and he would prefer a a standard walker like he has at home.  Ambulation/Gait                Stairs            Wheelchair Mobility    Modified Rankin  (Stroke Patients Only)       Balance Overall balance assessment: Needs assistance Sitting-balance support: Feet supported;No upper extremity supported Sitting balance-[name] Scale: Good     Standing balance support: Bilateral upper extremity supported Standing balance-[name] Scale: Poor Standing balance comment: needs UE support for balance in standing.                             Pertinent Vitals/Pain Pain Assessment: Faces Faces Pain Scale: Hurts even more Pain Location: L LE Pain Descriptors / Indicators: Sore;Discomfort Pain Intervention(s): Limited activity within patient's tolerance;Monitored during session;Repositioned    Home Living Family/patient expects to be discharged to:: Private residence Living Arrangements: Spouse/significant other;Children Available Help at Discharge: Family Type of Home: Apartment Home Access: Level entry     Home Layout: One level Home Equipment: Environmental consultant - 2 wheels;Crutches;Tub bench;Bedside commode;Other (comment);Adaptive equipment (knee walker)      Prior Function Level of Independence: Independent with assistive device(s)         Comments: states that up unitl recently he was Ind with ADLs/selfcare, but currently needs some assist from his wife with LB ADLs and used knee walker for mobility     Hand Dominance   Dominant Hand: Right    Extremity/Trunk Assessment   Upper Extremity Assessment Upper Extremity Assessment: Defer to OT evaluation  Lower Extremity Assessment Lower Extremity Assessment: LLE deficits/detail LLE Deficits / Details: left leg with good extension, reviewed donning limb guard, strength at least 3/5 at hip, knee NT due to immobilized in limb guard.    Cervical / Trunk Assessment Cervical / Trunk Assessment: Normal  Communication   Communication: No difficulties  Cognition Arousal/Alertness: Awake/alert Behavior During Therapy: WFL for tasks assessed/performed Overall Cognitive Status:  Within Functional Limits for tasks assessed                                        General Comments General comments (skin integrity, edema, etc.): Educated re donning/doffing limb protector, slef massage for phantom limb pain.    Exercises     Assessment/Plan    PT Assessment Patient needs continued PT services  PT Problem List Decreased strength;Decreased range of motion;Decreased activity tolerance;Decreased balance;Decreased mobility;Decreased knowledge of use of DME;Decreased knowledge of precautions;Obesity;Pain       PT Treatment Interventions DME instruction;Gait training;Functional mobility training;Therapeutic activities;Therapeutic exercise;Balance training;Patient/family education;Wheelchair mobility training    PT Goals (Current goals can be found in the Care Plan section)  Acute Rehab PT Goals Patient Stated Goal: go home PT Goal Formulation: With patient Time For Goal Achievement: 05/04/21 Potential to Achieve Goals: Good    Frequency Min 5X/week   Barriers to discharge        Co-evaluation               AM-PAC PT "6 Clicks" Mobility  Outcome Measure Help needed turning from your back to your side while in a flat bed without using bedrails?: A Little Help needed moving from lying on your back to sitting on the side of a flat bed without using bedrails?: A Little Help needed moving to and from a bed to a chair (including a wheelchair)?: A Little Help needed standing up from a chair using your arms (e.g., wheelchair or bedside chair)?: A Little Help needed to walk in hospital room?: Total Help needed climbing 3-5 steps with a railing? : Total 6 Click Score: 14    End of Session   Activity Tolerance: Patient limited by pain Patient left: in chair Nurse Communication: Mobility status PT Visit Diagnosis: Muscle weakness (generalized) (M62.81);Difficulty in walking, not elsewhere classified (R26.2);Pain Pain - Right/Left: Left Pain -  part of body: Leg    Time: 1759-1818 PT Time Calculation (min) (ACUTE ONLY): 19 min   Charges:   PT Evaluation $PT Eval Moderate Complexity: 1 Mod          Corinna Capra, PT, DPT  Acute Rehabilitation Ortho Tech Supervisor [phone] pager [phone] office

## 2021-04-20 NOTE — Progress Notes (Signed)
PROGRESS NOTE    Dwayne Rocha  FBP:[id number] DOB: [birth year] DOA: 04/19/2021 PCP: Fleet Contras, MD   Brief Narrative: Dwayne Rocha is a 45 y.o. male with a history of diabetes mellitus, charcot foot/ankle deformity, hypertension, morbid obesity, CKD. Patient presented secondary to need for left BKA for charcot foot. Medicine service consulted for patient's chronic medical diagnoses.   Assessment & Plan:   Principal Problem:   Below-knee amputation of left lower extremity (HCC) Active Problems:   Essential hypertension   Uncontrolled type 2 diabetes mellitus with hyperglycemia (HCC)   Charcot's joint of ankle, left   Sleep apnea   Morbid obesity with BMI of 50.0-59.9, adult (HCC)   Stage 3a chronic kidney disease (HCC)   Left BKA Secondary to history of charcot foot which failed multiple corrective attempts. Management per orthopedic surgery.  AKI on CKD stage IIIa In setting of recent surgery. No significant hypotension noted during surgery. Patient is on lisinopril which was transitioned to irbesartan starting 6/10. Urine output appears adequate from the last 24 hours. -IV fluids -Hold ACEi/ARB -Adjust medications for renal function -Strict in/out in addition to daily weights  Primary hypertension Patient is on amlodipine, hydralazine, metoprolol tartrate, olmesartan as an outpatient. Blood pressure elevated while inpatient. -Continue amlodipine, hydralazine, metoprolol tartrate -Hold ACEi/ARB secondary to above  Diabetes mellitus, type 2 Appears to be well controlled. Hemoglobin A1C of 6.6%. Patient is on an insulin pump as an outpatient which has been resumed inpatient. Diabetes coordinator on board. -Insulin pump per patient  OSA on CPAP Documented, however patient states that he does not use CPAP at home.  Morbid obesity Body mass index is 52.3 kg/m.  DVT prophylaxis: Per primary Code Status:   Code Status: Full Code Family Communication: None at  bedside Disposition Plan: Per primary   Consultants:  General medicine  Procedures:  LEFT BKA (04/19/2021)  Antimicrobials: None    Subjective: No issues this morning. Urinating well.  Objective: Vitals:   04/19/21 1847 04/19/21 1957 04/20/21 0007 04/20/21 0443  BP: (!) 157/85 (!) 151/80 (!) 172/86 138/82  Pulse: 89 87 94 86  Resp: 17 16 18 16   Temp: 98.6 F (37 C) 98.8 F (37.1 C) 99.5 F (37.5 C) 99.2 F (37.3 C)  TempSrc: Oral Oral Oral Oral  SpO2: 97% 95% 94% 97%  Weight:      Height:        Intake/Output Summary (Last 24 hours) at 04/20/2021 0704 Last data filed at 04/20/2021 0500 Gross per 24 hour  Intake 1097.5 ml  Output 1520 ml  Net -422.5 ml   Filed Weights   04/19/21 0719  Weight: (!) 170.1 kg    Examination:  General exam: Appears calm and comfortable Respiratory system: Clear to auscultation. Respiratory effort normal. Cardiovascular system: S1 & S2 heard, RRR. No murmurs, rubs, gallops or clicks. Gastrointestinal system: Abdomen is nondistended, soft and nontender. No organomegaly or masses felt. Normal bowel sounds heard. Central nervous system: Alert and oriented. No focal neurological deficits. Musculoskeletal: Left BKA with left stump in stump shrinker Skin: No cyanosis. No rashes Psychiatry: Judgement and insight appear normal. Mood & affect appropriate.     Data Reviewed: I have personally reviewed following labs and imaging studies  CBC Lab Results  Component Value Date   WBC 6.9 04/20/2021   RBC 3.25 (L) 04/20/2021   HGB 9.3 (L) 04/20/2021   HCT 29.8 (L) 04/20/2021   MCV 91.7 04/20/2021   MCH 28.6 04/20/2021   PLT 208 04/20/2021  MCHC 31.2 04/20/2021   RDW 15.2 04/20/2021   LYMPHSABS 1.1 02/20/2021   MONOABS 0.8 02/20/2021   EOSABS 0.2 02/20/2021   BASOSABS 0.0 02/20/2021     Last metabolic panel Lab Results  Component Value Date   NA 135 04/20/2021   K 4.3 04/20/2021   CL 104 04/20/2021   CO2 23 04/20/2021    BUN 18 04/20/2021   CREATININE 2.49 (H) 04/20/2021   GLUCOSE 153 (H) 04/20/2021   GFRNONAA 32 (L) 04/20/2021   GFRAA 56 (L) 01/04/2021   CALCIUM 8.0 (L) 04/20/2021   PHOS 3.1 02/20/2021   PROT 6.1 (L) 02/22/2021   ALBUMIN 2.1 (L) 02/22/2021   BILITOT 0.6 02/22/2021   ALKPHOS 80 02/22/2021   AST 21 02/22/2021   ALT 14 02/22/2021   ANIONGAP 8 04/20/2021    CBG (last 3)  Recent Labs    04/19/21 1035 04/19/21 2022 04/20/21 0627  GLUCAP 164* 129* 144*     GFR: Estimated Creatinine Clearance: 61.2 mL/min (A) (by C-G formula based on SCr of 2.49 mg/dL (H)).  Coagulation Profile: No results for input(s): INR, PROTIME in the last 168 hours.  Recent Results (from the past 240 hour(s))  SARS CORONAVIRUS 2 (TAT 6-24 HRS) Nasopharyngeal Nasopharyngeal Swab     Status: None   Collection Time: 04/17/21 11:48 AM   Specimen: Nasopharyngeal Swab  Result Value Ref Range Status   SARS Coronavirus 2 NEGATIVE NEGATIVE Final    Comment: (NOTE) SARS-CoV-2 target nucleic acids are NOT DETECTED.  The SARS-CoV-2 RNA is generally detectable in upper and lower respiratory specimens during the acute phase of infection. Negative results do not preclude SARS-CoV-2 infection, do not rule out co-infections with other pathogens, and should not be used as the sole basis for treatment or other patient management decisions. Negative results must be combined with clinical observations, patient history, and epidemiological information. The expected result is Negative.  Fact Sheet for Patients: HairSlick.no  Fact Sheet for Healthcare Providers: quierodirigir.com  This test is not yet approved or cleared by the [place] FDA and  has been authorized for detection and/or diagnosis of SARS-CoV-2 by FDA under an Emergency Use Authorization (EUA). This EUA will remain  in effect (meaning this test can be used) for the duration of the COVID-19  declaration under Se ction 564(b)(1) of the Act, 21 U.S.C. section 360bbb-3(b)(1), unless the authorization is terminated or revoked sooner.  Performed at [facility], [address] [place], [place] [address]         Radiology Studies: No results found.      Scheduled Meds:  amLODipine  10 mg Oral Daily   Chlorhexidine Gluconate Cloth  6 each Topical Daily   docusate sodium  100 mg Oral BID   doxycycline  100 mg Oral BID   enoxaparin (LOVENOX) injection  80 mg Subcutaneous Daily   hydrALAZINE  100 mg Oral Q8H   insulin aspart  0-20 Units Subcutaneous TID WC   insulin pump   Subcutaneous TID WC, HS, 0200   irbesartan  37.5 mg Oral Daily   metoprolol tartrate  100 mg Oral BID   pantoprazole  40 mg Oral BID   senna  1 tablet Oral BID   Continuous Infusions:  sodium chloride 75 mL/hr at 04/19/21 1231   magnesium sulfate bolus IVPB       LOS: 1 day     Jacquelin Hawking, MD [facility] 04/20/2021, 7:04 AM  If 7PM-7AM, please contact night-coverage [email or web address]

## 2021-04-20 NOTE — Evaluation (Signed)
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  He underwent attempt at tibio calcaneal arthrodesis in April.  His post op course was complicated by infection and hardware failure with recurrent dislocation of the calcaneus. Now s/p L BKA   Clinical Impression   Pt presents with decline in function and safety with ADLs and ADL mobility with impaired balance and endurance. PTA pt lived at home with his wife and children, was Ind with ADLs/selfcare until recently wife has been assisting him with LB ADLs, used a knee walker for mobility. Pt currently requires min - min guard A with LB ADLs, toileting and mobility/transfers using RW. Pt would benefit from acute OT services to address impairments and maximize level of function and safety    Follow Up Recommendations  Home health OT    Equipment Recommendations  Other (comment) (LH bath sponge)    Recommendations for Other Services       Precautions / Restrictions Precautions Precautions: Fall Precaution Comments: limb gaurd L LE Restrictions Weight Bearing Restrictions: Yes LUE Weight Bearing: Non weight bearing      Mobility Bed Mobility Overal bed mobility: Needs Assistance Bed Mobility: Supine to Sit;Sit to Supine     Supine to sit: Supervision Sit to supine: Supervision   General bed mobility comments: sup for safety, no physical assist    Transfers Overall transfer level: Needs assistance Equipment used: Rolling walker (2 wheeled) Transfers: Sit to/from [employer] Sit to Stand: Min guard Stand pivot transfers: Min guard            Balance Overall balance assessment: Needs assistance Sitting-balance support: No upper extremity supported;Feet supported Sitting balance-[name] Scale: Good     Standing balance support:  Bilateral upper extremity supported;During functional activity Standing balance-[name] Scale: Fair                             ADL either performed or assessed with clinical judgement   ADL Overall ADL's : Needs assistance/impaired Eating/Feeding: Independent;Sitting   Grooming: Wash/dry hands;Wash/dry face;Min guard;Standing   Upper Body Bathing: Set up;Sitting   Lower Body Bathing: Minimal assistance;Min guard;Sitting/lateral leans   Upper Body Dressing : Set up;Sitting   Lower Body Dressing: Minimal assistance;Sitting/lateral leans   Toilet Transfer: Min guard;Stand-pivot;RW;BSC   Toileting- Clothing Manipulation and Hygiene: Min guard;Sit to/from stand       Functional mobility during ADLs: Min guard;Rolling walker       Vision Patient Visual Report: No change from baseline       Perception     Praxis      Pertinent Vitals/Pain Pain Assessment: 0-10 Pain Score: 4  Pain Location: L LE Pain Descriptors / Indicators: Sore;Discomfort Pain Intervention(s): Monitored during session;Repositioned     Hand Dominance Right   Extremity/Trunk Assessment Upper Extremity Assessment Upper Extremity Assessment: Overall WFL for tasks assessed   Lower Extremity Assessment Lower Extremity Assessment: Defer to PT evaluation       Communication Communication Communication: No difficulties   Cognition Arousal/Alertness: Awake/alert Behavior During Therapy: WFL for tasks assessed/performed Overall Cognitive Status: Within Functional Limits for tasks assessed  General Comments       Exercises     Shoulder Instructions      Home Living Family/patient expects to be discharged to:: Private residence Living Arrangements: Spouse/significant other;Children Available Help at Discharge: Family Type of Home: Apartment Home Access: Level entry     Home Layout: One level     Bathroom Shower/Tub:  Tub/shower unit;Walk-in shower   Bathroom Toilet: Standard     Home Equipment: Walker - 2 wheels;Crutches;Tub bench;Bedside commode;Other (comment);Adaptive equipment Adaptive Equipment: Reacher        Prior Functioning/Environment Level of Independence: Independent with assistive device(s)        Comments: states that up unitl recently he was Ind with ADLs/selfcare, but currently needs some assist from his wife with LB ADLs and used knee walker for mobility        OT Problem List: Impaired balance (sitting and/or standing);Decreased activity tolerance;Decreased knowledge of use of DME or AE      OT Treatment/Interventions: Self-care/ADL training;DME and/or AE instruction;Therapeutic activities;Balance training;Patient/family education    OT Goals(Current goals can be found in the care plan section) Acute Rehab OT Goals Patient Stated Goal: go home OT Goal Formulation: With patient Time For Goal Achievement: 05/04/21 ADL Goals Pt Will Perform Grooming: with supervision;with set-up;standing Pt Will Perform Lower Body Bathing: with min guard assist;with supervision;with set-up;sitting/lateral leans Pt Will Perform Lower Body Dressing: with min guard assist;with supervision;with set-up;sitting/lateral leans Pt Will Transfer to Toilet: with supervision;with modified independence;ambulating Pt Will Perform Toileting - Clothing Manipulation and hygiene: with supervision;sit to/from stand  OT Frequency: Min 2X/week   Barriers to D/C:            Co-evaluation              AM-PAC OT "6 Clicks" Daily Activity     Outcome Measure Help from another person eating meals?: None Help from another person taking care of personal grooming?: A Little Help from another person toileting, which includes using toliet, bedpan, or urinal?: A Little Help from another person bathing (including washing, rinsing, drying)?: A Little Help from another person to put on and taking off regular  upper body clothing?: None Help from another person to put on and taking off regular lower body clothing?: A Little 6 Click Score: 20   End of Session Equipment Utilized During Treatment: Rolling walker;Gait belt  Activity Tolerance: Patient tolerated treatment well Patient left: in bed;with call bell/phone within reach  OT Visit Diagnosis: Unsteadiness on feet (R26.81);Other abnormalities of gait and mobility (R26.89)                Time: 6967-8938 OT Time Calculation (min): 25 min Charges:  OT General Charges $OT Visit: 1 Visit OT Evaluation $OT Eval Moderate Complexity: 1 Mod OT Treatments $Self Care/Home Management : 8-22 mins    Galen Manila 04/20/2021, 2:21 PM

## 2021-04-21 ENCOUNTER — Inpatient Hospital Stay (HOSPITAL_COMMUNITY): Payer: 59

## 2021-04-21 LAB — URINALYSIS, ROUTINE W REFLEX MICROSCOPIC
Bacteria, UA: NONE SEEN
Bilirubin Urine: NEGATIVE
Glucose, UA: NEGATIVE mg/dL
Ketones, ur: NEGATIVE mg/dL
Nitrite: NEGATIVE
Protein, ur: 100 mg/dL — AB
RBC / HPF: >50 RBC/hpf — ABNORMAL HIGH (ref 0–5)
Specific Gravity, Urine: 1.019 (ref 1.005–1.030)
pH: 5 (ref 5.0–8.0)

## 2021-04-21 LAB — CBC
HCT: 27.7 % — ABNORMAL LOW (ref 39.0–52.0)
Hemoglobin: 8.8 g/dL — ABNORMAL LOW (ref 13.0–17.0)
MCH: 28.5 pg (ref 26.0–34.0)
MCHC: 31.8 g/dL (ref 30.0–36.0)
MCV: 89.6 fL (ref 80.0–100.0)
Platelets: 190 10*3/uL (ref 150–400)
RBC: 3.09 MIL/uL — ABNORMAL LOW (ref 4.22–5.81)
RDW: 15.1 % (ref 11.5–15.5)
WBC: 6.7 10*3/uL (ref 4.0–10.5)
nRBC: 0 % (ref 0.0–0.2)

## 2021-04-21 LAB — BASIC METABOLIC PANEL
Anion gap: 7 (ref 5–15)
BUN: 24 mg/dL — ABNORMAL HIGH (ref 6–20)
CO2: 23 mmol/L (ref 22–32)
Calcium: 8.2 mg/dL — ABNORMAL LOW (ref 8.9–10.3)
Chloride: 105 mmol/L (ref 98–111)
Creatinine, Ser: 2.89 mg/dL — ABNORMAL HIGH (ref 0.61–1.24)
GFR, Estimated: 27 mL/min — ABNORMAL LOW (ref >60)
Glucose, Bld: 141 mg/dL — ABNORMAL HIGH (ref 70–99)
Potassium: 4.2 mmol/L (ref 3.5–5.1)
Sodium: 135 mmol/L (ref 135–145)

## 2021-04-21 LAB — CREATININE, URINE, RANDOM: Creatinine, Urine: 98.54 mg/dL

## 2021-04-21 LAB — SODIUM, URINE, RANDOM: Sodium, Ur: 103 mmol/L

## 2021-04-21 NOTE — Progress Notes (Signed)
   Subjective: 2 Days Post-Op Procedure(s) (LRB): AMPUTATION BELOW KNEE (Left)  Pt resting comfortably in no acute distress No new symptoms or issues Brace in place to left lower extremity Patient reports pain as mild.  Objective:   VITALS:   Vitals:   04/20/21 1934 04/21/21 0353  BP: (!) 155/89 (!) 157/81  Pulse: 83 84  Resp: 16 14  Temp: 98.6 F (37 C) 98.7 F (37.1 C)  SpO2: 95% 98%    Brace in place over left BKA Nv intact proximally   LABS Recent Labs    04/19/21 0751 04/20/21 0407  HGB 10.4* 9.3*  HCT 33.2* 29.8*  WBC 6.6 6.9  PLT 222 208    Recent Labs    04/19/21 0751 04/20/21 0407  NA 139 135  K 3.7 4.3  BUN 17 18  CREATININE 1.68* 2.49*  GLUCOSE 146* 153*     Assessment/Plan: 2 Days Post-Op Procedure(s) (LRB): AMPUTATION BELOW KNEE (Left) Continue current treatment and therapy D/c planning to probable SNF Will continue to monitor his progress     Alphonsa Overall PA-C, MPAS [facility] is now [facility] [address], [place], [place] [address] Phone: [phone] [email or web address] Facebook  [employer]

## 2021-04-21 NOTE — Progress Notes (Signed)
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  He underwent attempt at tibio calcaneal arthrodesis in April.  His post op course was complicated by infection and hardware failure with recurrent dislocation of the calcaneus. 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  Pt with other significant PMH of CKD, DM, head injury, HTN, neck surgery ,hip surgery, elbow surgery.    PT Comments    Pt seated in recliner and reports dizziness on arrival.  PTA obtained BP 142/80.  RN reports he had just been given pain meds.  Pt moved to sitting edge of recliner and BP 160/82.  After transfer back to bed BP 162/78.  Will continue to recommend HHPT.  Pt declined further PT intervention after back to bed.     Follow Up Recommendations  Home health PT     Equipment Recommendations  Wheelchair (measurements PT);Wheelchair cushion (measurements PT) (20 x 20)    Recommendations for Other Services       Precautions / Restrictions Precautions Precautions: Fall Precaution Comments: limb gaurd L LE Restrictions Weight Bearing Restrictions: Yes LUE Weight Bearing: Non weight bearing    Mobility  Bed Mobility Overal bed mobility: Needs Assistance Bed Mobility: Sit to Supine       Sit to supine: Supervision   General bed mobility comments: Increased time and effort this session but no assistance to return to supine, cautious supervision due to feeling dizziness.    Transfers Overall transfer level: Needs assistance Equipment used: Standard walker Transfers: Sit to/from Stand Sit to Stand: Min assist Stand pivot transfers: Min guard       General transfer comment: Cues for sequencing and safety.  Increased time and effort to rise into standing.  Ambulation/Gait Ambulation/Gait assistance: Min assist Gait Distance (Feet): 5  Feet Assistive device: Standard walker Gait Pattern/deviations: Shuffle     General Gait Details: Pt unable to perform hop to pattern so just pivots supporting foot to move from recliner to bed.  Assistance to move SW.   Optometrist    Modified Rankin (Stroke Patients Only)       Balance Overall balance assessment: Needs assistance Sitting-balance support: Feet supported;No upper extremity supported Sitting balance-[name] Scale: Good     Standing balance support: Bilateral upper extremity supported Standing balance-[name] Scale: Poor                              Cognition Arousal/Alertness: Lethargic;Suspect due to medications Behavior During Therapy: Flat affect Overall Cognitive Status: Impaired/Different from baseline Area of Impairment: Following commands;Safety/judgement;Problem solving                       Following Commands: Follows one step commands with increased time Safety/Judgement: Decreased awareness of deficits   Problem Solving: Requires verbal cues;Requires tactile cues General Comments: Pt very flat and reports dizziness, no signs of orthostasis likely due to pain medications.      Exercises      General Comments General comments (skin integrity, edema, etc.): limb protector donned incorrectly upon arrival, removed and reapplied educating positioning and straps      Pertinent Vitals/Pain Pain Assessment: Faces Pain Score: 2  Faces Pain Scale: Hurts a little bit  Pain Location: L LE Pain Descriptors / Indicators: Discomfort Pain Intervention(s): Monitored during session    Home Living                      Prior Function            PT Goals (current goals can now be found in the care plan section) Acute Rehab PT Goals Patient Stated Goal: go home Potential to Achieve Goals: Fair Progress towards PT goals: Progressing toward goals    Frequency    Min 5X/week      PT  Plan Current plan remains appropriate    Co-evaluation              AM-PAC PT "6 Clicks" Mobility   Outcome Measure  Help needed turning from your back to your side while in a flat bed without using bedrails?: A Little Help needed moving from lying on your back to sitting on the side of a flat bed without using bedrails?: A Little Help needed moving to and from a bed to a chair (including a wheelchair)?: A Little Help needed standing up from a chair using your arms (e.g., wheelchair or bedside chair)?: A Little Help needed to walk in hospital room?: A Lot Help needed climbing 3-5 steps with a railing? : Total 6 Click Score: 15    End of Session Equipment Utilized During Treatment: Gait belt Activity Tolerance:  (limited due to dizziness.) Patient left: in bed;with call bell/phone within reach;with bed alarm set Nurse Communication: Mobility status PT Visit Diagnosis: Muscle weakness (generalized) (M62.81);Difficulty in walking, not elsewhere classified (R26.2);Pain Pain - Right/Left: Left Pain - part of body: Leg     Time: 8413-2440 PT Time Calculation (min) (ACUTE ONLY): 16 min  Charges:  $Therapeutic Activity: 8-22 mins                     Bonney Leitz , PTA Acute Rehabilitation Services Pager [phone] Office [phone]    Dwayne Rocha Delay 04/21/2021, 4:08 PM

## 2021-04-21 NOTE — Progress Notes (Signed)
Pt with routine line care orders. Noted central line has been removed by radiology. Contacted nurse, Harvie Heck to have phlebotomy draw routine labs.

## 2021-04-21 NOTE — Progress Notes (Signed)
PROGRESS NOTE    Dwayne Rocha  YQM:[id number] DOB: 06/26/1976 DOA: 04/19/2021 PCP: Fleet Contras, MD   Brief Narrative: Dwayne Rocha is a 45 y.o. male with a history of diabetes mellitus, charcot foot/ankle deformity, hypertension, morbid obesity, CKD. Patient presented secondary to need for left BKA for charcot foot. Medicine service consulted for patient's chronic medical diagnoses.   Assessment & Plan:   Principal Problem:   Below-knee amputation of left lower extremity (HCC) Active Problems:   Essential hypertension   Uncontrolled type 2 diabetes mellitus with hyperglycemia (HCC)   Charcot's joint of ankle, left   Sleep apnea   Morbid obesity with BMI of 50.0-59.9, adult (HCC)   Stage 3a chronic kidney disease (HCC)   Left BKA Secondary to history of charcot foot which failed multiple corrective attempts. Management per orthopedic surgery.  AKI on CKD stage IIIa In setting of recent surgery. No significant hypotension noted during surgery. Patient is on lisinopril which was transitioned to irbesartan starting 6/10. Urine output diminished from last 24 hours but unsure of accuracy. Good urine output today so far. Renal ultrasound limited but did not show evidence of hydronephrosis. Overall patient appears volume up but this is chronic. -IV fluids -Hold ACEi/ARB -Adjust medications for renal function -Strict in/out in addition to daily weights  Primary hypertension Patient is on amlodipine, hydralazine, metoprolol tartrate, olmesartan as an outpatient. Blood pressure elevated while inpatient. -Continue amlodipine, hydralazine, metoprolol tartrate -Hold ACEi/ARB secondary to above  Diabetes mellitus, type 2 Appears to be well controlled. Hemoglobin A1C of 6.6%. Patient is on an insulin pump as an outpatient which has been resumed inpatient. Diabetes coordinator on board. -Insulin pump per patient  OSA on CPAP Documented, however patient states that he does not use  CPAP at home.  Morbid obesity Body mass index is 52.3 kg/m.  DVT prophylaxis: Per primary Code Status:   Code Status: Full Code Family Communication: None at bedside Disposition Plan: Per primary   Consultants:  General medicine  Procedures:  LEFT BKA (04/19/2021)  Antimicrobials: None    Subjective: No issues overnight. Urinating well.  Objective: Vitals:   04/20/21 1204 04/20/21 1934 04/21/21 0353 04/21/21 0820  BP: (!) 165/83 (!) 155/89 (!) 157/81 (!) 146/76  Pulse: 93 83 84 77  Resp: 18 16 14 19   Temp: 99.3 F (37.4 C) 98.6 F (37 C) 98.7 F (37.1 C) 98.5 F (36.9 C)  TempSrc: Oral Oral Oral Oral  SpO2: 99% 95% 98% 97%  Weight:      Height:        Intake/Output Summary (Last 24 hours) at 04/21/2021 0943 Last data filed at 04/21/2021 0758 Gross per 24 hour  Intake --  Output 875 ml  Net -875 ml    Filed Weights   04/19/21 0719  Weight: (!) 170.1 kg    Examination:  General exam: Appears calm and comfortable Respiratory system: Clear to auscultation. Respiratory effort normal. Cardiovascular system: S1 & S2 heard, RRR. No murmurs, rubs, gallops or clicks. Gastrointestinal system: Abdomen is nondistended, soft and nontender. Edema noted. No organomegaly or masses felt. Normal bowel sounds heard. Central nervous system: Alert and oriented. No focal neurological deficits. Musculoskeletal: BLE pitting edema. No calf tenderness. Left BKA Skin: No cyanosis. No rashes Psychiatry: Judgement and insight appear normal. Mood & affect appropriate.     Data Reviewed: I have personally reviewed following labs and imaging studies  CBC Lab Results  Component Value Date   WBC 6.7 04/21/2021   RBC 3.09 (  L) 04/21/2021   HGB 8.8 (L) 04/21/2021   HCT 27.7 (L) 04/21/2021   MCV 89.6 04/21/2021   MCH 28.5 04/21/2021   PLT 190 04/21/2021   MCHC 31.8 04/21/2021   RDW 15.1 04/21/2021   LYMPHSABS 1.1 02/20/2021   MONOABS 0.8 02/20/2021   EOSABS 0.2 02/20/2021    BASOSABS 0.0 02/20/2021     Last metabolic panel Lab Results  Component Value Date   NA 135 04/21/2021   K 4.2 04/21/2021   CL 105 04/21/2021   CO2 23 04/21/2021   BUN 24 (H) 04/21/2021   CREATININE 2.89 (H) 04/21/2021   GLUCOSE 141 (H) 04/21/2021   GFRNONAA 27 (L) 04/21/2021   GFRAA 56 (L) 01/04/2021   CALCIUM 8.2 (L) 04/21/2021   PHOS 3.1 02/20/2021   PROT 6.1 (L) 02/22/2021   ALBUMIN 2.1 (L) 02/22/2021   BILITOT 0.6 02/22/2021   ALKPHOS 80 02/22/2021   AST 21 02/22/2021   ALT 14 02/22/2021   ANIONGAP 7 04/21/2021    CBG (last 3)  Recent Labs    04/19/21 1035 04/19/21 2022 04/20/21 0627  GLUCAP 164* 129* 144*      GFR: Estimated Creatinine Clearance: 52.8 mL/min (A) (by C-G formula based on SCr of 2.89 mg/dL (H)).  Coagulation Profile: No results for input(s): INR, PROTIME in the last 168 hours.  Recent Results (from the past 240 hour(s))  SARS CORONAVIRUS 2 (TAT 6-24 HRS) Nasopharyngeal Nasopharyngeal Swab     Status: None   Collection Time: 04/17/21 11:48 AM   Specimen: Nasopharyngeal Swab  Result Value Ref Range Status   SARS Coronavirus 2 NEGATIVE NEGATIVE Final    Comment: (NOTE) SARS-CoV-2 target nucleic acids are NOT DETECTED.  The SARS-CoV-2 RNA is generally detectable in upper and lower respiratory specimens during the acute phase of infection. Negative results do not preclude SARS-CoV-2 infection, do not rule out co-infections with other pathogens, and should not be used as the sole basis for treatment or other patient management decisions. Negative results must be combined with clinical observations, patient history, and epidemiological information. The expected result is Negative.  Fact Sheet for Patients: HairSlick.no  Fact Sheet for Healthcare Providers: quierodirigir.com  This test is not yet approved or cleared by the [place] FDA and  has been authorized for detection  and/or diagnosis of SARS-CoV-2 by FDA under an Emergency Use Authorization (EUA). This EUA will remain  in effect (meaning this test can be used) for the duration of the COVID-19 declaration under Se ction 564(b)(1) of the Act, 21 U.S.C. section 360bbb-3(b)(1), unless the authorization is terminated or revoked sooner.  Performed at [facility], [address] [place], [place] [address]          Radiology Studies: IR Removal Tun Cv Cath W/O FL  Result Date: 04/20/2021 EXAM: REMOVAL TUNNELED CENTRAL VENOUS CATHETER MEDICATIONS: No antibiotic was indicated for this procedure. Moderate (conscious) sedation was not employed during this procedure. FLUOROSCOPY TIME:  None COMPLICATIONS: None immediate. PROCEDURE: Informed written consent was obtained from the patient after a thorough discussion of the procedural risks, benefits and alternatives. All questions were addressed. Maximal Sterile Barrier Technique was utilized including caps, mask, sterile gowns, sterile gloves, sterile drape, hand hygiene and skin antiseptic. A timeout was performed prior to the initiation of the procedure. The patient's right chest and catheter was prepped and draped in a normal sterile fashion. Heparin was removed from both ports of catheter. 1% lidocaine was used for local anesthesia. Using gentle blunt  dissection the cuff of the catheter was exposed and the catheter was removed in it's entirety. Pressure was held till hemostasis was obtained. A sterile dressing was applied. The patient tolerated the procedure well with no immediate complications. IMPRESSION: Successful catheter removal as described above. Read by: Anders Grant, NP Electronically Signed   By: Malachy Moan M.D.   On: 04/20/2021 15:57        Scheduled Meds:  amLODipine  10 mg Oral Daily   Chlorhexidine Gluconate Cloth  6 each Topical Daily   docusate sodium  100 mg Oral BID   doxycycline  100 mg Oral BID   enoxaparin (LOVENOX)  injection  80 mg Subcutaneous Daily   hydrALAZINE  100 mg Oral Q8H   insulin aspart  0-20 Units Subcutaneous TID WC   insulin pump   Subcutaneous TID WC, HS, 0200   metoprolol tartrate  100 mg Oral BID   pantoprazole  40 mg Oral BID   senna  1 tablet Oral BID   Continuous Infusions:  sodium chloride 75 mL/hr at 04/19/21 1231   magnesium sulfate bolus IVPB       LOS: 2 days     Jacquelin Hawking, MD [facility] 04/21/2021, 9:43 AM  If 7PM-7AM, please contact night-coverage [email or web address]

## 2021-04-21 NOTE — Progress Notes (Signed)
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  He underwent attempt at tibio calcaneal arthrodesis in April.  His post op course was complicated by infection and hardware failure with recurrent dislocation of the calcaneus. 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  Pt with other significant PMH of CKD, DM, head injury, HTN, neck surgery ,hip surgery, elbow surgery.   OT comments  Dwayne Rocha is progressing well. He completed bed mobility with mod I. While sitting EOB, pt educated on donning limb protector and correct positioning, pt demonstrated good understanding. Pt was anxious for stand pivot transfer from bed>chair with rw, however completed transfer with min guard and verbal cues. Pt verbalized concerns with bathroom use at home, stated his wc would not fit. Verbally reviewed safe alternatives for safe bathroom use at d/c. Pt continues to benefit from continued OT acutely to maximize indep in ADLs and mobility. D/c plan remains appropriate.     Follow Up Recommendations  Home health OT    Equipment Recommendations  Other (comment);Wheelchair (measurements OT)       Precautions / Restrictions Precautions Precautions: Fall Precaution Comments: limb gaurd L LE Restrictions Weight Bearing Restrictions: Yes LUE Weight Bearing: Non weight bearing       Mobility Bed Mobility Overal bed mobility: Modified Independent             General bed mobility comments: increased time, use of bed rails, HOB elevated    Transfers Overall transfer level: Needs assistance Equipment used: Rolling walker (2 wheeled) Transfers: Sit to/from [employer] Sit to Stand: Min guard Stand pivot transfers: Min guard       General transfer comment: pt anxious throughout stand pivot    Balance Overall balance assessment:  Needs assistance Sitting-balance support: Feet supported;No upper extremity supported Sitting balance-[name] Scale: Good     Standing balance support: Bilateral upper extremity supported Standing balance-[name] Scale: Poor         ADL either performed or assessed with clinical judgement   ADL Overall ADL's : Needs assistance/impaired Eating/Feeding: Independent;Sitting                       Toilet Transfer: Min guard;Stand-pivot;RW;BSC           Functional mobility during ADLs: Min guard;Rolling walker General ADL Comments: verbally reviewed LB dressing with compensatory techniques and residual limb wrapping      Cognition Arousal/Alertness: Awake/alert Behavior During Therapy: WFL for tasks assessed/performed Overall Cognitive Status: Within Functional Limits for tasks assessed                                                General Comments limb protector donned incorrectly upon arrival, removed and reapplied educating positioning and straps    Pertinent Vitals/ Pain       Pain Assessment: Faces Faces Pain Scale: Hurts a little bit Pain Location: L LE Pain Descriptors / Indicators: Sore;Discomfort Pain Intervention(s): Monitored during session   Frequency  Min 2X/week        Progress Toward Goals  OT Goals(current goals can now be found in the care plan section)  Progress towards OT goals: Progressing toward goals  Acute Rehab OT Goals  Patient Stated Goal: go home OT Goal Formulation: With patient Time For Goal Achievement: 05/04/21 ADL Goals Pt Will Perform Grooming: with supervision;with set-up;standing Pt Will Perform Lower Body Bathing: with min guard assist;with supervision;with set-up;sitting/lateral leans Pt Will Perform Lower Body Dressing: with min guard assist;with supervision;with set-up;sitting/lateral leans Pt Will Transfer to Toilet: with supervision;with modified independence;ambulating Pt Will Perform  Toileting - Clothing Manipulation and hygiene: with supervision;sit to/from stand  Plan Discharge plan remains appropriate       AM-PAC OT "6 Clicks" Daily Activity     Outcome Measure   Help from another person eating meals?: None Help from another person taking care of personal grooming?: A Little Help from another person toileting, which includes using toliet, bedpan, or urinal?: A Little Help from another person bathing (including washing, rinsing, drying)?: A Little Help from another person to put on and taking off regular upper body clothing?: None Help from another person to put on and taking off regular lower body clothing?: A Little 6 Click Score: 20    End of Session Equipment Utilized During Treatment: Rolling walker;Other (comment) (limp protector)  OT Visit Diagnosis: Unsteadiness on feet (R26.81);Other abnormalities of gait and mobility (R26.89)   Activity Tolerance Patient tolerated treatment well   Patient Left in chair;with call bell/phone within reach   Nurse Communication Mobility status (500 mL uring output)        Time: 4562-5638 OT Time Calculation (min): 22 min  Charges: OT General Charges $OT Visit: 1 Visit OT Treatments $Therapeutic Activity: 8-22 mins  {   Ivana Nicastro A Dejanay Wamboldt 04/21/2021, 2:06 PM

## 2021-04-21 NOTE — Plan of Care (Signed)

## 2021-04-22 ENCOUNTER — Encounter (HOSPITAL_COMMUNITY): Payer: Self-pay | Admitting: Orthopedic Surgery

## 2021-04-22 LAB — GLUCOSE, CAPILLARY
Glucose-Capillary: 172 mg/dL — ABNORMAL HIGH (ref 70–99)
Glucose-Capillary: 198 mg/dL — ABNORMAL HIGH (ref 70–99)
Glucose-Capillary: 132 mg/dL — ABNORMAL HIGH (ref 70–99)

## 2021-04-22 LAB — URINALYSIS, ROUTINE W REFLEX MICROSCOPIC
Bacteria, UA: NONE SEEN
Bilirubin Urine: NEGATIVE
Glucose, UA: 50 mg/dL — AB
Hgb urine dipstick: NEGATIVE
Ketones, ur: NEGATIVE mg/dL
Leukocytes,Ua: NEGATIVE
Nitrite: NEGATIVE
Protein, ur: >=300 mg/dL — AB
Specific Gravity, Urine: 1.014 (ref 1.005–1.030)
pH: 5 (ref 5.0–8.0)

## 2021-04-22 LAB — BASIC METABOLIC PANEL
Anion gap: 7 (ref 5–15)
BUN: 21 mg/dL — ABNORMAL HIGH (ref 6–20)
CO2: 22 mmol/L (ref 22–32)
Calcium: 8.2 mg/dL — ABNORMAL LOW (ref 8.9–10.3)
Chloride: 106 mmol/L (ref 98–111)
Creatinine, Ser: 1.87 mg/dL — ABNORMAL HIGH (ref 0.61–1.24)
GFR, Estimated: 45 mL/min — ABNORMAL LOW (ref >60)
Glucose, Bld: 166 mg/dL — ABNORMAL HIGH (ref 70–99)
Potassium: 4.3 mmol/L (ref 3.5–5.1)
Sodium: 135 mmol/L (ref 135–145)

## 2021-04-22 MED ORDER — BUPIVACAINE-EPINEPHRINE (PF) 0.5% -1:200000 IJ SOLN
INTRAMUSCULAR | Status: DC | PRN
  Administered 2021-04-19 (×2): 20 mL

## 2021-04-22 MED ORDER — LIDOCAINE-EPINEPHRINE (PF) 1.5 %-1:200000 IJ SOLN
INTRAMUSCULAR | Status: DC | PRN
  Administered 2021-04-19: 5 mL via PERINEURAL
  Administered 2021-04-19: 10 mL via PERINEURAL

## 2021-04-22 NOTE — Progress Notes (Signed)
Inpatient Diabetes Program Recommendations  AACE/ADA: New Consensus Statement on Inpatient Glycemic Control   Target Ranges:  Prepandial:   less than 140 mg/dL      Peak postprandial:   less than 180 mg/dL (1-2 hours)      Critically ill patients:  140 - 180 mg/dL  Results for AUGUST, LONGEST (MRN [id number]) as of 04/22/2021 08:48  Ref. Range 04/20/2021 06:27  Glucose-Capillary Latest Ref Range: 70 - 99 mg/dL 384 (H)   Results for [name], [name] (MRN [id number]) as of 04/22/2021 08:48  Ref. Range 04/21/2021 06:33  Glucose Latest Ref Range: 70 - 99 mg/dL 122 (H)    Review of Glycemic Control  Diabetes history: DM2 Outpatient Diabetes medications: Daily basal rates: 0.9, 1 units per 13 grams of CHO, 1 unit drops glucose 45 mg/dl Current orders for Inpatient glycemic control: Insulin Pump ACHS&2am, Novolog 0-20 units TID with meals   Inpatient Diabetes Program Recommendations:     Insulin: Please consider discontinuing Novolog correction scale as patient is using insulin pump for all insulin needs.   Glucose Monitoring: NURSING, please check finger stick glucose with hospital glucometer ACHS&2am as ordered. Even if patient is using a continuous glucose monitoring sensor, we will still need to do finger sticks while inpatient.  NOTE: Per chart current insulin pump settings are as follows:   Basal insulin 12A      0.9 units/hour Total daily basal insulin: 21.6 units/24 hours Carb Coverage 1:12     1 unit for every 13 grams of carbohydrates Insulin Sensitivity 1:45     1 unit drops blood glucose 45 mg/dl   Thanks, Orlando Penner, RN, MSN, CDE Diabetes Coordinator Inpatient Diabetes Program [phone] (Team Pager from 8am to 5pm)

## 2021-04-22 NOTE — Anesthesia Postprocedure Evaluation (Signed)
Anesthesia Post Note  Patient: [name]  Procedure(s) Performed: AMPUTATION BELOW KNEE (Left: Knee)     Patient location during evaluation: PACU Anesthesia Type: Regional Level of consciousness: awake and alert Pain management: pain level controlled Vital Signs Assessment: post-procedure vital signs reviewed and stable Respiratory status: spontaneous breathing, nonlabored ventilation, respiratory function stable and patient connected to nasal cannula oxygen Cardiovascular status: stable and blood pressure returned to baseline Postop Assessment: no apparent nausea or vomiting Anesthetic complications: no   No notable events documented.  Last Vitals:  Vitals:   04/22/21 1107 04/22/21 1459  BP: 138/68 135/70  Pulse: 85 83  Resp:  18  Temp:  37.3 C  SpO2:  100%    Last Pain:  Vitals:   04/22/21 1459  TempSrc: Oral  PainSc:                  Ayman Brull

## 2021-04-22 NOTE — Anesthesia Procedure Notes (Signed)
Anesthesia Regional Block: Femoral nerve block   Pre-Anesthetic Checklist: , timeout performed,  Correct Patient, Correct Site, Correct Laterality,  Correct Procedure, Correct Position, site marked,  Risks and benefits discussed,  Surgical consent,  Pre-op evaluation,  At surgeon's request and post-op pain management  Laterality: Left  Prep: chloraprep       Needles:  Injection technique: Single-shot      Needle Length: 9cm  Needle Gauge: 22     Additional Needles: Arrow StimuQuik ECHO Echogenic Stimulating PNB Needle  Procedures:,,,, ultrasound used (permanent image in chart),,    Narrative:  Start time: 04/19/2021 9:12 AM End time: 04/19/2021 9:16 AM Injection made incrementally with aspirations every 5 mL.  Performed by: Personally  Anesthesiologist: Val Eagle, MD

## 2021-04-22 NOTE — Progress Notes (Signed)
Patient refuses NIV for the night 
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
## 2021-04-22 NOTE — Progress Notes (Signed)
Pt refused cpap for tonight. Pt does not wear cpap at home.  

## 2021-04-22 NOTE — Anesthesia Procedure Notes (Signed)
Anesthesia Regional Block: Popliteal block   Pre-Anesthetic Checklist: , timeout performed,  Correct Patient, Correct Site, Correct Laterality,  Correct Procedure, Correct Position, site marked,  Risks and benefits discussed,  Surgical consent,  Pre-op evaluation,  At surgeon's request and post-op pain management  Laterality: Left and Lower  Prep: chloraprep       Needles:  Injection technique: Single-shot      Needle Length: 9cm  Needle Gauge: 22     Additional Needles: Arrow StimuQuik ECHO Echogenic Stimulating PNB Needle  Procedures:,,,, ultrasound used (permanent image in chart),,    Narrative:  Start time: 04/19/2021 9:18 AM End time: 04/19/2021 9:21 AM Injection made incrementally with aspirations every 5 mL.  Performed by: Personally  Anesthesiologist: Val Eagle, MD

## 2021-04-22 NOTE — Progress Notes (Signed)
PROGRESS NOTE    English Craighead  GNF:[id number] DOB: 11-10-1976 DOA: 04/19/2021 PCP: Fleet Contras, MD   Brief Narrative: Jarid Sasso is a 45 y.o. male with a history of diabetes mellitus, charcot foot/ankle deformity, hypertension, morbid obesity, CKD. Patient presented secondary to need for left BKA for charcot foot. Medicine service consulted for patient's chronic medical diagnoses.   Assessment & Plan:   Principal Problem:   Below-knee amputation of left lower extremity (HCC) Active Problems:   Essential hypertension   Uncontrolled type 2 diabetes mellitus with hyperglycemia (HCC)   Charcot's joint of ankle, left   Sleep apnea   Morbid obesity with BMI of 50.0-59.9, adult (HCC)   Stage 3a chronic kidney disease (HCC)   Left BKA Secondary to history of charcot foot which failed multiple corrective attempts. Management per orthopedic surgery.  AKI on CKD stage IIIa Baseline creatinine of 1.6-1.7. Creatinine up to 2.89 this admission. In setting of recent surgery. No significant hypotension noted during surgery. Patient is on lisinopril which was transitioned to irbesartan starting 6/10. Urine output diminished from last 24 hours but unsure of accuracy. Good urine output in last 24 hours. Renal ultrasound limited but did not show evidence of hydronephrosis. Overall patient appears volume up but this is chronic. Improved today with a creatinine down to 1.87 -Discontinue IV fluids  -Hold ACEi/ARB -Adjust medications for renal function -Strict in/out in addition to daily weights  Gross hematuria Noted on urinalysis report, however patient states he has never noticed hematuria. -Repeat urinalysis  Primary hypertension Patient is on amlodipine, hydralazine, metoprolol tartrate, olmesartan as an outpatient. Blood pressure elevated while inpatient. -Continue amlodipine, hydralazine, metoprolol tartrate -Hold ACEi/ARB secondary to above  Diabetes mellitus, type 2 Appears to  be well controlled. Hemoglobin A1C of 6.6%. Patient is on an insulin pump as an outpatient which has been resumed inpatient. Diabetes coordinator on board. -Insulin pump per patient  OSA on CPAP Documented, however patient states that he does not use CPAP at home.  Morbid obesity Body mass index is 52.3 kg/m.  DVT prophylaxis: Per primary Code Status:   Code Status: Full Code Family Communication: None at bedside Disposition Plan: Per primary   Consultants:  General medicine  Procedures:  LEFT BKA (04/19/2021)  Antimicrobials: None    Subjective: No concerns today. Did not notice any hematuria yesterday.  Objective: Vitals:   04/21/21 2025 04/22/21 0509 04/22/21 0813 04/22/21 1107  BP: (!) 156/81 (!) 152/77 (!) 150/77 138/68  Pulse: 81 81 82 85  Resp: 16 18 18    Temp: 99.2 F (37.3 C) 98.8 F (37.1 C) 99.1 F (37.3 C)   TempSrc: Oral Oral Oral   SpO2: 94% 98% 98%   Weight:      Height:        Intake/Output Summary (Last 24 hours) at 04/22/2021 1236 Last data filed at 04/22/2021 0806 Gross per 24 hour  Intake --  Output 2000 ml  Net -2000 ml    Filed Weights   04/19/21 0719  Weight: (!) 170.1 kg    Examination:  General exam: Appears calm and comfortable Respiratory system: Clear to auscultation. Respiratory effort normal. Cardiovascular system: S1 & S2 heard, RRR. No murmurs, rubs, gallops or clicks. Gastrointestinal system: Abdomen is nondistended, soft and nontender. Edema noted. No organomegaly or masses felt. Normal bowel sounds heard. Central nervous system: Alert and oriented. No focal neurological deficits. Musculoskeletal: BLE pitting edema. No calf tenderness. Left BKA Skin: No cyanosis. No rashes Psychiatry: Judgement and insight  appear normal. Mood & affect appropriate.     Data Reviewed: I have personally reviewed following labs and imaging studies  CBC Lab Results  Component Value Date   WBC 6.7 04/21/2021   RBC 3.09 (L) 04/21/2021    HGB 8.8 (L) 04/21/2021   HCT 27.7 (L) 04/21/2021   MCV 89.6 04/21/2021   MCH 28.5 04/21/2021   PLT 190 04/21/2021   MCHC 31.8 04/21/2021   RDW 15.1 04/21/2021   LYMPHSABS 1.1 02/20/2021   MONOABS 0.8 02/20/2021   EOSABS 0.2 02/20/2021   BASOSABS 0.0 02/20/2021     Last metabolic panel Lab Results  Component Value Date   NA 135 04/22/2021   K 4.3 04/22/2021   CL 106 04/22/2021   CO2 22 04/22/2021   BUN 21 (H) 04/22/2021   CREATININE 1.87 (H) 04/22/2021   GLUCOSE 166 (H) 04/22/2021   GFRNONAA 45 (L) 04/22/2021   GFRAA 56 (L) 01/04/2021   CALCIUM 8.2 (L) 04/22/2021   PHOS 3.1 02/20/2021   PROT 6.1 (L) 02/22/2021   ALBUMIN 2.1 (L) 02/22/2021   BILITOT 0.6 02/22/2021   ALKPHOS 80 02/22/2021   AST 21 02/22/2021   ALT 14 02/22/2021   ANIONGAP 7 04/22/2021    CBG (last 3)  Recent Labs    04/19/21 2022 04/20/21 0627 04/22/21 1212  GLUCAP 129* 144* 172*      GFR: Estimated Creatinine Clearance: 81.6 mL/min (A) (by C-G formula based on SCr of 1.87 mg/dL (H)).  Coagulation Profile: No results for input(s): INR, PROTIME in the last 168 hours.  Recent Results (from the past 240 hour(s))  SARS CORONAVIRUS 2 (TAT 6-24 HRS) Nasopharyngeal Nasopharyngeal Swab     Status: None   Collection Time: 04/17/21 11:48 AM   Specimen: Nasopharyngeal Swab  Result Value Ref Range Status   SARS Coronavirus 2 NEGATIVE NEGATIVE Final    Comment: (NOTE) SARS-CoV-2 target nucleic acids are NOT DETECTED.  The SARS-CoV-2 RNA is generally detectable in upper and lower respiratory specimens during the acute phase of infection. Negative results do not preclude SARS-CoV-2 infection, do not rule out co-infections with other pathogens, and should not be used as the sole basis for treatment or other patient management decisions. Negative results must be combined with clinical observations, patient history, and epidemiological information. The expected result is Negative.  Fact Sheet for  Patients: HairSlick.no  Fact Sheet for Healthcare Providers: quierodirigir.com  This test is not yet approved or cleared by the [place] FDA and  has been authorized for detection and/or diagnosis of SARS-CoV-2 by FDA under an Emergency Use Authorization (EUA). This EUA will remain  in effect (meaning this test can be used) for the duration of the COVID-19 declaration under Se ction 564(b)(1) of the Act, 21 U.S.C. section 360bbb-3(b)(1), unless the authorization is terminated or revoked sooner.  Performed at [facility], [address] [place], [place] [address]          Radiology Studies: US RENAL  Result Date: 04/21/2021 CLINICAL DATA:  Acute on chronic kidney disease EXAM: RENAL / URINARY TRACT ULTRASOUND COMPLETE COMPARISON:  CT scan of the kidneys 02/17/2021 FINDINGS: Right Kidney: Renal measurements: 10.7 x 6.1 x 5.9 cm = volume: 199 mL. Echogenicity within normal limits. No mass or hydronephrosis visualized. Left Kidney: Renal measurements: 11.6 x 6.4 x 7.9 cm = volume: 308 mL. Echogenicity within normal limits. No mass or hydronephrosis visualized. Bladder: Appears normal for degree of bladder distention. Other: None. IMPRESSION: 1. No evidence of  hydronephrosis. 2. Slightly limited evaluation of the kidney secondary to patient body habitus. Parenchymal echogenicity appears grossly within normal limits. No significant interval change comparing across modalities to the relatively recent prior CT scan from 02/17/2021. Electronically Signed   By: Malachy Moan M.D.   On: 04/21/2021 11:51   IR Removal Tun Cv Cath W/O FL  Result Date: 04/20/2021 EXAM: REMOVAL TUNNELED CENTRAL VENOUS CATHETER MEDICATIONS: No antibiotic was indicated for this procedure. Moderate (conscious) sedation was not employed during this procedure. FLUOROSCOPY TIME:  None COMPLICATIONS: None immediate. PROCEDURE: Informed written consent was  obtained from the patient after a thorough discussion of the procedural risks, benefits and alternatives. All questions were addressed. Maximal Sterile Barrier Technique was utilized including caps, mask, sterile gowns, sterile gloves, sterile drape, hand hygiene and skin antiseptic. A timeout was performed prior to the initiation of the procedure. The patient's right chest and catheter was prepped and draped in a normal sterile fashion. Heparin was removed from both ports of catheter. 1% lidocaine was used for local anesthesia. Using gentle blunt dissection the cuff of the catheter was exposed and the catheter was removed in it's entirety. Pressure was held till hemostasis was obtained. A sterile dressing was applied. The patient tolerated the procedure well with no immediate complications. IMPRESSION: Successful catheter removal as described above. Read by: Anders Grant, NP Electronically Signed   By: Malachy Moan M.D.   On: 04/20/2021 15:57        Scheduled Meds:  amLODipine  10 mg Oral Daily   Chlorhexidine Gluconate Cloth  6 each Topical Daily   docusate sodium  100 mg Oral BID   doxycycline  100 mg Oral BID   enoxaparin (LOVENOX) injection  80 mg Subcutaneous Daily   hydrALAZINE  100 mg Oral Q8H   insulin aspart  0-20 Units Subcutaneous TID WC   insulin pump   Subcutaneous TID WC, HS, 0200   metoprolol tartrate  100 mg Oral BID   pantoprazole  40 mg Oral BID   senna  1 tablet Oral BID   Continuous Infusions:  magnesium sulfate bolus IVPB       LOS: 3 days     Jacquelin Hawking, MD [facility] 04/22/2021, 12:36 PM  If 7PM-7AM, please contact night-coverage [email or web address]

## 2021-04-23 LAB — GLUCOSE, CAPILLARY
Glucose-Capillary: 135 mg/dL — ABNORMAL HIGH (ref 70–99)
Glucose-Capillary: 186 mg/dL — ABNORMAL HIGH (ref 70–99)
Glucose-Capillary: 145 mg/dL — ABNORMAL HIGH (ref 70–99)
Glucose-Capillary: 119 mg/dL — ABNORMAL HIGH (ref 70–99)

## 2021-04-23 LAB — SURGICAL PATHOLOGY

## 2021-04-23 MED ORDER — INSULIN GLARGINE 100 UNIT/ML ~~LOC~~ SOLN
24.0000 [IU] | Freq: Every day | SUBCUTANEOUS | Status: DC
  Administered 2021-04-23 – 2021-04-26 (×4): 24 [IU] via SUBCUTANEOUS
  Filled 2021-04-23 (×5): qty 0.24

## 2021-04-23 MED ORDER — INSULIN ASPART 100 UNIT/ML IJ SOLN
4.0000 [IU] | Freq: Three times a day (TID) | INTRAMUSCULAR | Status: DC
  Administered 2021-04-23 – 2021-04-26 (×10): 4 [IU] via SUBCUTANEOUS

## 2021-04-23 MED ORDER — INSULIN ASPART 100 UNIT/ML IJ SOLN
0.0000 [IU] | Freq: Three times a day (TID) | INTRAMUSCULAR | Status: DC
  Administered 2021-04-23 – 2021-04-24 (×2): 3 [IU] via SUBCUTANEOUS
  Administered 2021-04-25 (×3): 2 [IU] via SUBCUTANEOUS
  Administered 2021-04-26: 3 [IU] via SUBCUTANEOUS
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
## 2021-04-23 NOTE — Progress Notes (Signed)
PROGRESS NOTE    Dwayne Rocha  QBH:[id number] DOB: 22-May-1976 DOA: 04/19/2021 PCP: Fleet Contras, MD   Brief Narrative: Dwayne Rocha is a 45 y.o. male with a history of diabetes mellitus, charcot foot/ankle deformity, hypertension, morbid obesity, CKD. Patient presented secondary to need for left BKA for charcot foot. Medicine service consulted for patient's chronic medical diagnoses.   Assessment & Plan:   Principal Problem:   Below-knee amputation of left lower extremity (HCC) Active Problems:   Essential hypertension   Uncontrolled type 2 diabetes mellitus with hyperglycemia (HCC)   Charcot's joint of ankle, left   Sleep apnea   Morbid obesity with BMI of 50.0-59.9, adult (HCC)   Stage 3a chronic kidney disease (HCC)   Left BKA Secondary to history of charcot foot which failed multiple corrective attempts. Management per orthopedic surgery.  AKI on CKD stage IIIa Baseline creatinine of 1.6-1.7. Creatinine up to 2.89 this admission. In setting of recent surgery. No significant hypotension noted during surgery. Patient is on lisinopril which was transitioned to irbesartan starting 6/10. Urine output diminished from last 24 hours but unsure of accuracy. Good urine output in last 24 hours. Renal ultrasound limited but did not show evidence of hydronephrosis. Overall patient appears volume up but this is chronic. Improved with a creatinine down to 1.87 -Recommend outpatient BMP in 7 days post-discharge -Adjust medications for renal function -Strict in/out in addition to daily weights  Gross hematuria Ruled out. Noted on urinalysis report, however patient states he has never noticed hematuria. Repeat urinalysis without hematuria. Possible error.  Primary hypertension Patient is on amlodipine, hydralazine, metoprolol tartrate, olmesartan as an outpatient. Blood pressure elevated while inpatient. -Continue amlodipine, hydralazine, metoprolol tartrate -Recommend to discontinue  olmesartan on discharge pending stabilization of creatinine as an outpatient.  Diabetes mellitus, type 2 Appears to be well controlled. Hemoglobin A1C of 6.6%. Patient is on an insulin pump as an outpatient which has been resumed inpatient. Diabetes coordinator on board. -Insulin pump per patient  OSA on CPAP Documented, however patient states that he does not use CPAP at home.  Morbid obesity Body mass index is 52.55 kg/m.  DVT prophylaxis: Per primary Code Status:   Code Status: Full Code Family Communication: None at bedside Disposition Plan: Per primary   Consultants:  General medicine  Procedures:  LEFT BKA (04/19/2021)  Antimicrobials: None    Subjective: Does not want to continue insulin pump if he still needs to receive CBG checks. No other concerns.  Objective: Vitals:   04/22/21 1459 04/22/21 2110 04/23/21 0502 04/23/21 0845  BP: 135/70 (!) 148/67 (!) 152/76 (!) 145/67  Pulse: 83 84 76 86  Resp: 18 17 17 17   Temp: 99.2 F (37.3 C) 98.9 F (37.2 C) 98.4 F (36.9 C) 98.3 F (36.8 C)  TempSrc: Oral Oral Oral Oral  SpO2: 100% 97% 99% 99%  Weight:      Height:        Intake/Output Summary (Last 24 hours) at 04/23/2021 1226 Last data filed at 04/23/2021 1023 Gross per 24 hour  Intake 1391.81 ml  Output 1800 ml  Net -408.19 ml    Filed Weights   04/19/21 0719 04/22/21 0900  Weight: (!) 170.1 kg (!) 170.9 kg    Examination:  General exam: Appears calm and comfortable Respiratory system: Clear to auscultation. Respiratory effort normal. Cardiovascular system: S1 & S2 heard, RRR. No murmurs, rubs, gallops or clicks. Gastrointestinal system: Abdomen is nondistended, soft and nontender. No organomegaly or masses felt. Normal bowel  sounds heard. Central nervous system: Alert and oriented. No focal neurological deficits. Musculoskeletal: Left BKA. No calf tenderness Skin: No cyanosis. No rashes Psychiatry: Judgement and insight appear normal. Mood &  affect appropriate.     Data Reviewed: I have personally reviewed following labs and imaging studies  CBC Lab Results  Component Value Date   WBC 6.7 04/21/2021   RBC 3.09 (L) 04/21/2021   HGB 8.8 (L) 04/21/2021   HCT 27.7 (L) 04/21/2021   MCV 89.6 04/21/2021   MCH 28.5 04/21/2021   PLT 190 04/21/2021   MCHC 31.8 04/21/2021   RDW 15.1 04/21/2021   LYMPHSABS 1.1 02/20/2021   MONOABS 0.8 02/20/2021   EOSABS 0.2 02/20/2021   BASOSABS 0.0 02/20/2021     Last metabolic panel Lab Results  Component Value Date   NA 135 04/22/2021   K 4.3 04/22/2021   CL 106 04/22/2021   CO2 22 04/22/2021   BUN 21 (H) 04/22/2021   CREATININE 1.87 (H) 04/22/2021   GLUCOSE 166 (H) 04/22/2021   GFRNONAA 45 (L) 04/22/2021   GFRAA 56 (L) 01/04/2021   CALCIUM 8.2 (L) 04/22/2021   PHOS 3.1 02/20/2021   PROT 6.1 (L) 02/22/2021   ALBUMIN 2.1 (L) 02/22/2021   BILITOT 0.6 02/22/2021   ALKPHOS 80 02/22/2021   AST 21 02/22/2021   ALT 14 02/22/2021   ANIONGAP 7 04/22/2021    CBG (last 3)  Recent Labs    04/22/21 2109 04/23/21 0627 04/23/21 1141  GLUCAP 132* 135* 186*      GFR: Estimated Creatinine Clearance: 81.8 mL/min (A) (by C-G formula based on SCr of 1.87 mg/dL (H)).  Coagulation Profile: No results for input(s): INR, PROTIME in the last 168 hours.  Recent Results (from the past 240 hour(s))  SARS CORONAVIRUS 2 (TAT 6-24 HRS) Nasopharyngeal Nasopharyngeal Swab     Status: None   Collection Time: 04/17/21 11:48 AM   Specimen: Nasopharyngeal Swab  Result Value Ref Range Status   SARS Coronavirus 2 NEGATIVE NEGATIVE Final    Comment: (NOTE) SARS-CoV-2 target nucleic acids are NOT DETECTED.  The SARS-CoV-2 RNA is generally detectable in upper and lower respiratory specimens during the acute phase of infection. Negative results do not preclude SARS-CoV-2 infection, do not rule out co-infections with other pathogens, and should not be used as the sole basis for treatment or other  patient management decisions. Negative results must be combined with clinical observations, patient history, and epidemiological information. The expected result is Negative.  Fact Sheet for Patients: HairSlick.no  Fact Sheet for Healthcare Providers: quierodirigir.com  This test is not yet approved or cleared by the [place] FDA and  has been authorized for detection and/or diagnosis of SARS-CoV-2 by FDA under an Emergency Use Authorization (EUA). This EUA will remain  in effect (meaning this test can be used) for the duration of the COVID-19 declaration under Se ction 564(b)(1) of the Act, 21 U.S.C. section 360bbb-3(b)(1), unless the authorization is terminated or revoked sooner.  Performed at [facility], [address], [place], [place] [address]          Radiology Studies: No results found.      Scheduled Meds:  amLODipine  10 mg Oral Daily   Chlorhexidine Gluconate Cloth  6 each Topical Daily   docusate sodium  100 mg Oral BID   doxycycline  100 mg Oral BID   enoxaparin (LOVENOX) injection  80 mg Subcutaneous Daily   hydrALAZINE  100 mg Oral Q8H  insulin aspart  0-15 Units Subcutaneous TID WC   insulin aspart  4 Units Subcutaneous TID WC   insulin glargine  24 Units Subcutaneous Daily   metoprolol tartrate  100 mg Oral BID   pantoprazole  40 mg Oral BID   senna  1 tablet Oral BID   Continuous Infusions:  magnesium sulfate bolus IVPB       LOS: 4 days     Jacquelin Hawking, MD [facility] 04/23/2021, 12:26 PM  If 7PM-7AM, please contact night-coverage [email or web address]

## 2021-04-23 NOTE — Progress Notes (Signed)
Physical Therapy Treatment Patient Details Name: Dwayne Rocha MRN: [id number] DOB: [birth year] Today's Date: 04/23/2021    History of Present Illness 45 y/o male with a PMH c/b charcot foot and ankle deformity on the left.  He underwent attempt at tibio calcaneal arthrodesis in April.  His post op course was complicated by infection and hardware failure with recurrent dislocation of the calcaneus. 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  Pt with other significant PMH of CKD, DM, head injury, HTN, neck surgery ,hip surgery, elbow surgery.    PT Comments    Pt demonstrating mobility progression, able to perform hop-to gait x2 short room distances. Pt complaining of moderate residual limb and phantom limb pain, no worse with mobility. Pt overall requiring light physical assist to perform mobility tasks, PT now recommending ST-SNF as pt's wife cannot physically assist him if needed given chronic back problems. Pt also expressing desire to be as independent as possible prior to d/c home. PT to continue to follow acutely.    Follow Up Recommendations  SNF     Equipment Recommendations  Wheelchair (measurements PT);Wheelchair cushion (measurements PT) (20 x 20)    Recommendations for Other Services       Precautions / Restrictions Precautions Precautions: Fall Precaution Comments: limb gaurd L LE - keeps knee in extension, to be worn at all times Restrictions Weight Bearing Restrictions: Yes LUE Weight Bearing: Non weight bearing    Mobility  Bed Mobility Overal bed mobility: Needs Assistance Bed Mobility: Supine to Sit;Sit to Supine     Supine to sit: Supervision Sit to supine: Supervision   General bed mobility comments: for safety, very elevated HOB    Transfers Overall transfer level: Needs assistance Equipment used: Rolling walker (2 wheeled) Transfers: Sit to/from Stand Sit to Stand: Min assist;From elevated surface         General transfer comment: min assist for rise, steady,  verbal cuing for hand placement when rising/sitting  Ambulation/Gait Ambulation/Gait assistance: Min assist Gait Distance (Feet): 10 Feet Assistive device: Rolling walker (2 wheeled) Gait Pattern/deviations: Step-to pattern;Trunk flexed Gait velocity: decr   General Gait Details: Hop-to gait, min assist to steady pt and RW, help pt navigate pivotal steps for directional change. Standing rest break at 5 ft ambulation   Stairs             Wheelchair Mobility    Modified Rankin (Stroke Patients Only)       Balance Overall balance assessment: Needs assistance Sitting-balance support: Feet supported;No upper extremity supported Sitting balance-[name] Scale: Good       Standing balance-[name] Scale: Poor Standing balance comment: reliant on external assist                            Cognition Arousal/Alertness: Awake/alert Behavior During Therapy: Flat affect Overall Cognitive Status: Impaired/Different from baseline Area of Impairment: Safety/judgement;Problem solving                         Safety/Judgement: Decreased awareness of deficits;Decreased awareness of safety   Problem Solving: Requires verbal cues;Requires tactile cues General Comments: cues for safety throughout      Exercises Amputee Exercises Hip ABduction/ADduction: AAROM;Left;10 reps;Supine Straight Leg Raises: AROM;Left;10 reps;Supine (LLE in extension in limb protector; glute set at bottom of range of motion)    General Comments        Pertinent Vitals/Pain Pain Assessment: Faces Faces Pain Scale: Hurts little more  Pain Location: residual limb Pain Descriptors / Indicators: Discomfort;Grimacing;Sore Pain Intervention(s): Limited activity within patient's tolerance;Monitored during session;Repositioned    Home Living                      Prior Function            PT Goals (current goals can now be found in the care plan section) Acute Rehab PT  Goals Patient Stated Goal: go home PT Goal Formulation: With patient Time For Goal Achievement: 05/04/21 Potential to Achieve Goals: Fair Progress towards PT goals: Progressing toward goals    Frequency    Min 3X/week      PT Plan Discharge plan needs to be updated    Co-evaluation              AM-PAC PT "6 Clicks" Mobility   Outcome Measure  Help needed turning from your back to your side while in a flat bed without using bedrails?: A Little Help needed moving from lying on your back to sitting on the side of a flat bed without using bedrails?: A Little Help needed moving to and from a bed to a chair (including a wheelchair)?: A Little Help needed standing up from a chair using your arms (e.g., wheelchair or bedside chair)?: A Little Help needed to walk in hospital room?: A Little Help needed climbing 3-5 steps with a railing? : Total 6 Click Score: 16    End of Session Equipment Utilized During Treatment: Gait belt Activity Tolerance: Patient tolerated treatment well Patient left: in bed;with call bell/phone within reach;with bed alarm set Nurse Communication: Mobility status PT Visit Diagnosis: Muscle weakness (generalized) (M62.81);Difficulty in walking, not elsewhere classified (R26.2);Pain Pain - Right/Left: Left Pain - part of body: Leg     Time: 6440-3474 PT Time Calculation (min) (ACUTE ONLY): 26 min  Charges:  $Gait Training: 8-22 mins $Therapeutic Activity: 8-22 mins                     Marye Round, PT DPT Acute Rehabilitation Services Pager [phone]  Office [phone]    Keatyn Luck E Christain [place] 04/23/2021, 11:51 AM

## 2021-04-23 NOTE — Progress Notes (Signed)
Subjective: 4 Days Post-Op Procedure(s) (LRB): AMPUTATION BELOW KNEE (Left)  Patient reports pain as mild.  Tolerating POs well.  + BM.  Notes that he is compliant utilizing stump protector.  Ready for SNF placement.  Objective:   VITALS:  Temp:  [98.4 F (36.9 C)-99.2 F (37.3 C)] 98.4 F (36.9 C) (06/13 0502) Pulse Rate:  [76-85] 76 (06/13 0502) Resp:  [17-18] 17 (06/13 0502) BP: (135-152)/(67-77) 152/76 (06/13 0502) SpO2:  [97 %-100 %] 99 % (06/13 0502) Weight:  [170.9 kg] 170.9 kg (06/12 0900)  General: WDWN patient in NAD. Psych:  Appropriate mood and affect. Neuro:  A&O x 3, Moving all extremities, sensation intact to light touch HEENT:  EOMs intact Chest:  Even non-labored respirations Skin:  Stump protector/dressing C/D/I, no rashes or lesions Extremities: warm/dry, no edema, erythema or echymosis.  No lymphadenopathy. Pulses: Popliteus 2+ MSK:  ROM: TKE, MMT: able to perform quad set   LABS Recent Labs    04/21/21 0633  HGB 8.8*  WBC 6.7  PLT 190   Recent Labs    04/21/21 0633 04/22/21 0854  NA 135 135  K 4.2 4.3  CL 105 106  CO2 23 22  BUN 24* 21*  CREATININE 2.89* 1.87*  GLUCOSE 141* 166*   No results for input(s): LABPT, INR in the last 72 hours.   Assessment/Plan: 4 Days Post-Op Procedure(s) (LRB): AMPUTATION BELOW KNEE (Left)  NWB L LE Stump protector at all times. Appreciate Medicine team's assistance with patient. DVT ppx:  Lovenox in house:  resume [facility] upon D/C D/C scripts on chart. Disp: SNF, awaiting bed Please inform me when patient is ready to go and I'll place  D/C order.   Alfredo Martinez PA-C [facility] Office:  [phone]
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
## 2021-04-24 LAB — GLUCOSE, CAPILLARY
Glucose-Capillary: 108 mg/dL — ABNORMAL HIGH (ref 70–99)
Glucose-Capillary: 192 mg/dL — ABNORMAL HIGH (ref 70–99)
Glucose-Capillary: 93 mg/dL (ref 70–99)
Glucose-Capillary: 131 mg/dL — ABNORMAL HIGH (ref 70–99)

## 2021-04-24 MED ORDER — HYDROCODONE-ACETAMINOPHEN 5-325 MG PO TABS
1.0000 | ORAL_TABLET | ORAL | Status: DC | PRN
  Administered 2021-04-24 – 2021-04-26 (×6): 2 via ORAL
  Filled 2021-04-24 (×6): qty 2

## 2021-04-24 NOTE — Progress Notes (Signed)
Subjective: 5 Days Post-Op Procedure(s) (LRB): AMPUTATION BELOW KNEE (Left)  Called and spoke to patient on the phone.  Patient reports pain as mild. States that the oxycodone was making him nauseous.   Hydrocodone seems to be working better.  He is working well with therapy.  Notes that he is ready for SNF placement.  Objective:   VITALS:  Temp:  [97.7 F (36.5 C)-99.4 F (37.4 C)] 98.1 F (36.7 C) (06/14 0820) Pulse Rate:  [76-85] 80 (06/14 0820) Resp:  [15-18] 18 (06/14 0820) BP: (124-178)/(62-89) 172/85 (06/14 0820) SpO2:  [96 %-100 %] 96 % (06/14 0820)     LABS No results for input(s): HGB, WBC, PLT in the last 72 hours. Recent Labs    04/22/21 0854  NA 135  K 4.3  CL 106  CO2 22  BUN 21*  CREATININE 1.87*  GLUCOSE 166*   No results for input(s): LABPT, INR in the last 72 hours.   Assessment/Plan: 5 Days Post-Op Procedure(s) (LRB): AMPUTATION BELOW KNEE (Left)  NWB L LE Up with therapy D/C scripts on chart Disp:  SNF when bed available Plan for 2-3 week outpatient post-op visit Please contact me when patient is ready for D/C and I'll place order.  Alfredo Martinez PA-C [facility] Office:  [phone]

## 2021-04-24 NOTE — Progress Notes (Signed)
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  He underwent attempt at tibio calcaneal arthrodesis in April.  His post op course was complicated by infection and hardware failure with recurrent dislocation of the calcaneus. 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  Pt with other significant PMH of CKD, DM, head injury, HTN, neck surgery ,hip surgery, elbow surgery.   OT comments  Dwayne Rocha is progressing well. Upon arrival, he was initially refusing stating that he has "moved enough today," after transferring to bed BSC and washing up this morning with set up A per report. Pt agreed to work on functional mobility: supervision for bed, min guard for sit<>stand and functional room distance ambulation with rw.  Pt refused to sit in recliner for lunch and opted to get back into bed. Spent time educating pt on importance of frequent mobility, OOB transitions, and sitting in chiar. Pt continues to benefit from OT acutely. D/c updated to SNF as pt has decided to go to SNF to get more rehab post acute care to progress independence.    Follow Up Recommendations  SNF;Supervision - Intermittent    Equipment Recommendations  Other (comment);Wheelchair (measurements OT)       Precautions / Restrictions Precautions Precautions: Fall Precaution Comments: limb gaurd L LE - keeps knee in extension, to be worn at all times Restrictions Weight Bearing Restrictions: Yes LUE Weight Bearing: Non weight bearing       Mobility Bed Mobility Overal bed mobility: Needs Assistance Bed Mobility: Supine to Sit;Sit to Supine     Supine to sit: Supervision Sit to supine: Supervision   General bed mobility comments: for safety, very elevated HOB    Transfers Overall transfer level: Needs assistance Equipment used: Rolling walker (2  wheeled) Transfers: Sit to/from Stand Sit to Stand: Min guard;From elevated surface         General transfer comment: min guard with use of bed rail & elevated surface    Balance Overall balance assessment: Needs assistance Sitting-balance support: Feet supported;No upper extremity supported Sitting balance-[name] Scale: Good     Standing balance support: Bilateral upper extremity supported Standing balance-[name] Scale: Poor Standing balance comment: reliant on external assist                 ADL either performed or assessed with clinical judgement   ADL Overall ADL's : Needs assistance/impaired               Functional mobility during ADLs: Min guard;Rolling walker General ADL Comments: Pt refused to complete or simulate ADLs this session     Cognition Arousal/Alertness: Awake/alert Behavior During Therapy: WFL for tasks assessed/performed;Agitated Overall Cognitive Status: Impaired/Different from baseline Area of Impairment: Safety/judgement;Problem solving         Following Commands: Follows multi-step commands consistently Safety/Judgement: Decreased awareness of safety;Decreased awareness of deficits   Problem Solving: Requires verbal cues General Comments: cues for safety throughout              General Comments pt expressed many frustrations this session, and quite argumenative with any offer of assistance    Pertinent Vitals/ Pain       Pain Assessment: Faces Faces Pain Scale: Hurts a little bit Pain Location: residual limb Pain Descriptors / Indicators: Discomfort;Grimacing;Sore Pain Intervention(s): Limited activity within patient's tolerance;Monitored during session  Frequency  Min  2X/week        Progress Toward Goals  OT Goals(current goals can now be found in the care plan section)  Progress towards OT goals: Progressing toward goals  Acute Rehab OT Goals Patient Stated Goal: go home OT Goal Formulation: With patient Time  For Goal Achievement: 05/04/21 ADL Goals Pt Will Perform Grooming: with supervision;with set-up;standing Pt Will Perform Lower Body Bathing: with min guard assist;with supervision;with set-up;sitting/lateral leans Pt Will Perform Lower Body Dressing: with min guard assist;with supervision;with set-up;sitting/lateral leans Pt Will Transfer to Toilet: with supervision;with modified independence;ambulating Pt Will Perform Toileting - Clothing Manipulation and hygiene: with supervision;sit to/from stand  Plan Discharge plan needs to be updated       AM-PAC OT "6 Clicks" Daily Activity     Outcome Measure   Help from another person eating meals?: None Help from another person taking care of personal grooming?: A Little Help from another person toileting, which includes using toliet, bedpan, or urinal?: A Little Help from another person bathing (including washing, rinsing, drying)?: A Little Help from another person to put on and taking off regular upper body clothing?: None Help from another person to put on and taking off regular lower body clothing?: A Little 6 Click Score: 20    End of Session Equipment Utilized During Treatment: Rolling walker;Other (comment)  OT Visit Diagnosis: Unsteadiness on feet (R26.81);Other abnormalities of gait and mobility (R26.89)   Activity Tolerance Patient tolerated treatment well   Patient Left in bed;with call bell/phone within reach;with family/visitor present   Nurse Communication Mobility status        Time: 0923-3007 OT Time Calculation (min): 13 min  Charges: OT General Charges $OT Visit: 1 Visit OT Treatments $Therapeutic Activity: 8-22 mins    Jameire Kouba A Tarron Krolak 04/24/2021, 12:21 PM

## 2021-04-24 NOTE — Progress Notes (Signed)
PROGRESS NOTE    Dwayne Rocha  ONG:[id number] DOB: July 13, 1976 DOA: 04/19/2021 PCP: Fleet Contras, MD   Brief Narrative: Dwayne Rocha is a 45 y.o. male with a history of diabetes mellitus, charcot foot/ankle deformity, hypertension, morbid obesity, CKD. Patient presented secondary to need for left BKA for charcot foot. Medicine service consulted for patient's chronic medical diagnoses.   Assessment & Plan:   Principal Problem:   Below-knee amputation of left lower extremity (HCC) Active Problems:   Essential hypertension   Uncontrolled type 2 diabetes mellitus with hyperglycemia (HCC)   Charcot's joint of ankle, left   Sleep apnea   Morbid obesity with BMI of 50.0-59.9, adult (HCC)   Stage 3a chronic kidney disease (HCC)   Left BKA Secondary to history of charcot foot which failed multiple corrective attempts. Management per orthopedic surgery.  AKI on CKD stage IIIa Baseline creatinine of 1.6-1.7. Creatinine up to 2.89 this admission. In setting of recent surgery. No significant hypotension noted during surgery. Patient is on lisinopril which was transitioned to irbesartan starting 6/10. Urine output diminished from last 24 hours but unsure of accuracy. Good urine output in last 24 hours. Renal ultrasound limited but did not show evidence of hydronephrosis. Overall patient appears volume up but this is chronic. Improved with a creatinine down to 1.87 -Recommend outpatient BMP in 5-7 days post-discharge -Adjust medications for renal function -Strict in/out in addition to daily weights  Gross hematuria Ruled out. Noted on urinalysis report, however patient states he has never noticed hematuria. Repeat urinalysis without hematuria. Possible error.  Primary hypertension Patient is on amlodipine, hydralazine, metoprolol tartrate, olmesartan as an outpatient. Blood pressure elevated while inpatient. -Continue amlodipine, hydralazine, metoprolol tartrate -Recommend to  discontinue olmesartan on discharge pending stabilization of creatinine as an outpatient.  Diabetes mellitus, type 2 Appears to be well controlled. Hemoglobin A1C of 6.6%. Patient is on an insulin pump as an outpatient which was initially resumed inpatient. Diabetes coordinator on board. Patient decided to discontinue insulin pump and patient transitioned to Lantus/SSI/Novolog meal coverage. Patient has a basal Novolog rate of 1 unit/hour via his pump -Continue Lantus 24 units, SSI, Novolog 4 units TID with meals  OSA on CPAP Documented, however patient states that he does not use CPAP at home.  Morbid obesity Body mass index is 52.55 kg/m.  DVT prophylaxis: Per primary Code Status:   Code Status: Full Code Family Communication: None at bedside Disposition Plan: Per primary   Consultants:  General medicine  Procedures:  LEFT BKA (04/19/2021)  Antimicrobials: None    Subjective: No issues. Urinating well.   Objective: Vitals:   04/23/21 1520 04/23/21 2031 04/24/21 0341 04/24/21 0820  BP: 124/62 (!) 178/83 (!) 173/89 (!) 172/85  Pulse: 84 85 76 80  Resp: 15 16 17 18   Temp: 99.4 F (37.4 C) 97.7 F (36.5 C) 98.6 F (37 C) 98.1 F (36.7 C)  TempSrc:  Oral Oral Oral  SpO2: 98% 99% 100% 96%  Weight:      Height:        Intake/Output Summary (Last 24 hours) at 04/24/2021 1340 Last data filed at 04/24/2021 1021 Gross per 24 hour  Intake 360 ml  Output 3000 ml  Net -2640 ml    Filed Weights   04/19/21 0719 04/22/21 0900  Weight: (!) 170.1 kg (!) 170.9 kg    Examination:  General exam: Appears calm and comfortable Respiratory system: Clear to auscultation. Respiratory effort normal. Cardiovascular system: S1 & S2 heard, RRR. No murmurs,  rubs, gallops or clicks. Gastrointestinal system: Obese. Abdomen is nondistended, soft and nontender. No organomegaly or masses felt. Normal bowel sounds heard. Central nervous system: Alert and oriented. No focal neurological  deficits. Musculoskeletal: Left BKA. No calf tenderness Skin: No cyanosis. No rashes Psychiatry: Judgement and insight appear normal. Mood & affect appropriate.     Data Reviewed: I have personally reviewed following labs and imaging studies  CBC Lab Results  Component Value Date   WBC 6.7 04/21/2021   RBC 3.09 (L) 04/21/2021   HGB 8.8 (L) 04/21/2021   HCT 27.7 (L) 04/21/2021   MCV 89.6 04/21/2021   MCH 28.5 04/21/2021   PLT 190 04/21/2021   MCHC 31.8 04/21/2021   RDW 15.1 04/21/2021   LYMPHSABS 1.1 02/20/2021   MONOABS 0.8 02/20/2021   EOSABS 0.2 02/20/2021   BASOSABS 0.0 02/20/2021     Last metabolic panel Lab Results  Component Value Date   NA 135 04/22/2021   K 4.3 04/22/2021   CL 106 04/22/2021   CO2 22 04/22/2021   BUN 21 (H) 04/22/2021   CREATININE 1.87 (H) 04/22/2021   GLUCOSE 166 (H) 04/22/2021   GFRNONAA 45 (L) 04/22/2021   GFRAA 56 (L) 01/04/2021   CALCIUM 8.2 (L) 04/22/2021   PHOS 3.1 02/20/2021   PROT 6.1 (L) 02/22/2021   ALBUMIN 2.1 (L) 02/22/2021   BILITOT 0.6 02/22/2021   ALKPHOS 80 02/22/2021   AST 21 02/22/2021   ALT 14 02/22/2021   ANIONGAP 7 04/22/2021    CBG (last 3)  Recent Labs    04/23/21 2030 04/24/21 0632 04/24/21 1125  GLUCAP 119* 108* 192*      GFR: Estimated Creatinine Clearance: 81.8 mL/min (A) (by C-G formula based on SCr of 1.87 mg/dL (H)).  Coagulation Profile: No results for input(s): INR, PROTIME in the last 168 hours.  Recent Results (from the past 240 hour(s))  SARS CORONAVIRUS 2 (TAT 6-24 HRS) Nasopharyngeal Nasopharyngeal Swab     Status: None   Collection Time: 04/17/21 11:48 AM   Specimen: Nasopharyngeal Swab  Result Value Ref Range Status   SARS Coronavirus 2 NEGATIVE NEGATIVE Final    Comment: (NOTE) SARS-CoV-2 target nucleic acids are NOT DETECTED.  The SARS-CoV-2 RNA is generally detectable in upper and lower respiratory specimens during the acute phase of infection. Negative results do not  preclude SARS-CoV-2 infection, do not rule out co-infections with other pathogens, and should not be used as the sole basis for treatment or other patient management decisions. Negative results must be combined with clinical observations, patient history, and epidemiological information. The expected result is Negative.  Fact Sheet for Patients: HairSlick.no  Fact Sheet for Healthcare Providers: quierodirigir.com  This test is not yet approved or cleared by the [place] FDA and  has been authorized for detection and/or diagnosis of SARS-CoV-2 by FDA under an Emergency Use Authorization (EUA). This EUA will remain  in effect (meaning this test can be used) for the duration of the COVID-19 declaration under Se ction 564(b)(1) of the Act, 21 U.S.C. section 360bbb-3(b)(1), unless the authorization is terminated or revoked sooner.  Performed at [facility], [address], [place], [place] [address]          Radiology Studies: No results found.      Scheduled Meds:  amLODipine  10 mg Oral Daily   Chlorhexidine Gluconate Cloth  6 each Topical Daily   docusate sodium  100 mg Oral BID   enoxaparin (LOVENOX) injection  80 mg  Subcutaneous Daily   hydrALAZINE  100 mg Oral Q8H   insulin aspart  0-15 Units Subcutaneous TID WC   insulin aspart  4 Units Subcutaneous TID WC   insulin glargine  24 Units Subcutaneous Daily   metoprolol tartrate  100 mg Oral BID   pantoprazole  40 mg Oral BID   senna  1 tablet Oral BID   Continuous Infusions:  magnesium sulfate bolus IVPB       LOS: 5 days     Jacquelin Hawking, MD [facility] 04/24/2021, 1:40 PM  If 7PM-7AM, please contact night-coverage [email or web address]

## 2021-04-24 NOTE — TOC Progression Note (Signed)
Transition of Care [facility]) - Progression Note    Patient Details  Name: Dwayne Rocha MRN: [id number] Date of Birth: 12-09-1975  [facility] CM/SW Contact  Epifanio Lesches, RN Phone Number: 04/24/2021, 4:34 PM  Clinical Narrative:    NCM shared one noted SNF bed acceptance with pt, [facility]. [facility] extended bed offer and pt accepted. Insurance authorization pending .... initiated by SNF.  TOC team will continue to monitor and assist with needs....   Expected Discharge Plan: Skilled Nursing Facility Barriers to Discharge: No SNF bed, Insurance Authorization  Expected Discharge Plan and Services Expected Discharge Plan: Skilled Nursing Facility   Discharge Planning Services: CM Consult   Living arrangements for the past 2 months: Single Family Home                                       Social Determinants of Health (SDOH) Interventions    Readmission Risk Interventions No flowsheet data found.

## 2021-04-25 LAB — GLUCOSE, CAPILLARY
Glucose-Capillary: 119 mg/dL — ABNORMAL HIGH (ref 70–99)
Glucose-Capillary: 128 mg/dL — ABNORMAL HIGH (ref 70–99)
Glucose-Capillary: 145 mg/dL — ABNORMAL HIGH (ref 70–99)
Glucose-Capillary: 121 mg/dL — ABNORMAL HIGH (ref 70–99)
Glucose-Capillary: 133 mg/dL — ABNORMAL HIGH (ref 70–99)

## 2021-04-25 MED ORDER — HYDROCODONE-ACETAMINOPHEN 5-325 MG PO TABS: 1.0000 | ORAL_TABLET | ORAL | 0 refills | Status: AC | PRN

## 2021-04-25 NOTE — Progress Notes (Signed)
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  He underwent attempt at tibio calcaneal arthrodesis in April.  His post op course was complicated by infection and hardware failure with recurrent dislocation of the calcaneus. 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  Pt with other significant PMH of CKD, DM, head injury, HTN, neck surgery ,hip surgery, elbow surgery.    PT Comments    Pt demonstrating slow,steady progress with mobility, tolerating increased hop-to gait and BKA exercises. Pt expresses desire to d/c home, if so PT recommending HHPT. PT notified [facility] for need of w/c, tub bench, BSC. Will continue to follow.   Follow Up Recommendations  Home health PT;Supervision for mobility/OOB     Equipment Recommendations  Wheelchair (measurements PT);Wheelchair cushion (measurements PT) (20 x 20)    Recommendations for Other Services       Precautions / Restrictions Precautions Precautions: Fall Precaution Comments: limb gaurd L LE - keeps knee in extension, to be worn at all times Restrictions Weight Bearing Restrictions: Yes LUE Weight Bearing: Non weight bearing    Mobility  Bed Mobility Overal bed mobility: Needs Assistance Bed Mobility: Supine to Sit;Sit to Supine     Supine to sit: Supervision Sit to supine: Supervision   General bed mobility comments: for safety, very elevated HOB    Transfers Overall transfer level: Needs assistance Equipment used: Rolling walker (2 wheeled) Transfers: Sit to/from Stand Sit to Stand: Min guard;From elevated surface         General transfer comment: for safety, verbal cuing for hand placement when rising/sitting.  Ambulation/Gait Ambulation/Gait assistance: Min guard;Min assist Gait Distance (Feet): 15 Feet Assistive device: Rolling walker (2 wheeled) Gait  Pattern/deviations: Step-to pattern;Decreased stride length;Trunk flexed Gait velocity: decr   General Gait Details: min guard for safety, x1 period of min assist to steady as pt took too big of a hop-step. Verbal cuing for upright posture, placement in RW; pt with less of a hop and more of a scoot with LLE with fatigue.   Stairs             Wheelchair Mobility    Modified Rankin (Stroke Patients Only)       Balance Overall balance assessment: Needs assistance Sitting-balance support: Feet supported;No upper extremity supported Sitting balance-[name] Scale: Good     Standing balance support: Bilateral upper extremity supported Standing balance-[name] Scale: Poor Standing balance comment: reliant on external assist                            Cognition Arousal/Alertness: Awake/alert Behavior During Therapy: WFL for tasks assessed/performed Overall Cognitive Status: Impaired/Different from baseline Area of Impairment: Safety/judgement;Problem solving                         Safety/Judgement: Decreased awareness of deficits;Decreased awareness of safety   Problem Solving: Requires verbal cues        Exercises Amputee Exercises Hip ABduction/ADduction: AAROM;Left;Supine;15 reps Straight Leg Raises: AROM;Left;Supine;15 reps (LLE in extension in limb protector; glute set at bottom of range of motion)    General Comments        Pertinent Vitals/Pain Pain Assessment: Faces Faces Pain Scale: Hurts little more Pain Location: residual limb Pain Descriptors / Indicators: Discomfort;Grimacing;Sore Pain Intervention(s): Limited activity within  patient's tolerance;Monitored during session;Repositioned    Home Living                      Prior Function            PT Goals (current goals can now be found in the care plan section) Acute Rehab PT Goals Patient Stated Goal: go home PT Goal Formulation: With patient Time For Goal  Achievement: 05/04/21 Potential to Achieve Goals: Fair Progress towards PT goals: Progressing toward goals    Frequency    Min 3X/week      PT Plan Discharge plan needs to be updated    Co-evaluation              AM-PAC PT "6 Clicks" Mobility   Outcome Measure  Help needed turning from your back to your side while in a flat bed without using bedrails?: A Little Help needed moving from lying on your back to sitting on the side of a flat bed without using bedrails?: A Little Help needed moving to and from a bed to a chair (including a wheelchair)?: A Little Help needed standing up from a chair using your arms (e.g., wheelchair or bedside chair)?: A Little Help needed to walk in hospital room?: A Little Help needed climbing 3-5 steps with a railing? : A Lot 6 Click Score: 17    End of Session Equipment Utilized During Treatment: Gait belt;Other (comment) (L limb guard) Activity Tolerance: Patient tolerated treatment well Patient left: in bed;with call bell/phone within reach;with bed alarm set Nurse Communication: Mobility status PT Visit Diagnosis: Muscle weakness (generalized) (M62.81);Difficulty in walking, not elsewhere classified (R26.2);Pain Pain - Right/Left: Left Pain - part of body: Leg     Time: 1205-1225 PT Time Calculation (min) (ACUTE ONLY): 20 min  Charges:  $Gait Training: 8-22 mins                     Marye Round, PT DPT Acute Rehabilitation Services Pager [phone]  Office [phone]    [name] 04/25/2021, 3:46 PM

## 2021-04-25 NOTE — Progress Notes (Signed)
Subjective: 6 Days Post-Op Procedure(s) (LRB): AMPUTATION BELOW KNEE (Left)  Patient reports pain as mild.  Resting comfortably in bed.  Reports that he is doing better on the hydrocodone vs oxycodone.  Working well with therapy.  Objective:   VITALS:  Temp:  [98.1 F (36.7 C)-99 F (37.2 C)] 99 F (37.2 C) (06/14 2112) Pulse Rate:  [75-84] 75 (06/15 0637) Resp:  [18] 18 (06/14 1625) BP: (123-173)/(66-87) 173/87 (06/15 0637) SpO2:  [96 %-100 %] 98 % (06/14 2112)  General: WDWN patient in NAD. Psych:  Appropriate mood and affect. Neuro:  A&O x 3, Moving all extremities, sensation intact to light touch HEENT:  EOMs intact Chest:  Even non-labored respirations Skin:  Stump protector C/D/I, no rashes or lesions Extremities: warm/dry, no visible edema, erythema or echymosis.  No lymphadenopathy. Pulses: Femoral 2+ MSK:  ROM: TKE, MMT: able to perform quad set   LABS No results for input(s): HGB, WBC, PLT in the last 72 hours. Recent Labs    04/22/21 0854  NA 135  K 4.3  CL 106  CO2 22  BUN 21*  CREATININE 1.87*  GLUCOSE 166*   No results for input(s): LABPT, INR in the last 72 hours.   Assessment/Plan: 6 Days Post-Op Procedure(s) (LRB): AMPUTATION BELOW KNEE (Left)  NWB L LE Stump protector on at all times. Up with therapy Changed D/C script changed from oxycodone to hydrocodone D/C scripts on chart. Disp:  SNF Please inform me when patient is ready for D/C  Joana Reamer [facility] Office:  [phone]

## 2021-04-25 NOTE — Progress Notes (Signed)
PROGRESS NOTE    Dwayne Rocha  MWU:[id number] DOB: 03-19-1976 DOA: 04/19/2021 PCP: Fleet Contras, MD   Brief Narrative: Dwayne Rocha is a 45 y.o. male with a history of diabetes mellitus, charcot foot/ankle deformity, hypertension, morbid obesity, CKD. Patient presented secondary to need for left BKA for charcot foot. Medicine service consulted for patient's chronic medical diagnoses.   Assessment & Plan:   Principal Problem:   Below-knee amputation of left lower extremity (HCC) Active Problems:   Essential hypertension   Uncontrolled type 2 diabetes mellitus with hyperglycemia (HCC)   Charcot's joint of ankle, left   Sleep apnea   Morbid obesity with BMI of 50.0-59.9, adult (HCC)   Stage 3a chronic kidney disease (HCC)   Left BKA Secondary to history of charcot foot which failed multiple corrective attempts. Management per orthopedic surgery.  AKI on CKD stage IIIa Baseline creatinine of 1.6-1.7. Creatinine up to 2.89 this admission. In setting of recent surgery. No significant hypotension noted during surgery. Patient is on lisinopril which was transitioned to irbesartan starting 6/10. Urine output diminished from last 24 hours but unsure of accuracy. Good urine output in last 24 hours. Renal ultrasound limited but did not show evidence of hydronephrosis. Overall patient appears volume up but this is chronic. Improved with a creatinine down to 1.87 -Recommend outpatient BMP in 5-7 days post-discharge -Adjust medications for renal function -Strict in/out in addition to daily weights  Gross hematuria Ruled out. Noted on urinalysis report, however patient states he has never noticed hematuria. Repeat urinalysis without hematuria. Possible error.  Primary hypertension Patient is on amlodipine, hydralazine, metoprolol tartrate, olmesartan as an outpatient. Blood pressure elevated while inpatient. -Continue amlodipine, hydralazine, metoprolol tartrate -Recommend to  discontinue olmesartan on discharge pending stabilization of creatinine as an outpatient; can be restarted as an outpatient per PCP  Diabetes mellitus, type 2 Appears to be well controlled. Hemoglobin A1C of 6.6%. Patient is on an insulin pump as an outpatient which was initially resumed inpatient. Diabetes coordinator on board. Patient decided to discontinue insulin pump and patient transitioned to Lantus/SSI/Novolog meal coverage. Patient has a basal Novolog rate of 1 unit/hour via his pump -Continue Lantus 24 units, SSI, Novolog 4 units TID with meals  OSA on CPAP Documented, however patient states that he does not use CPAP at home.  Morbid obesity Body mass index is 52.55 kg/m.  DVT prophylaxis: Per primary Code Status:   Code Status: Full Code Family Communication: None at bedside Disposition Plan: Per primary   Consultants:  General medicine  Procedures:  LEFT BKA (04/19/2021)  Antimicrobials: None    Subjective: No issues overnight.  Objective: Vitals:   04/24/21 1625 04/24/21 2112 04/25/21 0637 04/25/21 0756  BP: 123/66 (!) 160/73 (!) 173/87 (!) 153/79  Pulse: 76 84 75 83  Resp: 18   17  Temp: 98.6 F (37 C) 99 F (37.2 C)  98.3 F (36.8 C)  TempSrc: Oral Oral    SpO2: 100% 98%  96%  Weight:      Height:        Intake/Output Summary (Last 24 hours) at 04/25/2021 1027 Last data filed at 04/25/2021 0900 Gross per 24 hour  Intake 720 ml  Output 1350 ml  Net -630 ml    Filed Weights   04/19/21 0719 04/22/21 0900  Weight: (!) 170.1 kg (!) 170.9 kg    Examination:  General exam: Appears calm and comfortable Respiratory system: Clear to auscultation. Respiratory effort normal. Cardiovascular system: S1 & S2 heard,  RRR. No murmurs, rubs, gallops or clicks. Gastrointestinal system: Obese. Abdomen is nondistended, soft and nontender. No organomegaly or masses felt. Normal bowel sounds heard. Central nervous system: Alert and oriented. No focal neurological  deficits. Musculoskeletal: Left BKA. No calf tenderness Skin: No cyanosis. No rashes Psychiatry: Judgement and insight appear normal. Mood & affect appropriate.     Data Reviewed: I have personally reviewed following labs and imaging studies  CBC Lab Results  Component Value Date   WBC 6.7 04/21/2021   RBC 3.09 (L) 04/21/2021   HGB 8.8 (L) 04/21/2021   HCT 27.7 (L) 04/21/2021   MCV 89.6 04/21/2021   MCH 28.5 04/21/2021   PLT 190 04/21/2021   MCHC 31.8 04/21/2021   RDW 15.1 04/21/2021   LYMPHSABS 1.1 02/20/2021   MONOABS 0.8 02/20/2021   EOSABS 0.2 02/20/2021   BASOSABS 0.0 02/20/2021     Last metabolic panel Lab Results  Component Value Date   NA 135 04/22/2021   K 4.3 04/22/2021   CL 106 04/22/2021   CO2 22 04/22/2021   BUN 21 (H) 04/22/2021   CREATININE 1.87 (H) 04/22/2021   GLUCOSE 166 (H) 04/22/2021   GFRNONAA 45 (L) 04/22/2021   GFRAA 56 (L) 01/04/2021   CALCIUM 8.2 (L) 04/22/2021   PHOS 3.1 02/20/2021   PROT 6.1 (L) 02/22/2021   ALBUMIN 2.1 (L) 02/22/2021   BILITOT 0.6 02/22/2021   ALKPHOS 80 02/22/2021   AST 21 02/22/2021   ALT 14 02/22/2021   ANIONGAP 7 04/22/2021    CBG (last 3)  Recent Labs    04/24/21 2007 04/25/21 0611 04/25/21 0755  GLUCAP 131* 119* 128*      GFR: Estimated Creatinine Clearance: 81.8 mL/min (A) (by C-G formula based on SCr of 1.87 mg/dL (H)).  Coagulation Profile: No results for input(s): INR, PROTIME in the last 168 hours.  Recent Results (from the past 240 hour(s))  SARS CORONAVIRUS 2 (TAT 6-24 HRS) Nasopharyngeal Nasopharyngeal Swab     Status: None   Collection Time: 04/17/21 11:48 AM   Specimen: Nasopharyngeal Swab  Result Value Ref Range Status   SARS Coronavirus 2 NEGATIVE NEGATIVE Final    Comment: (NOTE) SARS-CoV-2 target nucleic acids are NOT DETECTED.  The SARS-CoV-2 RNA is generally detectable in upper and lower respiratory specimens during the acute phase of infection. Negative results do not  preclude SARS-CoV-2 infection, do not rule out co-infections with other pathogens, and should not be used as the sole basis for treatment or other patient management decisions. Negative results must be combined with clinical observations, patient history, and epidemiological information. The expected result is Negative.  Fact Sheet for Patients: HairSlick.no  Fact Sheet for Healthcare Providers: quierodirigir.com  This test is not yet approved or cleared by the [place] FDA and  has been authorized for detection and/or diagnosis of SARS-CoV-2 by FDA under an Emergency Use Authorization (EUA). This EUA will remain  in effect (meaning this test can be used) for the duration of the COVID-19 declaration under Se ction 564(b)(1) of the Act, 21 U.S.C. section 360bbb-3(b)(1), unless the authorization is terminated or revoked sooner.  Performed at [facility], [address] [place], [place] [address]          Radiology Studies: No results found.      Scheduled Meds:  amLODipine  10 mg Oral Daily   Chlorhexidine Gluconate Cloth  6 each Topical Daily   docusate sodium  100 mg Oral BID   enoxaparin (LOVENOX) injection  80 mg Subcutaneous Daily   hydrALAZINE  100 mg Oral Q8H   insulin aspart  0-15 Units Subcutaneous TID WC   insulin aspart  4 Units Subcutaneous TID WC   insulin glargine  24 Units Subcutaneous Daily   metoprolol tartrate  100 mg Oral BID   pantoprazole  40 mg Oral BID   senna  1 tablet Oral BID   Continuous Infusions:  magnesium sulfate bolus IVPB       LOS: 6 days     Jacquelin Hawking, MD [facility] 04/25/2021, 10:27 AM  If 7PM-7AM, please contact night-coverage [email or web address]
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
## 2021-04-25 NOTE — Plan of Care (Signed)

## 2021-04-26 DIAGNOSIS — S88112A Complete traumatic amputation at level between knee and ankle, left lower leg, initial encounter: Secondary | ICD-10-CM

## 2021-04-26 LAB — GLUCOSE, CAPILLARY
Glucose-Capillary: 113 mg/dL — ABNORMAL HIGH (ref 70–99)
Glucose-Capillary: 183 mg/dL — ABNORMAL HIGH (ref 70–99)
Glucose-Capillary: 99 mg/dL (ref 70–99)

## 2021-04-26 NOTE — Plan of Care (Signed)

## 2021-04-26 NOTE — TOC Transition Note (Addendum)
Transition of Care [facility]) - CM/SW Discharge Note   Patient Details  Name: Dwayne Rocha MRN: [id number] Date of Birth: 12-01-1975  Transition of Care [facility]) CM/SW Contact:  Epifanio Lesches, RN Phone Number: 04/26/2021, 1:21 PM   Clinical Narrative:    Patient will DC to: Home  Anticipated DC date: 04/26/2021 Family notified: yes Transport by: car        S/p Below-knee amputation of left lower extremity Per MD patient ready for DC today . RN, patient, patient's family, and [facility] notified of DC.  Pt with DME @ bedside to take home , W/C. Shower chair will be delivered to home by [facility].  Pt without Rx medication concerns or affordability issues.  Post hospital follow up noted on AVS.  Wife to provide transportation to home .  RNCM will sign off for now as intervention is no longer needed. Please consult [place] again if new needs arise.    Final next level of care: Home w Home Health Services Barriers to Discharge: No Barriers Identified   Patient Goals and CMS Choice     Choice offered to / list presented to : Patient  Discharge Placement                       Discharge Plan and Services   Discharge Planning Services: CM Consult            DME Arranged: Lightweight manual wheelchair with seat cushion, Other see comment (shower chair) DME Agency: [facility] Date DME Agency Contacted: 04/26/21 Time DME Agency Contacted: 1029 Representative spoke with at DME Agency: Velna Hatchet HH Arranged: PT HH Agency: [facility] ([facility] Date HH Agency Contacted: 04/26/21 Time HH Agency Contacted: 1029 Representative spoke with at [facility] Agency: Pearson Grippe  Social Determinants of Health (SDOH) Interventions     Readmission Risk Interventions No flowsheet data found.

## 2021-04-26 NOTE — Progress Notes (Signed)
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  He underwent attempt at tibio calcaneal arthrodesis in April.  His post op course was complicated by infection and hardware failure with recurrent dislocation of the calcaneus. 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  Pt with other significant PMH of CKD, DM, head injury, HTN, neck surgery ,hip surgery, elbow surgery.    PT Comments    PT focused session on w/c management and mechanisms, transfer to/from w/c. PT reviewed locking mechanism, removing/replacing armrests, removing/replacing legrests, and positioning RW for safe transfer. Pt performed stand pivot transfer to/from w/c with close guard for safety, pt declining use of RW even with PT encouragement and using SL support from either bed mattress or w/c during pivot. Pt reports no further questions, wishes to d/c home today with assist of family.    Follow Up Recommendations  Home health PT;Supervision for mobility/OOB     Equipment Recommendations  Wheelchair (measurements PT);Wheelchair cushion (measurements PT);3in1 (PT) (20 x 20 wheelchair; tub bench bari)    Recommendations for Other Services       Precautions / Restrictions Precautions Precautions: Fall Precaution Comments: limb gaurd L LE - keeps knee in extension, to be worn at all times Restrictions Weight Bearing Restrictions: Yes LUE Weight Bearing: Non weight bearing    Mobility  Bed Mobility Overal bed mobility: Needs Assistance Bed Mobility: Supine to Sit;Sit to Supine     Supine to sit: Supervision Sit to supine: Supervision   General bed mobility comments: for safety, elevated HOB    Transfers Overall transfer level: Needs assistance Equipment used: None (HHA on railing of w/c, reaching towards bed) Transfers: Sit to/from [employer] Sit to Stand: Min guard;From elevated surface Stand pivot transfers: Min guard       General transfer comment: min guard for safety, verbal cuing for use of RW to stand and pivot but pt declines, stands with SL support from bed when going bed>wc and from wc armrest when going wc>bed. Pt moving LLE in small pivots to transfer, cues to making sure wc is locked prior to transferring to/from it.  Ambulation/Gait             General Gait Details: pt declines   Stairs             Wheelchair Mobility    Modified Rankin (Stroke Patients Only)       Balance Overall balance assessment: Needs assistance Sitting-balance support: Feet supported;No upper extremity supported Sitting balance-[name] Scale: Good     Standing balance support: Bilateral upper extremity supported Standing balance-[name] Scale: Poor Standing balance comment: reliant on external assist                            Cognition Arousal/Alertness: Awake/alert Behavior During Therapy: WFL for tasks assessed/performed Overall Cognitive Status: Impaired/Different from baseline Area of Impairment: Safety/judgement;Problem solving                         Safety/Judgement: Decreased awareness of deficits;Decreased awareness of safety   Problem Solving: Requires verbal cues General Comments: decreased insight into deficits      Exercises Other Exercises Other Exercises: reviewed w/c lock/unlock, removal/replacement of armrests, removal/replacement of legrests.    General Comments  Pertinent Vitals/Pain Pain Assessment: Faces Faces Pain Scale: Hurts little more Pain Location: residual limb Pain Descriptors / Indicators: Discomfort;Grimacing;Sore Pain Intervention(s): Limited activity within patient's tolerance;Monitored during session;Repositioned    Home Living                      Prior Function            PT Goals (current goals can now be found  in the care plan section) Acute Rehab PT Goals Patient Stated Goal: go home PT Goal Formulation: With patient Time For Goal Achievement: 05/04/21 Potential to Achieve Goals: Fair Progress towards PT goals: Progressing toward goals    Frequency    Min 3X/week      PT Plan Current plan remains appropriate    Co-evaluation              AM-PAC PT "6 Clicks" Mobility   Outcome Measure  Help needed turning from your back to your side while in a flat bed without using bedrails?: A Little Help needed moving from lying on your back to sitting on the side of a flat bed without using bedrails?: A Little Help needed moving to and from a bed to a chair (including a wheelchair)?: A Little Help needed standing up from a chair using your arms (e.g., wheelchair or bedside chair)?: A Little Help needed to walk in hospital room?: A Little Help needed climbing 3-5 steps with a railing? : A Lot 6 Click Score: 17    End of Session Equipment Utilized During Treatment: Other (comment) (L limb guard) Activity Tolerance: Patient tolerated treatment well Patient left: in bed;with call bell/phone within reach Nurse Communication: Mobility status PT Visit Diagnosis: Muscle weakness (generalized) (M62.81);Difficulty in walking, not elsewhere classified (R26.2);Pain Pain - Right/Left: Left Pain - part of body: Leg     Time: 8242-3536 PT Time Calculation (min) (ACUTE ONLY): 21 min  Charges:  $Therapeutic Activity: 8-22 mins                     Marye Round, PT DPT Acute Rehabilitation Services Pager [phone]  Office [phone]    Truddie Coco 04/26/2021, 6:26 PM

## 2021-04-26 NOTE — Plan of Care (Signed)
  Problem: Education: Goal: Knowledge of General Education information will improve Description: Including pain rating scale, medication(s)/side effects and non-pharmacologic comfort measures Outcome: Adequate for Discharge   Problem: Health Behavior/Discharge Planning: Goal: Ability to manage health-related needs will improve Outcome: Adequate for Discharge   Problem: Clinical Measurements: Goal: Ability to maintain clinical measurements within normal limits will improve Outcome: Adequate for Discharge Goal: Will remain free from infection Outcome: Adequate for Discharge Goal: Diagnostic test results will improve Outcome: Adequate for Discharge Goal: Respiratory complications will improve Outcome: Adequate for Discharge Goal: Cardiovascular complication will be avoided Outcome: Adequate for Discharge   Problem: Activity: Goal: Risk for activity intolerance will decrease Outcome: Adequate for Discharge   Problem: Nutrition: Goal: Adequate nutrition will be maintained Outcome: Adequate for Discharge   Problem: Coping: Goal: Level of anxiety will decrease Outcome: Adequate for Discharge   Problem: Elimination: Goal: Will not experience complications related to bowel motility Outcome: Adequate for Discharge Goal: Will not experience complications related to urinary retention Outcome: Adequate for Discharge   Problem: Pain Managment: Goal: General experience of comfort will improve Outcome: Adequate for Discharge   Problem: Safety: Goal: Ability to remain free from injury will improve Outcome: Adequate for Discharge   Problem: Skin Integrity: Goal: Risk for impaired skin integrity will decrease Outcome: Adequate for Discharge   Problem: Acute Rehab OT Goals (only OT should resolve) Goal: Pt. Will Perform Grooming Outcome: Adequate for Discharge Goal: Pt. Will Perform Lower Body Bathing Outcome: Adequate for Discharge Goal: Pt. Will Perform Lower Body  Dressing Outcome: Adequate for Discharge Goal: Pt. Will Transfer To Toilet Outcome: Adequate for Discharge Goal: Pt. Will Perform Toileting-Clothing Manipulation Outcome: Adequate for Discharge   Problem: Acute Rehab PT Goals(only PT should resolve) Goal: Pt Will Go Supine/Side To Sit Outcome: Adequate for Discharge Goal: Pt Will Go Sit To Supine/Side Outcome: Adequate for Discharge Goal: Patient Will Transfer Sit To/From Stand Outcome: Adequate for Discharge Goal: Pt Will Transfer Bed To Chair/Chair To Bed Outcome: Adequate for Discharge   Problem: Acute Rehab PT Goals(only PT should resolve) Goal: Pt Will Ambulate Outcome: Adequate for Discharge

## 2021-04-26 NOTE — Progress Notes (Signed)
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  He underwent attempt at tibio calcaneal arthrodesis in April.  His post op course was complicated by infection and hardware failure with recurrent dislocation of the calcaneus. 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  Pt with other significant PMH of CKD, DM, head injury, HTN, neck surgery ,hip surgery, elbow surgery.   OT comments  Pt in bed upon arrival. Pt refused to sit EOB for ADLs/simulated ADLs this session, Verbalized understanding of compensatory LB bathing and dressing techniques leaning to side. Pt also declined transfer to w/c for propulsion trg. Pt hopeful to d/c home today ad had new w/c and DME delivered to his room. Educated pt on home ADL and mobility safety and pt verbalizes understanding  Follow Up Recommendations  SNF;Supervision - Intermittent    Equipment Recommendations  Other (comment);Wheelchair (measurements OT)    Recommendations for Other Services      Precautions / Restrictions Precautions Precautions: Fall Precaution Comments: limb gaurd L LE - keeps knee in extension, to be worn at all times Restrictions Weight Bearing Restrictions: Yes LUE Weight Bearing: Non weight bearing       Mobility Bed Mobility                    Transfers                      Balance                                           ADL either performed or assessed with clinical judgement   ADL Overall ADL's : Needs assistance/impaired                                       General ADL Comments: Pt refused to complete or simulate ADLs this session, Verbalized understanding of compensatory LB bathing and dressing techniques leaning to side. Pt alos declined transfer to w/c for propulsion trg     Vision        Perception     Praxis      Cognition                                                Exercises     Shoulder Instructions       General Comments      Pertinent Vitals/ Pain          Home Living                                          Prior Functioning/Environment              Frequency  Min 2X/week        Progress Toward Goals  OT Goals(current goals can now be found in the care plan section)  Progress towards OT goals: Progressing toward goals  Acute  Rehab OT Goals Patient Stated Goal: go home  Plan Discharge plan needs to be updated    Co-evaluation                 AM-PAC OT "6 Clicks" Daily Activity     Outcome Measure   Help from another person eating meals?: None Help from another person taking care of personal grooming?: A Little Help from another person toileting, which includes using toliet, bedpan, or urinal?: A Little Help from another person bathing (including washing, rinsing, drying)?: A Little Help from another person to put on and taking off regular upper body clothing?: None Help from another person to put on and taking off regular lower body clothing?: A Little 6 Click Score: 20    End of Session    OT Visit Diagnosis: Unsteadiness on feet (R26.81);Other abnormalities of gait and mobility (R26.89)   Activity Tolerance Patient limited by fatigue   Patient Left in bed;with call bell/phone within reach   Nurse Communication          Time: 4580-9983 OT Time Calculation (min): 13 min  Charges: OT General Charges $OT Visit: 1 Visit OT Treatments $Self Care/Home Management : 8-22 mins     Galen Manila 04/26/2021, 3:36 PM

## 2021-04-26 NOTE — TOC Progression Note (Addendum)
Transition of Care [facility]) - Progression Note    Patient Details  Name: Dwayne Rocha MRN: [id number] Date of Birth: Oct 24, 1976  [facility] CM/SW Contact  Epifanio Lesches, RN Phone Number: 04/26/2021, 10:30 AM  Clinical Narrative:    s/p L BKA on 04/19/21  Pt progressed with therapy. Per PT's recommendations: Home health PT;Supervision for mobility/OOB. NCM spoke with pt regading d/c plan home with home health services. Pt agreeable to home health services. Choice offered for agency. Pt without preference. Referral made with [facility] and accepted for PT/OT  services, SOC withi 48 hours post d/c. DME noted, W/C and shower chair...referral made with [facility] and will be delivered to bedside prior to discharge.  Pt without Rx med concerns or affordability issues.  Pt states has transportation to home one d/c.   Expected Discharge Plan: Home w Home Health Services Barriers to Discharge: No SNF bed, Insurance Authorization  Expected Discharge Plan and Services Expected Discharge Plan: Home w Home Health Services   Discharge Planning Services: CM Consult   Living arrangements for the past 2 months: Single Family Home                 DME Arranged: Community education officer wheelchair with seat cushion, Other see comment (shower chair) DME Agency: [facility] Date DME Agency Contacted: 04/26/21 Time DME Agency Contacted: 1029 Representative spoke with at DME Agency: Velna Hatchet HH Arranged: PT HH Agency: [facility] Date HH Agency Contacted: 04/26/21 Time HH Agency Contacted: 1029 Representative spoke with at [facility] Agency: Pearson Grippe   Social Determinants of Health (SDOH) Interventions    Readmission Risk Interventions No flowsheet data found.

## 2021-04-26 NOTE — Progress Notes (Signed)
See progress note from 6/15 Dr. Caleb Popp -Recommend outpatient BMP in 5-7 days post-discharge --Recommend to discontinue olmesartan on discharge pending stabilization of creatinine as an outpatient; can be restarted as an outpatient per PCP  Med rec completed Marlin Canary DO

## 2021-04-26 NOTE — Progress Notes (Deleted)
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  He underwent attempt at tibio calcaneal arthrodesis in April.  His post op course was complicated by infection and hardware failure with recurrent dislocation of the calcaneus. 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  Pt with other significant PMH of CKD, DM, head injury, HTN, neck surgery ,hip surgery, elbow surgery.   OT comments  Pt in bed upon arrival. Pt refused to sit EOB for ADLs/simulated ADLs this session, Verbalized understanding of compensatory LB bathing and dressing techniques leaning to side. Pt also declined transfer to w/c for propulsion trg. Pt hopeful to d/c home today ad had new w/c and DME delivered to his room. Educated pt on home ADL and mobility safety and pt verbalizes understanding  Follow Up Recommendations  SNF;Supervision - Intermittent    Equipment Recommendations  Other (comment);Wheelchair (measurements OT)    Recommendations for Other Services      Precautions / Restrictions Precautions Precautions: Fall Precaution Comments: limb gaurd L LE - keeps knee in extension, to be worn at all times Restrictions Weight Bearing Restrictions: Yes LUE Weight Bearing: Non weight bearing       Mobility Bed Mobility                    Transfers                      Balance                                           ADL either performed or assessed with clinical judgement   ADL Overall ADL's : Needs assistance/impaired                                       General ADL Comments: Pt refused to complete or simulate ADLs this session, Verbalized understanding of compensatory LB bathing and dressing techniques leaning to side. Pt alos declined transfer to w/c for propulsion trg     Vision        Perception     Praxis      Cognition                                                Exercises     Shoulder Instructions       General Comments      Pertinent Vitals/ Pain          Home Living                                          Prior Functioning/Environment              Frequency  Min 2X/week        Progress Toward Goals  OT Goals(current goals can now be found in the care plan section)  Progress towards OT goals: Progressing toward goals  Acute  Rehab OT Goals Patient Stated Goal: go home  Plan Discharge plan needs to be updated    Co-evaluation                 AM-PAC OT "6 Clicks" Daily Activity     Outcome Measure   Help from another person eating meals?: None Help from another person taking care of personal grooming?: A Little Help from another person toileting, which includes using toliet, bedpan, or urinal?: A Little Help from another person bathing (including washing, rinsing, drying)?: A Little Help from another person to put on and taking off regular upper body clothing?: None Help from another person to put on and taking off regular lower body clothing?: A Little 6 Click Score: 20    End of Session    OT Visit Diagnosis: Unsteadiness on feet (R26.81);Other abnormalities of gait and mobility (R26.89)   Activity Tolerance Patient limited by fatigue   Patient Left in bed;with call bell/phone within reach   Nurse Communication          Time: 8206-0156 OT Time Calculation (min): 13 min  Charges:       Galen Manila 04/26/2021, 3:32 PM

## 2021-04-27 NOTE — Progress Notes (Signed)
Discharge instructions reviewed with pt.  Copy of instructions and scripts given to pt.  Pt calling for his ride home. Pt DME at bedside and will go with him at discharge.  Waiting for ride to come.

## 2021-04-27 NOTE — Progress Notes (Signed)
  Pt d/c'd via wheelchair with belongings, with family.           Escorted by unit staff.

## 2021-06-17 IMAGING — CT CT FOOT*L* W/O CM
3 of 4 series · 11 of 34 positions shown, 13 images · non-contrast
Comparison: MRI the left ankle and plain films left ankle
05/23/2018. Plain films left foot 08/03/2020.

CLINICAL DATA: Left foot pain for 4 months. The patient has been in
a cast for 2 months. History of diabetes and osteomyelitis.

EXAM:
CT OF THE LEFT FOOT WITHOUT CONTRAST
TECHNIQUE: Multidetector CT imaging of the left foot was performed according to
the standard protocol. Multiplanar CT image reconstructions were
also generated.

[Series 9: lfov lower extremity 2.00 br40 s3 soft · coronal · 0.29mm/px · 3 of 156 slices shown (1 of 2)]
[im 41/156  bone]
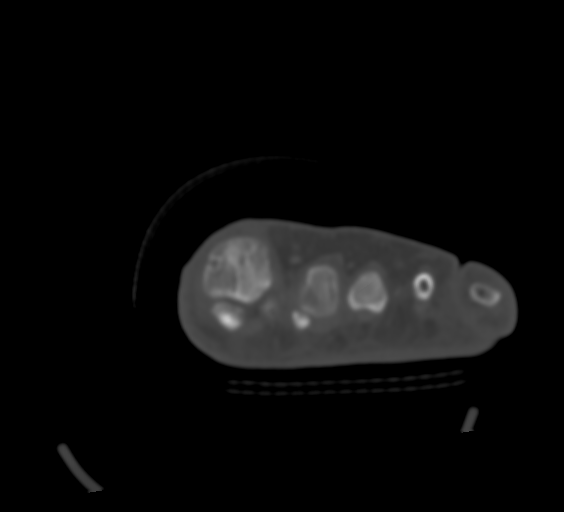
[im 66/156  bone]
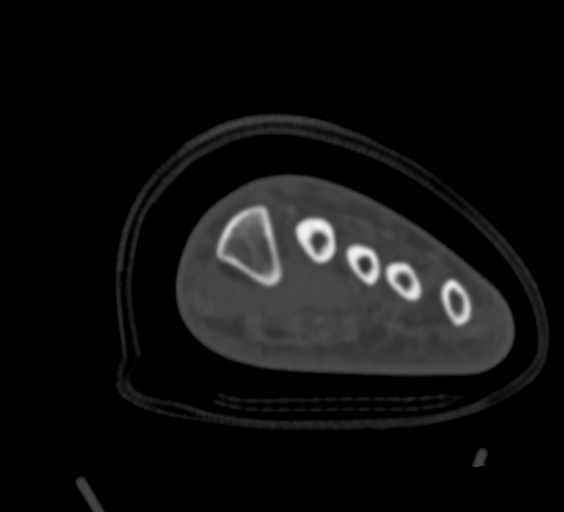
[im 90/156  bone]
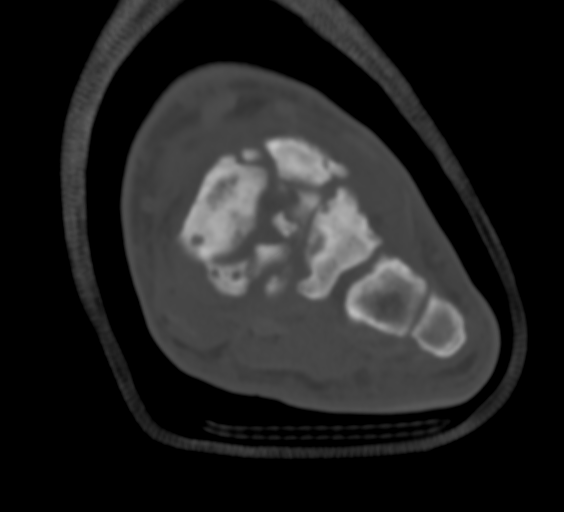

[Series 13: lfov lower extremity 2.00 br40 s3 soft · sagittal · 0.31mm/px · 5 of 74 slices shown, 6 images (2 of 2)]
[im 25/74  bone]
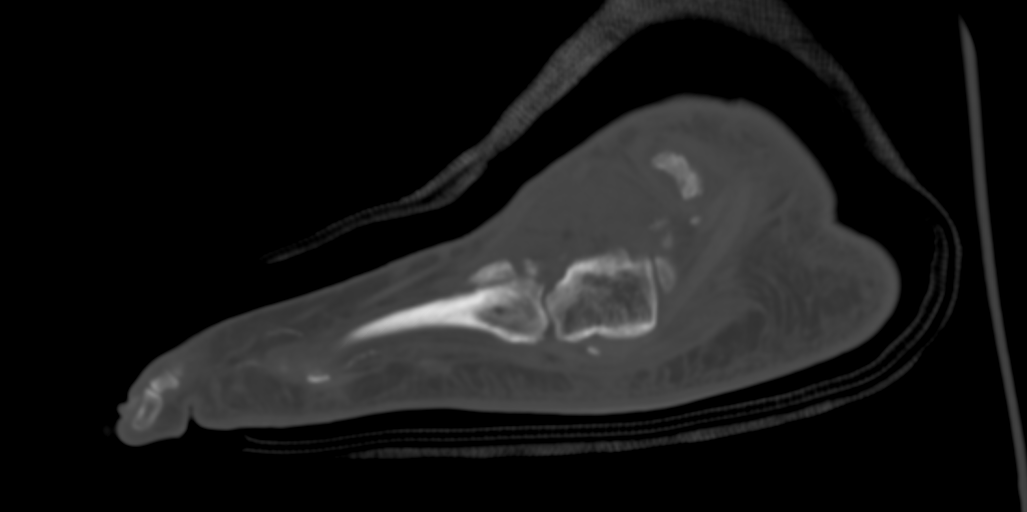
[im 31/74  bone]
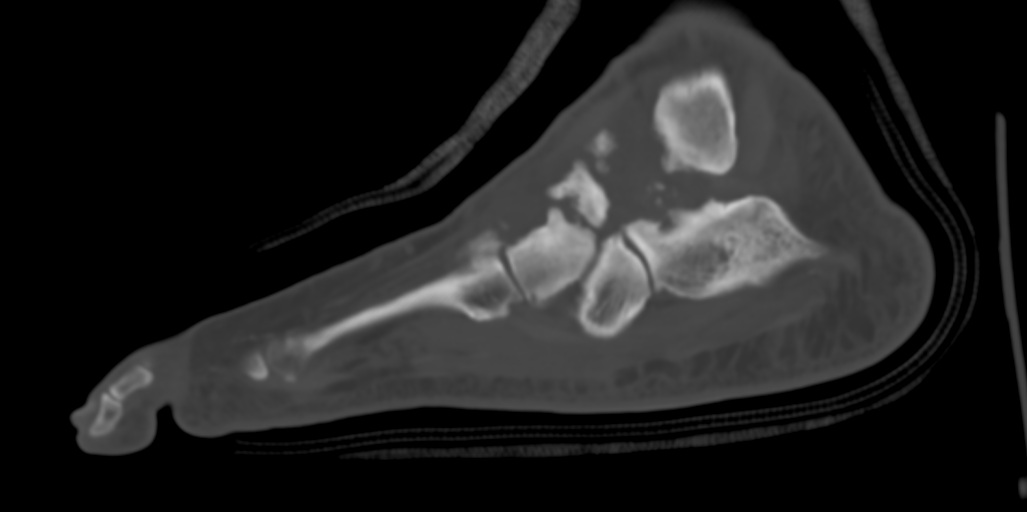
[im 37/74  soft-tissue]
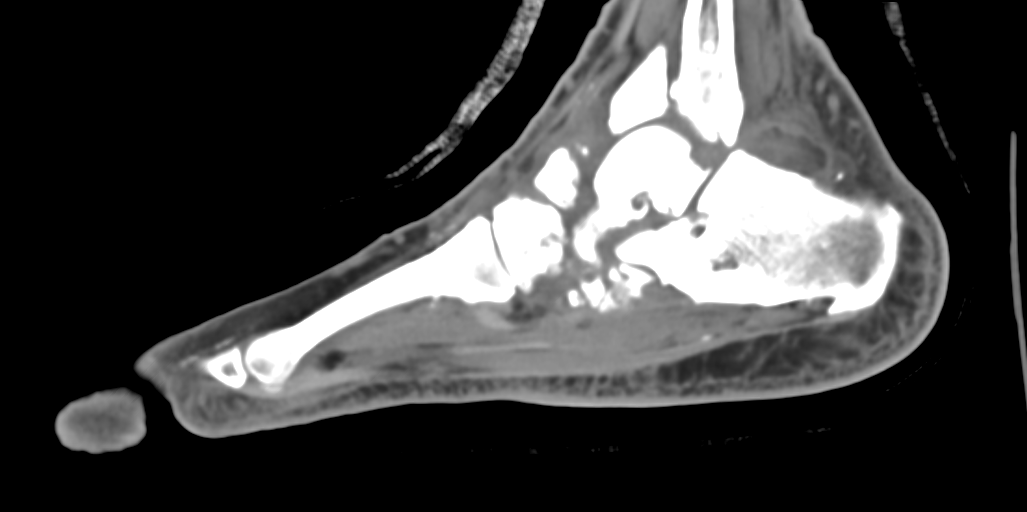
[im 37/74  bone]
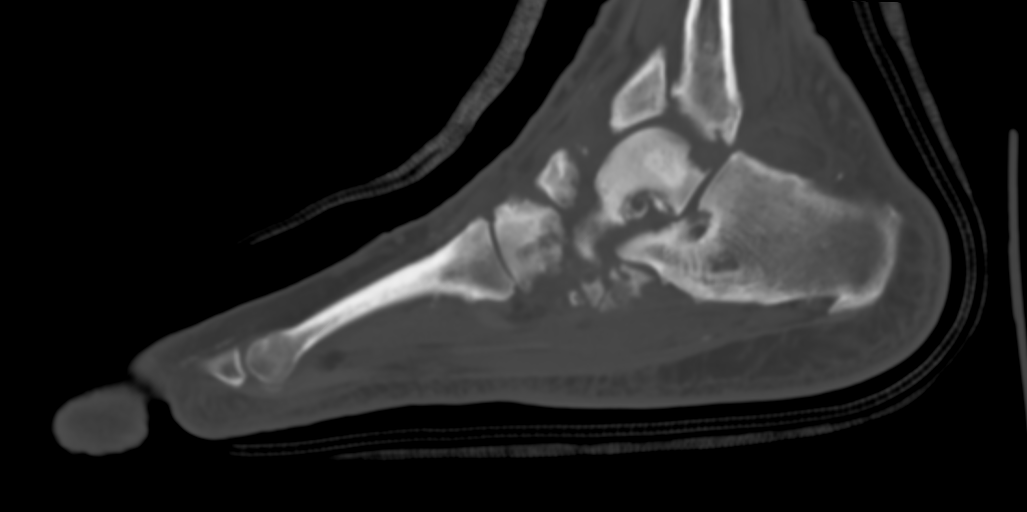
[im 43/74  bone]
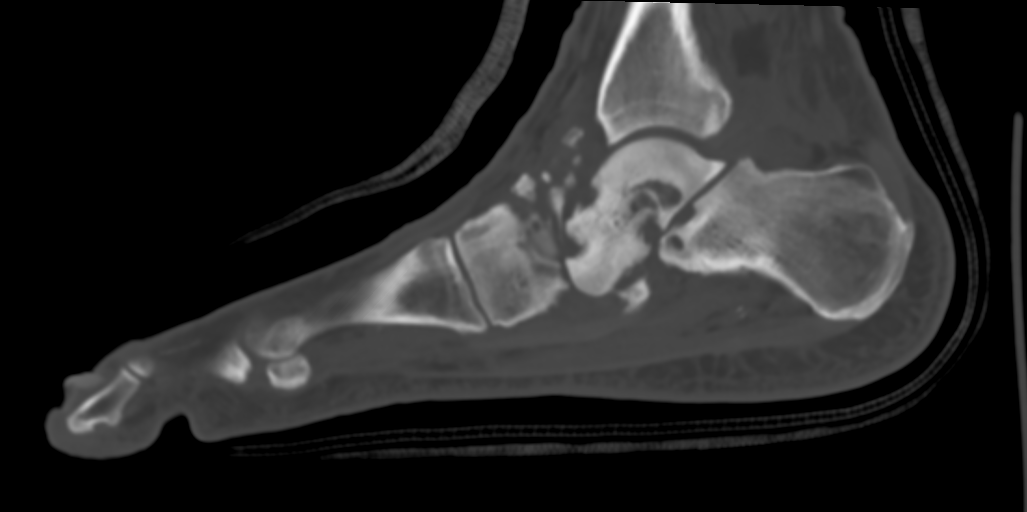
[im 49/74  bone]
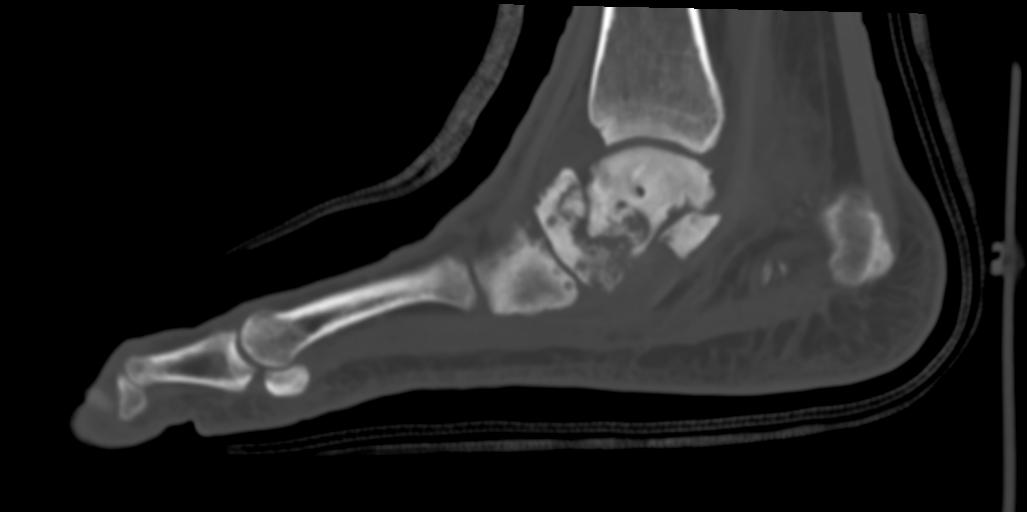

[Series 16: lfov lower extremity 0.60 br40 s3 soft thin · axial · 0.64mm/px · z∈[+566,+661]mm · 3 of 264 slices shown, 4 images]
[im 53/264  soft-tissue]
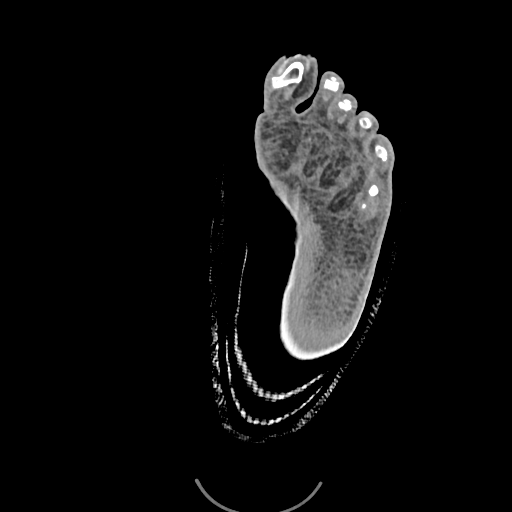
[im 53/264  bone]
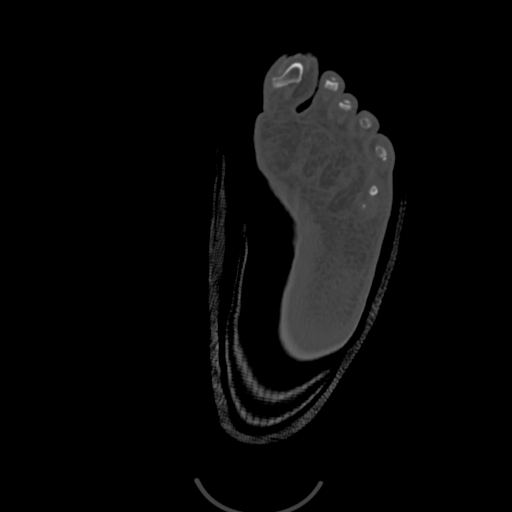
[im 132/264  bone]
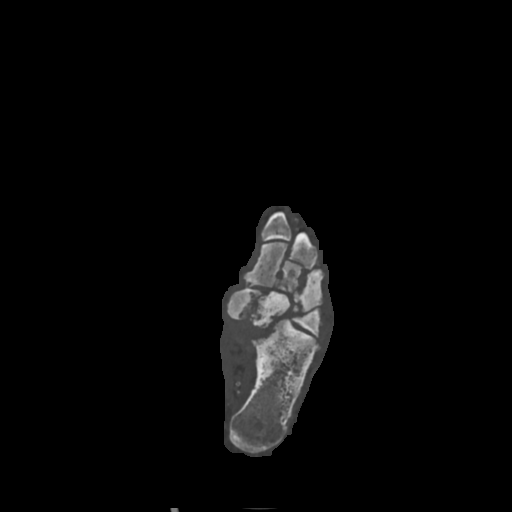
[im 211/264  bone]
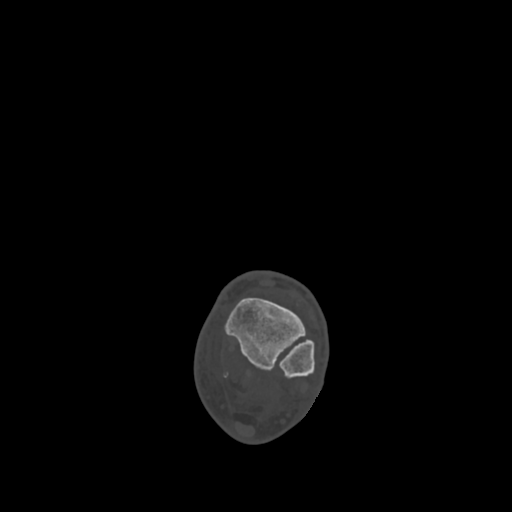

[11 of 34 positions shown; findings below may reference images not displayed]

FINDINGS: Bones/Joint/Cartilage

As seen on the comparison plain films, there severe bony destructive
change, sclerosis and disorganization of the bones of the midfoot.
Multiple bone fragments are seen about the midfoot. The navicular is
medially dislocated and there is new inferior angulation of the
talus. The appearance of the foot is much worse than on the 2368
exams.

Ligaments

Suboptimally assessed by CT.

Muscles and Tendons

Grossly intact.

Soft tissues

Soft tissue swelling about the foot is worst about the articulations
of the midfoot. No focal fluid collection is identified.
IMPRESSION: Grossly abnormal appearance of the midfoot could be due to
osteomyelitis but has an appearance most in keeping with neuropathic
change. Marked soft tissue swelling about the articulations of the
midfoot is likely due to secondary synovitis.

## 2021-06-28 ENCOUNTER — Other Ambulatory Visit: Payer: Self-pay

## 2021-07-18 ENCOUNTER — Other Ambulatory Visit: Payer: Self-pay

## 2021-07-18 ENCOUNTER — Ambulatory Visit: Payer: 59 | Attending: Student

## 2021-07-18 DIAGNOSIS — Z89512 Acquired absence of left leg below knee: Secondary | ICD-10-CM | POA: Insufficient documentation

## 2021-07-18 DIAGNOSIS — M6281 Muscle weakness (generalized): Secondary | ICD-10-CM | POA: Diagnosis present

## 2021-07-18 DIAGNOSIS — R2689 Other abnormalities of gait and mobility: Secondary | ICD-10-CM | POA: Diagnosis present
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
## 2021-07-18 NOTE — Patient Instructions (Signed)
Sweating increases with an amputation. Your body is trying to regulate your temperature & without an extremity, you sweat greater amount & quicker to cool off. Also prosthetic material like liners do not breath and add hot layers which causes even more sweating. With time your body typically will accommodate to prosthesis and your sweat level will come closer to level with amputation but not pre-amputation level.   You need to pat your limb & liner dry when you notice sweating. If you leave sweat trapped inside your liner, then it can result in a blister.   Signs of sweating in your liner: You are sweating elsewhere on your body or you notice sweat running / dripping.  Take note of how high your liner comes up on your limb when you first put your liner on your limb. If you notice that your liner has slipped down, then you probably have sweat inside your liner. A good time to check for liner slippage is when toileting.  You feel air bubbles inside your liner. When you liner slips, then air is allowed in bottom. As you put weight on prosthesis, the air is burp or pushed out. You feel something crawling or moving inside your liner. When sweat runs inside the closed system of liner, it often feels like a bug or something crawling inside your liner.  If any of above symptoms are noted, you need to remove your prosthesis & liner to pat your limb & liner dry. This is permanent need as leaving sweat or water trapped can result in a blister or wound.    Marland Kitchenamp

## 2021-07-24 ENCOUNTER — Ambulatory Visit: Payer: 59

## 2021-07-24 ENCOUNTER — Other Ambulatory Visit: Payer: Self-pay

## 2021-07-24 DIAGNOSIS — M6281 Muscle weakness (generalized): Secondary | ICD-10-CM

## 2021-07-24 DIAGNOSIS — Z89512 Acquired absence of left leg below knee: Secondary | ICD-10-CM

## 2021-07-24 DIAGNOSIS — R2689 Other abnormalities of gait and mobility: Secondary | ICD-10-CM
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
## 2021-07-27 ENCOUNTER — Ambulatory Visit: Payer: 59

## 2021-07-30 ENCOUNTER — Encounter: Payer: Self-pay | Admitting: Physical Therapy

## 2021-07-30 ENCOUNTER — Other Ambulatory Visit: Payer: Self-pay

## 2021-07-30 ENCOUNTER — Ambulatory Visit: Payer: 59 | Admitting: Physical Therapy

## 2021-07-30 DIAGNOSIS — M6281 Muscle weakness (generalized): Secondary | ICD-10-CM | POA: Diagnosis not present

## 2021-07-30 DIAGNOSIS — Z89512 Acquired absence of left leg below knee: Secondary | ICD-10-CM

## 2021-07-30 DIAGNOSIS — R2689 Other abnormalities of gait and mobility: Secondary | ICD-10-CM

## 2021-07-30 NOTE — Patient Instructions (Signed)
Do each exercise 1-2  times per day Do each exercise 5-10 repetitions Hold each exercise for 3-5 seconds to feel your location  AT [facility] FIND YOUR MIDLINE POSITION AND PLACE FEET EQUAL DISTANCE FROM THE MIDLINE.  USE TAPE ON FLOOR TO MARK THE MIDLINE POSITION. You also should try to feel with your limb pressure in socket.  You are trying to feel with limb what you used to feel with the bottom of your foot.  Side to Side Shift: Moving your hips only (not shoulders): move weight onto your left leg, HOLD/FEEL.  Move back to equal weight on each leg, HOLD/FEEL. Move weight onto your right leg, HOLD/FEEL. Move back to equal weight on each leg, HOLD/FEEL. Repeat. Front to Back Shift: Moving your hips only (not shoulders): move your weight forward onto your toes, HOLD/FEEL. Move your weight back to equal Flat Foot on both legs, HOLD/FEEL. Move your weight back onto your heels, HOLD/FEEL. Move your weight back to equal on both legs, HOLD/FEEL. Repeat. Moving Cones / Cups: With equal weight on each leg: Hold on with one hand the first time, then progress to no hand supports. Move cups from one side of sink to the other. Place cups ~2" out of your reach, progress to 10" beyond reach. Overhead/Upward Reaching: alternated reaching up to top cabinets or ceiling if no cabinets present. Keep equal weight on each leg. Start with one hand support on counter while other hand reaches and progress to no hand support with reaching. 5.   Looking Over Shoulders: With equal weight on each leg: alternate turning to look          over your shoulders with one hand support on counter as needed. Shift weight to             side looking, pull hip then shoulder then head/eyes around to look behind you. Start       with one hand support & progress to no hand support.
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
## 2021-08-03 ENCOUNTER — Ambulatory Visit: Payer: 59

## 2021-08-09 ENCOUNTER — Ambulatory Visit: Payer: 59

## 2021-08-09 ENCOUNTER — Other Ambulatory Visit: Payer: Self-pay

## 2021-08-09 DIAGNOSIS — R2689 Other abnormalities of gait and mobility: Secondary | ICD-10-CM

## 2021-08-09 DIAGNOSIS — Z89512 Acquired absence of left leg below knee: Secondary | ICD-10-CM

## 2021-08-09 DIAGNOSIS — M6281 Muscle weakness (generalized): Secondary | ICD-10-CM | POA: Diagnosis not present
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
## 2021-08-10 ENCOUNTER — Ambulatory Visit: Payer: 59

## 2021-08-13 ENCOUNTER — Ambulatory Visit: Payer: 59 | Admitting: Physical Therapy

## 2021-08-15 ENCOUNTER — Other Ambulatory Visit: Payer: Self-pay

## 2021-08-15 ENCOUNTER — Ambulatory Visit: Payer: 59 | Attending: Student

## 2021-08-15 VITALS — BP 170/94

## 2021-08-15 DIAGNOSIS — R2689 Other abnormalities of gait and mobility: Secondary | ICD-10-CM | POA: Diagnosis not present

## 2021-08-15 DIAGNOSIS — Z89512 Acquired absence of left leg below knee: Secondary | ICD-10-CM | POA: Diagnosis present

## 2021-08-15 DIAGNOSIS — M6281 Muscle weakness (generalized): Secondary | ICD-10-CM | POA: Insufficient documentation
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
## 2021-08-17 ENCOUNTER — Institutional Professional Consult (permissible substitution): Payer: 59 | Admitting: Pulmonary Disease

## 2021-08-21 ENCOUNTER — Ambulatory Visit: Payer: 59

## 2021-08-23 ENCOUNTER — Ambulatory Visit: Payer: 59

## 2021-08-27 ENCOUNTER — Ambulatory Visit: Payer: 59 | Admitting: Physical Therapy

## 2021-08-27 ENCOUNTER — Encounter: Payer: Self-pay | Admitting: Physical Therapy

## 2021-08-27 ENCOUNTER — Other Ambulatory Visit: Payer: Self-pay | Admitting: Internal Medicine

## 2021-08-27 ENCOUNTER — Other Ambulatory Visit: Payer: Self-pay

## 2021-08-27 VITALS — BP 160/85

## 2021-08-27 DIAGNOSIS — M6281 Muscle weakness (generalized): Secondary | ICD-10-CM

## 2021-08-27 DIAGNOSIS — R2689 Other abnormalities of gait and mobility: Secondary | ICD-10-CM

## 2021-08-27 DIAGNOSIS — Z89512 Acquired absence of left leg below knee: Secondary | ICD-10-CM

## 2021-08-27 NOTE — Patient Instructions (Addendum)
08/27/21  Reminders for Today:  Ply socks  Blue - 5 ply Green- 3 ply Yellow -1ply  Recommend appointment with prosthetist  PCP Toe Blood pressure Swelling in residual limb-   previously on fluid pill  PT wants to increase activity tolerance- example walking program?   Suggested Walking program: walk with cane 2-3 min, 2-3x/day.
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
## 2021-08-28 LAB — COMPLETE METABOLIC PANEL WITH GFR
AG Ratio: 1 (calc) (ref 1.0–2.5)
ALT: 13 U/L (ref 9–46)
AST: 14 U/L (ref 10–40)
Albumin: 3.2 g/dL — ABNORMAL LOW (ref 3.6–5.1)
Alkaline phosphatase (APISO): 186 U/L — ABNORMAL HIGH (ref 36–130)
BUN/Creatinine Ratio: 12 (calc) (ref 6–22)
BUN: 31 mg/dL — ABNORMAL HIGH (ref 7–25)
CO2: 24 mmol/L (ref 20–32)
Calcium: 8.9 mg/dL (ref 8.6–10.3)
Chloride: 106 mmol/L (ref 98–110)
Creat: 2.67 mg/dL — ABNORMAL HIGH (ref 0.60–1.29)
Globulin: 3.1 g/dL (calc) (ref 1.9–3.7)
Glucose, Bld: 98 mg/dL (ref 65–99)
Potassium: 5.5 mmol/L — ABNORMAL HIGH (ref 3.5–5.3)
Sodium: 138 mmol/L (ref 135–146)
Total Bilirubin: 0.3 mg/dL (ref 0.2–1.2)
Total Protein: 6.3 g/dL (ref 6.1–8.1)
eGFR: 29 mL/min/{1.73_m2} — ABNORMAL LOW (ref >=60)

## 2021-08-28 LAB — VITAMIN D 25 HYDROXY (VIT D DEFICIENCY, FRACTURES): Vit D, 25-Hydroxy: 14 ng/mL — ABNORMAL LOW (ref 30–100)

## 2021-08-28 LAB — TSH: TSH: 0.98 mIU/L (ref 0.40–4.50)

## 2021-08-28 LAB — CBC
HCT: 36.8 % — ABNORMAL LOW (ref 38.5–50.0)
Hemoglobin: 11.9 g/dL — ABNORMAL LOW (ref 13.2–17.1)
MCH: 28.8 pg (ref 27.0–33.0)
MCHC: 32.3 g/dL (ref 32.0–36.0)
MCV: 89.1 fL (ref 80.0–100.0)
MPV: 11 fL (ref 7.5–12.5)
Platelets: 323 10*3/uL (ref 140–400)
RBC: 4.13 10*6/uL — ABNORMAL LOW (ref 4.20–5.80)
RDW: 13.3 % (ref 11.0–15.0)
WBC: 6.7 10*3/uL (ref 3.8–10.8)

## 2021-08-28 LAB — LIPID PANEL
Cholesterol: 252 mg/dL — ABNORMAL HIGH (ref <200)
HDL: 33 mg/dL — ABNORMAL LOW (ref >=40)
LDL Cholesterol (Calc): 174 mg/dL (calc) — ABNORMAL HIGH
Non-HDL Cholesterol (Calc): 219 mg/dL (calc) — ABNORMAL HIGH (ref <130)
Total CHOL/HDL Ratio: 7.6 (calc) — ABNORMAL HIGH (ref <5.0)
Triglycerides: 293 mg/dL — ABNORMAL HIGH (ref <150)

## 2021-08-28 LAB — PSA: PSA: 0.31 ng/mL (ref <=4.00)

## 2021-08-29 ENCOUNTER — Ambulatory Visit: Payer: 59

## 2021-09-03 ENCOUNTER — Encounter: Payer: Self-pay | Admitting: Physical Therapy

## 2021-09-03 ENCOUNTER — Ambulatory Visit: Payer: 59 | Admitting: Physical Therapy

## 2021-09-03 ENCOUNTER — Other Ambulatory Visit: Payer: Self-pay

## 2021-09-03 DIAGNOSIS — M6281 Muscle weakness (generalized): Secondary | ICD-10-CM

## 2021-09-03 DIAGNOSIS — R2689 Other abnormalities of gait and mobility: Secondary | ICD-10-CM | POA: Diagnosis not present

## 2021-09-03 DIAGNOSIS — Z89512 Acquired absence of left leg below knee: Secondary | ICD-10-CM
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
## 2021-09-05 ENCOUNTER — Other Ambulatory Visit: Payer: Self-pay

## 2021-09-05 ENCOUNTER — Ambulatory Visit: Payer: 59

## 2021-09-05 DIAGNOSIS — R2689 Other abnormalities of gait and mobility: Secondary | ICD-10-CM

## 2021-09-05 DIAGNOSIS — Z89512 Acquired absence of left leg below knee: Secondary | ICD-10-CM

## 2021-09-05 DIAGNOSIS — M6281 Muscle weakness (generalized): Secondary | ICD-10-CM
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
## 2021-09-06 ENCOUNTER — Encounter: Payer: Self-pay | Admitting: Podiatry

## 2021-09-06 ENCOUNTER — Ambulatory Visit (INDEPENDENT_AMBULATORY_CARE_PROVIDER_SITE_OTHER): Payer: 59 | Admitting: Podiatry

## 2021-09-06 DIAGNOSIS — L84 Corns and callosities: Secondary | ICD-10-CM | POA: Diagnosis not present

## 2021-09-06 DIAGNOSIS — E0840 Diabetes mellitus due to underlying condition with diabetic neuropathy, unspecified: Secondary | ICD-10-CM
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
## 2021-09-10 ENCOUNTER — Ambulatory Visit: Payer: 59 | Admitting: Physical Therapy

## 2021-09-12 ENCOUNTER — Other Ambulatory Visit: Payer: Self-pay

## 2021-09-12 ENCOUNTER — Ambulatory Visit (INDEPENDENT_AMBULATORY_CARE_PROVIDER_SITE_OTHER): Payer: 59 | Admitting: Pulmonary Disease

## 2021-09-12 ENCOUNTER — Ambulatory Visit: Payer: 59

## 2021-09-12 ENCOUNTER — Encounter: Payer: Self-pay | Admitting: Pulmonary Disease

## 2021-09-12 VITALS — BP 148/82 | HR 79 | Temp 98.6°F | Ht 71.0 in | Wt 372.0 lb

## 2021-09-12 DIAGNOSIS — F332 Major depressive disorder, recurrent severe without psychotic features: Secondary | ICD-10-CM | POA: Diagnosis not present

## 2021-09-12 DIAGNOSIS — L97514 Non-pressure chronic ulcer of other part of right foot with necrosis of bone: Secondary | ICD-10-CM

## 2021-09-12 DIAGNOSIS — E08621 Diabetes mellitus due to underlying condition with foot ulcer: Secondary | ICD-10-CM | POA: Diagnosis not present

## 2021-09-12 DIAGNOSIS — G4733 Obstructive sleep apnea (adult) (pediatric): Secondary | ICD-10-CM

## 2021-09-12 NOTE — Patient Instructions (Signed)
Moderate probability of significant obstructive sleep apnea  We will schedule you for a home sleep study  Update you with results as soon as reviewed  Tentative follow-up in about 4 months  Call with significant concerns  Sleep Apnea Sleep apnea affects breathing during sleep. It causes breathing to stop for 10 seconds or more, or to become shallow. People with sleep apnea usually snore loudly. It can also increase the risk of: Heart attack. Stroke. Being very overweight (obese). Diabetes. Heart failure. Irregular heartbeat. High blood pressure. The goal of treatment is to help you breathe normally again. What are the causes? The most common cause of this condition is a collapsed or blocked airway. There are three kinds of sleep apnea: Obstructive sleep apnea. This is caused by a blocked or collapsed airway. Central sleep apnea. This happens when the brain does not send the right signals to the muscles that control breathing. Mixed sleep apnea. This is a combination of obstructive and central sleep apnea. What increases the risk? Being overweight. Smoking. Having a small airway. Being older. Being male. Drinking alcohol. Taking medicines to calm yourself (sedatives or tranquilizers). Having family members with the condition. Having a tongue or tonsils that are larger than normal. What are the signs or symptoms? Trouble staying asleep. Loud snoring. Headaches in the morning. Waking up gasping. Dry mouth or sore throat in the morning. Being sleepy or tired during the day. If you are sleepy or tired during the day, you may also: Not be able to focus your mind (concentrate). Forget things. Get angry a lot and have mood swings. Feel sad (depressed). Have changes in your personality. Have less interest in sex, if you are male. Be unable to have an erection, if you are male. How is this treated?  Sleeping on your side. Using a medicine to get rid of mucus in your  nose (decongestant). Avoiding the use of alcohol, medicines to help you relax, or certain pain medicines (narcotics). Losing weight, if needed. Changing your diet. Quitting smoking. Using a machine to open your airway while you sleep, such as: An oral appliance. This is a mouthpiece that shifts your lower jaw forward. A CPAP device. This device blows air through a mask when you breathe out (exhale). An EPAP device. This has valves that you put in each nostril. A BPAP device. This device blows air through a mask when you breathe in (inhale) and breathe out. Having surgery if other treatments do not work. Follow these instructions at home: Lifestyle Make changes that your doctor recommends. Eat a healthy diet. Lose weight if needed. Avoid alcohol, medicines to help you relax, and some pain medicines. Do not smoke or use any products that contain nicotine or tobacco. If you need help quitting, ask your doctor. General instructions Take over-the-counter and prescription medicines only as told by your doctor. If you were given a machine to use while you sleep, use it only as told by your doctor. If you are having surgery, make sure to tell your doctor you have sleep apnea. You may need to bring your device with you. Keep all follow-up visits. Contact a doctor if: The machine that you were given to use during sleep bothers you or does not seem to be working. You do not get better. You get worse. Get help right away if: Your chest hurts. You have trouble breathing in enough air. You have an uncomfortable feeling in your back, arms, or stomach. You have trouble talking. One side of  your body feels weak. A part of your face is hanging down. These symptoms may be an emergency. Get help right away. Call your local emergency services (911 in the [place] Do not wait to see if the symptoms will go away. Do not drive yourself to the hospital. Summary This condition affects breathing during  sleep. The most common cause is a collapsed or blocked airway. The goal of treatment is to help you breathe normally while you sleep. This information is not intended to replace advice given to you by your health care provider. Make sure you discuss any questions you have with your health care provider. Document Revised: 10/06/2020 Document Reviewed: 10/06/2020 Elsevier Patient Education  2022 [employer].
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
## 2021-10-01 ENCOUNTER — Ambulatory Visit: Payer: 59

## 2021-10-10 ENCOUNTER — Ambulatory Visit: Payer: 59 | Admitting: Rehabilitation

## 2021-11-06 ENCOUNTER — Ambulatory Visit: Payer: 59 | Admitting: Podiatry

## 2021-11-14 ENCOUNTER — Encounter: Payer: Self-pay | Admitting: Podiatry

## 2021-11-14 ENCOUNTER — Ambulatory Visit (INDEPENDENT_AMBULATORY_CARE_PROVIDER_SITE_OTHER): Payer: 59 | Admitting: Podiatry

## 2021-11-14 ENCOUNTER — Other Ambulatory Visit: Payer: Self-pay

## 2021-11-14 DIAGNOSIS — E11610 Type 2 diabetes mellitus with diabetic neuropathic arthropathy: Secondary | ICD-10-CM

## 2021-11-14 DIAGNOSIS — E0840 Diabetes mellitus due to underlying condition with diabetic neuropathy, unspecified: Secondary | ICD-10-CM

## 2021-11-14 DIAGNOSIS — L84 Corns and callosities: Secondary | ICD-10-CM

## 2021-11-14 DIAGNOSIS — S91209A Unspecified open wound of unspecified toe(s) with damage to nail, initial encounter: Secondary | ICD-10-CM
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
## 2021-11-29 ENCOUNTER — Other Ambulatory Visit: Payer: Self-pay

## 2021-11-29 ENCOUNTER — Encounter: Payer: Self-pay | Admitting: Rehabilitation

## 2021-11-29 ENCOUNTER — Ambulatory Visit: Payer: 59 | Attending: Student | Admitting: Rehabilitation

## 2021-11-29 DIAGNOSIS — R2681 Unsteadiness on feet: Secondary | ICD-10-CM | POA: Diagnosis present

## 2021-11-29 DIAGNOSIS — M6281 Muscle weakness (generalized): Secondary | ICD-10-CM | POA: Insufficient documentation

## 2021-11-29 DIAGNOSIS — R293 Abnormal posture: Secondary | ICD-10-CM | POA: Insufficient documentation

## 2021-11-29 DIAGNOSIS — R2689 Other abnormalities of gait and mobility: Secondary | ICD-10-CM | POA: Insufficient documentation
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
## 2021-12-05 ENCOUNTER — Encounter: Payer: Self-pay | Admitting: Rehabilitation

## 2021-12-05 ENCOUNTER — Ambulatory Visit: Payer: 59 | Admitting: Rehabilitation

## 2021-12-05 ENCOUNTER — Other Ambulatory Visit: Payer: Self-pay

## 2021-12-05 DIAGNOSIS — R293 Abnormal posture: Secondary | ICD-10-CM

## 2021-12-05 DIAGNOSIS — R2681 Unsteadiness on feet: Secondary | ICD-10-CM | POA: Diagnosis not present

## 2021-12-05 DIAGNOSIS — R2689 Other abnormalities of gait and mobility: Secondary | ICD-10-CM

## 2021-12-05 DIAGNOSIS — M6281 Muscle weakness (generalized): Secondary | ICD-10-CM
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
## 2021-12-12 ENCOUNTER — Ambulatory Visit: Payer: 59

## 2021-12-19 ENCOUNTER — Ambulatory Visit: Payer: 59 | Attending: Internal Medicine | Admitting: Rehabilitation

## 2021-12-19 ENCOUNTER — Other Ambulatory Visit: Payer: Self-pay

## 2021-12-19 ENCOUNTER — Encounter: Payer: Self-pay | Admitting: Rehabilitation

## 2021-12-19 DIAGNOSIS — R2681 Unsteadiness on feet: Secondary | ICD-10-CM | POA: Insufficient documentation

## 2021-12-19 DIAGNOSIS — R2689 Other abnormalities of gait and mobility: Secondary | ICD-10-CM | POA: Diagnosis present

## 2021-12-19 DIAGNOSIS — M6281 Muscle weakness (generalized): Secondary | ICD-10-CM | POA: Insufficient documentation

## 2021-12-19 DIAGNOSIS — R293 Abnormal posture: Secondary | ICD-10-CM | POA: Diagnosis present
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
## 2021-12-26 ENCOUNTER — Ambulatory Visit: Payer: 59 | Admitting: Rehabilitation

## 2021-12-26 ENCOUNTER — Other Ambulatory Visit: Payer: Self-pay

## 2021-12-26 ENCOUNTER — Encounter: Payer: Self-pay | Admitting: Rehabilitation

## 2021-12-26 DIAGNOSIS — R2689 Other abnormalities of gait and mobility: Secondary | ICD-10-CM

## 2021-12-26 DIAGNOSIS — R2681 Unsteadiness on feet: Secondary | ICD-10-CM | POA: Diagnosis not present

## 2021-12-26 DIAGNOSIS — M6281 Muscle weakness (generalized): Secondary | ICD-10-CM

## 2021-12-26 DIAGNOSIS — R293 Abnormal posture: Secondary | ICD-10-CM
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
## 2022-01-02 ENCOUNTER — Encounter: Payer: Self-pay | Admitting: Rehabilitation

## 2022-01-02 ENCOUNTER — Ambulatory Visit: Payer: 59 | Admitting: Rehabilitation

## 2022-01-02 ENCOUNTER — Other Ambulatory Visit: Payer: Self-pay

## 2022-01-02 DIAGNOSIS — R2681 Unsteadiness on feet: Secondary | ICD-10-CM | POA: Diagnosis not present

## 2022-01-02 DIAGNOSIS — R2689 Other abnormalities of gait and mobility: Secondary | ICD-10-CM

## 2022-01-02 DIAGNOSIS — M6281 Muscle weakness (generalized): Secondary | ICD-10-CM

## 2022-01-02 DIAGNOSIS — R293 Abnormal posture: Secondary | ICD-10-CM
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
## 2022-01-04 ENCOUNTER — Other Ambulatory Visit: Payer: Self-pay | Admitting: Surgery

## 2022-01-04 ENCOUNTER — Other Ambulatory Visit (HOSPITAL_COMMUNITY): Payer: Self-pay | Admitting: Surgery
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
## 2022-01-09 ENCOUNTER — Other Ambulatory Visit: Payer: Self-pay

## 2022-01-09 ENCOUNTER — Ambulatory Visit: Payer: 59 | Attending: Internal Medicine

## 2022-01-09 DIAGNOSIS — M6281 Muscle weakness (generalized): Secondary | ICD-10-CM | POA: Insufficient documentation

## 2022-01-09 DIAGNOSIS — R2681 Unsteadiness on feet: Secondary | ICD-10-CM | POA: Diagnosis not present

## 2022-01-09 DIAGNOSIS — Z89512 Acquired absence of left leg below knee: Secondary | ICD-10-CM | POA: Insufficient documentation

## 2022-01-09 DIAGNOSIS — R2689 Other abnormalities of gait and mobility: Secondary | ICD-10-CM | POA: Diagnosis present

## 2022-01-09 DIAGNOSIS — R293 Abnormal posture: Secondary | ICD-10-CM | POA: Insufficient documentation
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
## 2022-01-15 ENCOUNTER — Ambulatory Visit (INDEPENDENT_AMBULATORY_CARE_PROVIDER_SITE_OTHER): Payer: 59 | Admitting: Podiatry

## 2022-01-15 ENCOUNTER — Other Ambulatory Visit: Payer: Self-pay

## 2022-01-15 ENCOUNTER — Ambulatory Visit: Payer: 59

## 2022-01-15 ENCOUNTER — Encounter: Payer: Self-pay | Admitting: Podiatry

## 2022-01-15 DIAGNOSIS — M79675 Pain in left toe(s): Secondary | ICD-10-CM | POA: Diagnosis not present

## 2022-01-15 DIAGNOSIS — E0840 Diabetes mellitus due to underlying condition with diabetic neuropathy, unspecified: Secondary | ICD-10-CM

## 2022-01-15 DIAGNOSIS — G4733 Obstructive sleep apnea (adult) (pediatric): Secondary | ICD-10-CM

## 2022-01-15 DIAGNOSIS — B351 Tinea unguium: Secondary | ICD-10-CM

## 2022-01-15 DIAGNOSIS — M79674 Pain in right toe(s): Secondary | ICD-10-CM

## 2022-01-15 DIAGNOSIS — L84 Corns and callosities: Secondary | ICD-10-CM
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
## 2022-01-21 ENCOUNTER — Ambulatory Visit (HOSPITAL_COMMUNITY)
Admission: RE | Admit: 2022-01-21 | Discharge: 2022-01-21 | Disposition: A | Discharge status: Home / Self Care | Payer: 59 | Source: Ambulatory Visit | Attending: Surgery | Admitting: Surgery

## 2022-01-21 ENCOUNTER — Other Ambulatory Visit: Payer: Self-pay

## 2022-01-23 ENCOUNTER — Telehealth: Payer: Self-pay | Admitting: Pulmonary Disease

## 2022-01-23 DIAGNOSIS — G4733 Obstructive sleep apnea (adult) (pediatric): Secondary | ICD-10-CM | POA: Diagnosis not present

## 2022-01-23 NOTE — Telephone Encounter (Signed)
Call patient ? ?Sleep study result ? ?Date of study: ?01/15/2022 ? ?Impression: ?Moderate obstructive sleep apnea ?Moderate oxygen desaturations ? ?Recommendation: ?DME referral ? ?Recommend CPAP therapy for moderate obstructive sleep apnea ? ?Auto titrating CPAP with pressure settings of 5-20 will be appropriate ? ?Encourage weight loss measures ? ?Follow-up in the office 4 to 6 weeks following initiation of treatment ? ?

## 2022-01-24 NOTE — Telephone Encounter (Signed)
I called the patient and he is agreeable to the CPAP order and I a have placed the order. He knows to come back 6 weeks after starting the CPAP. Nothing further needed.  ?

## 2022-01-30 ENCOUNTER — Other Ambulatory Visit: Payer: Self-pay

## 2022-01-30 ENCOUNTER — Encounter: Payer: 59 | Attending: Surgery | Admitting: Skilled Nursing Facility1

## 2022-01-30 DIAGNOSIS — E1140 Type 2 diabetes mellitus with diabetic neuropathy, unspecified: Secondary | ICD-10-CM | POA: Diagnosis not present

## 2022-01-30 DIAGNOSIS — E0840 Diabetes mellitus due to underlying condition with diabetic neuropathy, unspecified: Secondary | ICD-10-CM | POA: Insufficient documentation

## 2022-01-30 DIAGNOSIS — Z6841 Body Mass Index (BMI) 40.0 and over, adult: Secondary | ICD-10-CM | POA: Insufficient documentation

## 2022-01-30 DIAGNOSIS — N1831 Chronic kidney disease, stage 3a: Secondary | ICD-10-CM | POA: Insufficient documentation

## 2022-01-30 DIAGNOSIS — E1122 Type 2 diabetes mellitus with diabetic chronic kidney disease: Secondary | ICD-10-CM | POA: Diagnosis not present

## 2022-01-30 DIAGNOSIS — G4730 Sleep apnea, unspecified: Secondary | ICD-10-CM | POA: Insufficient documentation

## 2022-01-30 NOTE — Progress Notes (Signed)
Nutrition Assessment for Bariatric Surgery ?Medical Nutrition Therapy ?Appt Start Time: 11:15    End Time: 12:22 ? ?Patient was seen on 01/30/2022 for Pre-Operative Nutrition Assessment. Letter of approval faxed to [facility] Surgery bariatric surgery program coordinator on 01/31/2022.  ? ?Referral stated Supervised Weight Loss (SWL) visits needed: 3 ? ?Planned surgery: RYGB ?Pt expectation of surgery: to lose weight ?Pt expectation of dietitian: none identified  ? ?  ?NUTRITION ASSESSMENT ?  ?Anthropometrics  ?Start weight at [facility]: 378.8 lbs (date: 01/30/2022 with prosthesis)  ?Height: 70 in ?BMI: 54.35 kg/m2   ?  ?Clinical  ?Medical hx: DM, CKD stage 3, sleep apnea ?Medications: Omnipod,   ?Labs: Last A1c: 7.5 ?Notable signs/symptoms: possibly needs another fitting for his prosthesis ?Any previous deficiencies? No ? ?Micronutrient Nutrition Focused Physical Exam: ?Hair: No issues observed ?Eyes: No issues observed ?Mouth: No issues observed ?Neck: No issues observed ?Nails: No issues observed ?Skin: No issues observed ? ?Lifestyle & Dietary Hx ? ?Pt arrives with his son.  ?Pt states he is not working with a therapist currently but has a plan to reach out for medication management and talk therapy due to his recent event sin his life that have been extremely stressful and overwhelming.  ?Pt states he has neuropathy in his foot and then phantom pains for his amputated foot.  ?Pt states he has back pain and MD suggests water exercises.  ?Pt states he has constipation when taking pain meds, but doesn't take pain meds often. ? ? ?24-Hr Dietary Recall ?First Meal: frozen biscuit, or left overs from the night before, or starbucks drink ?Snack: nothing ?Second Meal: Sandwich, or pick-up fast food ?Snack: bag of chips or fruit icee (italian ice) ?Third Meal: nothing, or if wife/daughter cooks, or fast food. ?Snack: if did not eat dinner than protein bar or [place] ice ?Beverages: water, diet soda, sugar drinks for  low blood sugar (once or twice a week) ?  ?Estimated Energy Needs ?Calories: 2000 ? ? ?NUTRITION DIAGNOSIS  ?Overweight/obesity (Rosa Sanchez-3.3) related to past poor dietary habits and physical inactivity as evidenced by patient w/ planned RYGB surgery following dietary guidelines for continued weight loss. ?  ? ?NUTRITION INTERVENTION  ?Nutrition counseling (C-1) and education (E-2) to facilitate bariatric surgery goals. ? ?Educated pt on micronutrient deficiencies post surgery and strategies to mitigate that risk ?  ?Pre-Op Goals Reviewed with the Patient ?Track food and beverage intake (pen and paper, MyFitness Pal, Baritastic app, etc.) ?Make healthy food choices while monitoring portion sizes ?Consume 3 meals per day or try to eat every 3-5 hours ?Avoid concentrated sugars and fried foods ?Keep sugar & fat in the single digits per serving on food labels ?Practice CHEWING your food (aim for applesauce consistency) ?Practice not drinking 15 minutes before, during, and 30 minutes after each meal and snack ?Avoid all carbonated beverages (ex: soda, sparkling beverages)  ?Limit caffeinated beverages (ex: coffee, tea, energy drinks) ?Avoid all sugar-sweetened beverages (ex: regular soda, sports drinks)  ?Avoid alcohol  ?Aim for 64-100 ounces of FLUID daily (with at least half of fluid intake being plain water)  ?Aim for at least 60-80 grams of PROTEIN daily ?Look for a liquid protein source that contains ?15 g protein and ?5 g carbohydrate (ex: shakes, drinks, shots) ?Make a list of non-food related activities ?Physical activity is an important part of a healthy lifestyle so keep it moving! The goal is to reach 150 minutes of exercise per week, including cardiovascular and weight baring activity. ? ? ?*  Goals that are bolded indicate the pt would like to start working towards these ? ?Handouts Provided Include  ?Bariatric Surgery handouts (Nutrition Visits, Pre-Op Goals, Protein Shakes, Vitamins & Minerals) ? ?Learning Style  & Readiness for Change ?Teaching method utilized: Visual & Auditory  ?Demonstrated degree of understanding via: Teach Back  ?Readiness Level: contemplative  ?Barriers to learning/adherence to lifestyle change: Family dynamics/support ? ?RD's Notes for Next Visit ?Assess pts adherence to chosen goals ?  ?  ?MONITORING & EVALUATION ?Dietary intake, weekly physical activity, body weight, and pre-op goals reached at next nutrition visit.  ?  ?Next Steps  ?Patient is to follow up at [facility] for Pre-Op Class >2 weeks before surgery for further nutrition education.  ?Return for next SWL ?
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
## 2022-02-04 ENCOUNTER — Other Ambulatory Visit: Payer: Self-pay

## 2022-02-04 ENCOUNTER — Ambulatory Visit (HOSPITAL_BASED_OUTPATIENT_CLINIC_OR_DEPARTMENT_OTHER): Payer: 59 | Attending: Orthopedic Surgery | Admitting: Physical Therapy

## 2022-02-04 ENCOUNTER — Encounter (HOSPITAL_BASED_OUTPATIENT_CLINIC_OR_DEPARTMENT_OTHER): Payer: Self-pay | Admitting: Physical Therapy

## 2022-02-04 DIAGNOSIS — R262 Difficulty in walking, not elsewhere classified: Secondary | ICD-10-CM | POA: Diagnosis present

## 2022-02-04 DIAGNOSIS — M545 Low back pain, unspecified: Secondary | ICD-10-CM | POA: Diagnosis not present

## 2022-02-12 ENCOUNTER — Encounter: Payer: 59 | Attending: Surgery | Admitting: Skilled Nursing Facility1

## 2022-02-12 DIAGNOSIS — N189 Chronic kidney disease, unspecified: Secondary | ICD-10-CM | POA: Insufficient documentation

## 2022-02-12 DIAGNOSIS — E0840 Diabetes mellitus due to underlying condition with diabetic neuropathy, unspecified: Secondary | ICD-10-CM | POA: Insufficient documentation

## 2022-02-12 DIAGNOSIS — Z713 Dietary counseling and surveillance: Secondary | ICD-10-CM | POA: Diagnosis present

## 2022-02-12 DIAGNOSIS — Z6841 Body Mass Index (BMI) 40.0 and over, adult: Secondary | ICD-10-CM | POA: Diagnosis not present

## 2022-02-12 DIAGNOSIS — E1122 Type 2 diabetes mellitus with diabetic chronic kidney disease: Secondary | ICD-10-CM | POA: Diagnosis not present

## 2022-02-12 NOTE — Progress Notes (Signed)
Supervised Weight Loss Visit ?Bariatric Nutrition Education ? ?Sx: RYGB ?1 out of 3 [facility] Appointments  ? ? ?NUTRITION ASSESSMENT ? ?Anthropometrics  ?Start weight at [facility]: 378.8 lbs (date: 01/30/2022 with prosthesis)  ?Weight: 377 pounds ?BMI: 54.09 kg/m2   ?  ?Clinical  ?Medical hx: DM, CKD stage 3, sleep apnea ?Medications: Omnipod,   ?Labs: Last A1c: 7.3, HDL: 32, LDL 132, RBC 3.7, Hemoglobin 11, Hematocrit 33.6, BUN 51, Creatinine 2.77, GFR 28, Sodium 132, Potassium 6.1, calcium 8.5, Glucose: 304 ?Notable signs/symptoms: possibly needs another fitting for his prosthesis ?Any previous deficiencies? No ? ?Lifestyle & Dietary Hx ? ?Pt states he has not been having as many lows since his previous visit with only having one in the middle of the night eating every couple hours and lowered how much insulin for the evening. Pt states he is eating every few hours to avoid lows ?Pt's Elenor Legato states: 63% in target with an average of about 124 glucose reading ?Pt states he has reduced his insulin and uses the sliding scale ?Pt states starting water and land PT next week. ?Pt states he really likes banana and they are affordable, so cutting back will be difficult. Pt currently eating 4-5 bananas/day, giving him 1688-2110 mg of potasium from bananas alone, not factoring in other foods. ? ? ?Estimated daily fluid intake: 133 oz ?Supplements:  ?Current average weekly physical activity: no activity. Pt states starting water and land PT next week. ? ?24-Hr Dietary Recall ?First Meal: frozen burrito and frozen sandwich ?Snack: banana + protein bar or granola bar ?Second Meal: [place] or ham sandwich + cheese + mayo on white bread + carrots or celery + chips sometimes with a banana  ?Snack: protein bar or banana ?Third Meal: sandwich or frozen meal ?Snack: 1 banana ?Beverages: water, 1/2 cup orange juice, diet soda, chai tea ? ?Estimated Energy Needs ?Calories: 2200 ? ? ?NUTRITION DIAGNOSIS  ?Overweight/obesity ([place]-3.3) related to past  poor dietary habits and physical inactivity as evidenced by patient w/ planned RYGB surgery following dietary guidelines for continued weight loss. ? ? ?NUTRITION INTERVENTION  ?Nutrition counseling (C-1) and education (E-2) to facilitate bariatric surgery goals. ? ?Pre-Op Goals Progress & New Goals ?Continue: Consume 3 meals per day or try to eat every 3-5 hours ?Continue: Physical activity is an important part of a healthy lifestyle so keep it moving! The goal is to reach 150 minutes of exercise per week, including cardiovascular and weight baring activity. ?NEW: reduce your snack portions, limit bananas to one per day, overall reduction of high potasium foods using list provided ?NEW: fluid intake of 90 oz per day (total, not just water) ?NEW: Contact PCP in regards to electrolyte labs, severe reduction of CHO intake along with increased activity through PT, and a need for prescribed vitamin D, 50,000 units. Ask PCP for a new lab redraw after reduction of high potasium foods. ? ?Handouts Provided Include  ?Low/high potasium foods list ? ?Learning Style & Readiness for Change ?Teaching method utilized: Visual & Auditory  ?Demonstrated degree of understanding via: Teach Back  ?Readiness Level: Ready ?Barriers to learning/adherence to lifestyle change: Stress at home ? ?RD's Notes for next Visit  ?Monitor patient electrolyte labs as it pertains to diet. ? ? ?MONITORING & EVALUATION ?Dietary intake, weekly physical activity, body weight, and pre-op goals in 1 month.  ? ?Next Steps  ?Patient is to return to [facility] in 1 month ?
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
## 2022-02-25 ENCOUNTER — Ambulatory Visit (HOSPITAL_BASED_OUTPATIENT_CLINIC_OR_DEPARTMENT_OTHER): Payer: 59 | Admitting: Physical Therapy
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
## 2022-03-01 ENCOUNTER — Encounter (HOSPITAL_BASED_OUTPATIENT_CLINIC_OR_DEPARTMENT_OTHER): Payer: Self-pay | Admitting: Physical Therapy

## 2022-03-01 ENCOUNTER — Ambulatory Visit (HOSPITAL_BASED_OUTPATIENT_CLINIC_OR_DEPARTMENT_OTHER): Payer: 59 | Attending: Orthopedic Surgery | Admitting: Physical Therapy

## 2022-03-01 DIAGNOSIS — M5459 Other low back pain: Secondary | ICD-10-CM | POA: Diagnosis present

## 2022-03-01 DIAGNOSIS — R262 Difficulty in walking, not elsewhere classified: Secondary | ICD-10-CM | POA: Insufficient documentation

## 2022-03-01 DIAGNOSIS — M545 Low back pain, unspecified: Secondary | ICD-10-CM | POA: Insufficient documentation

## 2022-03-04 ENCOUNTER — Ambulatory Visit (HOSPITAL_BASED_OUTPATIENT_CLINIC_OR_DEPARTMENT_OTHER): Payer: 59 | Admitting: Physical Therapy

## 2022-03-04 ENCOUNTER — Encounter (HOSPITAL_BASED_OUTPATIENT_CLINIC_OR_DEPARTMENT_OTHER): Payer: Self-pay

## 2022-03-08 ENCOUNTER — Encounter (HOSPITAL_BASED_OUTPATIENT_CLINIC_OR_DEPARTMENT_OTHER): Payer: Self-pay | Admitting: Physical Therapy

## 2022-03-08 ENCOUNTER — Ambulatory Visit (HOSPITAL_BASED_OUTPATIENT_CLINIC_OR_DEPARTMENT_OTHER): Payer: 59 | Admitting: Physical Therapy

## 2022-03-08 DIAGNOSIS — M545 Low back pain, unspecified: Secondary | ICD-10-CM | POA: Diagnosis not present

## 2022-03-08 DIAGNOSIS — R262 Difficulty in walking, not elsewhere classified: Secondary | ICD-10-CM

## 2022-03-08 DIAGNOSIS — M5459 Other low back pain: Secondary | ICD-10-CM
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
## 2022-03-11 ENCOUNTER — Encounter (HOSPITAL_BASED_OUTPATIENT_CLINIC_OR_DEPARTMENT_OTHER): Payer: Self-pay | Admitting: Physical Therapy

## 2022-03-11 ENCOUNTER — Ambulatory Visit (HOSPITAL_BASED_OUTPATIENT_CLINIC_OR_DEPARTMENT_OTHER): Payer: 59 | Attending: Orthopedic Surgery | Admitting: Physical Therapy

## 2022-03-11 DIAGNOSIS — R262 Difficulty in walking, not elsewhere classified: Secondary | ICD-10-CM | POA: Insufficient documentation

## 2022-03-11 DIAGNOSIS — M5459 Other low back pain: Secondary | ICD-10-CM | POA: Diagnosis present
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
## 2022-03-13 ENCOUNTER — Ambulatory Visit (HOSPITAL_BASED_OUTPATIENT_CLINIC_OR_DEPARTMENT_OTHER): Payer: 59 | Admitting: Physical Therapy

## 2022-03-13 DIAGNOSIS — R262 Difficulty in walking, not elsewhere classified: Secondary | ICD-10-CM

## 2022-03-13 DIAGNOSIS — M5459 Other low back pain: Secondary | ICD-10-CM
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
## 2022-03-14 ENCOUNTER — Encounter: Payer: 59 | Attending: Surgery | Admitting: Skilled Nursing Facility1

## 2022-03-14 ENCOUNTER — Encounter: Payer: Self-pay | Admitting: Skilled Nursing Facility1

## 2022-03-14 DIAGNOSIS — E0840 Diabetes mellitus due to underlying condition with diabetic neuropathy, unspecified: Secondary | ICD-10-CM | POA: Diagnosis present

## 2022-03-14 DIAGNOSIS — E669 Obesity, unspecified: Secondary | ICD-10-CM | POA: Insufficient documentation

## 2022-03-14 DIAGNOSIS — G4730 Sleep apnea, unspecified: Secondary | ICD-10-CM | POA: Diagnosis not present

## 2022-03-14 DIAGNOSIS — Z713 Dietary counseling and surveillance: Secondary | ICD-10-CM | POA: Diagnosis not present

## 2022-03-14 DIAGNOSIS — E1122 Type 2 diabetes mellitus with diabetic chronic kidney disease: Secondary | ICD-10-CM | POA: Insufficient documentation

## 2022-03-14 DIAGNOSIS — Z6841 Body Mass Index (BMI) 40.0 and over, adult: Secondary | ICD-10-CM | POA: Insufficient documentation

## 2022-03-14 DIAGNOSIS — N1831 Chronic kidney disease, stage 3a: Secondary | ICD-10-CM | POA: Insufficient documentation

## 2022-03-14 NOTE — Progress Notes (Signed)
Supervised Weight Loss Visit ?Bariatric Nutrition Education ? ?2 out of 3 SWL Appointments  ? ?NUTRITION ASSESSMENT ? ?Anthropometrics  ?Start weight at [facility]: 378.8 lbs (date: 01/30/2022 with prosthesis)  ?Weight: 379.6 pounds (with prosthesis)  ?BMI: 54.09 kg/m2   ?  ?Clinical  ?Sx: RYGB ?Medical hx: DM, CKD stage 3, sleep apnea ?Medications: Omnipod,   ?Labs:  03/13/2022  Potassium 5.9; Na 140 (WNL); GFR 25; Creatinine 3.00; BUN 43; Glucose 93; Ca 9.1; Phosphorus 4.3; Hemoglobin 11.4 ?Notable signs/symptoms: possibly needs another fitting for his prosthesis ?Any previous deficiencies? No ? ?Lifestyle & Dietary Hx ? ?Pt states he cut back on bananas, stating he only had 3 total in the last month. ?Pt states he went to kidney specialist [facility], stating he received medication to help with blood pressure and barrier for his kidneys but does not remember this medication and does not remember any conversations about his potassium or phosphorus [facility] is in the EMR)  ?Pt states he has a BM daily. ?Pt states yesterday was a busy day, with family therapy and DSS came by. ?Pt states his PCP said to drink more water, because he was dehydrated. ? ?Other fluid options offered to break up the monotony of water:  ?Apple juice lower in phosphorous and potassium  ?Minute maid zero sugar fruit punch ?Fruity herbal teas ? ? ?Estimated daily fluid intake: 120 oz ?Supplements:  ?Current average weekly physical activity: no activity. Pt states starting water and land PT next week. ?SF Oregan chai concentrate with almond milk ? ? ?Pts typical fluids and foods were evaluated for high potassium/phosphorus with alternative options low in those nutrients of concern (specific list provided) ? ?24-Hr Dietary Recall ?First Meal: [employer] vanilla chai tea ?Snack:  ?Second Meal: taco bell (mexican pizza, two soft tacos supreme, mountain dew zero) ?Snack:  ?Third Meal: Fish sandwich ([employer]'s frozen fish) ?Snack:  sometimes evening snack, muffin. ?Beverages: water, [employer], Ginger ale  ? ?Estimated Energy Needs ?Calories: 2200 ? ? ?NUTRITION DIAGNOSIS  ?Overweight/obesity ([place]-3.3) related to past poor dietary habits and physical inactivity as evidenced by patient w/ planned RYGB surgery following dietary guidelines for continued weight loss. ? ? ?NUTRITION INTERVENTION  ?Nutrition counseling (C-1) and education (E-2) to facilitate bariatric surgery goals. ? ?Pre-Op Goals Progress & New Goals ?Continue: Consume 3 meals per day or try to eat every 3-5 hours ?Continue: Physical activity is an important part of a healthy lifestyle so keep it moving! The goal is to reach 150 minutes of exercise per week, including cardiovascular and weight baring activity. ?Continue: reduce your snack portions, limit bananas to one per day, overall reduction of high potasium foods using list provided ?NEW: Identifying phosphorus in meals/foods/drinks (look for phos in the label) ? ?Handouts Provided Include  ?Low/high potasium foods list ?Low phosphorus foods list ? ?Learning Style & Readiness for Change ?Teaching method utilized: Visual & Auditory  ?Demonstrated degree of understanding via: Teach Back  ?Readiness Level: Ready ?Barriers to learning/adherence to lifestyle change: Stress at home ? ?RD's Notes for next Visit  ?Monitor patient electrolyte labs as it pertains to diet and CKD stage 3 ? ? ?MONITORING & EVALUATION ?Dietary intake, weekly physical activity, body weight, and pre-op goals in 1 month.  ? ?Next Steps  ?Patient is to return to [facility] in 1 month ?

## 2022-03-18 ENCOUNTER — Ambulatory Visit (HOSPITAL_BASED_OUTPATIENT_CLINIC_OR_DEPARTMENT_OTHER): Payer: 59 | Admitting: Physical Therapy

## 2022-03-19 ENCOUNTER — Ambulatory Visit: Payer: 59 | Admitting: Podiatry

## 2022-03-20 ENCOUNTER — Ambulatory Visit (HOSPITAL_BASED_OUTPATIENT_CLINIC_OR_DEPARTMENT_OTHER): Payer: 59 | Admitting: Physical Therapy

## 2022-03-25 ENCOUNTER — Ambulatory Visit (HOSPITAL_BASED_OUTPATIENT_CLINIC_OR_DEPARTMENT_OTHER): Payer: 59 | Admitting: Physical Therapy

## 2022-03-27 ENCOUNTER — Ambulatory Visit (HOSPITAL_BASED_OUTPATIENT_CLINIC_OR_DEPARTMENT_OTHER): Payer: 59 | Admitting: Physical Therapy

## 2022-03-27 ENCOUNTER — Encounter (HOSPITAL_BASED_OUTPATIENT_CLINIC_OR_DEPARTMENT_OTHER): Payer: Self-pay | Admitting: Physical Therapy

## 2022-03-27 DIAGNOSIS — M5459 Other low back pain: Secondary | ICD-10-CM

## 2022-03-27 DIAGNOSIS — R262 Difficulty in walking, not elsewhere classified: Secondary | ICD-10-CM
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
## 2022-04-01 ENCOUNTER — Ambulatory Visit (HOSPITAL_BASED_OUTPATIENT_CLINIC_OR_DEPARTMENT_OTHER): Payer: 59 | Admitting: Physical Therapy

## 2022-04-03 ENCOUNTER — Ambulatory Visit: Payer: 59 | Admitting: Podiatry

## 2022-04-03 ENCOUNTER — Ambulatory Visit (HOSPITAL_BASED_OUTPATIENT_CLINIC_OR_DEPARTMENT_OTHER): Payer: 59 | Admitting: Physical Therapy

## 2022-04-09 ENCOUNTER — Ambulatory Visit (HOSPITAL_BASED_OUTPATIENT_CLINIC_OR_DEPARTMENT_OTHER): Payer: 59 | Admitting: Physical Therapy

## 2022-04-11 ENCOUNTER — Ambulatory Visit (HOSPITAL_BASED_OUTPATIENT_CLINIC_OR_DEPARTMENT_OTHER): Payer: 59 | Admitting: Physical Therapy

## 2022-04-15 ENCOUNTER — Encounter: Payer: Self-pay | Admitting: Skilled Nursing Facility1

## 2022-04-15 ENCOUNTER — Encounter: Payer: 59 | Attending: Surgery | Admitting: Skilled Nursing Facility1

## 2022-04-15 DIAGNOSIS — Z6841 Body Mass Index (BMI) 40.0 and over, adult: Secondary | ICD-10-CM | POA: Diagnosis not present

## 2022-04-15 DIAGNOSIS — E1165 Type 2 diabetes mellitus with hyperglycemia: Secondary | ICD-10-CM | POA: Diagnosis present

## 2022-04-15 DIAGNOSIS — E669 Obesity, unspecified: Secondary | ICD-10-CM | POA: Diagnosis present

## 2022-04-15 DIAGNOSIS — N183 Chronic kidney disease, stage 3 unspecified: Secondary | ICD-10-CM | POA: Insufficient documentation

## 2022-04-15 DIAGNOSIS — E1122 Type 2 diabetes mellitus with diabetic chronic kidney disease: Secondary | ICD-10-CM | POA: Insufficient documentation

## 2022-04-15 DIAGNOSIS — E0840 Diabetes mellitus due to underlying condition with diabetic neuropathy, unspecified: Secondary | ICD-10-CM | POA: Diagnosis present

## 2022-04-15 DIAGNOSIS — G4730 Sleep apnea, unspecified: Secondary | ICD-10-CM | POA: Insufficient documentation

## 2022-04-15 NOTE — Progress Notes (Signed)
Supervised Weight Loss Visit Bariatric Nutrition Education  3 out of 3 SWL Appointments   Pt completed visits.   Pt has cleared nutrition requirements.    NUTRITION ASSESSMENT  Anthropometrics  Start weight at [facility]: 378.8 lbs (date: 01/30/2022 with prosthesis)  Weight: 367.8 pounds (with prosthesis)  BMI: 52.77 kg/m2     Clinical  Sx: RYGB Medical hx: DM, CKD stage 3, sleep apnea Medications: Omnipod,  farxiga Labs:  03/13/2022  Potassium 5.9; Na 140 (WNL); GFR 25; Creatinine 3.00; BUN 43; Glucose 93; Ca 9.1; Phosphorus 4.3; Hemoglobin 11.4 Notable signs/symptoms: possibly needs another fitting for his prosthesis Any previous deficiencies? No  Lifestyle & Dietary Hx  Pt states he saw his kidney specialist which started him on a new medication (farxiga).  Pt states he sees his renal doc next week.  Pt reports no concerning symptoms.  Pt states he has therapy upcoming and started a new depression medication.    Pt states his blood sugars have been better with no lows since eating regularly; blood sugars reported: 70-125 highest being 163  Pt states he feels he has learned the importance of reading the nutrition facts label looking for nutrients of concern including renal recommendations and learning the different food groups.   Pt states after surgery he is really going to have to focus in on portion control and finding foods he can tolerate.    Other fluid options offered to break up the monotony of water:  Apple juice lower in phosphorous and potassium  Minute maid zero sugar fruit punch Fruity herbal teas SF Oregan chai concentrate with almond milk  Estimated daily fluid intake: 120 oz Supplements:  Current average weekly physical activity: no activity. started water and land PT had to cancel a few times due to children responsibilities    24-Hr Dietary Recall First Meal: 2 eggs + 2 white toast + cheese Snack: fruit or vegetables  Second Meal: fast food or  sandwich Snack: almonds Third Meal: frozen cauliflower crust pizza + carrots + celery  Snack: sometimes evening snack, muffin Beverages: water, minute made no sugar fruit punch, coffee + sugar alternative  Estimated Energy Needs Calories: 2200   NUTRITION DIAGNOSIS  Overweight/obesity ([place]-3.3) related to past poor dietary habits and physical inactivity as evidenced by patient w/ planned RYGB surgery following dietary guidelines for continued weight loss.   NUTRITION INTERVENTION  Nutrition counseling (C-1) and education (E-2) to facilitate bariatric surgery goals.  Pre-Op Goals Progress & New Goals Continue: Consume 3 meals per day or try to eat every 3-5 hours Continue: Physical activity is an important part of a healthy lifestyle so keep it moving! The goal is to reach 150 minutes of exercise per week, including cardiovascular and weight baring activity. Continue: reduce your snack portions, limit bananas to one per day, overall reduction of high potasium foods using list provided continue: Identifying phosphorus in meals/foods/drinks (look for phos in the label) NEW: identify complex carbs and choose them  Handouts previously Provided Include  Low/high potasium foods list Low phosphorus foods list  Learning Style & Readiness for Change Teaching method utilized: Visual & Auditory  Demonstrated degree of understanding via: Teach Back  Readiness Level: Ready Barriers to learning/adherence to lifestyle change: Stress at home  RD's Notes for next Visit  Monitor patient electrolyte labs as it pertains to diet and CKD stage 3   MONITORING & EVALUATION Dietary intake, weekly physical activity, body weight, and pre-op goals in 1 month.   Next Steps  Patient is to return to [facility] for pre-op class Pt has completed visits. No further supervised visits required/recomended

## 2022-04-16 ENCOUNTER — Encounter: Payer: Self-pay | Admitting: Podiatry

## 2022-04-16 ENCOUNTER — Ambulatory Visit (INDEPENDENT_AMBULATORY_CARE_PROVIDER_SITE_OTHER): Payer: 59 | Admitting: Podiatry

## 2022-04-16 DIAGNOSIS — E0840 Diabetes mellitus due to underlying condition with diabetic neuropathy, unspecified: Secondary | ICD-10-CM | POA: Diagnosis not present

## 2022-04-16 DIAGNOSIS — E11610 Type 2 diabetes mellitus with diabetic neuropathic arthropathy: Secondary | ICD-10-CM | POA: Diagnosis not present
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
## 2022-04-29 ENCOUNTER — Ambulatory Visit (HOSPITAL_BASED_OUTPATIENT_CLINIC_OR_DEPARTMENT_OTHER): Payer: 59 | Admitting: Physical Therapy

## 2022-05-01 ENCOUNTER — Ambulatory Visit (HOSPITAL_BASED_OUTPATIENT_CLINIC_OR_DEPARTMENT_OTHER): Payer: 59 | Admitting: Physical Therapy

## 2022-05-06 ENCOUNTER — Encounter: Payer: Self-pay | Admitting: Skilled Nursing Facility1

## 2022-05-06 ENCOUNTER — Encounter: Payer: 59 | Admitting: Skilled Nursing Facility1

## 2022-05-06 DIAGNOSIS — E1165 Type 2 diabetes mellitus with hyperglycemia: Secondary | ICD-10-CM

## 2022-05-06 DIAGNOSIS — E0840 Diabetes mellitus due to underlying condition with diabetic neuropathy, unspecified: Secondary | ICD-10-CM | POA: Diagnosis not present

## 2022-05-06 DIAGNOSIS — E1122 Type 2 diabetes mellitus with diabetic chronic kidney disease: Secondary | ICD-10-CM | POA: Diagnosis not present

## 2022-05-09 ENCOUNTER — Ambulatory Visit (INDEPENDENT_AMBULATORY_CARE_PROVIDER_SITE_OTHER): Payer: 59 | Admitting: Pulmonary Disease

## 2022-05-09 ENCOUNTER — Encounter: Payer: Self-pay | Admitting: Pulmonary Disease

## 2022-05-09 VITALS — BP 128/82 | HR 75 | Temp 98.1°F | Ht 70.0 in | Wt 364.0 lb

## 2022-05-09 DIAGNOSIS — G4733 Obstructive sleep apnea (adult) (pediatric): Secondary | ICD-10-CM
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
## 2022-05-09 NOTE — Patient Instructions (Signed)
Referral to ENT for surgical options for the management of moderate obstructive sleep apnea  Intolerant of CPAP-has tried multiple masks that have not worked  BMI currently of 52  Tentative follow-up in 3 months  At present, weight loss efforts, regular exercises  Continue to try to use CPAP as best as we can

## 2022-05-29 NOTE — Progress Notes (Addendum)
COVID Vaccine Completed: yes x4  Date of COVID positive in last 90 days: no  PCP - Fleet Contras, MD Cardiologist - n/a  Chest x-ray - 02/01/22 [facility] EKG - 05/30/22 Epic/chart Stress Test - 04/03/20 Epic ECHO - 03/18/20 Epic Cardiac Cath - n/a Pacemaker/ICD device last checked: n/a Spinal Cord Stimulator: n/a  Bowel Prep - no solids after 6pm night before  Sleep Study - yes, positive CPAP - has not used in past 2 months  Fasting Blood Sugar - free style libre 80-130 Checks Blood Sugar 6-7 times a day  Blood Thinner Instructions: n/a Aspirin Instructions: Last Dose:  Activity level: Can perform activities of daily living without stopping and without symptoms of chest pain or shortness of breath. Difficulty with stairs due to weight    Anesthesia review: DM2, HTN, OSA, insulin pump, CKD, K+ 5.6  Patient denies shortness of breath, fever, cough and chest pain at PAT appointment  Patient verbalized understanding of instructions that were given to them at the PAT appointment. Patient was also instructed that they will need to review over the PAT instructions again at home before surgery.

## 2022-05-29 NOTE — Patient Instructions (Addendum)
DUE TO COVID-19 ONLY TWO VISITORS  (aged 44 and older)  ARE ALLOWED TO COME WITH YOU AND STAY IN THE WAITING ROOM ONLY DURING PRE OP AND PROCEDURE.   **NO VISITORS ARE ALLOWED IN THE SHORT STAY AREA OR RECOVERY ROOM!!**  IF YOU WILL BE ADMITTED INTO THE HOSPITAL YOU ARE ALLOWED ONLY FOUR SUPPORT PEOPLE DURING VISITATION HOURS ONLY (7 AM -8PM)   The support person(s) must pass our screening, gel in and out, and wear a mask at all times, including in the patient's room. Patients must also wear a mask when staff or their support person are in the room. Visitors GUEST BADGE MUST BE WORN VISIBLY  One adult visitor may remain with you overnight and MUST be in the room by 8 P.M.     Your procedure is scheduled on: 06/10/22   Report to [facility] Main Entrance    Report to admitting at 5:15 AM   Call this number if you have problems the morning of surgery [phone]   MORNING OF SURGERY DRINK:   DRINK 1 G2 drink BEFORE YOU LEAVE HOME, DRINK ALL OF THE  G2 DRINK AT ONE TIME.   NO SOLID FOOD AFTER 600 PM THE NIGHT BEFORE YOUR SURGERY. YOU MAY DRINK CLEAR FLUIDS. THE G2 DRINK YOU DRINK BEFORE YOU LEAVE HOME WILL BE THE LAST FLUIDS YOU DRINK BEFORE SURGERY.  PAIN IS EXPECTED AFTER SURGERY AND WILL NOT BE COMPLETELY ELIMINATED. AMBULATION AND TYLENOL WILL HELP REDUCE INCISIONAL AND GAS PAIN. MOVEMENT IS KEY!  YOU ARE EXPECTED TO BE OUT OF BED WITHIN 4 HOURS OF ADMISSION TO YOUR PATIENT ROOM.  SITTING IN THE RECLINER THROUGHOUT THE DAY IS IMPORTANT FOR DRINKING FLUIDS AND MOVING GAS THROUGHOUT THE GI TRACT.  COMPRESSION STOCKINGS SHOULD BE WORN [facility] STAY UNLESS YOU ARE WALKING.   INCENTIVE SPIROMETER SHOULD BE USED EVERY HOUR WHILE AWAKE TO DECREASE POST-OPERATIVE COMPLICATIONS SUCH AS PNEUMONIA.  WHEN DISCHARGED HOME, IT IS IMPORTANT TO CONTINUE TO WALK EVERY HOUR AND USE THE INCENTIVE SPIROMETER EVERY HOUR.    You may have the following liquids until 4:15 AM DAY OF  SURGERY  Water Black Coffee (sugar ok, NO MILK/CREAM OR CREAMERS)  Tea (sugar ok, NO MILK/CREAM OR CREAMERS) regular and decaf                             Plain Jell-O (NO RED)                                           Fruit ices (not with fruit pulp, NO RED)                                     Popsicles (NO RED)                                                                  Juice: apple, WHITE grape, WHITE cranberry Sports drinks like Gatorade (NO RED)    The day of surgery:  Drink ONE (1) Pre-Surgery G2  at 4:15 AM the morning of surgery. Drink in one sitting. Do not sip.  This drink was given to you during your hospital  pre-op appointment visit. Nothing else to drink after completing the  Pre-Surgery G2.          If you have questions, please contact your surgeon's office.   FOLLOW BOWEL PREP AND ANY ADDITIONAL PRE OP INSTRUCTIONS YOU RECEIVED FROM YOUR SURGEON'S OFFICE!!!     Oral Hygiene is also important to reduce your risk of infection.                                    Remember - BRUSH YOUR TEETH THE MORNING OF SURGERY WITH YOUR REGULAR TOOTHPASTE   Take these medicines the morning of surgery with A SIP OF WATER: Tylenol, Duloxetine, Hydralazine, Norco, Metoprolol, Pantoprazole   DO NOT TAKE ANY ORAL DIABETIC MEDICATIONS DAY OF YOUR SURGERY  How to Manage Your Diabetes Before and After Surgery  Why is it important to control my blood sugar before and after surgery? Improving blood sugar levels before and after surgery helps healing and can limit problems. A way of improving blood sugar control is eating a healthy diet by:  Eating less sugar and carbohydrates  Increasing activity/exercise  Talking with your doctor about reaching your blood sugar goals High blood sugars (greater than 180 mg/dL) can raise your risk of infections and slow your recovery, so you will need to focus on controlling your diabetes during the weeks before surgery. Make sure that the doctor who  takes care of your diabetes knows about your planned surgery including the date and location.  How do I manage my blood sugar before surgery? Check your blood sugar at least 4 times a day, starting 2 days before surgery, to make sure that the level is not too high or low. Check your blood sugar the morning of your surgery when you wake up and every 2 hours until you get to the Short Stay unit. If your blood sugar is less than 70 mg/dL, you will need to treat for low blood sugar: Do not take insulin. Treat a low blood sugar (less than 70 mg/dL) with  cup of clear juice (cranberry or apple), 4 glucose tablets, OR glucose gel. Recheck blood sugar in 15 minutes after treatment (to make sure it is greater than 70 mg/dL). If your blood sugar is not greater than 70 mg/dL on recheck, call [phone] for further instructions. Report your blood sugar to the short stay nurse when you get to Short Stay.  If you are admitted to the hospital after surgery: Your blood sugar will be checked by the staff and you will probably be given insulin after surgery (instead of oral diabetes medicines) to make sure you have good blood sugar levels. The goal for blood sugar control after surgery is 80-180 mg/dL.   WHAT DO I DO ABOUT MY DIABETES MEDICATION?  Do not take oral diabetes medicines (pills) the morning of surgery.  HOLD Farxiga 3 days before surgery  The day of surgery, do not take other diabetes injectables, including Byetta (exenatide), Bydureon (exenatide ER), Victoza (liraglutide), or Trulicity (dulaglutide).  If your CBG is greater than 220 mg/dL, you may take  of your sliding scale  (correction) dose of insulin.    For patients with insulin pumps: Contact your diabetes doctor for specific instructions before surgery. Decrease basal rates by 20% at  midnight the night before your surgery. Note that if your surgery is planned to be longer than 2 hours, your insulin pump will be removed and  intravenous (IV) insulin will be started and managed by the nurses and the anesthesiologist. You will be able to restart your insulin pump once you are awake and able to manage it.  Make sure to bring insulin pump supplies to the hospital with you in case the  site needs to be changed.  Reviewed and Endorsed by [facility] Patient Education Committee, August 2015   Bring CPAP mask and tubing day of surgery.                              You may not have any metal on your body including jewelry, and body piercing             Do not wear lotions, powders, perfumes/cologne, or deodorant              Men may shave face and neck.   Do not bring valuables to the hospital. [facility] IS NOT             RESPONSIBLE   FOR VALUABLES.   Bring small overnight bag day of surgery.   DO NOT BRING YOUR HOME MEDICATIONS TO THE HOSPITAL. PHARMACY WILL DISPENSE MEDICATIONS LISTED ON YOUR MEDICATION LIST TO YOU DURING YOUR ADMISSION IN THE HOSPITAL!    Special Instructions: Bring a copy of your healthcare power of attorney and living will documents         the day of surgery if you haven't scanned them before.              Please read over the following fact sheets you were given: IF YOU HAVE QUESTIONS ABOUT YOUR PRE-OP INSTRUCTIONS PLEASE CALL [phone]- [facility] - Preparing for Surgery Before surgery, you can play an important role.  Because skin is not sterile, your skin needs to be as free of germs as possible.  You can reduce the number of germs on your skin by washing with CHG (chlorahexidine gluconate) soap before surgery.  CHG is an antiseptic cleaner which kills germs and bonds with the skin to continue killing germs even after washing. Please DO NOT use if you have an allergy to CHG or antibacterial soaps.  If your skin becomes reddened/irritated stop using the CHG and inform your nurse when you arrive at Short Stay. Do not shave (including legs and underarms) for at least 48  hours prior to the first CHG shower.  You may shave your face/neck.  Please follow these instructions carefully:  1.  Shower with CHG Soap the night before surgery and the  morning of surgery.  2.  If you choose to wash your hair, wash your hair first as usual with your normal  shampoo.  3.  After you shampoo, rinse your hair and body thoroughly to remove the shampoo.                             4.  Use CHG as you would any other liquid soap.  You can apply chg directly to the skin and wash.  Gently with a scrungie or clean washcloth.  5.  Apply the CHG Soap to your body ONLY FROM THE NECK DOWN.   Do   not use on face/  open                           Wound or open sores. Avoid contact with eyes, ears mouth and   genitals (private parts).                       Wash face,  Genitals (private parts) with your normal soap.             6.  Wash thoroughly, paying special attention to the area where your    surgery  will be performed.  7.  Thoroughly rinse your body with warm water from the neck down.  8.  DO NOT shower/wash with your normal soap after using and rinsing off the CHG Soap.                9.  Pat yourself dry with a clean towel.            10.  Wear clean pajamas.            11.  Place clean sheets on your bed the night of your first shower and do not  sleep with pets. Day of Surgery : Do not apply any lotions/deodorants the morning of surgery.  Please wear clean clothes to the hospital/surgery center.  FAILURE TO FOLLOW THESE INSTRUCTIONS MAY RESULT IN THE CANCELLATION OF YOUR SURGERY  PATIENT SIGNATURE_________________________________  NURSE SIGNATURE__________________________________  ________________________________________________________________________   Rogelia Mire  An incentive spirometer is a tool that can help keep your lungs clear and active. This tool measures how well you are filling your lungs with each breath. Taking long deep breaths may help reverse or  decrease the chance of developing breathing (pulmonary) problems (especially infection) following: A long period of time when you are unable to move or be active. BEFORE THE PROCEDURE  If the spirometer includes an indicator to show your best effort, your nurse or respiratory therapist will set it to a desired goal. If possible, sit up straight or lean slightly forward. Try not to slouch. Hold the incentive spirometer in an upright position. INSTRUCTIONS FOR USE  Sit on the edge of your bed if possible, or sit up as far as you can in bed or on a chair. Hold the incentive spirometer in an upright position. Breathe out normally. Place the mouthpiece in your mouth and seal your lips tightly around it. Breathe in slowly and as deeply as possible, raising the piston or the ball toward the top of the column. Hold your breath for 3-5 seconds or for as long as possible. Allow the piston or ball to fall to the bottom of the column. Remove the mouthpiece from your mouth and breathe out normally. Rest for a few seconds and repeat Steps 1 through 7 at least 10 times every 1-2 hours when you are awake. Take your time and take a few normal breaths between deep breaths. The spirometer may include an indicator to show your best effort. Use the indicator as a goal to work toward during each repetition. After each set of 10 deep breaths, practice coughing to be sure your lungs are clear. If you have an incision (the cut made at the time of surgery), support your incision when coughing by placing a pillow or rolled up towels firmly against it. Once you are able to get out of bed, walk around indoors and cough well. You may stop using the incentive spirometer  when instructed by your caregiver.  RISKS AND COMPLICATIONS Take your time so you do not get dizzy or light-headed. If you are in pain, you may need to take or ask for pain medication before doing incentive spirometry. It is harder to take a deep breath if you  are having pain. AFTER USE Rest and breathe slowly and easily. It can be helpful to keep track of a log of your progress. Your caregiver can provide you with a simple table to help with this. If you are using the spirometer at home, follow these instructions: SEEK MEDICAL CARE IF:  You are having difficultly using the spirometer. You have trouble using the spirometer as often as instructed. Your pain medication is not giving enough relief while using the spirometer. You develop fever of 100.5 F (38.1 C) or higher. SEEK IMMEDIATE MEDICAL CARE IF:  You cough up bloody sputum that had not been present before. You develop fever of 102 F (38.9 C) or greater. You develop worsening pain at or near the incision site. MAKE SURE YOU:  Understand these instructions. Will watch your condition. Will get help right away if you are not doing well or get worse. Document Released: 03/10/2007 Document Revised: 01/20/2012 Document Reviewed: 05/11/2007 ExitCare Patient Information 2014 ExitCare, [place].   ________________________________________________________________________  WHAT IS A BLOOD TRANSFUSION? Blood Transfusion Information  A transfusion is the replacement of blood or some of its parts. Blood is made up of multiple cells which provide different functions. Red blood cells carry oxygen and are used for blood loss replacement. White blood cells fight against infection. Platelets control bleeding. Plasma helps clot blood. Other blood products are available for specialized needs, such as hemophilia or other clotting disorders. BEFORE THE TRANSFUSION  Who gives blood for transfusions?  Healthy volunteers who are fully evaluated to make sure their blood is safe. This is blood bank blood. Transfusion therapy is the safest it has ever been in the practice of medicine. Before blood is taken from a donor, a complete history is taken to make sure that person has no history of diseases nor engages  in risky social behavior (examples are intravenous drug use or sexual activity with multiple partners). The donor's travel history is screened to minimize risk of transmitting infections, such as malaria. The donated blood is tested for signs of infectious diseases, such as HIV and hepatitis. The blood is then tested to be sure it is compatible with you in order to minimize the chance of a transfusion reaction. If you or a relative donates blood, this is often done in anticipation of surgery and is not appropriate for emergency situations. It takes many days to process the donated blood. RISKS AND COMPLICATIONS Although transfusion therapy is very safe and saves many lives, the main dangers of transfusion include:  Getting an infectious disease. Developing a transfusion reaction. This is an allergic reaction to something in the blood you were given. Every precaution is taken to prevent this. The decision to have a blood transfusion has been considered carefully by your caregiver before blood is given. Blood is not given unless the benefits outweigh the risks. AFTER THE TRANSFUSION Right after receiving a blood transfusion, you will usually feel much better and more energetic. This is especially true if your red blood cells have gotten low (anemic). The transfusion raises the level of the red blood cells which carry oxygen, and this usually causes an energy increase. The nurse administering the transfusion will monitor you carefully for complications. HOME  CARE INSTRUCTIONS  No special instructions are needed after a transfusion. You may find your energy is better. Speak with your caregiver about any limitations on activity for underlying diseases you may have. SEEK MEDICAL CARE IF:  Your condition is not improving after your transfusion. You develop redness or irritation at the intravenous (IV) site. SEEK IMMEDIATE MEDICAL CARE IF:  Any of the following symptoms occur over the next 12 hours: Shaking  chills. You have a temperature by mouth above 102 F (38.9 C), not controlled by medicine. Chest, back, or muscle pain. People around you feel you are not acting correctly or are confused. Shortness of breath or difficulty breathing. Dizziness and fainting. You get a rash or develop hives. You have a decrease in urine output. Your urine turns a dark color or changes to pink, red, or brown. Any of the following symptoms occur over the next 10 days: You have a temperature by mouth above 102 F (38.9 C), not controlled by medicine. Shortness of breath. Weakness after normal activity. The white part of the eye turns yellow (jaundice). You have a decrease in the amount of urine or are urinating less often. Your urine turns a dark color or changes to pink, red, or brown. Document Released: 10/25/2000 Document Revised: 01/20/2012 Document Reviewed: 06/13/2008 [facility] Patient Information 2014 [place], [place].  _______________________________________________________________________

## 2022-05-30 ENCOUNTER — Encounter (HOSPITAL_COMMUNITY): Payer: Self-pay

## 2022-05-30 ENCOUNTER — Encounter (HOSPITAL_COMMUNITY)
Admission: RE | Admit: 2022-05-30 | Discharge: 2022-05-30 | Disposition: A | Discharge status: Home / Self Care | Payer: 59 | Source: Ambulatory Visit | Attending: Surgery | Admitting: Surgery

## 2022-05-30 VITALS — BP 165/88 | HR 73 | Temp 98.3°F | Resp 12 | Ht 70.0 in | Wt 360.0 lb

## 2022-05-30 DIAGNOSIS — Z6841 Body Mass Index (BMI) 40.0 and over, adult: Secondary | ICD-10-CM | POA: Diagnosis not present

## 2022-05-30 DIAGNOSIS — Z01818 Encounter for other preprocedural examination: Secondary | ICD-10-CM | POA: Diagnosis present

## 2022-05-30 DIAGNOSIS — E1165 Type 2 diabetes mellitus with hyperglycemia: Secondary | ICD-10-CM | POA: Diagnosis not present

## 2022-05-30 HISTORY — DX: Unspecified osteoarthritis, unspecified site: M19.90

## 2022-05-30 LAB — COMPREHENSIVE METABOLIC PANEL
ALT: 19 U/L (ref 0–44)
AST: 16 U/L (ref 15–41)
Albumin: 3.8 g/dL (ref 3.5–5.0)
Alkaline Phosphatase: 119 U/L (ref 38–126)
Anion gap: 8 (ref 5–15)
BUN: 37 mg/dL — ABNORMAL HIGH (ref 6–20)
CO2: 22 mmol/L (ref 22–32)
Calcium: 9.1 mg/dL (ref 8.9–10.3)
Chloride: 110 mmol/L (ref 98–111)
Creatinine, Ser: 3.22 mg/dL — ABNORMAL HIGH (ref 0.61–1.24)
GFR, Estimated: 23 mL/min — ABNORMAL LOW (ref >60)
Glucose, Bld: 150 mg/dL — ABNORMAL HIGH (ref 70–99)
Potassium: 5.6 mmol/L — ABNORMAL HIGH (ref 3.5–5.1)
Sodium: 140 mmol/L (ref 135–145)
Total Bilirubin: 0.5 mg/dL (ref 0.3–1.2)
Total Protein: 7.5 g/dL (ref 6.5–8.1)

## 2022-05-30 LAB — CBC WITH DIFFERENTIAL/PLATELET
Abs Immature Granulocytes: 0.03 10*3/uL (ref 0.00–0.07)
Basophils Absolute: 0 10*3/uL (ref 0.0–0.1)
Basophils Relative: 1 %
Eosinophils Absolute: 0.1 10*3/uL (ref 0.0–0.5)
Eosinophils Relative: 2 %
HCT: 38.5 % — ABNORMAL LOW (ref 39.0–52.0)
Hemoglobin: 11.8 g/dL — ABNORMAL LOW (ref 13.0–17.0)
Immature Granulocytes: 1 %
Lymphocytes Relative: 26 %
Lymphs Abs: 1.3 10*3/uL (ref 0.7–4.0)
MCH: 29.9 pg (ref 26.0–34.0)
MCHC: 30.6 g/dL (ref 30.0–36.0)
MCV: 97.5 fL (ref 80.0–100.0)
Monocytes Absolute: 0.5 10*3/uL (ref 0.1–1.0)
Monocytes Relative: 9 %
Neutro Abs: 3.1 10*3/uL (ref 1.7–7.7)
Neutrophils Relative %: 61 %
Platelets: 226 10*3/uL (ref 150–400)
RBC: 3.95 MIL/uL — ABNORMAL LOW (ref 4.22–5.81)
RDW: 13 % (ref 11.5–15.5)
WBC: 5.1 10*3/uL (ref 4.0–10.5)
nRBC: 0 % (ref 0.0–0.2)

## 2022-05-30 LAB — TYPE AND SCREEN
ABO/RH(D): O POS
Antibody Screen: NEGATIVE

## 2022-05-30 LAB — GLUCOSE, CAPILLARY: Glucose-Capillary: 131 mg/dL — ABNORMAL HIGH (ref 70–99)

## 2022-05-30 LAB — HEMOGLOBIN A1C
Hgb A1c MFr Bld: 7 % — ABNORMAL HIGH (ref 4.8–5.6)
Mean Plasma Glucose: 154.2 mg/dL

## 2022-05-30 NOTE — Progress Notes (Signed)
K+ 5.6, results routed to Dr. Daphine Deutscher
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
## 2022-06-09 NOTE — Anesthesia Preprocedure Evaluation (Addendum)
Anesthesia Evaluation  Patient identified by MRN, date of birth, ID band Patient awake    Reviewed: Allergy & Precautions, NPO status , Patient's Chart, lab work & pertinent test results  Airway Mallampati: III  TM Distance: >3 FB Neck ROM: Full    Dental no notable dental hx. (+) Teeth Intact, Dental Advisory Given   Pulmonary sleep apnea (does not wear his cpap) , former smoker,    Pulmonary exam normal breath sounds clear to auscultation       Cardiovascular hypertension, Normal cardiovascular exam Rhythm:Regular Rate:Normal  Echocardiogram 03/15/2020:  Normal LV systolic function with visual EF 55-60%. Left ventricle cavity  is normal in size. Mild left ventricular hypertrophy. Normal global wall  motion. Normal diastolic filling pattern, normal LAP.  Mild (Grade I) aortic regurgitation.    Neuro/Psych PSYCHIATRIC DISORDERS Anxiety    GI/Hepatic GERD  ,  Endo/Other  diabetes, Type obesity (BMI 51)  Renal/GU CRFRenal diseaseLab Results      Component                Value               Date                      CREATININE               3.22 (H)            05/30/2022               BUN                      37 (H)              05/30/2022                NA                       140                 05/30/2022                K                        5.6 (H)             05/30/2022                    Musculoskeletal  (+) Arthritis , C5 C6 Fx   Abdominal (+) + obese,   Peds  Hematology Lab Results      Component                Value               Date                         HGB                      11.8 (L)            05/30/2022                HCT                      38.5 (L)  05/30/2022               PLT                      226                 05/30/2022              Anesthesia Other Findings ALL: pregabalin  L BKA  Reproductive/Obstetrics                            Anesthesia Physical Anesthesia Plan  ASA: 3  Anesthesia Plan: General   Post-op Pain Management: Lidocaine infusion* and Ketamine IV*   Induction: Intravenous  PONV Risk Score and Plan: 3 and Treatment may vary due to age or medical condition, Midazolam and Ondansetron  Airway Management Planned: Oral ETT  Additional Equipment: None  Intra-op Plan:   Post-operative Plan: Extubation in OR  Informed Consent: I have reviewed the patients History and Physical, chart, labs and discussed the procedure including the risks, benefits and alternatives for the proposed anesthesia with the patient or authorized representative who has indicated his/her understanding and acceptance.     Dental advisory given  Plan Discussed with: CRNA and Anesthesiologist  Anesthesia Plan Comments:        Anesthesia Quick Evaluation

## 2022-06-10 ENCOUNTER — Other Ambulatory Visit: Payer: Self-pay

## 2022-06-10 ENCOUNTER — Inpatient Hospital Stay (HOSPITAL_COMMUNITY): Payer: 59 | Admitting: Physician Assistant

## 2022-06-10 ENCOUNTER — Inpatient Hospital Stay (HOSPITAL_COMMUNITY)
Admission: RE | Admit: 2022-06-10 | Discharge: 2022-06-12 | DRG: 620 | Disposition: A | Discharge status: Home / Self Care | Payer: 59 | Source: Ambulatory Visit | Attending: Surgery | Admitting: Surgery

## 2022-06-10 ENCOUNTER — Encounter (HOSPITAL_COMMUNITY): Payer: Self-pay | Admitting: Surgery

## 2022-06-10 ENCOUNTER — Inpatient Hospital Stay (HOSPITAL_COMMUNITY): Payer: 59 | Admitting: Anesthesiology

## 2022-06-10 ENCOUNTER — Encounter (HOSPITAL_COMMUNITY)
Admission: RE | Disposition: A | Discharge status: Home / Self Care | Payer: Self-pay | Source: Ambulatory Visit | Attending: Surgery

## 2022-06-10 DIAGNOSIS — F332 Major depressive disorder, recurrent severe without psychotic features: Secondary | ICD-10-CM | POA: Diagnosis present

## 2022-06-10 DIAGNOSIS — E11610 Type 2 diabetes mellitus with diabetic neuropathic arthropathy: Secondary | ICD-10-CM | POA: Diagnosis present

## 2022-06-10 DIAGNOSIS — Z9641 Presence of insulin pump (external) (internal): Secondary | ICD-10-CM | POA: Diagnosis present

## 2022-06-10 DIAGNOSIS — E1122 Type 2 diabetes mellitus with diabetic chronic kidney disease: Secondary | ICD-10-CM | POA: Diagnosis present

## 2022-06-10 DIAGNOSIS — Z794 Long term (current) use of insulin: Secondary | ICD-10-CM | POA: Diagnosis not present

## 2022-06-10 DIAGNOSIS — I129 Hypertensive chronic kidney disease with stage 1 through stage 4 chronic kidney disease, or unspecified chronic kidney disease: Secondary | ICD-10-CM | POA: Diagnosis present

## 2022-06-10 DIAGNOSIS — Z89512 Acquired absence of left leg below knee: Secondary | ICD-10-CM

## 2022-06-10 DIAGNOSIS — Z8249 Family history of ischemic heart disease and other diseases of the circulatory system: Secondary | ICD-10-CM

## 2022-06-10 DIAGNOSIS — Z9884 Bariatric surgery status: Principal | ICD-10-CM

## 2022-06-10 DIAGNOSIS — Z87891 Personal history of nicotine dependence: Secondary | ICD-10-CM

## 2022-06-10 DIAGNOSIS — F419 Anxiety disorder, unspecified: Secondary | ICD-10-CM | POA: Diagnosis present

## 2022-06-10 DIAGNOSIS — Z833 Family history of diabetes mellitus: Secondary | ICD-10-CM | POA: Diagnosis not present

## 2022-06-10 DIAGNOSIS — G4730 Sleep apnea, unspecified: Secondary | ICD-10-CM | POA: Diagnosis present

## 2022-06-10 DIAGNOSIS — N1831 Chronic kidney disease, stage 3a: Secondary | ICD-10-CM | POA: Diagnosis present

## 2022-06-10 DIAGNOSIS — E119 Type 2 diabetes mellitus without complications: Secondary | ICD-10-CM

## 2022-06-10 DIAGNOSIS — E1165 Type 2 diabetes mellitus with hyperglycemia: Secondary | ICD-10-CM

## 2022-06-10 DIAGNOSIS — K219 Gastro-esophageal reflux disease without esophagitis: Secondary | ICD-10-CM | POA: Diagnosis present

## 2022-06-10 DIAGNOSIS — Z6841 Body Mass Index (BMI) 40.0 and over, adult: Secondary | ICD-10-CM

## 2022-06-10 HISTORY — PX: HIATAL HERNIA REPAIR: SHX195

## 2022-06-10 HISTORY — PX: GASTRIC ROUX-EN-Y: SHX5262

## 2022-06-10 LAB — COMPREHENSIVE METABOLIC PANEL
ALT: 24 U/L (ref 0–44)
AST: 23 U/L (ref 15–41)
Albumin: 3.7 g/dL (ref 3.5–5.0)
Alkaline Phosphatase: 105 U/L (ref 38–126)
Anion gap: 8 (ref 5–15)
BUN: 41 mg/dL — ABNORMAL HIGH (ref 6–20)
CO2: 20 mmol/L — ABNORMAL LOW (ref 22–32)
Calcium: 8.6 mg/dL — ABNORMAL LOW (ref 8.9–10.3)
Chloride: 107 mmol/L (ref 98–111)
Creatinine, Ser: 2.98 mg/dL — ABNORMAL HIGH (ref 0.61–1.24)
GFR, Estimated: 26 mL/min — ABNORMAL LOW (ref >60)
Glucose, Bld: 142 mg/dL — ABNORMAL HIGH (ref 70–99)
Potassium: 4.9 mmol/L (ref 3.5–5.1)
Sodium: 135 mmol/L (ref 135–145)
Total Bilirubin: 0.5 mg/dL (ref 0.3–1.2)
Total Protein: 7 g/dL (ref 6.5–8.1)

## 2022-06-10 LAB — CBC
HCT: 36 % — ABNORMAL LOW (ref 39.0–52.0)
Hemoglobin: 11 g/dL — ABNORMAL LOW (ref 13.0–17.0)
MCH: 30.8 pg (ref 26.0–34.0)
MCHC: 30.6 g/dL (ref 30.0–36.0)
MCV: 100.8 fL — ABNORMAL HIGH (ref 80.0–100.0)
Platelets: 202 10*3/uL (ref 150–400)
RBC: 3.57 MIL/uL — ABNORMAL LOW (ref 4.22–5.81)
RDW: 13.3 % (ref 11.5–15.5)
WBC: 10.5 10*3/uL (ref 4.0–10.5)
nRBC: 0 % (ref 0.0–0.2)

## 2022-06-10 LAB — GLUCOSE, CAPILLARY
Glucose-Capillary: 162 mg/dL — ABNORMAL HIGH (ref 70–99)
Glucose-Capillary: 167 mg/dL — ABNORMAL HIGH (ref 70–99)
Glucose-Capillary: 213 mg/dL — ABNORMAL HIGH (ref 70–99)
Glucose-Capillary: 275 mg/dL — ABNORMAL HIGH (ref 70–99)

## 2022-06-10 LAB — CREATININE, SERUM
Creatinine, Ser: 2.93 mg/dL — ABNORMAL HIGH (ref 0.61–1.24)
GFR, Estimated: 26 mL/min — ABNORMAL LOW (ref >60)

## 2022-06-10 LAB — HEMOGLOBIN AND HEMATOCRIT, BLOOD
HCT: 36 % — ABNORMAL LOW (ref 39.0–52.0)
Hemoglobin: 11.2 g/dL — ABNORMAL LOW (ref 13.0–17.0)

## 2022-06-10 SURGERY — LAPAROSCOPIC ROUX-EN-Y GASTRIC BYPASS WITH UPPER ENDOSCOPY
Anesthesia: General | Site: Abdomen

## 2022-06-10 MED ORDER — 0.9 % SODIUM CHLORIDE (POUR BTL) OPTIME
TOPICAL | Status: DC | PRN
  Administered 2022-06-10: 1000 mL

## 2022-06-10 MED ORDER — PROPOFOL 10 MG/ML IV BOLUS
INTRAVENOUS | Status: AC
  Filled 2022-06-10: qty 20

## 2022-06-10 MED ORDER — FENTANYL CITRATE (PF) 100 MCG/2ML IJ SOLN
INTRAMUSCULAR | Status: DC | PRN
  Administered 2022-06-10: 100 ug via INTRAVENOUS

## 2022-06-10 MED ORDER — OXYCODONE HCL 5 MG PO TABS: 5.0000 mg | ORAL_TABLET | Freq: Once | ORAL | Status: DC | PRN

## 2022-06-10 MED ORDER — PHENYLEPHRINE HCL (PRESSORS) 10 MG/ML IV SOLN
INTRAVENOUS | Status: AC
Start: 2022-06-10 — End: ?
  Filled 2022-06-10: qty 1

## 2022-06-10 MED ORDER — ONDANSETRON HCL 4 MG/2ML IJ SOLN: 4.0000 mg | Freq: Once | INTRAMUSCULAR | Status: DC | PRN

## 2022-06-10 MED ORDER — PHENYLEPHRINE HCL (PRESSORS) 10 MG/ML IV SOLN
INTRAVENOUS | Status: AC
  Filled 2022-06-10: qty 1

## 2022-06-10 MED ORDER — CHLORHEXIDINE GLUCONATE 0.12 % MT SOLN
15.0000 mL | Freq: Once | OROMUCOSAL | Status: AC
  Administered 2022-06-10: 15 mL via OROMUCOSAL

## 2022-06-10 MED ORDER — SUGAMMADEX SODIUM 500 MG/5ML IV SOLN
INTRAVENOUS | Status: AC
Start: 2022-06-10 — End: ?
  Filled 2022-06-10: qty 5

## 2022-06-10 MED ORDER — ENSURE MAX PROTEIN PO LIQD
2.0000 [oz_av] | ORAL | Status: DC
  Administered 2022-06-11 – 2022-06-12 (×5): 2 [oz_av] via ORAL

## 2022-06-10 MED ORDER — ONDANSETRON HCL 4 MG/2ML IJ SOLN
INTRAMUSCULAR | Status: AC
  Filled 2022-06-10: qty 2

## 2022-06-10 MED ORDER — INSULIN PUMP
SUBCUTANEOUS | Status: DC
  Administered 2022-06-10: 4.1 via SUBCUTANEOUS
  Administered 2022-06-11: 4.45 via SUBCUTANEOUS
  Administered 2022-06-11: 4.8 via SUBCUTANEOUS
  Administered 2022-06-12 (×2): 1 via SUBCUTANEOUS
  Filled 2022-06-10: qty 1

## 2022-06-10 MED ORDER — HEPARIN SODIUM (PORCINE) 5000 UNIT/ML IJ SOLN
5000.0000 [IU] | Freq: Three times a day (TID) | INTRAMUSCULAR | Status: DC
Start: 2022-06-10 — End: 2022-06-12
  Administered 2022-06-10 – 2022-06-12 (×6): 5000 [IU] via SUBCUTANEOUS
  Filled 2022-06-10 (×6): qty 1

## 2022-06-10 MED ORDER — MORPHINE SULFATE (PF) 2 MG/ML IV SOLN
1.0000 mg | INTRAVENOUS | Status: DC | PRN
  Administered 2022-06-12: 1 mg via INTRAVENOUS
  Filled 2022-06-10: qty 1

## 2022-06-10 MED ORDER — MIDAZOLAM HCL 2 MG/2ML IJ SOLN
INTRAMUSCULAR | Status: AC
  Filled 2022-06-10: qty 2

## 2022-06-10 MED ORDER — HYDROMORPHONE HCL 1 MG/ML IJ SOLN
INTRAMUSCULAR | Status: AC
  Filled 2022-06-10: qty 1

## 2022-06-10 MED ORDER — SODIUM CHLORIDE (PF) 0.9 % IJ SOLN
INTRAMUSCULAR | Status: AC
  Filled 2022-06-10: qty 10

## 2022-06-10 MED ORDER — SODIUM CHLORIDE 0.9 % IV SOLN
2.0000 g | INTRAVENOUS | Status: AC
  Administered 2022-06-10: 2 g via INTRAVENOUS
  Filled 2022-06-10: qty 2

## 2022-06-10 MED ORDER — FENTANYL CITRATE (PF) 100 MCG/2ML IJ SOLN
INTRAMUSCULAR | Status: AC
  Filled 2022-06-10: qty 2

## 2022-06-10 MED ORDER — SODIUM CHLORIDE (PF) 0.9 % IJ SOLN
INTRAMUSCULAR | Status: DC | PRN
  Administered 2022-06-10: 10 mL

## 2022-06-10 MED ORDER — LACTATED RINGERS IV SOLN: INTRAVENOUS | Status: DC

## 2022-06-10 MED ORDER — ACETAMINOPHEN 500 MG PO TABS
1000.0000 mg | ORAL_TABLET | ORAL | Status: AC
Start: 2022-06-10 — End: 2022-06-10
  Administered 2022-06-10: 1000 mg via ORAL
  Filled 2022-06-10: qty 2

## 2022-06-10 MED ORDER — OXYCODONE HCL 5 MG/5ML PO SOLN: 5.0000 mg | Freq: Once | ORAL | Status: DC | PRN

## 2022-06-10 MED ORDER — MIDAZOLAM HCL 5 MG/5ML IJ SOLN
INTRAMUSCULAR | Status: DC | PRN
  Administered 2022-06-10: 2 mg via INTRAVENOUS

## 2022-06-10 MED ORDER — LACTATED RINGERS IV SOLN: INTRAVENOUS | Status: DC | PRN

## 2022-06-10 MED ORDER — CHLORHEXIDINE GLUCONATE CLOTH 2 % EX PADS: 6.0000 | MEDICATED_PAD | Freq: Once | CUTANEOUS | Status: DC

## 2022-06-10 MED ORDER — ACETAMINOPHEN 10 MG/ML IV SOLN
1000.0000 mg | Freq: Once | INTRAVENOUS | Status: DC | PRN
Start: 2022-06-10 — End: 2022-06-10

## 2022-06-10 MED ORDER — PHENYLEPHRINE 80 MCG/ML (10ML) SYRINGE FOR IV PUSH (FOR BLOOD PRESSURE SUPPORT)
PREFILLED_SYRINGE | INTRAVENOUS | Status: AC
  Filled 2022-06-10: qty 10

## 2022-06-10 MED ORDER — METOPROLOL TARTRATE 5 MG/5ML IV SOLN: 5.0000 mg | Freq: Four times a day (QID) | INTRAVENOUS | Status: DC | PRN

## 2022-06-10 MED ORDER — ONDANSETRON HCL 4 MG/2ML IJ SOLN
4.0000 mg | INTRAMUSCULAR | Status: DC | PRN
  Administered 2022-06-11: 4 mg via INTRAVENOUS
  Filled 2022-06-10 (×2): qty 2

## 2022-06-10 MED ORDER — ENOXAPARIN (LOVENOX) PATIENT EDUCATION KIT
PACK | Freq: Once | Status: AC
  Filled 2022-06-10: qty 1

## 2022-06-10 MED ORDER — BUPIVACAINE LIPOSOME 1.3 % IJ SUSP: 20.0000 mL | Freq: Once | INTRAMUSCULAR | Status: DC

## 2022-06-10 MED ORDER — ACETAMINOPHEN 500 MG PO TABS
1000.0000 mg | ORAL_TABLET | Freq: Three times a day (TID) | ORAL | Status: DC
  Administered 2022-06-10 – 2022-06-12 (×6): 1000 mg via ORAL
  Filled 2022-06-10 (×6): qty 2

## 2022-06-10 MED ORDER — EPHEDRINE SULFATE-NACL 50-0.9 MG/10ML-% IV SOSY
PREFILLED_SYRINGE | INTRAVENOUS | Status: DC | PRN
  Administered 2022-06-10 (×2): 10 mg via INTRAVENOUS
  Administered 2022-06-10: 5 mg via INTRAVENOUS

## 2022-06-10 MED ORDER — APREPITANT 40 MG PO CAPS
40.0000 mg | ORAL_CAPSULE | ORAL | Status: AC
  Administered 2022-06-10: 40 mg via ORAL
  Filled 2022-06-10: qty 1

## 2022-06-10 MED ORDER — PROCHLORPERAZINE EDISYLATE 10 MG/2ML IJ SOLN: 5.0000 mg | INTRAMUSCULAR | Status: DC | PRN

## 2022-06-10 MED ORDER — ROCURONIUM BROMIDE 10 MG/ML (PF) SYRINGE
PREFILLED_SYRINGE | INTRAVENOUS | Status: DC | PRN
  Administered 2022-06-10: 100 mg via INTRAVENOUS
  Administered 2022-06-10: 10 mg via INTRAVENOUS
  Administered 2022-06-10: 30 mg via INTRAVENOUS

## 2022-06-10 MED ORDER — BUPIVACAINE LIPOSOME 1.3 % IJ SUSP
INTRAMUSCULAR | Status: DC | PRN
  Administered 2022-06-10: 20 mL

## 2022-06-10 MED ORDER — OXYCODONE HCL 5 MG/5ML PO SOLN
5.0000 mg | Freq: Four times a day (QID) | ORAL | Status: DC | PRN
  Administered 2022-06-10 – 2022-06-12 (×4): 5 mg via ORAL
  Filled 2022-06-10 (×4): qty 5

## 2022-06-10 MED ORDER — LACTATED RINGERS IR SOLN
Status: DC | PRN
  Administered 2022-06-10: 1000 mL

## 2022-06-10 MED ORDER — BUPIVACAINE LIPOSOME 1.3 % IJ SUSP
INTRAMUSCULAR | Status: AC
  Filled 2022-06-10: qty 20

## 2022-06-10 MED ORDER — PHENYLEPHRINE HCL-NACL 20-0.9 MG/250ML-% IV SOLN
INTRAVENOUS | Status: DC | PRN
  Administered 2022-06-10: 50 ug/min via INTRAVENOUS

## 2022-06-10 MED ORDER — ORAL CARE MOUTH RINSE: 15.0000 mL | Freq: Once | OROMUCOSAL | Status: AC

## 2022-06-10 MED ORDER — HEPARIN SODIUM (PORCINE) 5000 UNIT/ML IJ SOLN
5000.0000 [IU] | INTRAMUSCULAR | Status: AC
  Administered 2022-06-10: 5000 [IU] via SUBCUTANEOUS
  Filled 2022-06-10: qty 1

## 2022-06-10 MED ORDER — KETAMINE HCL 50 MG/5ML IJ SOSY
PREFILLED_SYRINGE | INTRAMUSCULAR | Status: AC
Start: 2022-06-10 — End: ?
  Filled 2022-06-10: qty 5

## 2022-06-10 MED ORDER — LIDOCAINE HCL (PF) 2 % IJ SOLN
INTRAMUSCULAR | Status: AC
  Filled 2022-06-10: qty 5

## 2022-06-10 MED ORDER — PROPOFOL 10 MG/ML IV BOLUS
INTRAVENOUS | Status: DC | PRN
  Administered 2022-06-10: 200 mg via INTRAVENOUS

## 2022-06-10 MED ORDER — LIDOCAINE HCL (CARDIAC) PF 100 MG/5ML IV SOSY
PREFILLED_SYRINGE | INTRAVENOUS | Status: DC | PRN
  Administered 2022-06-10: 100 mg via INTRAVENOUS

## 2022-06-10 MED ORDER — HYDROMORPHONE HCL 1 MG/ML IJ SOLN
0.2500 mg | INTRAMUSCULAR | Status: DC | PRN
  Administered 2022-06-10 (×2): 0.5 mg via INTRAVENOUS

## 2022-06-10 MED ORDER — PANTOPRAZOLE SODIUM 40 MG IV SOLR
40.0000 mg | Freq: Every day | INTRAVENOUS | Status: DC
  Administered 2022-06-10 – 2022-06-11 (×2): 40 mg via INTRAVENOUS
  Filled 2022-06-10 (×2): qty 10

## 2022-06-10 MED ORDER — ROCURONIUM BROMIDE 10 MG/ML (PF) SYRINGE
PREFILLED_SYRINGE | INTRAVENOUS | Status: AC
  Filled 2022-06-10: qty 10

## 2022-06-10 MED ORDER — PHENYLEPHRINE 80 MCG/ML (10ML) SYRINGE FOR IV PUSH (FOR BLOOD PRESSURE SUPPORT)
PREFILLED_SYRINGE | INTRAVENOUS | Status: DC | PRN
  Administered 2022-06-10: 160 ug via INTRAVENOUS
  Administered 2022-06-10 (×2): 80 ug via INTRAVENOUS

## 2022-06-10 MED ORDER — KCL IN DEXTROSE-NACL 20-5-0.45 MEQ/L-%-% IV SOLN
INTRAVENOUS | Status: DC
  Filled 2022-06-10 (×4): qty 1000

## 2022-06-10 MED ORDER — EPHEDRINE 5 MG/ML INJ
INTRAVENOUS | Status: AC
  Filled 2022-06-10: qty 5

## 2022-06-10 MED ORDER — ACETAMINOPHEN 160 MG/5ML PO SOLN: 1000.0000 mg | Freq: Three times a day (TID) | ORAL | Status: DC

## 2022-06-10 MED ORDER — HYDRALAZINE HCL 20 MG/ML IJ SOLN
10.0000 mg | INTRAMUSCULAR | Status: DC | PRN
  Administered 2022-06-10: 10 mg via INTRAVENOUS
  Filled 2022-06-10: qty 1

## 2022-06-10 MED ORDER — KETAMINE HCL 10 MG/ML IJ SOLN
INTRAMUSCULAR | Status: DC | PRN
  Administered 2022-06-10 (×2): 20 mg via INTRAVENOUS
  Administered 2022-06-10: 10 mg via INTRAVENOUS

## 2022-06-10 MED ORDER — LIDOCAINE HCL (PF) 2 % IJ SOLN
INTRAMUSCULAR | Status: DC | PRN
  Administered 2022-06-10: 1.5 mg/kg/h via INTRADERMAL

## 2022-06-10 MED ORDER — ONDANSETRON HCL 4 MG/2ML IJ SOLN
INTRAMUSCULAR | Status: DC | PRN
  Administered 2022-06-10: 4 mg via INTRAVENOUS

## 2022-06-10 MED ORDER — LIDOCAINE HCL 2 % IJ SOLN
INTRAMUSCULAR | Status: AC
  Filled 2022-06-10: qty 20

## 2022-06-10 MED ORDER — TISSEEL VH 10 ML EX KIT
PACK | CUTANEOUS | Status: AC
Start: 2022-06-10 — End: ?
  Filled 2022-06-10: qty 2

## 2022-06-10 MED ORDER — SCOPOLAMINE 1 MG/3DAYS TD PT72
1.0000 | MEDICATED_PATCH | TRANSDERMAL | Status: DC
  Administered 2022-06-10: 1.5 mg via TRANSDERMAL
  Filled 2022-06-10: qty 1

## 2022-06-10 SURGICAL SUPPLY — 77 items
APL SRG 32X5 SNPLK LF DISP (MISCELLANEOUS) ×1
APL SWBSTK 6 STRL LF DISP (MISCELLANEOUS) ×2
APPLICATOR COTTON TIP 6 STRL (MISCELLANEOUS) ×2 IMPLANT
APPLICATOR COTTON TIP 6IN STRL (MISCELLANEOUS) ×4
APPLIER CLIP ROT 10 11.4 M/L (STAPLE)
APPLIER CLIP ROT 13.4 12 LRG (CLIP)
APR CLP LRG 13.4X12 ROT 20 MLT (CLIP)
APR CLP MED LRG 11.4X10 (STAPLE)
BAG COUNTER SPONGE SURGICOUNT (BAG) IMPLANT
BAG SPNG CNTER NS LX DISP (BAG)
BLADE SURG 15 STRL LF DISP TIS (BLADE) ×1 IMPLANT
BLADE SURG 15 STRL SS (BLADE) ×2
CABLE HIGH FREQUENCY MONO STRZ (ELECTRODE) IMPLANT
CLIP APPLIE ROT 10 11.4 M/L (STAPLE) IMPLANT
CLIP APPLIE ROT 13.4 12 LRG (CLIP) IMPLANT
CLIP SUT LAPRA TY ABSORB (SUTURE) ×2 IMPLANT
DEVICE SUT QUICK LOAD TK 5 (SUTURE) IMPLANT
DEVICE SUT TI-KNOT TK 5X26 (SUTURE) IMPLANT
DEVICE SUTURE ENDOST 10MM (ENDOMECHANICALS) ×2 IMPLANT
DISSECTOR BLUNT TIP ENDO 5MM (MISCELLANEOUS) IMPLANT
DRAIN PENROSE 0.25X18 (DRAIN) ×2 IMPLANT
GAUZE 4X4 16PLY ~~LOC~~+RFID DBL (SPONGE) ×2 IMPLANT
GLOVE SURG LX 8.0 MICRO (GLOVE) ×1
GLOVE SURG LX STRL 8.0 MICRO (GLOVE) ×1 IMPLANT
GOWN STRL REUS W/ TWL XL LVL3 (GOWN DISPOSABLE) ×2 IMPLANT
GOWN STRL REUS W/TWL XL LVL3 (GOWN DISPOSABLE) ×4
HANDLE STAPLE EGIA 4 XL (STAPLE) ×2 IMPLANT
IRRIG SUCT STRYKERFLOW 2 WTIP (MISCELLANEOUS) ×2
IRRIGATION SUCT STRKRFLW 2 WTP (MISCELLANEOUS) ×1 IMPLANT
KIT BASIN OR (CUSTOM PROCEDURE TRAY) ×2 IMPLANT
KIT GASTRIC LAVAGE 34FR ADT (SET/KITS/TRAYS/PACK) ×2 IMPLANT
KIT TURNOVER KIT A (KITS) ×1 IMPLANT
MARKER SKIN DUAL TIP RULER LAB (MISCELLANEOUS) ×2 IMPLANT
MAT PREVALON FULL STRYKER (MISCELLANEOUS) ×2 IMPLANT
NDL SPNL 22GX3.5 QUINCKE BK (NEEDLE) ×1 IMPLANT
NEEDLE SPNL 22GX3.5 QUINCKE BK (NEEDLE) ×2 IMPLANT
PACK CARDIOVASCULAR III (CUSTOM PROCEDURE TRAY) ×2 IMPLANT
PENCIL SMOKE EVACUATOR (MISCELLANEOUS) IMPLANT
RELOAD EGIA 45 MED/THCK PURPLE (STAPLE) IMPLANT
RELOAD EGIA 45 TAN VASC (STAPLE) IMPLANT
RELOAD EGIA 60 MED/THCK PURPLE (STAPLE) ×2 IMPLANT
RELOAD EGIA 60 TAN VASC (STAPLE) IMPLANT
RELOAD ENDO STITCH 2.0 (ENDOMECHANICALS) ×20
RELOAD STAPLE 45 PURP MED/THCK (STAPLE) IMPLANT
RELOAD STAPLE 60 MED/THCK ART (STAPLE) IMPLANT
RELOAD SUT SNGL STCH ABSRB 2-0 (ENDOMECHANICALS) ×5 IMPLANT
RELOAD SUT SNGL STCH BLK 2-0 (ENDOMECHANICALS) ×4 IMPLANT
RELOAD TRI 45 ART MED THCK PUR (STAPLE) ×2 IMPLANT
RELOAD TRI 60 ART MED THCK PUR (STAPLE) ×3 IMPLANT
SCISSORS LAP 5X45 EPIX DISP (ENDOMECHANICALS) ×2 IMPLANT
SEALANT SURGICAL APPL DUAL CAN (MISCELLANEOUS) ×2 IMPLANT
SET TUBE SMOKE EVAC HIGH FLOW (TUBING) ×2 IMPLANT
SHEARS HARMONIC ACE PLUS 45CM (MISCELLANEOUS) ×2 IMPLANT
SLEEVE ADV FIXATION 12X100MM (TROCAR) ×4 IMPLANT
SLEEVE ADV FIXATION 5X100MM (TROCAR) IMPLANT
SOL ANTI FOG 6CC (MISCELLANEOUS) ×1 IMPLANT
SOLUTION ANTI FOG 6CC (MISCELLANEOUS) ×1
STAPLER VISISTAT 35W (STAPLE) IMPLANT
SUT MNCRL AB 4-0 PS2 18 (SUTURE) ×6 IMPLANT
SUT RELOAD ENDO STITCH 2 48X1 (ENDOMECHANICALS) ×6
SUT RELOAD ENDO STITCH 2.0 (ENDOMECHANICALS) ×4
SUT SURGIDAC NAB ES-9 0 48 120 (SUTURE) IMPLANT
SUT VIC AB 2-0 SH 27 (SUTURE) ×2
SUT VIC AB 2-0 SH 27X BRD (SUTURE) ×1 IMPLANT
SUTURE RELOAD END STTCH 2 48X1 (ENDOMECHANICALS) ×6 IMPLANT
SUTURE RELOAD ENDO STITCH 2.0 (ENDOMECHANICALS) ×4 IMPLANT
SYR 10ML ECCENTRIC (SYRINGE) ×2 IMPLANT
SYR 20ML LL LF (SYRINGE) ×4 IMPLANT
SYR 50ML LL SCALE MARK (SYRINGE) ×2 IMPLANT
TOWEL OR 17X26 10 PK STRL BLUE (TOWEL DISPOSABLE) ×2 IMPLANT
TOWEL OR NON WOVEN STRL DISP B (DISPOSABLE) ×2 IMPLANT
TRAY FOLEY MTR SLVR 16FR STAT (SET/KITS/TRAYS/PACK) ×2 IMPLANT
TROCAR 5M 150ML BLDLS (TROCAR) ×1 IMPLANT
TROCAR ADV FIXATION 12X100MM (TROCAR) ×4 IMPLANT
TROCAR ADV FIXATION 5X100MM (TROCAR) ×2 IMPLANT
TROCAR Z-THREAD OPTICAL 5X100M (TROCAR) ×2 IMPLANT
TUBING CONNECTING 10 (TUBING) ×4 IMPLANT

## 2022-06-10 NOTE — Anesthesia Postprocedure Evaluation (Signed)
Anesthesia Post Note  Patient: Dwayne Rocha  Procedure(s) Performed: LAPAROSCOPIC ROUX-EN-Y GASTRIC BYPASS WITH UPPER ENDOSCOPY (Abdomen) HERNIA REPAIR HIATAL (Abdomen)     Patient location during evaluation: PACU Anesthesia Type: General Level of consciousness: awake and alert Pain management: pain level controlled Vital Signs Assessment: post-procedure vital signs reviewed and stable Respiratory status: spontaneous breathing, nonlabored ventilation, respiratory function stable and patient connected to nasal cannula oxygen Cardiovascular status: blood pressure returned to baseline and stable Postop Assessment: no apparent nausea or vomiting Anesthetic complications: no   No notable events documented.  Last Vitals:  Vitals:   06/10/22 1236 06/10/22 1322  BP: (!) 174/82 (!) 174/83  Pulse: 71 71  Resp: 18 18  Temp: 36.6 C 36.6 C  SpO2: 98% 100%    Last Pain:  Vitals:   06/10/22 1322  TempSrc: Oral  PainSc:                  Trevor Iha

## 2022-06-10 NOTE — Inpatient Diabetes Management (Signed)
Inpatient Diabetes Program Recommendations  AACE/ADA: New Consensus Statement on Inpatient Glycemic Control (2015)  Target Ranges:  Prepandial:   less than 140 mg/dL      Peak postprandial:   less than 180 mg/dL (1-2 hours)      Critically ill patients:  140 - 180 mg/dL   Lab Results  Component Value Date   GLUCAP 167 (H) 06/10/2022   HGBA1C 7.0 (H) 05/30/2022    Review of Glycemic Control  Diabetes history: DM2 Outpatient Diabetes medications: Omnipod insulin pump (Humalog 150 units over 3 days Current orders for Inpatient glycemic control: None postop  Inpatient Diabetes Program Recommendations:   Please consider: -Novolog correction scale 0-9 units q 4 hrs. And will follow to evaluate further insulin needs.  Thank you, Billy Fischer. Linsey Arteaga, RN, MSN, CDE  Diabetes Coordinator Inpatient Glycemic Control Team Team Pager [phone] (8am-5pm) 06/10/2022 2:56 PM

## 2022-06-10 NOTE — Discharge Instructions (Signed)
GASTRIC BYPASS / SLEEVE  Home Care Instructions  These instructions are to help you care for yourself when you go home.  Call: If you have any problems. Call [phone] and ask for the surgeon on call If you have an emergency related to your surgery please use the ER at [facility].  Tell the ER staff that you are a new post-op gastric bypass or gastric sleeve patient   Signs and symptoms to report: Severe vomiting or nausea If you cannot handle clear liquids for longer than 1 day, call your surgeon  Abdominal pain which does not get better after taking your pain medication Fever greater than 100.4 F and chills Heart rate over 100 beats a Dwayne Trouble breathing Chest pain  Redness, swelling, drainage, or foul odor at incision (surgical) sites  If your incisions open or pull apart Swelling or pain in calf (lower leg) Diarrhea (Loose bowel movements that happen often), frequent watery, uncontrolled bowel movements Constipation, (no bowel movements for 3 days) if this happens:  Take Milk of Magnesia, 2 tablespoons by mouth, 3 times a day for 2 days if needed Stop taking Milk of Magnesia once you have had a bowel movement Call your doctor if constipation continues Or Take Miralax  (instead of Milk of Magnesia) following the label instructions Stop taking Miralax once you have had a bowel movement Call your doctor if constipation continues Anything you think is "abnormal for you"   Normal side effects after surgery: Unable to sleep at night or unable to concentrate Irritability Being tearful (crying) or depressed These are common complaints, possibly related to your anesthesia, stress of surgery and change in lifestyle, that usually go away a few weeks after surgery.  If these feelings continue, call your medical doctor.  Wound Care: You may have surgical glue, steri-strips, or staples over your incisions after surgery Surgical glue:  Looks like a clear film over your incisions  and will wear off a little at a time Steri-strips : Adhesive strips of tape over your incisions. You may notice a yellowish color on the skin under the steri-strips. This is used to make the   steri-strips stick better. Do not pull the steri-strips off - let them fall off Staples: Staples may be removed before you leave the hospital If you go home with staples, call [facility] at for an appointment with your surgeon's nurse to have staples removed 10 days after surgery, [phone] Showering: You may shower two (2) days after your surgery unless your surgeon tells you differently Wash gently around incisions with warm soapy water, rinse well, and gently pat dry  If you have a drain (tube from your incision), you may need someone to hold this while you shower  No tub baths until staples are removed and incisions are healed     Medications: Medications should be liquid or crushed if larger than the size of a dime Extended release pills (medication that releases a little bit at a time through the day) should not be crushed Depending on the size and number of medications you take, you may need to space (take a few throughout the day)/change the time you take your medications so that you do not over-fill your pouch (smaller stomach) Make sure you follow-up with your primary care physician to make medication changes needed during rapid weight loss and life-style changes If you have diabetes, follow up with the doctor that orders your diabetes medication(s) within one week after surgery and check   your blood sugar regularly. Do not drive while taking narcotics (pain medications) DO NOT take NSAID'S (Examples of NSAID's include ibuprofen, naproxen)  Diet:                    First 2 Weeks  You will see the nutritionist about two (2) weeks after your surgery. The nutritionist will increase the types of foods you can eat if you are handling liquids well: If you have severe vomiting or nausea  and cannot handle clear liquids lasting longer than 1 day, call your surgeon  Protein Shake Drink at least 2 ounces of shake 5-6 times per day Each serving of protein shakes (usually 8 - 12 ounces) should have a minimum of:  15 grams of protein  And no more than 5 grams of carbohydrate  Goal for protein each day: Men = 80 grams per day Women = 60 grams per day Protein powder may be added to fluids such as non-fat milk or Lactaid milk or Soy milk (limit to 35 grams added protein powder per serving)  Hydration Slowly increase the amount of water and other clear liquids as tolerated (See Acceptable Fluids) Slowly increase the amount of protein shake as tolerated   Sip fluids slowly and throughout the day May use sugar substitutes in small amounts (no more than 6 - 8 packets per day; i.e. Splenda)  Fluid Goal The first goal is to drink at least 8 ounces of protein shake/drink per day (or as directed by the nutritionist);  See handout from pre-op Bariatric Education Class for examples of protein shake/drink.   Slowly increase the amount of protein shake you drink as tolerated You may find it easier to slowly sip shakes throughout the day It is important to get your proteins in first Your fluid goal is to drink 64 - 100 ounces of fluid daily It may take a few weeks to build up to this 32 oz (or more) should be clear liquids  And  32 oz (or more) should be full liquids (see below for examples) Liquids should not contain sugar, caffeine, or carbonation  Clear Liquids: Water or Sugar-free flavored water (i.e. Fruit H2O, Propel) Decaffeinated coffee or tea (sugar-free) Dwayne Rocha, Dwayne Rocha, Dwayne Rocha Sugar-free Jell-O Bouillon or broth Sugar-free Popsicle:   *Less than 20 calories each; Limit 1 per day  Full Liquids: Protein Shakes/Drinks + 2 choices per day of other full liquids Full liquids must be: No More Than 12 grams of Carbs per serving  No More Than 3 grams of Fat  per serving Strained low-fat cream soup Non-Fat milk Fat-free Lactaid Milk Sugar-free Rocha (Dwayne Rocha, Dwayne Rocha)      Vitamins and Minerals Start 1 day after surgery unless otherwise directed by your surgeon Bariatric Specific Complete Multivitamins Chewable Calcium Citrate with Vitamin D-3 (Example: 3 Chewable Calcium Plus 600 with Vitamin D-3) Take 500 mg three (3) times a day for a total of 1500 mg each day Do not take all 3 doses of calcium at one time as it may cause constipation, and you can only absorb 500 mg  at a time  Do not mix multivitamins containing iron with calcium supplements; take 2 hours apart  Menstruating women and those at risk for anemia (a blood disease that causes weakness) may need extra iron Talk with your doctor to see if you need more iron If you need extra iron: Total daily Iron recommendation (including Vitamins) is 50 to 100   mg Iron/day Do not stop taking or change any vitamins or minerals until you talk to your nutritionist or surgeon Your nutritionist and/or surgeon must approve all vitamin and mineral supplements   Activity and Exercise: It is important to continue walking at home.  Limit your physical activity as instructed by your doctor.  During this time, use these guidelines: Do not lift anything greater than ten (10) pounds for at least two (2) weeks Do not go back to work or drive until your surgeon says you can You may have sex when you feel comfortable  It is VERY important for male patients to use a reliable birth control method; fertility often increases after surgery  Do not get pregnant for at least 18 months Start exercising as soon as your doctor tells you that you can Make sure your doctor approves any physical activity Start with a simple walking program Walk 5-15 minutes each day, 7 days per week.  Slowly increase until you are walking 30-45 minutes per day Consider joining our BELT program. [phone] or email  [email or web address]   Special Instructions Things to remember:  Use your CPAP when sleeping if this applies to you, do not stop the use of CPAP unless directed by physician after a sleep study [facility] has a free Bariatric Surgery Support Group that meets monthly, the 3rd Thursday, 6 pm.  Please review discharge information for date and location of this meeting. It is very important to keep all follow up appointments with your surgeon, nutritionist, primary care physician, and behavioral health practitioner After the first year, please follow up with your bariatric surgeon and nutritionist at least once a year in order to maintain best weight loss results   [facility]: [phone] [facility]: [phone] Bariatric Nurse Coordinator: [phone]     Enoxaparin (Lovenox) Self- Injection Instructions Wash your hands with soap and water Find a spot on the left or right side of the abdomen  2 inches from umbilicus "belly button" Clean the spot with alcohol swab, LET DRY Pull cap straight off, do not twist (do not let needle touch anything) Ok to leave in air bubble Place syringe in dominant (writing) hand, take other hand to "pinch an inch" (same area that you cleaned with the alcohol swab) Insert the entire needle into the fold of skin at a 90-degree angle Press the plunger all the way down with your thumb while the other hand keeps "pinching" the skin (Make sure to get all the medicine) Pull the needle straight out, then let go of the skin To activate the safety shield, hold the plunger away from yourself (and anyone else if necessary) Press with your thumb until you hear a "click" and the safety shield activates Place the used syringe and cap into the sharps disposal container. 

## 2022-06-10 NOTE — Interval H&P Note (Signed)
History and Physical Interval Note:  06/10/2022 7:10 AM  Dwayne Rocha  has presented today for surgery, with the diagnosis of MORBID OBESITY.  The various methods of treatment have been discussed with the patient and family. After consideration of risks, benefits and other options for treatment, the patient has consented to  Procedure(s): LAPAROSCOPIC ROUX-EN-Y GASTRIC BYPASS WITH UPPER ENDOSCOPY (N/A) HERNIA REPAIR HIATAL (N/A) as a surgical intervention.  The patient's history has been reviewed, patient examined, no change in status, stable for surgery.  I have reviewed the patient's chart and labs.  Questions were answered to the patient's satisfaction.     Valarie Merino

## 2022-06-10 NOTE — Op Note (Signed)
Preoperative diagnosis: Roux-en-Y gastric bypass  Postoperative diagnosis: Same   Procedure: Upper endoscopy   Surgeon: Berna Bue, M.D.  Anesthesia: Gen.   Description of procedure: The endoscope was placed in the mouth and oropharynx and under endoscopic vision it was advanced to the esophagogastric junction which was identified at 39cm from the teeth.  The pouch was tensely insufflated while the upper abdomen was flooded with irrigation and the roux limb clamped to perform a leak test, which was negative. No bubbles were seen.  The staple line was hemostatic and the anastomosis is visibly patent. The pouch measures about 6cm in length and there is no appreciable retained fundus. The lumen was decompressed and the scope was withdrawn without difficulty.    Berna Bue, M.D. General, Bariatric, & Minimally Invasive Surgery [facility]

## 2022-06-10 NOTE — Progress Notes (Signed)

## 2022-06-10 NOTE — Anesthesia Procedure Notes (Signed)
Procedure Name: Intubation Date/Time: 06/10/2022 7:34 AM  Performed by: Lind Covert, CRNAPre-anesthesia Checklist: Patient identified, Emergency Drugs available, Suction available, Patient being monitored and Timeout performed Patient Re-evaluated:Patient Re-evaluated prior to induction Oxygen Delivery Method: Circle system utilized Preoxygenation: Pre-oxygenation with 100% oxygen Induction Type: IV induction Ventilation: Mask ventilation without difficulty and Oral airway inserted - appropriate to patient size Laryngoscope Size: Mac and 4 Grade View: Grade I Tube type: Oral Tube size: 8.0 mm Number of attempts: 1 Airway Equipment and Method: Stylet Placement Confirmation: ETT inserted through vocal cords under direct vision, positive ETCO2 and breath sounds checked- equal and bilateral Secured at: 23 cm Tube secured with: Tape Dental Injury: Teeth and Oropharynx as per pre-operative assessment

## 2022-06-10 NOTE — Transfer of Care (Signed)
Immediate Anesthesia Transfer of Care Note  Patient: Dwayne Rocha  Procedure(s) Performed: LAPAROSCOPIC ROUX-EN-Y GASTRIC BYPASS WITH UPPER ENDOSCOPY (Abdomen) HERNIA REPAIR HIATAL (Abdomen)  Patient Location: PACU  Anesthesia Type:General  Level of Consciousness: sedated  Airway & Oxygen Therapy: Patient Spontanous Breathing and Patient connected to face mask oxygen  Post-op Assessment: Report given to RN and Post -op Vital signs reviewed and stable  Post vital signs: Reviewed and stable  Last Vitals:  Vitals Value Taken Time  BP 141/82 06/10/22 1043  Temp    Pulse 77 06/10/22 1044  Resp 11 06/10/22 1044  SpO2 100 % 06/10/22 1044  Vitals shown include unvalidated device data.  Last Pain:  Vitals:   06/10/22 0553  TempSrc: Oral  PainSc:          Complications: No notable events documented.

## 2022-06-10 NOTE — Progress Notes (Signed)
Unable to ambulate pt this afternoon d/t pt being very sleepy. Also slow w water for the same reason.
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
## 2022-06-10 NOTE — Op Note (Signed)
10 June 2022 Surgeon: Pollyann Savoy. 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   Upper endoscopy by Dr. Fredricka Bonine.   Description of Procedure:  The patient was taken to OR 3 at [facility] and given general anesthesia.  The abdomen was prepped with PCMX and draped sterilely.  A time out was performed.  The abdomen was entered without difficulty with a long 5 cm Ethicon trocar because of his obesity.    The operation began by identifying the ligament of Treitz. I measured 50 cm downstream and divided the bowel with a 6 cm Covidian stapler.  I sutured a Penrose drain along the Roux limb end.  I measured a 1 meter (100 cm) Roux limb and then placed the distal bowels to the BP limb side by side and performed a stapled jejunojejunostomy. The common defect was closed from either end with 4-0 Vicryl using the Endo Stitch. The mesenteric defect was closed with a running 2-0 silk using the Endo Stitch.   The omentum was divided with the harmonic scalpel.  The Nathanson retractor was inserted in the left lateral segment of liver was retracted. The foregut dissection ensued.  No hiatal hernia could be seen.  We were working at the length of our instruments.  About 6 cm along the lessor curvature we entered the retrogastric space.  Two firings of the Covidien without TRS and then the remaining purple loads with TRS completed the pouch  The Roux limb was then brought up with the candycane pointed left and a back row of sutures of 2-0 Vicryl were placed. I opened along the right side of each structure and inserted the 4.5 cm stapler to create the gastrojejunostomy. The common defect was closed from either end with 2-0 Vicryl and a second row was placed anterior to that the Ewald  tube acting as a stent across the anastomosis. The Penrose drain was removed. Peterson's defect could not be identified because of the extreme obesity of the mesentery and omental apron.   Endoscopy was performed by Dr. Fredricka Bonine and a 6 cm tubular pouch was seen.  No bubbles were noted on the outside.  The incisions were injected with Exparel as a TAP block at the beginning of the case and were closed with 4-0 Monocryl and Dermabond  The patient was taken to the recovery room in satisfactory condition.  Matt B. Daphine Deutscher, MD, FACS

## 2022-06-11 ENCOUNTER — Encounter (HOSPITAL_COMMUNITY): Payer: Self-pay | Admitting: Surgery

## 2022-06-11 LAB — GLUCOSE, CAPILLARY
Glucose-Capillary: 162 mg/dL — ABNORMAL HIGH (ref 70–99)
Glucose-Capillary: 168 mg/dL — ABNORMAL HIGH (ref 70–99)
Glucose-Capillary: 170 mg/dL — ABNORMAL HIGH (ref 70–99)
Glucose-Capillary: 171 mg/dL — ABNORMAL HIGH (ref 70–99)
Glucose-Capillary: 197 mg/dL — ABNORMAL HIGH (ref 70–99)
Glucose-Capillary: 219 mg/dL — ABNORMAL HIGH (ref 70–99)
Glucose-Capillary: 226 mg/dL — ABNORMAL HIGH (ref 70–99)

## 2022-06-11 LAB — CBC WITH DIFFERENTIAL/PLATELET
Abs Immature Granulocytes: 0.03 10*3/uL (ref 0.00–0.07)
Basophils Absolute: 0 10*3/uL (ref 0.0–0.1)
Basophils Relative: 0 %
Eosinophils Absolute: 0 10*3/uL (ref 0.0–0.5)
Eosinophils Relative: 0 %
HCT: 34.6 % — ABNORMAL LOW (ref 39.0–52.0)
Hemoglobin: 10.7 g/dL — ABNORMAL LOW (ref 13.0–17.0)
Immature Granulocytes: 0 %
Lymphocytes Relative: 8 %
Lymphs Abs: 0.6 10*3/uL — ABNORMAL LOW (ref 0.7–4.0)
MCH: 30.4 pg (ref 26.0–34.0)
MCHC: 30.9 g/dL (ref 30.0–36.0)
MCV: 98.3 fL (ref 80.0–100.0)
Monocytes Absolute: 0.7 10*3/uL (ref 0.1–1.0)
Monocytes Relative: 10 %
Neutro Abs: 6.1 10*3/uL (ref 1.7–7.7)
Neutrophils Relative %: 82 %
Platelets: 200 10*3/uL (ref 150–400)
RBC: 3.52 MIL/uL — ABNORMAL LOW (ref 4.22–5.81)
RDW: 13.3 % (ref 11.5–15.5)
WBC: 7.5 10*3/uL (ref 4.0–10.5)
nRBC: 0 % (ref 0.0–0.2)
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
## 2022-06-11 NOTE — Progress Notes (Signed)
Patient alert and oriented, pain is controlled. Patient is tolerating fluids, advanced to protein shake today, patient is tolerating well. Reviewed Gastric Bypass discharge instructions with patient and patient is able to articulate understanding. Provided information on BELT program, Support Group and [facility] outpatient pharmacy. All questions answered, will continue to monitor.    

## 2022-06-11 NOTE — TOC Progression Note (Signed)
Transition of Care [facility]) - Progression Note    Patient Details  Name: Dwayne Rocha MRN: [id number] Date of Birth: [birth year]  Transition of Care [facility]) CM/SW Contact  Geni Bers, RN Phone Number: 06/11/2022, 3:11 PM  Clinical Narrative:    Pt from home with family. CM checking co-pay for Lovenox.    Expected Discharge Plan: Home/Self Care Barriers to Discharge: No Barriers Identified  Expected Discharge Plan and Services Expected Discharge Plan: Home/Self Care       Living arrangements for the past 2 months: Single Family Home                                       Social Determinants of Health (SDOH) Interventions    Readmission Risk Interventions     No data to display

## 2022-06-11 NOTE — Inpatient Diabetes Management (Signed)
Inpatient Diabetes Program Recommendations  AACE/ADA: New Consensus Statement on Inpatient Glycemic Control (2015)  Target Ranges:  Prepandial:   less than 140 mg/dL      Peak postprandial:   less than 180 mg/dL (1-2 hours)      Critically ill patients:  140 - 180 mg/dL   Lab Results  Component Value Date   GLUCAP 197 (H) 06/11/2022   HGBA1C 7.0 (H) 05/30/2022    Review of Glycemic Control  Latest Reference Range & Units 06/10/22 19:53 06/11/22 00:05 06/11/22 03:53 06/11/22 07:33  Glucose-Capillary 70 - 99 mg/dL 546 (H) 503 (H) 546 (H) 197 (H)   Diabetes history:  DM 2 Outpatient Diabetes medications:  OmniPod insulin pump MN-6a- 0.8 units/hr 6a-MN-1 units/hr Total basal=22.8 units/24 hours 1 unit/15 grams of CHO Correction factor= 1 unit for every 20 mg/dL  FKCLEX=517 mg/dL Farxiga 10 mg daily Current orders for Inpatient glycemic control:  Insulin pump-  Inpatient Diabetes Program Recommendations:    Spoke with patient at bedside regarding insulin pump.  He has Omnipod insulin pump on and running. He states he had basals running at 50% and now they are back at 100%.  Dr. Claudie Revering manages insulin pump and knows that patient was having bariatric surgery. He instructed patient to continue current settings and f/u within 2 weeks.  Also told patient to call if he has low blood sugars.  At this point blood sugars have not been low and patient is bolusing per pump settings.  Recommend continuation of insulin pump and close f/u with PCP.  Patient will be here overnight so blood sugars and insulin can be monitoring closely.  Will follow up on 06/12/22.    Thanks,  Beryl Meager, RN, BC-ADM Inpatient Diabetes Coordinator Pager [phone]  (8a-5p)

## 2022-06-11 NOTE — Plan of Care (Signed)

## 2022-06-12 LAB — GLUCOSE, CAPILLARY
Glucose-Capillary: 163 mg/dL — ABNORMAL HIGH (ref 70–99)
Glucose-Capillary: 149 mg/dL — ABNORMAL HIGH (ref 70–99)
Glucose-Capillary: 163 mg/dL — ABNORMAL HIGH (ref 70–99)

## 2022-06-12 LAB — CBC WITH DIFFERENTIAL/PLATELET
Abs Immature Granulocytes: 0.01 10*3/uL (ref 0.00–0.07)
Basophils Absolute: 0 10*3/uL (ref 0.0–0.1)
Basophils Relative: 0 %
Eosinophils Absolute: 0 10*3/uL (ref 0.0–0.5)
Eosinophils Relative: 1 %
HCT: 31.2 % — ABNORMAL LOW (ref 39.0–52.0)
Hemoglobin: 9.6 g/dL — ABNORMAL LOW (ref 13.0–17.0)
Immature Granulocytes: 0 %
Lymphocytes Relative: 12 %
Lymphs Abs: 0.9 10*3/uL (ref 0.7–4.0)
MCH: 30.2 pg (ref 26.0–34.0)
MCHC: 30.8 g/dL (ref 30.0–36.0)
MCV: 98.1 fL (ref 80.0–100.0)
Monocytes Absolute: 0.9 10*3/uL (ref 0.1–1.0)
Monocytes Relative: 13 %
Neutro Abs: 5.1 10*3/uL (ref 1.7–7.7)
Neutrophils Relative %: 74 %
Platelets: 180 10*3/uL (ref 150–400)
RBC: 3.18 MIL/uL — ABNORMAL LOW (ref 4.22–5.81)
RDW: 13.2 % (ref 11.5–15.5)
WBC: 7 10*3/uL (ref 4.0–10.5)
nRBC: 0 % (ref 0.0–0.2)

## 2022-06-12 MED ORDER — ENOXAPARIN SODIUM 60 MG/0.6ML IJ SOSY: 60.0000 mg | PREFILLED_SYRINGE | Freq: Two times a day (BID) | INTRAMUSCULAR | 0 refills | Status: DC

## 2022-06-12 MED ORDER — OXYCODONE HCL 5 MG PO TABS: 5.0000 mg | ORAL_TABLET | Freq: Four times a day (QID) | ORAL | 0 refills | Status: DC | PRN

## 2022-06-12 MED ORDER — ONDANSETRON 4 MG PO TBDP: 4.0000 mg | ORAL_TABLET | Freq: Four times a day (QID) | ORAL | 0 refills | Status: DC | PRN

## 2022-06-12 MED ORDER — PANTOPRAZOLE SODIUM 40 MG PO TBEC: 40.0000 mg | DELAYED_RELEASE_TABLET | Freq: Every day | ORAL | 0 refills | Status: DC
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
## 2022-06-12 NOTE — Progress Notes (Signed)
Pt refused motility all night. Pt complains of abdominal pain to left upper quadrant at 8/10 pain level. PRN medications given as ordered. Will continue to monitor.

## 2022-06-12 NOTE — Progress Notes (Signed)
Patient alert and oriented, Post op day 2.  Provided support and encouragement.  Encouraged pulmonary toilet, ambulation and small sips of liquids.  All questions answered.  Will continue to monitor.  Reviewed Lovenox teaching kit and pt reviewed Lovenox "Patient-Self Injection Video".

## 2022-06-12 NOTE — TOC Benefit Eligibility Note (Signed)
Transition of Care [facility]) Benefit Eligibility Note    Patient Details  Name: Dwayne Rocha MRN: [id number] Date of Birth: Apr 01, 1976   Medication/Dose: Lovenox 60 mg x28 days suggested, Enoxaparin is on formulary and copay is $50.00  Covered?: Yes  Tier: Other  Prescription Coverage Preferred Pharmacy: local  Spoke with Person/Company/Phone Number:: [place]/ [employer] [phone]  Co-Pay: $50.00  Prior Approval: No  Deductible: Met       Kerin Salen Phone Number: 06/12/2022, 10:54 AM

## 2022-06-12 NOTE — Discharge Summary (Signed)
Physician Discharge Summary  Patient ID: Dwayne Rocha MRN: [id number] DOB/AGE: 44/29/1977 44 y.o.  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  Was begun on liquids when he reached the floor and these were advanced to full liquids prior to discharge.  Concerns regarding DVT prophylaxis addressed with post discharge Lovenox.    Consults: pharm and diabetes tx program  Significant Diagnostic Studies: none    Discharge Exam: Blood pressure (!) 148/73, pulse (!) 104, temperature 99.4 F (37.4 C), temperature source Oral, resp. rate 18, height 5\' 10"  (1.778 m), weight (!) 165.8 kg, SpO2 98 %. Incisons OK with dermabond.    Disposition: Discharge disposition: 01-Home or Self Care       Discharge Instructions     Ambulate hourly while awake   Complete by: As directed    Call MD for:  difficulty breathing, headache or visual disturbances   Complete by: As directed    Call MD for:  persistant dizziness or light-headedness   Complete by: As directed    Call MD for:  persistant nausea and vomiting   Complete by: As directed    Call MD for:  redness, tenderness, or signs of infection (pain, swelling, redness, odor or green/yellow discharge around incision site)   Complete by: As directed    Call MD for:  severe uncontrolled pain   Complete by: As directed    Call MD for:  temperature >101 F   Complete by: As directed    Diet bariatric full liquid   Complete by: As directed    Discharge wound care:   Complete by: As directed    You may shower and get incisions wet then you get home.   Incentive spirometry   Complete by: As directed     Perform hourly while awake      Allergies as of 06/12/2022       Reactions   Lyrica [pregabalin] Swelling        Medication List     TAKE these medications    acetaminophen 500 MG tablet Commonly known as: TYLENOL Take 1,000 mg by mouth every 6 (six) hours as needed for mild pain or moderate pain.   DULoxetine 30 MG capsule Commonly known as: CYMBALTA Take 30 mg by mouth 2 (two) times daily.   enoxaparin 60 MG/0.6ML injection Commonly known as: LOVENOX Inject 0.6 mLs (60 mg total) into the skin every 12 (twelve) hours for 14 days.   Farxiga 10 MG Tabs tablet Generic drug: dapagliflozin propanediol Take 10 mg by mouth daily. Notes to patient: Monitor Blood Pressure Daily and keep a log for primary care physician.  You may need to make changes to your medications with rapid weight loss.     FreeStyle Libre 14 Day Sensor Misc Apply topically 3 (three) times daily.   HumaLOG KwikPen 100 UNIT/ML KwikPen Generic drug: insulin lispro Inject into the skin continuous. Used with Omnipod pump, 150 units every 3 days Notes to patient: Monitor Blood Sugar Frequently and keep a log for primary care physician, you may need to adjust medication dosage with rapid weight loss.     hydrALAZINE 100 MG tablet Commonly  known as: APRESOLINE Take 1 tablet (100 mg total) by mouth every 8 (eight) hours. Notes to patient: Monitor Blood Pressure Daily and keep a log for primary care physician.  Monitor for symptoms of dehydration.  You may need to make changes to your medications with rapid weight loss.     HYDROcodone-acetaminophen 10-325 MG tablet Commonly known as: NORCO Take 1 tablet by mouth every 6 (six) hours as needed for moderate pain.   INSULIN SYRINGE 1CC/31GX5/16" 31G X 5/16" 1 ML Misc   metoprolol tartrate 100 MG tablet Commonly known as: LOPRESSOR Take 100 mg by mouth 2 (two) times daily. Notes to patient: Monitor Blood Pressure Daily and keep a log for primary care physician.   You may need to make changes to your medications with rapid weight loss.     Olmesartan-amLODIPine-HCTZ 40-10-25 MG Tabs Take 1 tablet by mouth daily. Notes to patient: Monitor Blood Pressure Daily and keep a log for primary care physician.  You may need to make changes to your medications with rapid weight loss.     ondansetron 4 MG disintegrating tablet Commonly known as: ZOFRAN-ODT Take 1 tablet (4 mg total) by mouth every 6 (six) hours as needed for nausea or vomiting.   oxyCODONE 5 MG immediate release tablet Commonly known as: Oxy IR/ROXICODONE Take 1 tablet (5 mg total) by mouth every 6 (six) hours as needed for severe pain.   pantoprazole 40 MG tablet Commonly known as: PROTONIX Take 40 mg by mouth 2 (two) times daily. What changed: Another medication with the same name was added. Make sure you understand how and when to take each.   pantoprazole 40 MG tablet Commonly known as: PROTONIX Take 1 tablet (40 mg total) by mouth daily. What changed: You were already taking a medication with the same name, and this prescription was added. Make sure you understand how and when to take each.   Vitamin D 50 MCG (2000 UT) tablet Take 2,000 Units by mouth daily.               Discharge Care Instructions  (From admission, onward)           Start     Ordered   06/12/22 0000  Discharge wound care:       Comments: You may shower and get incisions wet then you get home.   06/12/22 1583            Follow-up Information     Luretha Murphy, MD. Go on 06/26/2022.   Specialty: General Surgery Why: at 9am in the [facility] OFFICE. Please arrive 15 minutes prior to your appointment time.  Thank you. Contact information: [address] [place] [address] [phone]         Luretha Murphy, MD. Go on 07/31/2022.   Specialty: General Surgery Why: at 1:30pm in the [facility] OFFICE.  Please arrive 15 minutes prior to your appointment time.  Thank  you. Contact information: [address] [place] [address] [phone]                 Signed: Valarie Merino 06/12/2022, 7:49 AM

## 2022-06-17 ENCOUNTER — Telehealth (HOSPITAL_COMMUNITY): Payer: Self-pay | Admitting: *Deleted

## 2022-06-17 NOTE — Telephone Encounter (Signed)
1.  Tell me about your pain and pain management? Pt denies any pain.  2.  Let's talk about fluid intake.  How much total fluid are you taking in? Pt states that he is getting in at least 64oz of fluid including protein shakes, bottled water, and yogurt. Pt instructed to assess status and suggestions daily utilizing Hydration Action Plan on discharge folder and to call [facility] if in the "red zone".   3.  How much protein have you taken in the last 2 days? Pt states he is meeting his goal of 80g of protein each day with the protein shakes and protein powder.  4.  Have you had nausea?  Tell me about when have experienced nausea and what you did to help? Pt denies nausea.   5.  Has the frequency or color changed with your urine? Pt states that he is urinating "fine" with no changes in frequency or urgency.     6.  Tell me what your incisions look like? "Incisions look fine". Pt denies a fever, chills.  Pt states incisions are not swollen, open, or draining.  Pt encouraged to call [facility] if incisions change.   7.  Have you been passing gas? BM? Pt states that he is having BMs. Last BM 06/17/22.     8.  If a problem or question were to arise who would you call?  Do you know contact numbers for [facility], [facility], and [facility]? Pt denies dehydration symptoms.  Pt can describe s/sx of dehydration.  Pt knows to call [facility] for surgical, [facility] for nutrition, and [facility] for non-urgent questions or concerns.   9.  How has the walking going? Pt states he is walking around and able to be active without difficulty.   10. Are you still using your incentive spirometer?  If so, how often? Pt states that he forgot the I.S. at discharge.  Discussed with the pt about TCDB and being intentional about performing the lung exercise at least 10x every hour while awake until she sees the surgeon.  11.  How are your vitamins and calcium going?  How are you taking them? Pt states that he is taking his supplements and vitamins without  difficulty.  LOVENOX: Pt states that he is taking the Lovenox injections without difficulty.  Reinforced education about taking injections q12h and rotating injection sites.  Pt also instructed to monitor for unusual bruising and/or signs of bleeding.  Reminded patient that the first 30 days post-operatively are important for successful recovery.  Practice good hand hygiene, wearing a mask when appropriate (since optional in most places), and minimizing exposure to people who live outside of the home, especially if they are exhibiting any respiratory, GI, or illness-like symptoms.

## 2022-06-25 ENCOUNTER — Encounter: Payer: Self-pay | Admitting: Dietician

## 2022-06-25 ENCOUNTER — Encounter: Payer: 59 | Attending: Surgery | Admitting: Dietician

## 2022-06-25 DIAGNOSIS — E0840 Diabetes mellitus due to underlying condition with diabetic neuropathy, unspecified: Secondary | ICD-10-CM | POA: Insufficient documentation

## 2022-06-25 DIAGNOSIS — Z6841 Body Mass Index (BMI) 40.0 and over, adult: Secondary | ICD-10-CM | POA: Diagnosis not present

## 2022-06-25 NOTE — Progress Notes (Signed)
2 Week Post-Operative Nutrition Class   Patient was seen on 06/25/2022 for Post-Operative Nutrition education at the [facility].    Surgery date: 06/10/2022 Surgery type: RYGB  Anthropometrics  Start weight at [facility]: 378.8 lbs (date: 01/30/2022 with prosthesis)  Height: 70 in Weight: 349.3 pounds (with prosthesis)  BMI: 50.12 kg/m2     Clinical  Sx: RYGB Medical hx: DM, CKD stage 3, sleep apnea Medications: Omnipod,  farxiga Labs:  03/13/2022  Potassium 5.9; Na 140 (WNL); GFR 25; Creatinine 3.00; BUN 43; Glucose 93; Ca 9.1; Phosphorus 4.3; Hemoglobin 11.4 Notable signs/symptoms: possibly needs another fitting for his prosthesis Any previous deficiencies? No      The following the learning objectives were met by the patient during this course: Identifies Phase 3 (Soft, High Proteins) Dietary Goals and will begin from 2 weeks post-operatively to 2 months post-operatively Identifies appropriate sources of fluids and proteins  Identifies appropriate fat sources and healthy verses unhealthy fat types   States protein recommendations and appropriate sources post-operatively Identifies the need for appropriate texture modifications, mastication, and bite sizes when consuming solids Identifies appropriate fat consumption and sources Identifies appropriate multivitamin and calcium sources post-operatively Describes the need for physical activity post-operatively and will follow MD recommendations States when to call healthcare provider regarding medication questions or post-operative complications   Handouts given during class include: Phase 3A: Soft, High Protein Diet Handout Phase 3 High Protein Meals Healthy Fats   Follow-Up Plan: Patient will follow-up at [facility] in 6 weeks for 2 month post-op nutrition visit for diet advancement per MD.

## 2022-07-01 ENCOUNTER — Telehealth: Payer: Self-pay | Admitting: Dietician

## 2022-07-01 NOTE — Telephone Encounter (Signed)
RD called pt to verify fluid intake once starting soft, solid proteins 2 week post-bariatric surgery.   Daily Fluid intake: 64+ oz Daily Protein intake: 80 grams (supplementing with protein shakes) Bowel Habits: pt states not often enough.  Pt states he is using Milk of Magnesia. Pt encouraged to continue Milk of Magnesia, and call doctor if constipation continues.  Concerns/issues: no other issues or concerns.

## 2022-08-06 ENCOUNTER — Encounter: Payer: 59 | Attending: Surgery | Admitting: Skilled Nursing Facility1

## 2022-08-06 ENCOUNTER — Encounter: Payer: Self-pay | Admitting: Skilled Nursing Facility1

## 2022-08-06 DIAGNOSIS — E0840 Diabetes mellitus due to underlying condition with diabetic neuropathy, unspecified: Secondary | ICD-10-CM | POA: Diagnosis present

## 2022-08-06 DIAGNOSIS — N1831 Chronic kidney disease, stage 3a: Secondary | ICD-10-CM | POA: Diagnosis present

## 2022-08-06 DIAGNOSIS — Z6841 Body Mass Index (BMI) 40.0 and over, adult: Secondary | ICD-10-CM | POA: Insufficient documentation

## 2022-08-06 NOTE — Progress Notes (Signed)
Bariatric Nutrition Follow-Up Visit Medical Nutrition Therapy   Surgery date: 06/10/2022 Surgery type: RYGB  Anthropometrics  Start weight at [facility]: 378.8 lbs (date: 01/30/2022 with prosthesis)  Height: 70 in Weight: 325 pounds (with prosthesis)  BMI: 46.68 kg/m2     Clinical  Sx: RYGB Medical hx: DM, CKD stage 3, sleep apnea Medications: farxiga, lowered blood pressure medicines, no longer taking insulin  Labs:  03/13/2022  Potassium 5.9; Na 140 (WNL); GFR 26; Creatinine 2.93; BUN 43; Glucose 93; Ca 9.1; Phosphorus 4.3; Hemoglobin 11.4 Notable signs/symptoms: possibly needs another fitting for his prosthesis Any previous deficiencies? No    Lifestyle & Dietary Hx  Pt states he saw his nephrologist about 2 weeks after his surgery.   Pt states prior to surgery he was in water therapy due to lower back pain but had to stop from the surgery so is in the middle of trying to get back into that.  Pt states his prosthesis is fitting okay now but is looking forward to getting an athletic one where the ankle will move.  Pt states if he does not move he will not reach his goals so even with all of his barriers he is still looking for ways to move more.  Pt states he has an issue with eating something today but not the next day.  Pt states he cannot tolerate seafood. Pt states he had a bladder infection.    Estimated daily fluid intake: 80 oz Estimated daily protein intake: 80 g Supplements: multi and calcium (advised to clear this with his nephrologist) Current average weekly physical activity: ADL's due to back pain but trying a few minute walks    24-Hr Dietary Recall First Meal: yogurt or egg + [place] + cheese Snack:   Second Meal: hamburger patty or frozen meal or protein shake  Snack:   Third Meal: beans or chicken salad Snack:  Beverages: water, perfect lemon drink, cold coffee + sugar free syrup  Post-Op Goals/ Signs/ Symptoms Using straws: no Drinking while eating:  no Chewing/swallowing difficulties: no Changes in vision: no Changes to mood/headaches: no Hair loss/changes to skin/nails: no Difficulty focusing/concentrating: no Sweating: no Limb weakness: no Dizziness/lightheadedness: no Palpitations: no  Carbonated/caffeinated beverages: no N/V/D/C/Gas: nausea Abdominal pain: no Dumping syndrome: no    NUTRITION DIAGNOSIS  Overweight/obesity ([place]-3.3) related to past poor dietary habits and physical inactivity as evidenced by completed bariatric surgery and following dietary guidelines for continued weight loss and healthy nutrition status.     NUTRITION INTERVENTION Nutrition counseling (C-1) and education (E-2) to facilitate bariatric surgery goals, including: The importance of consuming adequate calories as well as certain nutrients daily due to the body's need for essential vitamins, minerals, and fats The importance of daily physical activity and to reach a goal of at least 150 minutes of moderate to vigorous physical activity weekly (or as directed by their physician) due to benefits such as increased musculature and improved lab values The importance of intuitive eating specifically learning hunger-satiety cues and understanding the importance of learning a new body: The importance of mindful eating to avoid grazing behaviors  Encouraged patient to honor their body's internal hunger and fullness cues.  Throughout the day, check in mentally and rate hunger. Stop eating when satisfied not full regardless of how much food is left on the plate.  Get more if still hungry 20-30 minutes later.  The key is to honor satisfaction so throughout the meal, rate fullness factor and stop when comfortably satisfied not physically  full. The key is to honor hunger and fullness without any feelings of guilt or shame.  Pay attention to what the internal cues are, rather than any external factors. This will enhance the confidence you have in listening to your own  body and following those internal cues enabling you to increase how often you eat when you are hungry not out of appetite and stop when you are satisfied not full.  Encouraged pt to continue to eat balanced meals inclusive of non starchy vegetables 2 times a day 7 days a week Encouraged pt to choose lean protein sources: limiting beef, pork, sausage, hotdogs, and lunch meat Encourage pt to choose healthy fats such as plant based limiting animal fats Encouraged pt to continue to drink a minium 64 fluid ounces with half being plain water to satisfy proper hydration    Handouts Provided Include  Detailed Meal ideas sheet  Learning Style & Readiness for Change Teaching method utilized: Visual & Auditory  Demonstrated degree of understanding via: Teach Back  Readiness Level: action Barriers to learning/adherence to lifestyle change: stressful   RD's Notes for Next Visit Assess adherence to pt chosen goals    MONITORING & EVALUATION Dietary intake, weekly physical activity, body weight  Next Steps Patient is to follow-up in 3 months

## 2022-08-07 ENCOUNTER — Other Ambulatory Visit: Payer: Self-pay | Admitting: Internal Medicine

## 2022-08-08 LAB — CBC
HCT: 36.7 % — ABNORMAL LOW (ref 38.5–50.0)
Hemoglobin: 12.1 g/dL — ABNORMAL LOW (ref 13.2–17.1)
MCH: 30.1 pg (ref 27.0–33.0)
MCHC: 33 g/dL (ref 32.0–36.0)
MCV: 91.3 fL (ref 80.0–100.0)
MPV: 10.6 fL (ref 7.5–12.5)
Platelets: 271 10*3/uL (ref 140–400)
RBC: 4.02 10*6/uL — ABNORMAL LOW (ref 4.20–5.80)
RDW: 12.9 % (ref 11.0–15.0)
WBC: 4.3 10*3/uL (ref 3.8–10.8)

## 2022-08-08 LAB — TSH: TSH: 0.38 mIU/L — ABNORMAL LOW (ref 0.40–4.50)

## 2022-08-08 LAB — COMPLETE METABOLIC PANEL WITH GFR
AG Ratio: 1.3 (calc) (ref 1.0–2.5)
ALT: 26 U/L (ref 9–46)
AST: 19 U/L (ref 10–40)
Albumin: 3.9 g/dL (ref 3.6–5.1)
Alkaline phosphatase (APISO): 138 U/L — ABNORMAL HIGH (ref 36–130)
BUN/Creatinine Ratio: 9 (calc) (ref 6–22)
BUN: 24 mg/dL (ref 7–25)
CO2: 18 mmol/L — ABNORMAL LOW (ref 20–32)
Calcium: 8.9 mg/dL (ref 8.6–10.3)
Chloride: 105 mmol/L (ref 98–110)
Creat: 2.53 mg/dL — ABNORMAL HIGH (ref 0.60–1.29)
Globulin: 3 g/dL (calc) (ref 1.9–3.7)
Glucose, Bld: 173 mg/dL — ABNORMAL HIGH (ref 65–99)
Potassium: 5.2 mmol/L (ref 3.5–5.3)
Sodium: 137 mmol/L (ref 135–146)
Total Bilirubin: 0.5 mg/dL (ref 0.2–1.2)
Total Protein: 6.9 g/dL (ref 6.1–8.1)
eGFR: 31 mL/min/{1.73_m2} — ABNORMAL LOW (ref >=60)

## 2022-08-08 LAB — PSA: PSA: 2.17 ng/mL (ref <=4.00)

## 2022-08-08 LAB — LIPID PANEL
Cholesterol: 183 mg/dL (ref <200)
HDL: 28 mg/dL — ABNORMAL LOW (ref >=40)
LDL Cholesterol (Calc): 123 mg/dL (calc) — ABNORMAL HIGH
Non-HDL Cholesterol (Calc): 155 mg/dL (calc) — ABNORMAL HIGH (ref <130)
Total CHOL/HDL Ratio: 6.5 (calc) — ABNORMAL HIGH (ref <5.0)
Triglycerides: 203 mg/dL — ABNORMAL HIGH (ref <150)

## 2022-08-08 LAB — VITAMIN D 25 HYDROXY (VIT D DEFICIENCY, FRACTURES): Vit D, 25-Hydroxy: 15 ng/mL — ABNORMAL LOW (ref 30–100)

## 2022-08-12 ENCOUNTER — Encounter: Payer: Self-pay | Admitting: Pulmonary Disease

## 2022-08-12 ENCOUNTER — Ambulatory Visit (INDEPENDENT_AMBULATORY_CARE_PROVIDER_SITE_OTHER): Payer: 59 | Admitting: Pulmonary Disease

## 2022-08-12 VITALS — BP 126/80 | HR 106 | Ht 70.0 in | Wt 329.0 lb

## 2022-08-12 DIAGNOSIS — G4733 Obstructive sleep apnea (adult) (pediatric): Secondary | ICD-10-CM
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
## 2022-08-12 NOTE — Patient Instructions (Signed)
Continue weight loss efforts  The time to consider repeating your sleep study will be when you get to close to your goal weight  Call us with significant concerns  I will see you back in about 4 months

## 2022-08-29 ENCOUNTER — Encounter (HOSPITAL_BASED_OUTPATIENT_CLINIC_OR_DEPARTMENT_OTHER): Payer: Self-pay | Admitting: Physical Therapy

## 2022-08-29 ENCOUNTER — Other Ambulatory Visit: Payer: Self-pay

## 2022-08-29 ENCOUNTER — Ambulatory Visit (HOSPITAL_BASED_OUTPATIENT_CLINIC_OR_DEPARTMENT_OTHER): Payer: 59 | Attending: Orthopedic Surgery | Admitting: Physical Therapy

## 2022-08-29 DIAGNOSIS — M5459 Other low back pain: Secondary | ICD-10-CM | POA: Insufficient documentation

## 2022-08-29 DIAGNOSIS — M6281 Muscle weakness (generalized): Secondary | ICD-10-CM | POA: Diagnosis not present

## 2022-08-29 DIAGNOSIS — M5451 Vertebrogenic low back pain: Secondary | ICD-10-CM | POA: Diagnosis not present
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
## 2022-09-03 ENCOUNTER — Encounter (HOSPITAL_BASED_OUTPATIENT_CLINIC_OR_DEPARTMENT_OTHER): Payer: Self-pay | Admitting: Physical Therapy

## 2022-09-03 ENCOUNTER — Ambulatory Visit (HOSPITAL_BASED_OUTPATIENT_CLINIC_OR_DEPARTMENT_OTHER): Payer: 59 | Admitting: Physical Therapy

## 2022-09-03 DIAGNOSIS — M5451 Vertebrogenic low back pain: Secondary | ICD-10-CM | POA: Diagnosis not present

## 2022-09-03 DIAGNOSIS — M5459 Other low back pain: Secondary | ICD-10-CM

## 2022-09-03 DIAGNOSIS — M6281 Muscle weakness (generalized): Secondary | ICD-10-CM
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
## 2022-09-09 ENCOUNTER — Ambulatory Visit (HOSPITAL_BASED_OUTPATIENT_CLINIC_OR_DEPARTMENT_OTHER): Payer: 59 | Admitting: Physical Therapy

## 2022-09-13 ENCOUNTER — Ambulatory Visit (HOSPITAL_BASED_OUTPATIENT_CLINIC_OR_DEPARTMENT_OTHER): Payer: 59 | Attending: Orthopedic Surgery | Admitting: Physical Therapy

## 2022-09-13 DIAGNOSIS — M5459 Other low back pain: Secondary | ICD-10-CM | POA: Insufficient documentation

## 2022-09-13 DIAGNOSIS — M6281 Muscle weakness (generalized): Secondary | ICD-10-CM | POA: Diagnosis present
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
## 2022-09-18 ENCOUNTER — Ambulatory Visit (HOSPITAL_BASED_OUTPATIENT_CLINIC_OR_DEPARTMENT_OTHER): Payer: 59 | Admitting: Physical Therapy

## 2022-09-20 ENCOUNTER — Ambulatory Visit (HOSPITAL_BASED_OUTPATIENT_CLINIC_OR_DEPARTMENT_OTHER): Payer: 59 | Admitting: Physical Therapy

## 2022-09-25 ENCOUNTER — Ambulatory Visit (HOSPITAL_BASED_OUTPATIENT_CLINIC_OR_DEPARTMENT_OTHER): Payer: 59 | Admitting: Physical Therapy

## 2022-09-27 ENCOUNTER — Encounter (HOSPITAL_BASED_OUTPATIENT_CLINIC_OR_DEPARTMENT_OTHER): Payer: Self-pay | Admitting: Physical Therapy

## 2022-10-01 ENCOUNTER — Ambulatory Visit (HOSPITAL_BASED_OUTPATIENT_CLINIC_OR_DEPARTMENT_OTHER): Payer: 59 | Admitting: Physical Therapy

## 2022-10-09 ENCOUNTER — Ambulatory Visit (HOSPITAL_BASED_OUTPATIENT_CLINIC_OR_DEPARTMENT_OTHER): Payer: 59 | Admitting: Physical Therapy

## 2022-10-11 ENCOUNTER — Ambulatory Visit (HOSPITAL_BASED_OUTPATIENT_CLINIC_OR_DEPARTMENT_OTHER): Payer: 59 | Admitting: Physical Therapy

## 2022-10-11 ENCOUNTER — Encounter (HOSPITAL_BASED_OUTPATIENT_CLINIC_OR_DEPARTMENT_OTHER): Payer: Self-pay | Admitting: Physical Therapy

## 2022-10-15 ENCOUNTER — Ambulatory Visit (HOSPITAL_BASED_OUTPATIENT_CLINIC_OR_DEPARTMENT_OTHER): Payer: 59 | Attending: Orthopedic Surgery | Admitting: Physical Therapy

## 2022-10-15 DIAGNOSIS — M6281 Muscle weakness (generalized): Secondary | ICD-10-CM | POA: Insufficient documentation

## 2022-10-15 DIAGNOSIS — M5459 Other low back pain: Secondary | ICD-10-CM | POA: Insufficient documentation

## 2022-10-16 ENCOUNTER — Ambulatory Visit: Payer: 59 | Admitting: Podiatry

## 2022-10-17 ENCOUNTER — Ambulatory Visit (INDEPENDENT_AMBULATORY_CARE_PROVIDER_SITE_OTHER): Payer: 59

## 2022-10-17 ENCOUNTER — Ambulatory Visit (INDEPENDENT_AMBULATORY_CARE_PROVIDER_SITE_OTHER): Payer: 59 | Admitting: Podiatry

## 2022-10-17 DIAGNOSIS — E0840 Diabetes mellitus due to underlying condition with diabetic neuropathy, unspecified: Secondary | ICD-10-CM | POA: Diagnosis not present

## 2022-10-17 DIAGNOSIS — M25571 Pain in right ankle and joints of right foot: Secondary | ICD-10-CM

## 2022-10-17 DIAGNOSIS — M21071 Valgus deformity, not elsewhere classified, right ankle: Secondary | ICD-10-CM | POA: Diagnosis not present

## 2022-10-17 NOTE — Progress Notes (Signed)
  Subjective:  Patient ID: Dwayne Rocha, male    DOB: 13-Feb-1976,  MRN: [id number]  Chief Complaint  Patient presents with   Nail Problem    6 month DFC    46 y.o. male presents with the above complaint. Pt presenting for routine 6 mo  DM exam, high risk exam. Pt has history of left ankle charcot and subsequently required BKA LLE. He reports he is overall doing well but is having some increased angulation of the right foot at the ankle wanted to get evalauted. Does not have an AFO brace for R ankle.   Objective:  Physical Exam: warm, good capillary refill S/p LLE BKA nail exam onychomycosis of the toenails right fooot DP pulses palpable, PT pulses palpable, and protective sensation absent  Right Foot: S/p Partail hallux amputation right foot, mild valgus angulation of right ankle, no gross instability noted.   10/17/2022 XR R ankle 3 views weightbearing AP lateral mortise Findings: No evidence of Charcot breakdown of the right ankle.  Joints appear overall well aligned though with some osteoarthritis evident in the ankle and subtalar joints as well as the midtarsal joints.  Assessment:   1. Acquired valgus deformity of right ankle   2. Type 2 diabetes mellitus with diabetic polyneuropathy, without long-term current use of insulin (HCC)      Plan:  Patient was evaluated and treated and all questions answered.  #Valgus deformity of right foot at ankle joint -Discussed with the patient that I do not feel he is experiencing Charcot breakdown of the right ankle. -Recommend AFO brace for the right ankle due to mild to moderate valgus deformity. -Prescription provided to [facility] to fabricate AFO device for the patient. -Continue to monitor for worsening of the deformity and if this occurs follow-up sooner than next scheduled appointment. -Otherwise we will have the patient follow-up in 6 months for routine foot check/high risk footcare         Corinna Gab, DPM [name]

## 2022-11-02 IMAGING — RF DG UGI W SINGLE CM
5 of 11 series · 7 of 24 positions shown · non-contrast
Comparison: NONE.

CLINICAL DATA: Morbid obesity.  Pre bariatric surgery evaluation

EXAM:
DG UGI W SINGLE CM
TECHNIQUE: Scout radiograph was obtained. Combined double and single contrast
examination was performed using effervescent crystals, high-density
barium and thin liquid barium. This exam was performed by [REDACTED], and was supervised and interpreted by Dr. Holm.
FLUOROSCOPY:
Radiation Exposure Index (as provided by the fluoroscopic device):
106.3 mGy Kerma

[Series 1: t abdomen · 1 of 1 slices shown (1 of 2)]
[im 1/1]
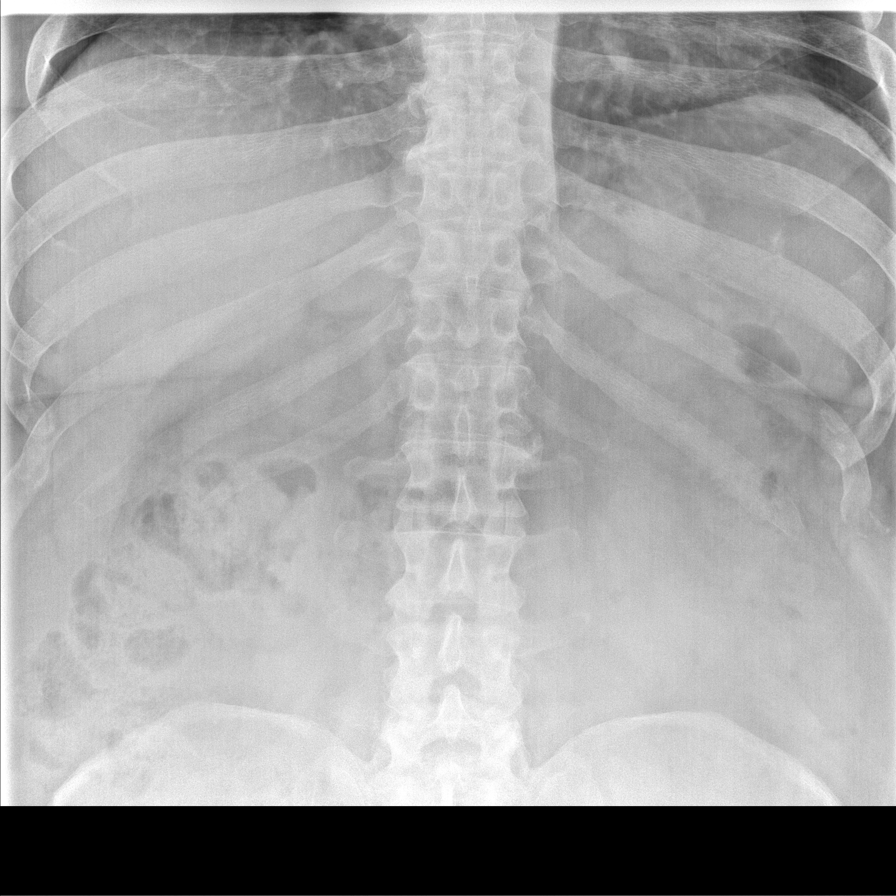

[Series 2: t abdomen · 1 of 1 slices shown (2 of 2)]
[im 1/1]
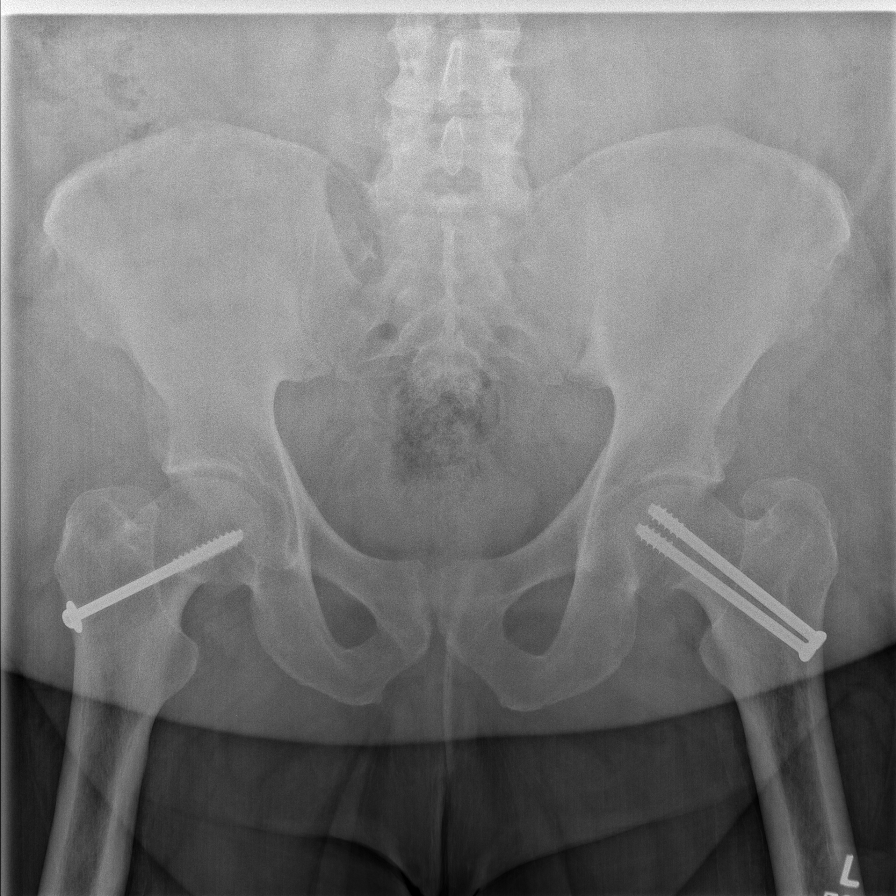

[Series 7: fluoro_barium 2fps_bw · 2 of 3 frames shown (1 of 3)]
[frame 2/3]
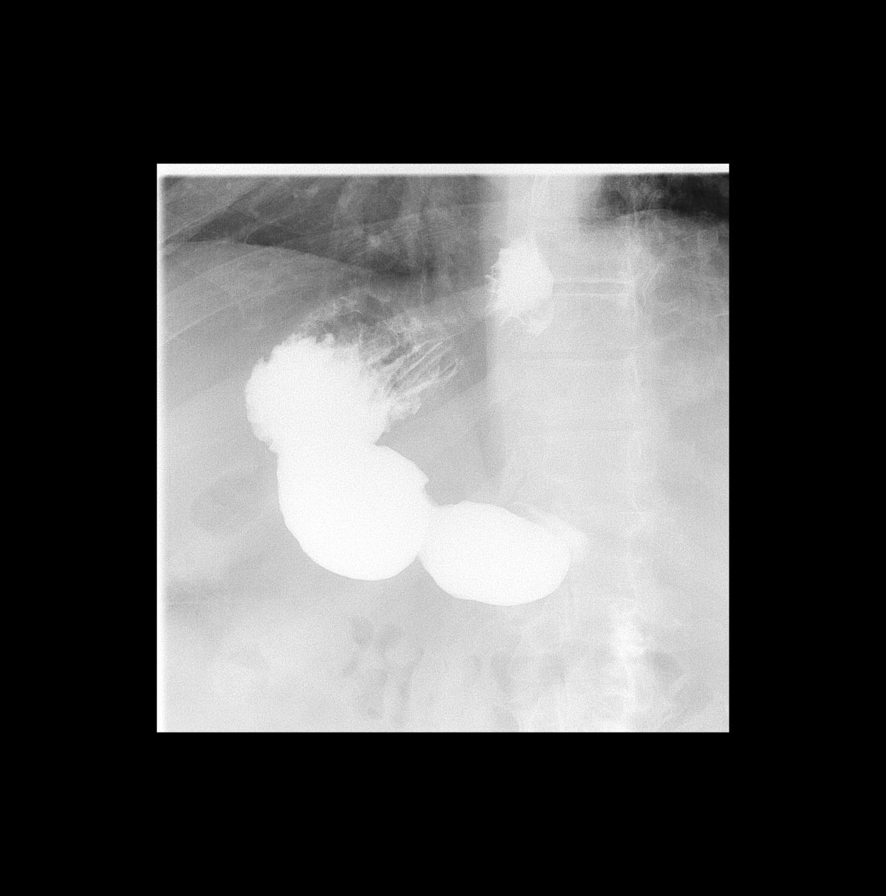
[frame 3/3]
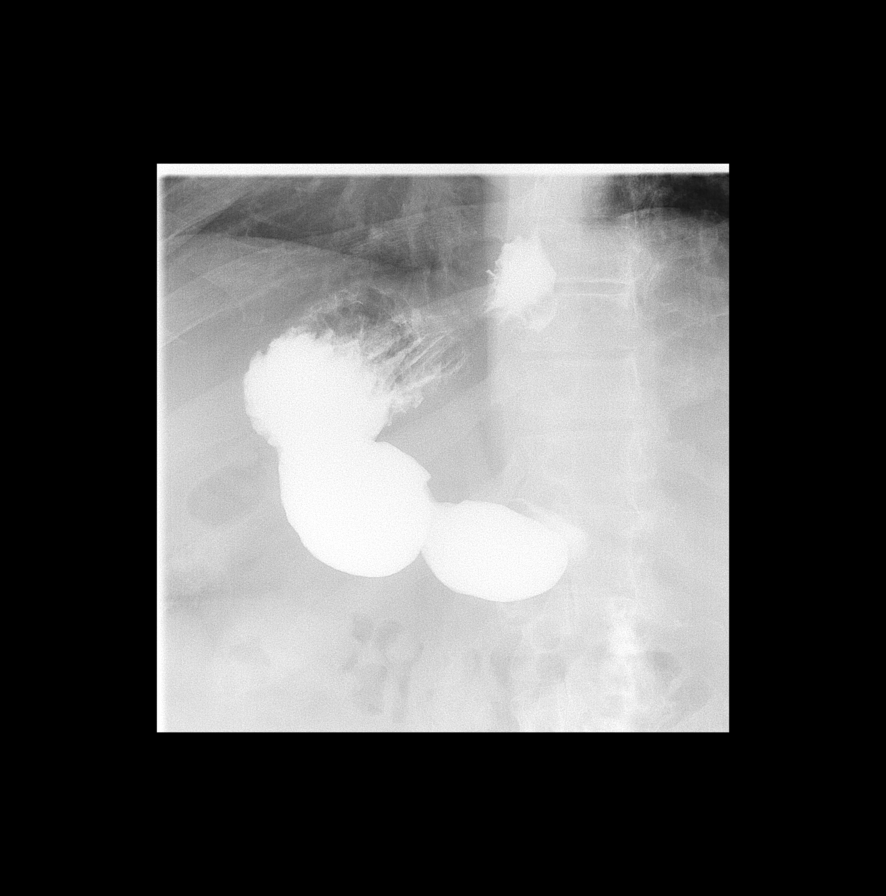

[Series 8: fluoro_barium 2fps_bw · 2 of 4 frames shown (2 of 3)]
[frame 3/4]
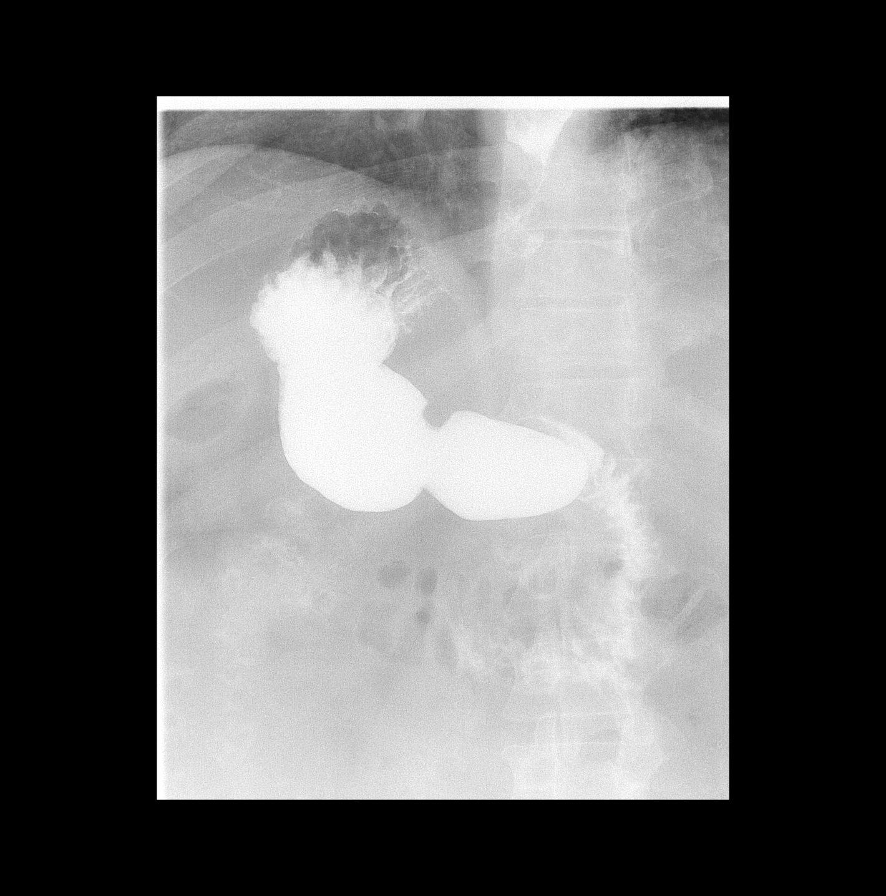
[frame 4/4]
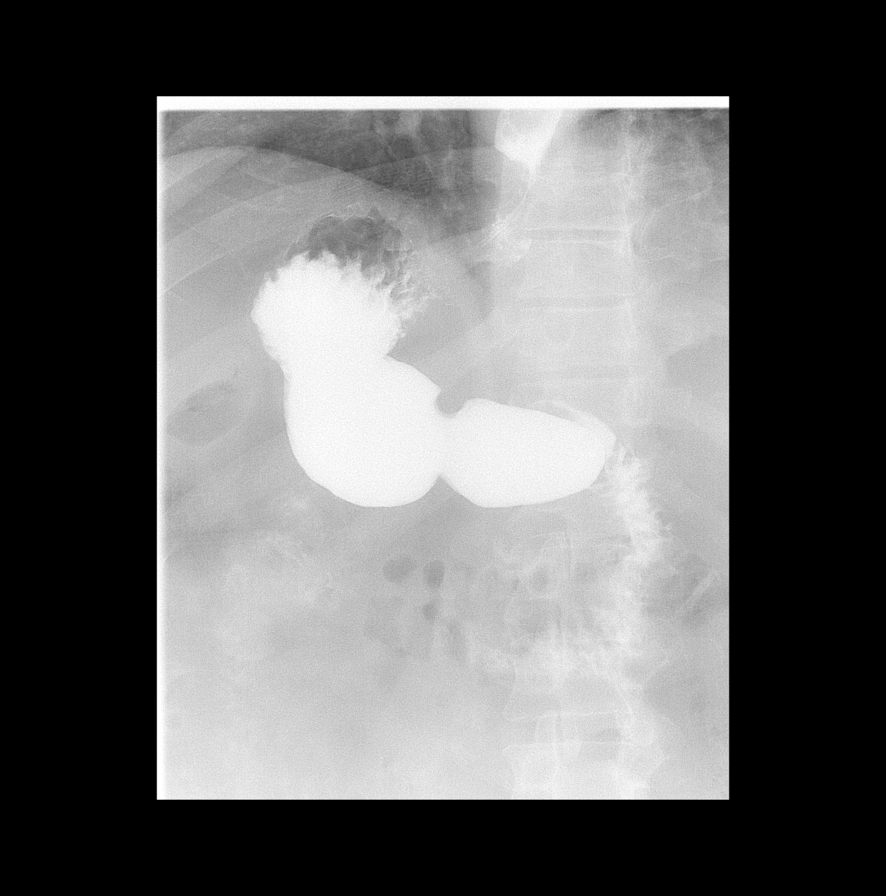

[Series 10: fluoro_barium 2fps_bw · 1 of 2 frames shown (3 of 3)]
[frame 1/2]
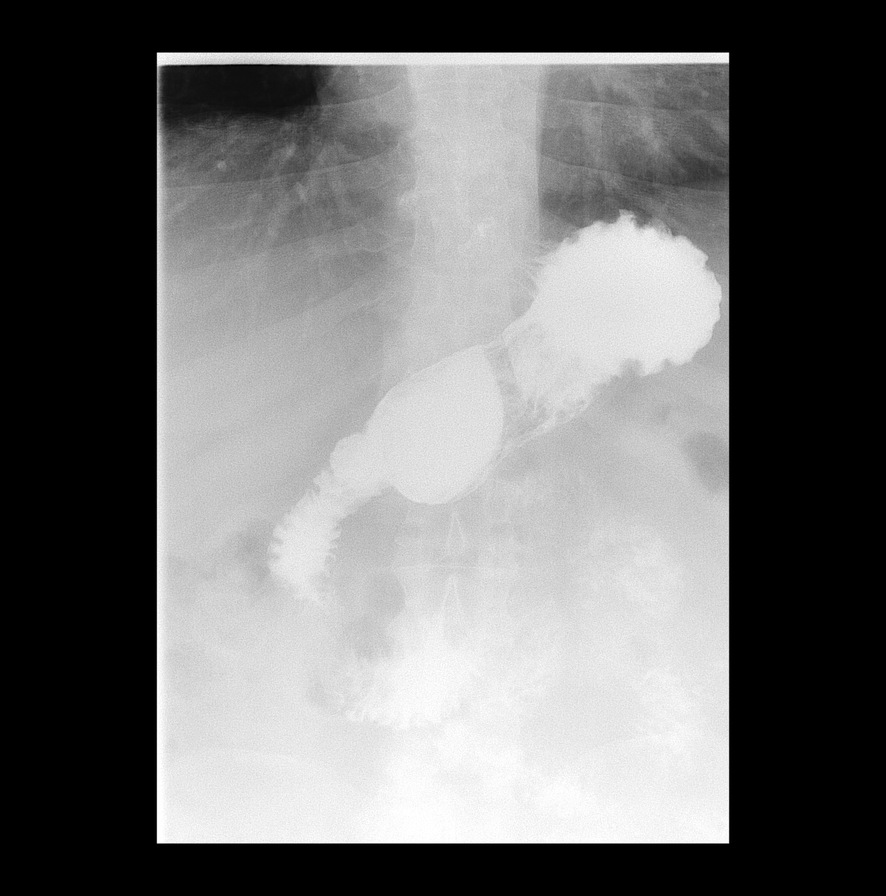

[7 of 24 positions shown; findings below may reference images not displayed]

FINDINGS: Scout Radiograph: No bowel obstruction. Pin fixation of the femoral
necks

Esophagus: Small hiatal hernia. No mucosal irregularity. No
obstruction or mass. Anterior posterior cervical fusion noted

Esophageal motility: Within normal limits.

Gastroesophageal reflux: Mild gastroesophageal reflux.

Ingested 13mm barium tablet: Passed freely .

Stomach: Small hiatal hernia.  Normal orientation of the stomach.

Gastric emptying: Normal.

Duodenum: Normal appearance.

Other:  None.
IMPRESSION: 1. Normal anatomy of the esophagus, stomach, duodenum.
2. Small hiatal hernia.
3. Mild gastroesophageal reflux.

## 2022-11-05 ENCOUNTER — Encounter: Payer: Self-pay | Admitting: Skilled Nursing Facility1

## 2022-11-05 ENCOUNTER — Encounter: Payer: 59 | Attending: Surgery | Admitting: Skilled Nursing Facility1

## 2022-11-05 VITALS — Ht 70.0 in | Wt 304.9 lb

## 2022-11-05 DIAGNOSIS — E669 Obesity, unspecified: Secondary | ICD-10-CM | POA: Diagnosis present

## 2022-11-05 DIAGNOSIS — E1165 Type 2 diabetes mellitus with hyperglycemia: Secondary | ICD-10-CM

## 2022-11-05 DIAGNOSIS — N1831 Chronic kidney disease, stage 3a: Secondary | ICD-10-CM

## 2022-11-05 NOTE — Progress Notes (Signed)
Bariatric Nutrition Follow-Up Visit Medical Nutrition Therapy   Surgery date: 06/10/2022 Surgery type: RYGB  Anthropometrics  Start weight at [facility]: 378.8 lbs (date: 01/30/2022 with prosthesis)  Height: 70 in Weight: 304.9 pounds (with prosthesis)    Clinical  Sx: RYGB Medical hx: DM, CKD stage 3, sleep apnea, CGM Medications: farxiga, lowered blood pressure medicines, no longer taking insulin  Labs:  03/13/2022  Potassium 5.9; Na 140 (WNL); GFR 26; Creatinine 2.93; BUN 43; Glucose 93; Ca 9.1; Phosphorus 4.3; Hemoglobin 11.4 Notable signs/symptoms: possibly needs another fitting for his prosthesis Any previous deficiencies? No    Lifestyle & Dietary Hx   Pt states he still uses protein shakes and feels he is probably under consuming stating he has not had any pain it is mostly due to . Pt states he does still struggle with tolerating chicken. Pt states he can eat fish now and shrimp.  Pt states he gets low blood sugars throughout the week stating it is due to being more active and not eating enough.  Pt states defines lows as below 38: dietitian educated pt on hypoglycemia and correcting anything below 70.  Pt states he is having trouble keeping food in the house due to his children eating it.  Pt states he does have trouble with how he used to eat and preconceived notions of a healthy diet and reconciling the facts.   Estimated daily fluid intake: 80 oz Estimated daily protein intake: 80 g Supplements: multi and calcium (advised to clear this with his nephrologist) Current average weekly physical activity: ADL's due to back pain but trying a few minute walks    24-Hr Dietary Recall First Meal: protein shake or starbucks frappe Snack:   Second Meal: hamburger patty or frozen meal or protein shake  Snack:   Third Meal: beans or chicken salad Snack:  Beverages: water, perfect lemon drink, cold coffee + sugar free syrup, flavored water  Post-Op Goals/ Signs/ Symptoms Using  straws: no Drinking while eating: no Chewing/swallowing difficulties: no Changes in vision: no Changes to mood/headaches: no Hair loss/changes to skin/nails: no Difficulty focusing/concentrating: no Sweating: no Limb weakness: no Dizziness/lightheadedness: no Palpitations: no  Carbonated/caffeinated beverages: no N/V/D/C/Gas: nausea Abdominal pain: no Dumping syndrome: no    NUTRITION DIAGNOSIS  Overweight/obesity (-3.3) related to past poor dietary habits and physical inactivity as evidenced by completed bariatric surgery and following dietary guidelines for continued weight loss and healthy nutrition status.     NUTRITION INTERVENTION Nutrition counseling (C-1) and education (E-2) to facilitate bariatric surgery goals, including: The importance of consuming adequate calories as well as certain nutrients daily due to the body's need for essential vitamins, minerals, and fats The importance of daily physical activity and to reach a goal of at least 150 minutes of moderate to vigorous physical activity weekly (or as directed by their physician) due to benefits such as increased musculature and improved lab values The importance of intuitive eating specifically learning hunger-satiety cues and understanding the importance of learning a new body: The importance of mindful eating to avoid grazing behaviors  Encouraged patient to honor their body's internal hunger and fullness cues.  Throughout the day, check in mentally and rate hunger. Stop eating when satisfied not full regardless of how much food is left on the plate.  Get more if still hungry 20-30 minutes later.  The key is to honor satisfaction so throughout the meal, rate fullness factor and stop when comfortably satisfied not physically full. The key is to honor hunger  and fullness without any feelings of guilt or shame.  Pay attention to what the internal cues are, rather than any external factors. This will enhance the confidence  you have in listening to your own body and following those internal cues enabling you to increase how often you eat when you are hungry not out of appetite and stop when you are satisfied not full.  Encouraged pt to continue to eat balanced meals inclusive of non starchy vegetables 2 times a day 7 days a week Encouraged pt to choose lean protein sources: limiting beef, pork, sausage, hotdogs, and lunch meat Encourage pt to choose healthy fats such as plant based limiting animal fats Encouraged pt to continue to drink a minium 64 fluid ounces with half being plain water to satisfy proper hydration  Hypoglycemia prevention and correction Complex carbohydrates verses simple carbohydrates   Handouts Re-Provided Include  Detailed Meal ideas sheet  Learning Style & Readiness for Change Teaching method utilized: Visual & Auditory  Demonstrated degree of understanding via: Teach Back  Readiness Level: action Barriers to learning/adherence to lifestyle change: stressful   RD's Notes for Next Visit Assess adherence to pt chosen goals    MONITORING & EVALUATION Dietary intake, weekly physical activity, body weight  Next Steps Patient is to follow-up in 3 months

## 2022-12-27 ENCOUNTER — Ambulatory Visit (INDEPENDENT_AMBULATORY_CARE_PROVIDER_SITE_OTHER): Payer: 59 | Admitting: Pulmonary Disease

## 2022-12-27 ENCOUNTER — Encounter: Payer: Self-pay | Admitting: Pulmonary Disease

## 2022-12-27 VITALS — BP 152/98 | HR 90 | Ht 70.0 in | Wt 302.0 lb

## 2022-12-27 DIAGNOSIS — G4733 Obstructive sleep apnea (adult) (pediatric): Secondary | ICD-10-CM
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
## 2022-12-27 NOTE — Patient Instructions (Signed)
I will see you back in about 6 months  Continue weight loss efforts  Regular exercises  Continue aggressive measures to manage your risk profile for kidney issues  Focus on your blood pressure control

## 2023-02-04 ENCOUNTER — Encounter: Payer: 59 | Attending: Internal Medicine | Admitting: Skilled Nursing Facility1

## 2023-02-04 ENCOUNTER — Encounter: Payer: Self-pay | Admitting: Skilled Nursing Facility1

## 2023-02-04 VITALS — Ht 70.0 in | Wt 299.3 lb

## 2023-02-04 DIAGNOSIS — E119 Type 2 diabetes mellitus without complications: Secondary | ICD-10-CM | POA: Diagnosis not present

## 2023-02-04 DIAGNOSIS — G4730 Sleep apnea, unspecified: Secondary | ICD-10-CM | POA: Diagnosis not present

## 2023-02-04 DIAGNOSIS — Z713 Dietary counseling and surveillance: Secondary | ICD-10-CM | POA: Insufficient documentation

## 2023-02-04 DIAGNOSIS — N189 Chronic kidney disease, unspecified: Secondary | ICD-10-CM

## 2023-02-04 DIAGNOSIS — N183 Chronic kidney disease, stage 3 unspecified: Secondary | ICD-10-CM | POA: Insufficient documentation

## 2023-02-04 DIAGNOSIS — E669 Obesity, unspecified: Secondary | ICD-10-CM | POA: Diagnosis present

## 2023-02-04 DIAGNOSIS — E0840 Diabetes mellitus due to underlying condition with diabetic neuropathy, unspecified: Secondary | ICD-10-CM

## 2023-02-04 NOTE — Progress Notes (Signed)
Bariatric Nutrition Follow-Up Visit Medical Nutrition Therapy   Surgery date: 06/10/2022 Surgery type: RYGB  Anthropometrics  Start weight at [facility]: 378.8 lbs (date: 01/30/2022 with prosthesis)  Height: 70 in Weight: 299.3 pounds (with prosthesis)    Clinical  Sx: RYGB Medical hx: DM, CKD stage 3, sleep apnea, CGM Medications: farxiga changed to jardiance, lowered blood pressure medicines, no longer taking insulin  Labs:  03/13/2022  Potassium 5.9; Na 140 (WNL); GFR 26; Creatinine 2.93; BUN 43; Glucose 93; Ca 9.1; Phosphorus 4.3; Hemoglobin 11.4 Notable signs/symptoms: possibly needs another fitting for his prosthesis Any previous deficiencies? No    Lifestyle & Dietary Hx   Pt states he is still having lows: 60's in the morning before he eats anything: this morning 59 stating 11% lows on average with 6 in the last 7 days alone; 16% lows in the last 30 days.  Over 250 with hot dog in a cressoint and then dropped to below 70  Estimated daily fluid intake: 80 oz Estimated daily protein intake: 80 g Supplements: multi and calcium (advised to clear this with his nephrologist) Current average weekly physical activity: ADL's due to back pain but trying a few minute walks    24-Hr Dietary Recall First Meal: protein shake or starbucks frappe or overnight oats  Snack:   Second Meal: hamburger patty or frozen meal or protein shake  Snack:   Third Meal 8:30-9pm: wrap: lettuce + chicken + olives + banana pepper + onion Snack:  Beverages: water, perfect lemon drink, cold coffee + sugar free syrup, flavored water, ICE  Post-Op Goals/ Signs/ Symptoms Using straws: no Drinking while eating: no Chewing/swallowing difficulties: no Changes in vision: no Changes to mood/headaches: no Hair loss/changes to skin/nails: no Difficulty focusing/concentrating: no Sweating: no Limb weakness: no Dizziness/lightheadedness: no Palpitations: no  Carbonated/caffeinated beverages: no N/V/D/C/Gas:  nausea Abdominal pain: no Dumping syndrome: no    NUTRITION DIAGNOSIS  Overweight/obesity ([place]-3.3) related to past poor dietary habits and physical inactivity as evidenced by completed bariatric surgery and following dietary guidelines for continued weight loss and healthy nutrition status.     NUTRITION INTERVENTION Nutrition counseling (C-1) and education (E-2) to facilitate bariatric surgery goals, including: The importance of consuming adequate calories as well as certain nutrients daily due to the body's need for essential vitamins, minerals, and fats The importance of daily physical activity and to reach a goal of at least 150 minutes of moderate to vigorous physical activity weekly (or as directed by their physician) due to benefits such as increased musculature and improved lab values The importance of intuitive eating specifically learning hunger-satiety cues and understanding the importance of learning a new body: The importance of mindful eating to avoid grazing behaviors  Encouraged patient to honor their body's internal hunger and fullness cues.  Throughout the day, check in mentally and rate hunger. Stop eating when satisfied not full regardless of how much food is left on the plate.  Get more if still hungry 20-30 minutes later.  The key is to honor satisfaction so throughout the meal, rate fullness factor and stop when comfortably satisfied not physically full. The key is to honor hunger and fullness without any feelings of guilt or shame.  Pay attention to what the internal cues are, rather than any external factors. This will enhance the confidence you have in listening to your own body and following those internal cues enabling you to increase how often you eat when you are hungry not out of appetite and stop when  you are satisfied not full.  Encouraged pt to continue to eat balanced meals inclusive of non starchy vegetables 2 times a day 7 days a week Encouraged pt to choose lean  protein sources: limiting beef, pork, sausage, hotdogs, and lunch meat Encourage pt to choose healthy fats such as plant based limiting animal fats Encouraged pt to continue to drink a minium 64 fluid ounces with half being plain water to satisfy proper hydration  Hypoglycemia prevention and correction Complex carbohydrates verses simple carbohydrates   Hypoglycemia Education:   Recognition: Trained pt to recognize the signs and symptoms of hypoglycemia, which can include shakiness, sweating, dizziness, confusion, irritability, weakness, hunger, and rapid heartbeat.  Monitoring: Regular blood glucose monitoring to detect low blood sugar levels early. This may involve using a glucose meter or continuous glucose monitoring system.  Treatment Thresholds: Establishing specific blood glucose thresholds at which action should be taken to treat hypoglycemia. These thresholds may vary depending on factors such as age, individual health status, and type of diabetes. Below 70 is considered low and warrants a need to correct.   Treatment Options: Provided guidance on appropriate treatment options for hypoglycemia. Common treatments include consuming fast-acting carbohydrates such as glucose tablets, fruit juice, or glucose gel. In some cases, glucagon injection kits may be used for severe hypoglycemia when the individual is unable to consume carbohydrates orally; resting for 15 minutes and then rechecking, once blood sugar is back to the normal range eating a snack or meal with protein Follow-Up: Ensuring appropriate follow-up care after a hypoglycemic episode, including monitoring for recurrent episodes  Education: Offering education and support to individuals with hypoglycemia and their caregivers on how to prevent and manage hypoglycemia, including the importance of balanced meals, regular exercise, medication adherence, and the role of blood glucose monitoring. Emergency Procedures: Providing clear  instructions on when to seek emergency medical assistance for severe hypoglycemia that does not respond to initial treatment or for individuals who are unconscious or unable to swallow. Prevention is important as hypoglycemia can be harmful with such examples as loss of consciousness and cardiovascular injury.   Goals: Eat a larger meal at dinner one day check morning blood sugar Then if that does not work try a snack around 11pm: start with a protein then the next day a carb then the next day a carb + protein  Start logging everything you eat  Ask your doctor for a referral to the endocrinologist   Handouts previously Provided Include  Detailed Meal ideas sheet  Learning Style & Readiness for Change Teaching method utilized: Visual & Auditory  Demonstrated degree of understanding via: Teach Back  Readiness Level: action Barriers to learning/adherence to lifestyle change: stressful   RD's Notes for Next Visit Assess adherence to pt chosen goals    MONITORING & EVALUATION Dietary intake, weekly physical activity, body weight  Next Steps Patient is to follow-up in 3-4 weeks

## 2023-03-25 ENCOUNTER — Encounter: Payer: Self-pay | Admitting: Skilled Nursing Facility1

## 2023-03-25 ENCOUNTER — Encounter: Payer: 59 | Attending: Internal Medicine | Admitting: Skilled Nursing Facility1

## 2023-03-25 VITALS — Ht 70.0 in | Wt 294.6 lb

## 2023-03-25 DIAGNOSIS — Z713 Dietary counseling and surveillance: Secondary | ICD-10-CM | POA: Diagnosis not present

## 2023-03-25 DIAGNOSIS — E0840 Diabetes mellitus due to underlying condition with diabetic neuropathy, unspecified: Secondary | ICD-10-CM | POA: Diagnosis present

## 2023-03-25 DIAGNOSIS — E1140 Type 2 diabetes mellitus with diabetic neuropathy, unspecified: Secondary | ICD-10-CM | POA: Diagnosis not present

## 2023-03-25 NOTE — Progress Notes (Signed)
Bariatric Nutrition Follow-Up Visit Medical Nutrition Therapy   Surgery date: 06/10/2022 Surgery type: RYGB  Anthropometrics  Start weight at [facility]: 378.8 lbs (date: 01/30/2022 with prosthesis)  Height: 70 in Weight: 294.7 pounds (with prosthesis)    Clinical  Sx: RYGB Medical hx: DM, CKD stage 3, sleep apnea, CGM Medications: farxiga changed to jardiance, lowered blood pressure medicines, no longer taking insulin  Labs:  03/13/2022  Potassium 5.9; Na 140 (WNL); GFR 26; Creatinine 2.93; BUN 43; Glucose 93; Ca 9.1; Phosphorus 4.3; Hemoglobin 11.4 Notable signs/symptoms: possibly needs another fitting for his prosthesis Any previous deficiencies? No    Lifestyle & Dietary Hx   Pt states he is still having lows: 19% up from 16%  lows in the last 30 days; according to CGM lows occurring most often around 3am-6am.  Tried bigger meal with no snack no changes then tried big meal and then snack at 12am  Pt sates it is hard to eat throughout the day sometimes.   Over 250 with hot dog in a cressoint and then dropped to below 70  Sunday: 11:30-12pm soup: chicken gnocci and salad and 1 bread stick low that night 4am below 50   5pm: Tuna melt with 2 slices marble rye bread with lettuce + hot tea and 2 T honey  8 or 9pm: leftover pasta + apple  11-11:30: Snack: triscuit and tea with honey   Estimated daily fluid intake: 80 oz Estimated daily protein intake: 80 g Supplements: multi and calcium (advised to clear this with his nephrologist) Current average weekly physical activity: ADL's due to back pain but trying a few minute walks    24-Hr Dietary Recall First Meal: 2 whole grain slice sand eggs + meat and cheese  Snack:   Second Meal: hamburger patty or frozen meal or protein shake  Snack:   Third Meal 8:30-9pm: wrap: lettuce + chicken + olives + banana pepper + onion Snack:  Beverages: water, perfect lemon drink, cold coffee + sugar free syrup, flavored water, ICE  Post-Op  Goals/ Signs/ Symptoms Using straws: no Drinking while eating: no Chewing/swallowing difficulties: no Changes in vision: no Changes to mood/headaches: no Hair loss/changes to skin/nails: no Difficulty focusing/concentrating: no Sweating: no Limb weakness: no Dizziness/lightheadedness: no Palpitations: no  Carbonated/caffeinated beverages: no N/V/D/C/Gas: nausea Abdominal pain: no Dumping syndrome: no    NUTRITION DIAGNOSIS  Overweight/obesity (Ryder-3.3) related to past poor dietary habits and physical inactivity as evidenced by completed bariatric surgery and following dietary guidelines for continued weight loss and healthy nutrition status.     NUTRITION INTERVENTION Nutrition counseling (C-1) and education (E-2) to facilitate bariatric surgery goals, including: The importance of consuming adequate calories as well as certain nutrients daily due to the body's need for essential vitamins, minerals, and fats The importance of daily physical activity and to reach a goal of at least 150 minutes of moderate to vigorous physical activity weekly (or as directed by their physician) due to benefits such as increased musculature and improved lab values The importance of intuitive eating specifically learning hunger-satiety cues and understanding the importance of learning a new body: The importance of mindful eating to avoid grazing behaviors  Encouraged patient to honor their body's internal hunger and fullness cues.  Throughout the day, check in mentally and rate hunger. Stop eating when satisfied not full regardless of how much food is left on the plate.  Get more if still hungry 20-30 minutes later.  The key is to honor satisfaction so throughout the meal,  rate fullness factor and stop when comfortably satisfied not physically full. The key is to honor hunger and fullness without any feelings of guilt or shame.  Pay attention to what the internal cues are, rather than any external factors. This  will enhance the confidence you have in listening to your own body and following those internal cues enabling you to increase how often you eat when you are hungry not out of appetite and stop when you are satisfied not full.  Encouraged pt to continue to eat balanced meals inclusive of non starchy vegetables 2 times a day 7 days a week Encouraged pt to choose lean protein sources: limiting beef, pork, sausage, hotdogs, and lunch meat Encourage pt to choose healthy fats such as plant based limiting animal fats Encouraged pt to continue to drink a minium 64 fluid ounces with half being plain water to satisfy proper hydration  Hypoglycemia prevention and correction Complex carbohydrates verses simple carbohydrates   Hypoglycemia Education:   Recognition: Trained pt to recognize the signs and symptoms of hypoglycemia, which can include shakiness, sweating, dizziness, confusion, irritability, weakness, hunger, and rapid heartbeat.  Monitoring: Regular blood glucose monitoring to detect low blood sugar levels early. This may involve using a glucose meter or continuous glucose monitoring system.  Treatment Thresholds: Establishing specific blood glucose thresholds at which action should be taken to treat hypoglycemia. These thresholds may vary depending on factors such as age, individual health status, and type of diabetes. Below 70 is considered low and warrants a need to correct.   Treatment Options: Provided guidance on appropriate treatment options for hypoglycemia. Common treatments include consuming fast-acting carbohydrates such as glucose tablets, fruit juice, or glucose gel. In some cases, glucagon injection kits may be used for severe hypoglycemia when the individual is unable to consume carbohydrates orally; resting for 15 minutes and then rechecking, once blood sugar is back to the normal range eating a snack or meal with protein Follow-Up: Ensuring appropriate follow-up care after a  hypoglycemic episode, including monitoring for recurrent episodes  Education: Offering education and support to individuals with hypoglycemia and their caregivers on how to prevent and manage hypoglycemia, including the importance of balanced meals, regular exercise, medication adherence, and the role of blood glucose monitoring. Emergency Procedures: Providing clear instructions on when to seek emergency medical assistance for severe hypoglycemia that does not respond to initial treatment or for individuals who are unconscious or unable to swallow. Prevention is important as hypoglycemia can be harmful with such examples as loss of consciousness and cardiovascular injury.   Goals: Log everything you eat and drink  Only choose complex whole grain carbs  Experiment with metamucil in the morning with 2 slices of toast Change your bread to a higher fiber bread: aim for 2-3 grams of fiber per slice Do not eat white pasta, white bread, white rice, or sweets  Try some extend products  If having trscuits have with cheese  Handouts previously Provided Include  Detailed Meal ideas sheet  Learning Style & Readiness for Change Teaching method utilized: Visual & Auditory  Demonstrated degree of understanding via: Teach Back  Readiness Level: action Barriers to learning/adherence to lifestyle change: stressed   RD's Notes for Next Visit Assess adherence to pt chosen goals    MONITORING & EVALUATION Dietary intake, weekly physical activity, body weight  Next Steps Patient is to follow-up in 4 weeks

## 2023-04-24 ENCOUNTER — Ambulatory Visit: Payer: 59 | Admitting: Podiatry

## 2023-05-07 ENCOUNTER — Other Ambulatory Visit (INDEPENDENT_AMBULATORY_CARE_PROVIDER_SITE_OTHER): Payer: 59

## 2023-05-07 ENCOUNTER — Other Ambulatory Visit: Payer: Self-pay

## 2023-05-07 DIAGNOSIS — E1165 Type 2 diabetes mellitus with hyperglycemia: Secondary | ICD-10-CM

## 2023-05-07 LAB — LIPID PANEL
Cholesterol: 154 mg/dL (ref 0–200)
HDL: 29.6 mg/dL — ABNORMAL LOW (ref >39.00)
LDL Cholesterol: 106 mg/dL — ABNORMAL HIGH (ref 0–99)
NonHDL: 124.71
Total CHOL/HDL Ratio: 5
Triglycerides: 93 mg/dL (ref 0.0–149.0)
VLDL: 18.6 mg/dL (ref 0.0–40.0)

## 2023-05-07 LAB — HEMOGLOBIN A1C: Hgb A1c MFr Bld: 5.6 % (ref 4.6–6.5)

## 2023-05-07 LAB — MICROALBUMIN / CREATININE URINE RATIO
Creatinine,U: 165 mg/dL
Microalb Creat Ratio: 183.5 mg/g — ABNORMAL HIGH (ref 0.0–30.0)
Microalb, Ur: 302.8 mg/dL — ABNORMAL HIGH (ref 0.0–1.9)

## 2023-05-13 ENCOUNTER — Encounter: Payer: Self-pay | Admitting: "Endocrinology

## 2023-05-13 ENCOUNTER — Encounter: Payer: Self-pay | Admitting: Skilled Nursing Facility1

## 2023-05-13 ENCOUNTER — Ambulatory Visit (INDEPENDENT_AMBULATORY_CARE_PROVIDER_SITE_OTHER): Payer: 59 | Admitting: "Endocrinology

## 2023-05-13 ENCOUNTER — Encounter: Payer: 59 | Attending: Internal Medicine | Admitting: Skilled Nursing Facility1

## 2023-05-13 VITALS — Wt 274.0 lb

## 2023-05-13 VITALS — BP 110/60 | HR 86 | Ht 70.0 in | Wt 276.8 lb

## 2023-05-13 DIAGNOSIS — Z7984 Long term (current) use of oral hypoglycemic drugs: Secondary | ICD-10-CM | POA: Diagnosis not present

## 2023-05-13 DIAGNOSIS — E11649 Type 2 diabetes mellitus with hypoglycemia without coma: Secondary | ICD-10-CM

## 2023-05-13 DIAGNOSIS — Z6841 Body Mass Index (BMI) 40.0 and over, adult: Secondary | ICD-10-CM | POA: Diagnosis not present

## 2023-05-13 DIAGNOSIS — E7800 Pure hypercholesterolemia, unspecified: Secondary | ICD-10-CM

## 2023-05-13 DIAGNOSIS — Z713 Dietary counseling and surveillance: Secondary | ICD-10-CM | POA: Diagnosis not present

## 2023-05-13 DIAGNOSIS — E669 Obesity, unspecified: Secondary | ICD-10-CM | POA: Diagnosis present

## 2023-05-13 DIAGNOSIS — E0840 Diabetes mellitus due to underlying condition with diabetic neuropathy, unspecified: Secondary | ICD-10-CM | POA: Insufficient documentation

## 2023-05-13 LAB — COMPREHENSIVE METABOLIC PANEL
ALT: 15 U/L (ref 0–53)
AST: 18 U/L (ref 0–37)
Albumin: 3.7 g/dL (ref 3.5–5.2)
Alkaline Phosphatase: 136 U/L — ABNORMAL HIGH (ref 39–117)
BUN: 22 mg/dL (ref 6–23)
CO2: 24 mEq/L (ref 19–32)
Calcium: 9 mg/dL (ref 8.4–10.5)
Chloride: 107 mEq/L (ref 96–112)
Creatinine, Ser: 3.17 mg/dL — ABNORMAL HIGH (ref 0.40–1.50)
GFR: 22.78 mL/min — ABNORMAL LOW (ref >60.00)
Glucose, Bld: 113 mg/dL — ABNORMAL HIGH (ref 70–99)
Potassium: 4.8 mEq/L (ref 3.5–5.1)
Sodium: 137 mEq/L (ref 135–145)
Total Bilirubin: 0.5 mg/dL (ref 0.2–1.2)
Total Protein: 7 g/dL (ref 6.0–8.3)

## 2023-05-13 MED ORDER — GVOKE HYPOPEN 1-PACK 1 MG/0.2ML ~~LOC~~ SOAJ: 1.0000 mg | SUBCUTANEOUS | 2 refills | Status: AC | PRN

## 2023-05-13 NOTE — Progress Notes (Signed)
Bariatric Nutrition Follow-Up Visit Medical Nutrition Therapy   Surgery date: 06/10/2022 Surgery type: RYGB  Anthropometrics  Start weight at [facility]: 378.8 lbs (date: 01/30/2022 with prosthesis)  Height: 70 in Weight: 274 pounds (with prosthesis)    Clinical  Sx: RYGB Medical hx: DM, CKD stage 3, sleep apnea, CGM Medications: jardiance, lowered blood pressure medicines Labs:  HDL 29.60, LDL 106, A1C 5.6 Notable signs/symptoms: possibly needs another fitting for his prosthesis Any previous deficiencies? No    Lifestyle & Dietary Hx   Pt states he is still having lows: 19% up from 16% now currently up to 32% lows in the last 30 days; according to CGM lows occurring most often around 3am-6am. Pt has lost 20 pounds since May and had a microalbumin lab of 302.8.  Dietitian stressed to the pt the extreme importance of doing what he can behaviorally to stop these los from happening so severely and so often.   Pt states he wakes around 6-7am.  In office pts blood sugar climbed up to 200 within the next 15-20 minutes dropped down to 93 on a trajectory for a low having eaten blueberries and granola: advised pt to get home and eat chicken salad and matza cracker before it drops below 70   Estimated daily fluid intake: 80 oz Estimated daily protein intake: 80 g Supplements: multi and calcium (advised to clear this with his nephrologist) Current average weekly physical activity: ADL's due to back pain but trying a few minute walks    24-Hr Dietary Recall First Meal: 2 whole grain slice sand eggs + meat and cheese  Snack:   Second Meal: hamburger patty or frozen meal or protein shake  Snack:   Third Meal 8:30-9pm: wrap: lettuce + chicken + olives + banana pepper + onion Snack:  Beverages: water, perfect lemon drink, cold coffee + sugar free syrup, flavored water, ICE  Post-Op Goals/ Signs/ Symptoms Using straws: no Drinking while eating: no Chewing/swallowing difficulties: no Changes  in vision: no Changes to mood/headaches: no Hair loss/changes to skin/nails: no Difficulty focusing/concentrating: no Sweating: no Limb weakness: no Dizziness/lightheadedness: no Palpitations: no  Carbonated/caffeinated beverages: no N/V/D/C/Gas: nausea Abdominal pain: no Dumping syndrome: no    NUTRITION DIAGNOSIS  Overweight/obesity (Young-3.3) related to past poor dietary habits and physical inactivity as evidenced by completed bariatric surgery and following dietary guidelines for continued weight loss and healthy nutrition status.     NUTRITION INTERVENTION Nutrition counseling (C-1) and education (E-2) to facilitate bariatric surgery goals, including: The importance of consuming adequate calories as well as certain nutrients daily due to the body's need for essential vitamins, minerals, and fats The importance of daily physical activity and to reach a goal of at least 150 minutes of moderate to vigorous physical activity weekly (or as directed by their physician) due to benefits such as increased musculature and improved lab values The importance of intuitive eating specifically learning hunger-satiety cues and understanding the importance of learning a new body: The importance of mindful eating to avoid grazing behaviors  Encouraged patient to honor their body's internal hunger and fullness cues.  Throughout the day, check in mentally and rate hunger. Stop eating when satisfied not full regardless of how much food is left on the plate.  Get more if still hungry 20-30 minutes later.  The key is to honor satisfaction so throughout the meal, rate fullness factor and stop when comfortably satisfied not physically full. The key is to honor hunger and fullness without any feelings of guilt  or shame.  Pay attention to what the internal cues are, rather than any external factors. This will enhance the confidence you have in listening to your own body and following those internal cues enabling you  to increase how often you eat when you are hungry not out of appetite and stop when you are satisfied not full.  Encouraged pt to continue to eat balanced meals inclusive of non starchy vegetables 2 times a day 7 days a week Encouraged pt to choose lean protein sources: limiting beef, pork, sausage, hotdogs, and lunch meat Encourage pt to choose healthy fats such as plant based limiting animal fats Encouraged pt to continue to drink a minium 64 fluid ounces with half being plain water to satisfy proper hydration  Hypoglycemia prevention and correction Complex carbohydrates verses simple carbohydrates   Hypoglycemia Education:   Recognition: Trained pt to recognize the signs and symptoms of hypoglycemia, which can include shakiness, sweating, dizziness, confusion, irritability, weakness, hunger, and rapid heartbeat.  Monitoring: Regular blood glucose monitoring to detect low blood sugar levels early. This may involve using a glucose meter or continuous glucose monitoring system.  Treatment Thresholds: Establishing specific blood glucose thresholds at which action should be taken to treat hypoglycemia. These thresholds may vary depending on factors such as age, individual health status, and type of diabetes. Below 70 is considered low and warrants a need to correct.   Treatment Options: Provided guidance on appropriate treatment options for hypoglycemia. Common treatments include consuming fast-acting carbohydrates such as glucose tablets, fruit juice, or glucose gel. In some cases, glucagon injection kits may be used for severe hypoglycemia when the individual is unable to consume carbohydrates orally; resting for 15 minutes and then rechecking, once blood sugar is back to the normal range eating a snack or meal with protein Follow-Up: Ensuring appropriate follow-up care after a hypoglycemic episode, including monitoring for recurrent episodes  Education: Offering education and support to  individuals with hypoglycemia and their caregivers on how to prevent and manage hypoglycemia, including the importance of balanced meals, regular exercise, medication adherence, and the role of blood glucose monitoring. Emergency Procedures: Providing clear instructions on when to seek emergency medical assistance for severe hypoglycemia that does not respond to initial treatment or for individuals who are unconscious or unable to swallow. Prevention is important as hypoglycemia can be harmful with such examples as loss of consciousness and cardiovascular injury.   Goals: DO NOT SKIP MEALS: meals and snacks every 3 hours starting at 8am ONLY complex carbs like: trsicuit, matza cracker, beans, lentils, Quinoa, etc. Have a 2-1 protein to carb ratio at each meal for the next week Absolutely no white starch, candy, energy drinks, granola  Do not drink any fluid within 30 minutes of eating When you pick up your glucagon have the pharmacist teach you how to use it and then teach those in your family how and when to administer it for you   Learning Style & Readiness for Change Teaching method utilized: Visual & Auditory  Demonstrated degree of understanding via: Teach Back  Readiness Level: contemplative Barriers to learning/adherence to lifestyle change: stressed   RD's Notes for Next Visit Assess adherence to pt chosen goals    MONITORING & EVALUATION Dietary intake, weekly physical activity, body weight  Next Steps Patient is to follow-up in 1 week

## 2023-05-13 NOTE — Patient Instructions (Signed)
10-Point Nutrition Plan for Preventing Hypoglycemia in Post-Bariatric Hypoglycemia. Control portions of carbohydrate - 30 grams/meal, 15 grams/snack. Choose low-glycemic carbohydrates. Avoid high-glycemic carbohydrates. Include (heart-healthy) fats in each meal or snack - 15 grams/meal, 5 grams/snack. Emphasize optimal protein intake. Space meals/snacks 3-4 hours apart. Avoid consuming liquids with meals. Avoid alcohol. Avoid caffeine. Maintain post-bariatric vitamin and mineral intake.    Low Glycemic Index Carbohydrates (CHOOSE) Steel-cut oats (regular, not quick-cook or instant) Oat bran cereal Beans/legumes (e.g., garbanzo, navy, kidney, lima, pinto, black-eyed and pea beans, edamame (soybeans), lentils Bean products (e.g., hummus, tofu) Pearled barley, cooked al dente Yams Some fruits (e.g., grapefruit, apples, pears, berries, apricots, peaches) Some pasta (e.g., Barilla Plus pasta), cooked al dente Some whole grain breads (e.g., Ezekiel bread, Joseph's Flax, Oat Bran & Whole Wheat Pita/Lavash/Tortillas) Some whole grain crackers (e.g., RyKrisp, RyVita, [place]) Brown rice, wild rice Quinoa  Buckwheat (a grass)   High Glycemic Index Carbohydrates (AVOID) Refined breakfast cereals (e.g., Corn Flakes, Rice Krispies, Cream of Rice, instant oatmeal) Regular pasta Most starchy vegetables (e.g., white potatoes, corn, winter (orange) squash) White rice, rice cakes Popcorn, pretzels, chips Some fruits (e.g., ripe bananas, pineapple, mango, watermelon, grapes) All fruit juices and sweetened drinks (e.g., sodas, sweetened iced tea) Bread, rolls, bagels, English muffins, and crackers made with refined flour Sweets (e.g., candy, cake, cookies, ice cream, syrup)   Heart-Healthy Fats (Adapt) Nuts, nut butters Avocado, guacamole Olives Most plant oils (e.g., olive, canola, peanut, soy, sunflower, sesame) Most seeds (e.g., sunflower, flax, sesame/sesame tahini) Oily fish (e.g., salmon,  bluefish, mackerel, tuna, sardines)   Recommended vitamin supplementation after bariatric surgery  Supplement Recommendations for the Post- Bariatric Surgery Patient  Multivitamin-multi-mineral: Begin with chewable or liquid, progress to whole tablet/capsule 200% of daily value (2 per day)  Vitamin B12: Sublingual tablets, liquid drops or mouth spray Note >1000 mg of supplemental folic acid combined with multivitamin supplements may mask B12 deficiency 1000 g/month intramuscularly Sublingual/oral tablet dose: 350-500 g/day  Calcium Citrate and Vitamin D3 Begin with chewable or liquid, progress to whole tablet/capsule Split into 500-600 mg dose Do not combine with iron containing supplements. Wait > 2 hours after taking multivitamin or iron supplement 1500-2000 mg/day Calcium Citrate 1500-2000 IU/day Vit D3 (with dose adjustments guided by laboratory assessment of vitamin D levels)  Iron Recommended for menstruating women and those at risk for anemia. Begin with chewable or liquid, progress to tablet. 18-27 mg/day elemental iron  B complex vitamins Liquid form, avoid time-released tablets Vitamin B-50 complex 1 per day   
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
## 2023-05-14 ENCOUNTER — Other Ambulatory Visit: Payer: Self-pay | Admitting: "Endocrinology

## 2023-05-14 MED ORDER — ROSUVASTATIN CALCIUM 5 MG PO TABS
5.0000 mg | ORAL_TABLET | Freq: Every day | ORAL | 1 refills | Status: DC
Start: 2023-05-14 — End: 2023-11-14

## 2023-05-21 ENCOUNTER — Ambulatory Visit: Payer: 59 | Admitting: Skilled Nursing Facility1

## 2023-05-23 ENCOUNTER — Encounter: Payer: 59 | Admitting: Skilled Nursing Facility1

## 2023-05-23 VITALS — Ht 70.0 in | Wt 276.6 lb

## 2023-05-23 DIAGNOSIS — E0840 Diabetes mellitus due to underlying condition with diabetic neuropathy, unspecified: Secondary | ICD-10-CM

## 2023-05-23 DIAGNOSIS — E669 Obesity, unspecified: Secondary | ICD-10-CM

## 2023-05-23 NOTE — Progress Notes (Signed)
Bariatric Nutrition Follow-Up Visit Medical Nutrition Therapy   Surgery date: 06/10/2022 Surgery type: RYGB  Anthropometrics  Start weight at [facility]: 378.8 lbs (date: 01/30/2022 with prosthesis)  Height: 70 in Weight: 276.6 pounds (with prosthesis)    Clinical  Sx: RYGB Medical hx: DM, CKD stage 3, sleep apnea, CGM Medications: jardiance, lowered blood pressure medicines Labs:  HDL 29.60, LDL 106, A1C 5.6 Notable signs/symptoms: possibly needs another fitting for his prosthesis Any previous deficiencies? No    Lifestyle & Dietary Hx  Pt experiences fatigue often.   Pt states he is still having lows: 19% up from 16% now currently up to 42% lows in the last 30 days; according to CGM lows occurring most often around 3am-6am. Pt has lost 20 pounds since May and had a microalbumin lab of 302.8.  Dietitian stressed to the pt the extreme importance of doing what he can behaviorally to stop these lows from happening so severely and so often.   Going to [place] for 15 days.   Using dried fruits and hard candy: try fresh fruit with the skin or cornstarch in water to correct lows and see if that stops the cycles. Also advised to get a  glucometer for finger pricks to ensure accuracy.   Estimated daily fluid intake: 80 oz Estimated daily protein intake: 80 g Supplements: multi and calcium (advised to clear this with his nephrologist) Current average weekly physical activity: ADL's due to back pain but trying a few minute walks    24-Hr Dietary Recall First Meal: 2 whole grain slice sand eggs + meat and cheese  Snack:   Second Meal: hamburger patty or frozen meal or protein shake  Snack:   Third Meal 8:30-9pm: wrap: lettuce + chicken + olives + banana pepper + onion Snack:  Beverages: water, perfect lemon drink, cold coffee + sugar free syrup, flavored water, ICE  Post-Op Goals/ Signs/ Symptoms Using straws: no Drinking while eating: no Chewing/swallowing difficulties: no Changes in  vision: no Changes to mood/headaches: no Hair loss/changes to skin/nails: no Difficulty focusing/concentrating: no Sweating: no Limb weakness: no Dizziness/lightheadedness: no Palpitations: no  Carbonated/caffeinated beverages: no N/V/D/C/Gas: nausea Abdominal pain: no Dumping syndrome: no    NUTRITION DIAGNOSIS  Overweight/obesity ([place]-3.3) related to past poor dietary habits and physical inactivity as evidenced by completed bariatric surgery and following dietary guidelines for continued weight loss and healthy nutrition status.     NUTRITION INTERVENTION Nutrition counseling (C-1) and education (E-2) to facilitate bariatric surgery goals, including: The importance of consuming adequate calories as well as certain nutrients daily due to the body's need for essential vitamins, minerals, and fats The importance of daily physical activity and to reach a goal of at least 150 minutes of moderate to vigorous physical activity weekly (or as directed by their physician) due to benefits such as increased musculature and improved lab values The importance of intuitive eating specifically learning hunger-satiety cues and understanding the importance of learning a new body: The importance of mindful eating to avoid grazing behaviors  Encouraged patient to honor their body's internal hunger and fullness cues.  Throughout the day, check in mentally and rate hunger. Stop eating when satisfied not full regardless of how much food is left on the plate.  Get more if still hungry 20-30 minutes later.  The key is to honor satisfaction so throughout the meal, rate fullness factor and stop when comfortably satisfied not physically full. The key is to honor hunger and fullness without any feelings of guilt or  shame.  Pay attention to what the internal cues are, rather than any external factors. This will enhance the confidence you have in listening to your own body and following those internal cues enabling you to  increase how often you eat when you are hungry not out of appetite and stop when you are satisfied not full.  Encouraged pt to continue to eat balanced meals inclusive of non starchy vegetables 2 times a day 7 days a week Encouraged pt to choose lean protein sources: limiting beef, pork, sausage, hotdogs, and lunch meat Encourage pt to choose healthy fats such as plant based limiting animal fats Encouraged pt to continue to drink a minium 64 fluid ounces with half being plain water to satisfy proper hydration  Hypoglycemia prevention and correction Complex carbohydrates verses simple carbohydrates   Hypoglycemia Education:   Recognition: Trained pt to recognize the signs and symptoms of hypoglycemia, which can include shakiness, sweating, dizziness, confusion, irritability, weakness, hunger, and rapid heartbeat.  Monitoring: Regular blood glucose monitoring to detect low blood sugar levels early. This may involve using a glucose meter or continuous glucose monitoring system.  Treatment Thresholds: Establishing specific blood glucose thresholds at which action should be taken to treat hypoglycemia. These thresholds may vary depending on factors such as age, individual health status, and type of diabetes. Below 70 is considered low and warrants a need to correct.   Treatment Options: Provided guidance on appropriate treatment options for hypoglycemia. Common treatments include consuming fast-acting carbohydrates such as glucose tablets, fruit juice, or glucose gel. In some cases, glucagon injection kits may be used for severe hypoglycemia when the individual is unable to consume carbohydrates orally; resting for 15 minutes and then rechecking, once blood sugar is back to the normal range eating a snack or meal with protein Follow-Up: Ensuring appropriate follow-up care after a hypoglycemic episode, including monitoring for recurrent episodes  Education: Offering education and support to individuals  with hypoglycemia and their caregivers on how to prevent and manage hypoglycemia, including the importance of balanced meals, regular exercise, medication adherence, and the role of blood glucose monitoring. Emergency Procedures: Providing clear instructions on when to seek emergency medical assistance for severe hypoglycemia that does not respond to initial treatment or for individuals who are unconscious or unable to swallow. Prevention is important as hypoglycemia can be harmful with such examples as loss of consciousness and cardiovascular injury.   Goals: DO NOT SKIP MEALS: meals and snacks every 3 hours starting at 8am ONLY complex carbs like: trsicuit, matza cracker, beans, lentils, Quinoa, etc. Have a 2-1 protein to carb ratio at each meal for the next week Absolutely no white starch, candy, energy drinks, granola  Do not drink any fluid within 30 minutes of eating When you pick up your glucagon have the pharmacist teach you how to use it and then teach those in your family how and when to administer it for you   Learning Style & Readiness for Change Teaching method utilized: Visual & Auditory  Demonstrated degree of understanding via: Teach Back  Readiness Level: contemplative Barriers to learning/adherence to lifestyle change: stressed   RD's Notes for Next Visit Assess adherence to pt chosen goals    MONITORING & EVALUATION Dietary intake, weekly physical activity, body weight  Next Steps Patient is to follow-up in 1 week

## 2023-05-29 ENCOUNTER — Ambulatory Visit (INDEPENDENT_AMBULATORY_CARE_PROVIDER_SITE_OTHER): Payer: 59 | Admitting: Podiatry

## 2023-05-29 DIAGNOSIS — M21071 Valgus deformity, not elsewhere classified, right ankle: Secondary | ICD-10-CM

## 2023-05-29 DIAGNOSIS — E0840 Diabetes mellitus due to underlying condition with diabetic neuropathy, unspecified: Secondary | ICD-10-CM | POA: Diagnosis not present

## 2023-05-29 DIAGNOSIS — E11610 Type 2 diabetes mellitus with diabetic neuropathic arthropathy: Secondary | ICD-10-CM

## 2023-05-29 NOTE — Progress Notes (Signed)
  Subjective:  Patient ID: Dwayne Rocha, male    DOB: Apr 04, 1976,  MRN: [id number]   47 y.o. male presents for routine diabetic foot exam.  Patient has a left lower extremity amputation.  He states he is doing well no issues he had diabetic liners and shoes prescribed by another surgeon.  No concerns on the right foot at this time  Objective:  Physical Exam: warm, good capillary refill nail exam onychomycosis of the toenails DP pulses palpable, PT pulses palpable, and protective sensation absent Left Foot: Below the knee amputation Right Foot: Partial right hallux amputation, valgus deformity of the rear foot  Assessment:   1. Acquired valgus deformity of right ankle   2. 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 Diabetes mellitus due to underlying condition with diabetic neuropathy, without long-term current use of insulin (HCC)      Plan:  Patient was evaluated and treated and all questions answered.  # Routine exam DM2 with neuropathy, left below the knee amputation -Discussed with patient that there is no evidence of issue on the right foot at this time. -No hyperkeratotic lesions that are concerning for preulcerative lesions -Patient has diabetic shoes and liners for the right foot which is helping -Continue to monitor for any wounds or discoloration of the foot follow-up as needed         Corinna Gab, DPM [facility]

## 2023-06-01 ENCOUNTER — Emergency Department (HOSPITAL_COMMUNITY)
Admission: EM | Admit: 2023-06-01 | Discharge: 2023-06-01 | Disposition: A | Discharge status: Home / Self Care | Payer: 59 | Attending: Emergency Medicine | Admitting: Emergency Medicine

## 2023-06-01 ENCOUNTER — Encounter (HOSPITAL_COMMUNITY): Payer: Self-pay

## 2023-06-01 ENCOUNTER — Other Ambulatory Visit: Payer: Self-pay

## 2023-06-01 DIAGNOSIS — Z794 Long term (current) use of insulin: Secondary | ICD-10-CM | POA: Diagnosis not present

## 2023-06-01 DIAGNOSIS — E875 Hyperkalemia: Secondary | ICD-10-CM | POA: Insufficient documentation

## 2023-06-01 DIAGNOSIS — N4830 Priapism, unspecified: Secondary | ICD-10-CM | POA: Diagnosis present

## 2023-06-01 DIAGNOSIS — N189 Chronic kidney disease, unspecified: Secondary | ICD-10-CM | POA: Diagnosis not present

## 2023-06-01 DIAGNOSIS — Z7901 Long term (current) use of anticoagulants: Secondary | ICD-10-CM | POA: Diagnosis not present

## 2023-06-01 LAB — CBC
HCT: 39 % (ref 39.0–52.0)
Hemoglobin: 12.5 g/dL — ABNORMAL LOW (ref 13.0–17.0)
MCH: 31.1 pg (ref 26.0–34.0)
MCHC: 32.1 g/dL (ref 30.0–36.0)
MCV: 97 fL (ref 80.0–100.0)
Platelets: 228 10*3/uL (ref 150–400)
RBC: 4.02 MIL/uL — ABNORMAL LOW (ref 4.22–5.81)
RDW: 13.3 % (ref 11.5–15.5)
WBC: 5.6 10*3/uL (ref 4.0–10.5)
nRBC: 0 % (ref 0.0–0.2)

## 2023-06-01 LAB — BASIC METABOLIC PANEL
Anion gap: 7 (ref 5–15)
BUN: 25 mg/dL — ABNORMAL HIGH (ref 6–20)
CO2: 18 mmol/L — ABNORMAL LOW (ref 22–32)
Calcium: 8.1 mg/dL — ABNORMAL LOW (ref 8.9–10.3)
Chloride: 111 mmol/L (ref 98–111)
Creatinine, Ser: 2.28 mg/dL — ABNORMAL HIGH (ref 0.61–1.24)
GFR, Estimated: 35 mL/min — ABNORMAL LOW (ref >60)
Glucose, Bld: 97 mg/dL (ref 70–99)
Potassium: 5.2 mmol/L — ABNORMAL HIGH (ref 3.5–5.1)
Sodium: 136 mmol/L (ref 135–145)

## 2023-06-01 MED ORDER — HYDRALAZINE HCL 25 MG PO TABS
100.0000 mg | ORAL_TABLET | Freq: Once | ORAL | Status: AC
  Administered 2023-06-01: 100 mg via ORAL
  Filled 2023-06-01: qty 4

## 2023-06-01 MED ORDER — MORPHINE SULFATE (PF) 2 MG/ML IV SOLN
2.0000 mg | Freq: Once | INTRAVENOUS | Status: AC
  Administered 2023-06-01: 2 mg via INTRAVENOUS
  Filled 2023-06-01: qty 1

## 2023-06-01 MED ORDER — PHENYLEPHRINE 200 MCG/ML FOR PRIAPISM / HYPOTENSION
200.0000 ug | Freq: Once | INTRAMUSCULAR | Status: AC
  Administered 2023-06-01: 200 ug via INTRACAVERNOUS
  Filled 2023-06-01: qty 50

## 2023-06-01 MED ORDER — LIDOCAINE HCL (PF) 1 % IJ SOLN
10.0000 mL | Freq: Once | INTRAMUSCULAR | Status: AC
  Administered 2023-06-01: 10 mL
  Filled 2023-06-01: qty 10

## 2023-06-01 NOTE — ED Triage Notes (Signed)
Pt said that he has had an erection for the last 6 hrs. Did not take anything to make it happen. This has been doing on for a week but it has not lasted this long.

## 2023-06-01 NOTE — ED Provider Notes (Signed)
[place] [facility] DEPARTMENT AT [facility] Provider Note   CSN: [id number] Arrival date & time: 06/01/23  2536     History  Chief Complaint  Patient presents with   Erectile Dysfunction    Dwayne Rocha is a 47 y.o. male with past medical history significant for obesity, CKD, previous AKA on his left he was found, GERD who presents with concern for priapism for the last 6 hours.  Patient reports that he did not take any erectile dysfunction medication.  He reports has been having intermittently for the last week but has not lasted this long.  He rates 7-8 out of 10 pain without movement, 9/10 pain with movement.   Erectile Dysfunction       Home Medications Prior to Admission medications   Medication Sig Start Date End Date Taking? Authorizing Provider  acetaminophen (TYLENOL) 500 MG tablet Take 1,000 mg by mouth every 6 (six) hours as needed for mild pain or moderate pain. Patient not taking: Reported on 05/13/2023    [provider]  Cholecalciferol (VITAMIN D) 50 MCG (2000 UT) tablet Take 2,000 Units by mouth daily.    [provider]  Continuous Blood Gluc Sensor (FREESTYLE LIBRE 14 DAY SENSOR) MISC Apply topically 3 (three) times daily. 04/24/20   [provider]  DULoxetine (CYMBALTA) 30 MG capsule Take 30 mg by mouth 2 (two) times daily. Patient not taking: Reported on 05/13/2023    [provider]  FARXIGA 10 MG TABS tablet Take 10 mg by mouth daily. Patient not taking: Reported on 05/13/2023 05/09/22   [provider]  Glucagon (GVOKE HYPOPEN 1-PACK) 1 MG/0.2ML SOAJ Inject 1 mg into the skin as needed (low blood sugar with imaopired consciousness). 05/13/23   Altamese [place], MD  hydrALAZINE (APRESOLINE) 100 MG tablet Take 1 tablet (100 mg total) by mouth every 8 (eight) hours. 02/27/21   Erick Blinks, MD  HYDROcodone-acetaminophen (NORCO) 10-325 MG tablet Take 1 tablet by mouth every 6 (six) hours as needed for  moderate pain.    [provider]  insulin lispro (HUMALOG KWIKPEN) 100 UNIT/ML KwikPen Inject into the skin continuous. Used with Omnipod pump, 150 units every 3 days    [provider]  Insulin Syringe-Needle U-100 (INSULIN SYRINGE 1CC/31GX5/16") 31G X 5/16" 1 ML MISC  04/27/20   [provider]  JARDIANCE 10 MG TABS tablet Take 10 mg by mouth daily. Taking 20 mg daily    [provider]  metoprolol tartrate (LOPRESSOR) 100 MG tablet Take 100 mg by mouth 2 (two) times daily.    [provider]  Olmesartan-amLODIPine-HCTZ 40-10-25 MG TABS Take 1 tablet by mouth daily.    [provider]  pantoprazole (PROTONIX) 40 MG tablet Take 40 mg by mouth 2 (two) times daily. 01/10/20   [provider]  rosuvastatin (CRESTOR) 5 MG tablet Take 1 tablet (5 mg total) by mouth daily. 05/14/23   Altamese [place], MD  rivaroxaban (XARELTO) 10 MG TABS tablet Take 1 tablet (10 mg total) by mouth daily. Patient not taking: No sig reported 02/16/21 04/19/21  Jacinta Shoe, PA-C      Allergies    Lyrica [pregabalin]    Review of Systems   Review of Systems  All other systems reviewed and are negative.   Physical Exam Updated Vital Signs BP (!) 180/93   Pulse 82   Temp 98.3 F (36.8 C) (Oral)   Resp 18   Ht 5\' 10"  (1.778 m)   Wt  126.1 kg   SpO2 99%   BMI 39.89 kg/m  Physical Exam Vitals and nursing note reviewed.  Constitutional:      General: He is not in acute distress.    Appearance: Normal appearance.  HENT:     Head: Normocephalic and atraumatic.  Eyes:     General:        Right eye: No discharge.        Left eye: No discharge.  Cardiovascular:     Rate and Rhythm: Normal rate and regular rhythm.     Heart sounds: No murmur heard.    No friction rub. No gallop.  Pulmonary:     Effort: Pulmonary effort is normal.     Breath sounds: Normal breath sounds.  Abdominal:     General: Bowel sounds are normal.     Palpations: Abdomen  is soft.  Genitourinary:    Comments: Swollen and tender corpus cavernosum, with normal appearance of glans, scrotum Skin:    General: Skin is warm and dry.     Capillary Refill: Capillary refill takes less than 2 seconds.  Neurological:     Mental Status: He is alert and oriented to person, place, and time.  Psychiatric:        Mood and Affect: Mood normal.        Behavior: Behavior normal.     ED Results / Procedures / Treatments   Labs (all labs ordered are listed, but only abnormal results are displayed) Labs Reviewed  CBC - Abnormal; Notable for the following components:      Result Value   RBC 4.02 (*)    Hemoglobin 12.5 (*)    All other components within normal limits  BASIC METABOLIC PANEL - Abnormal; Notable for the following components:   Potassium 5.2 (*)    CO2 18 (*)    BUN 25 (*)    Creatinine, Ser 2.28 (*)    Calcium 8.1 (*)    GFR, Estimated 35 (*)    All other components within normal limits    EKG None  Radiology No results found.  Procedures .Nerve Block  Date/Time: 06/01/2023 11:06 AM  Performed by: Olene Floss, PA-C Authorized by: Olene Floss, PA-C   Consent:    Consent obtained:  Verbal   Consent given by:  Patient   Risks, benefits, and alternatives were discussed: yes     Risks discussed:  Unsuccessful block, swelling, nerve damage, pain, infection, intravenous injection, allergic reaction and bleeding   Alternatives discussed:  No treatment Universal protocol:    Procedure explained and questions answered to patient or proxy's satisfaction: yes     Patient identity confirmed:  Verbally with patient Indications:    Indications:  Pain relief and procedural anesthesia Location:    Body area:  Trunk   Trunk area nerve blocked: penis block.   Laterality:  Bilateral Pre-procedure details:    Skin preparation:  Alcohol Skin anesthesia:    Skin anesthesia method:  None Procedure details:    Block needle gauge:  25  G   Anesthetic injected:  Lidocaine 1% w/o epi   Steroid injected:  None   Additive injected:  None   Injection procedure:  Anatomic landmarks identified, incremental injection, negative aspiration for blood, introduced needle and anatomic landmarks palpated   Paresthesia:  None Post-procedure details:    Dressing:  None   Outcome:  Anesthesia achieved   Procedure completion:  Tolerated     Medications Ordered in ED Medications  morphine (PF) 2 MG/ML injection 2 mg (2 mg Intravenous Given 06/01/23 0818)  phenylephrine 200 mcg / ml CONC. DILUTION INJ (ED / Urology USE ONLY) (200 mcg Intracavernosal Given 06/01/23 1008)  lidocaine (PF) (XYLOCAINE) 1 % injection 10 mL (10 mLs Infiltration Given 06/01/23 1008)  hydrALAZINE (APRESOLINE) tablet 100 mg (100 mg Oral Given 06/01/23 0955)    ED Course/ Medical Decision Making/ A&P Clinical Course as of 06/01/23 1202  Sun Jun 01, 2023  0953 MAP (mmHg): 125 [CP]  1040 bilaterally of phenylephrine, attempted aspiration without success  Spoke with Dr. Mena Goes with urology who reports swelling expected after 6+ hours, if cylinders are compressible, pain resolved then he is okay for dc at this time [CP]  1114 On recheck patient with some minor persistent swelling but his cylinders are compressible, numbing from block has resolved and patient reports only some pain from the injections, otherwise with pain significantly improved at this time [CP]    Clinical Course User Index [CP] Olene Floss, PA-C                             Medical Decision Making  This patient is a 47 y.o. male  who presents to the ED for concern of priapism.   Differential diagnoses prior to evaluation: The emergent differential diagnosis includes, but is not limited to,  drug induced vs spontaneous . This is not an exhaustive differential.   Past Medical History / Co-morbidities / Social History: Obesity, previous BKA on left, CKD, diabetes  Additional  history: Chart reviewed. Pertinent results include: Reviewed outpatient visits for chronic diabetes, no recent ED visits on file  Physical Exam: Physical exam performed. The pertinent findings include: Swollen and tender corpus cavernosum, very rigid, and tender to touch.  After injecting phenylephrine, and attempted aspiration patient with significant detumescence with some residual swelling, the glans has retracted inside the foreskin, patient has compressible cylinders, and improvement of pain  Lab Tests/Imaging studies: I personally interpreted labs/imaging and the pertinent results include: BMP with mild hyperkalemia, calcium 5.2, BUN, creatinine are overall stable compared to baseline.  He has not taken his loop diuretic hypertensive medication today and we will administer his home medication, otherwise recommended recheck with PCP for blood pressure, and potassium.   Medications: I ordered medication including Morphine for pain, we administered hydralazine for blood pressure, I injected phenylephrine into the corpus, aspirated dark venous blood prior to injection.  I have reviewed the patients home medicines and have made adjustments as needed.   Consults: Spoke with Dr. Mena Goes with urology who after description of our procedure, and the appearance of the penis after phenylephrine reported that he is satisfied with description of detumescence, some residual swelling is to be expected after correction for greater than 6 hours, patient will follow-up with urology  Disposition: After consideration of the diagnostic results and the patients response to treatment, I feel that patient with detumescence, after speaking with urology some residual swelling is to be expected, he will follow-up closely with urology, patient understands and agrees to plan, pain significantly improved, swelling significantly improved prior to discharge.   emergency department workup does not suggest an emergent  condition requiring admission or immediate intervention beyond what has been performed at this time. The plan is: as above. The patient is safe for discharge and has been instructed to return immediately for worsening symptoms, change in symptoms or any other concerns.  Final Clinical  Impression(s) / ED Diagnoses Final diagnoses:  Priapism    Rx / DC Orders ED Discharge Orders     None         Olene Floss, PA-C 06/01/23 1202    Loetta Rough, MD 06/01/23 1218

## 2023-06-01 NOTE — Discharge Instructions (Addendum)
Please return to the emergency department if you have return of erection lasting for greater than 4 to 6 hours, and please call urology for follow-up you may have some persistent swelling at the base of the penis secondary to the blood that collected in the shaft during the erection that occurred prior to arrival in the ED.

## 2023-06-09 ENCOUNTER — Ambulatory Visit: Payer: 59 | Admitting: "Endocrinology

## 2023-06-13 ENCOUNTER — Other Ambulatory Visit: Payer: Self-pay | Admitting: Internal Medicine

## 2023-07-11 ENCOUNTER — Ambulatory Visit (INDEPENDENT_AMBULATORY_CARE_PROVIDER_SITE_OTHER): Payer: 59 | Admitting: Podiatry

## 2023-07-11 DIAGNOSIS — L84 Corns and callosities: Secondary | ICD-10-CM

## 2023-07-11 DIAGNOSIS — L97512 Non-pressure chronic ulcer of other part of right foot with fat layer exposed: Secondary | ICD-10-CM | POA: Diagnosis not present

## 2023-07-11 DIAGNOSIS — E0840 Diabetes mellitus due to underlying condition with diabetic neuropathy, unspecified: Secondary | ICD-10-CM

## 2023-07-11 MED ORDER — MUPIROCIN 2 % EX OINT: 1.0000 | TOPICAL_OINTMENT | Freq: Every day | CUTANEOUS | 0 refills | Status: DC

## 2023-07-11 NOTE — Addendum Note (Signed)
Addended by: Carlena Hurl F on: 07/11/2023 12:26 PM   Modules accepted: Orders
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
## 2023-08-01 ENCOUNTER — Ambulatory Visit (INDEPENDENT_AMBULATORY_CARE_PROVIDER_SITE_OTHER): Payer: 59 | Admitting: Podiatry

## 2023-08-01 DIAGNOSIS — I739 Peripheral vascular disease, unspecified: Secondary | ICD-10-CM

## 2023-08-01 DIAGNOSIS — E0840 Diabetes mellitus due to underlying condition with diabetic neuropathy, unspecified: Secondary | ICD-10-CM | POA: Diagnosis not present

## 2023-08-01 DIAGNOSIS — L97512 Non-pressure chronic ulcer of other part of right foot with fat layer exposed: Secondary | ICD-10-CM | POA: Diagnosis not present

## 2023-08-01 DIAGNOSIS — L84 Corns and callosities: Secondary | ICD-10-CM | POA: Diagnosis not present
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
## 2023-08-22 ENCOUNTER — Ambulatory Visit (INDEPENDENT_AMBULATORY_CARE_PROVIDER_SITE_OTHER): Payer: 59 | Admitting: "Endocrinology

## 2023-08-22 ENCOUNTER — Encounter: Payer: Self-pay | Admitting: "Endocrinology

## 2023-08-22 VITALS — BP 140/83 | HR 73 | Ht 70.0 in | Wt 270.6 lb

## 2023-08-22 DIAGNOSIS — E11649 Type 2 diabetes mellitus with hypoglycemia without coma: Secondary | ICD-10-CM | POA: Diagnosis not present

## 2023-08-22 DIAGNOSIS — K912 Postsurgical malabsorption, not elsewhere classified: Secondary | ICD-10-CM

## 2023-08-22 DIAGNOSIS — Z9884 Bariatric surgery status: Secondary | ICD-10-CM | POA: Diagnosis not present

## 2023-08-22 DIAGNOSIS — Z7984 Long term (current) use of oral hypoglycemic drugs: Secondary | ICD-10-CM | POA: Diagnosis not present

## 2023-08-22 LAB — POCT GLYCOSYLATED HEMOGLOBIN (HGB A1C): Hemoglobin A1C: 5.5 % (ref 4.0–5.6)

## 2023-08-22 MED ORDER — ACARBOSE 25 MG PO TABS: 25.0000 mg | ORAL_TABLET | Freq: Three times a day (TID) | ORAL | 2 refills | Status: DC

## 2023-08-22 MED ORDER — ACARBOSE 25 MG PO TABS
25.0000 mg | ORAL_TABLET | Freq: Every day | ORAL | 1 refills | Status: DC
Start: 2023-08-22 — End: 2023-10-03
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
## 2023-09-11 ENCOUNTER — Encounter: Payer: Self-pay | Admitting: "Endocrinology

## 2023-09-11 ENCOUNTER — Other Ambulatory Visit: Payer: Self-pay | Admitting: "Endocrinology

## 2023-09-11 ENCOUNTER — Ambulatory Visit (INDEPENDENT_AMBULATORY_CARE_PROVIDER_SITE_OTHER): Payer: 59 | Admitting: "Endocrinology

## 2023-09-11 VITALS — BP 140/102 | HR 69 | Ht 70.0 in | Wt 276.6 lb

## 2023-09-11 DIAGNOSIS — K912 Postsurgical malabsorption, not elsewhere classified: Secondary | ICD-10-CM

## 2023-09-11 DIAGNOSIS — Z9884 Bariatric surgery status: Secondary | ICD-10-CM | POA: Diagnosis not present
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
## 2023-09-12 ENCOUNTER — Ambulatory Visit: Payer: 59 | Admitting: "Endocrinology

## 2023-10-03 ENCOUNTER — Ambulatory Visit (INDEPENDENT_AMBULATORY_CARE_PROVIDER_SITE_OTHER): Payer: 59 | Admitting: Podiatry

## 2023-10-03 DIAGNOSIS — E0840 Diabetes mellitus due to underlying condition with diabetic neuropathy, unspecified: Secondary | ICD-10-CM | POA: Diagnosis not present

## 2023-10-03 DIAGNOSIS — L84 Corns and callosities: Secondary | ICD-10-CM | POA: Diagnosis not present

## 2023-10-03 DIAGNOSIS — M79674 Pain in right toe(s): Secondary | ICD-10-CM | POA: Diagnosis not present

## 2023-10-03 DIAGNOSIS — B351 Tinea unguium: Secondary | ICD-10-CM | POA: Diagnosis not present

## 2023-10-03 DIAGNOSIS — Z89512 Acquired absence of left leg below knee: Secondary | ICD-10-CM

## 2023-10-03 NOTE — Progress Notes (Signed)
  Subjective:  Patient ID: Dwayne Rocha, male    DOB: 1976/04/09,  MRN: [id number]  Chief Complaint  Patient presents with   Wound Check    Patient is here for wound check F/U    47 y.o. male presents with the above complaint. History confirmed with patient. Patient presenting for recheck of preulcerative lesion and prior ulceration on the right second toe.  Does have a history of type 2 diabetes with neuropathy.  History of prior left BKA.  Objective:  Physical Exam: warm, good capillary refill nail exam onychomycosis of the toenails, onycholysis, and dystrophic nails DP pulses palpable, PT pulses palpable, and protective sensation absent Left Foot: Prior BKA Right Foot: Prior right hallux amputation partially.  Ulceration healed at the distal tuft of the right second toe however there is significant hyperkeratotic tissue preulcerative calluses present but no underlying wound  Assessment:   1. Pre-ulcerative calluses   2. Diabetes mellitus due to underlying condition with diabetic neuropathy, without long-term current use of insulin (HCC)   3. History of left below knee amputation [facility])      Plan:  Patient was evaluated and treated and all questions answered.  #Hyperkeratotic lesions/pre ulcerative calluses present distal aspect right second toe All symptomatic hyperkeratoses x 1 separate lesions were safely debrided with a sterile #10 blade to patient's level of comfort without incident. We discussed preventative and palliative care of these lesions including supportive and accommodative shoegear, padding, prefabricated and custom molded accommodative orthoses, use of a pumice stone and lotions/creams daily.  #Onychomycosis with pain  -Nails palliatively debrided as below. -Educated on self-care  Procedure: Nail Debridement Rationale: Pain Type of Debridement: manual, sharp debridement. Instrumentation: Nail nipper, rotary burr. Number of Nails: 4  Return in about 9 weeks  (around 12/05/2023) for [facility].         Corinna Gab, DPM [facility] / [facility]

## 2023-10-15 ENCOUNTER — Ambulatory Visit: Payer: 59 | Admitting: "Endocrinology

## 2023-10-23 ENCOUNTER — Encounter: Payer: Self-pay | Admitting: "Endocrinology

## 2023-10-23 ENCOUNTER — Ambulatory Visit (INDEPENDENT_AMBULATORY_CARE_PROVIDER_SITE_OTHER): Payer: 59 | Admitting: "Endocrinology

## 2023-10-23 VITALS — BP 144/80 | HR 83 | Ht 70.0 in | Wt 274.6 lb

## 2023-10-23 DIAGNOSIS — K912 Postsurgical malabsorption, not elsewhere classified: Secondary | ICD-10-CM | POA: Diagnosis not present

## 2023-10-23 DIAGNOSIS — Z7984 Long term (current) use of oral hypoglycemic drugs: Secondary | ICD-10-CM

## 2023-10-23 DIAGNOSIS — E7800 Pure hypercholesterolemia, unspecified: Secondary | ICD-10-CM

## 2023-10-23 DIAGNOSIS — Z9884 Bariatric surgery status: Secondary | ICD-10-CM | POA: Diagnosis not present

## 2023-10-23 DIAGNOSIS — E11649 Type 2 diabetes mellitus with hypoglycemia without coma: Secondary | ICD-10-CM | POA: Diagnosis not present

## 2023-10-23 MED ORDER — ACARBOSE 25 MG PO TABS: 25.0000 mg | ORAL_TABLET | Freq: Three times a day (TID) | ORAL | 3 refills | Status: DC
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
## 2023-11-14 ENCOUNTER — Other Ambulatory Visit: Payer: Self-pay | Admitting: "Endocrinology

## 2023-11-14 NOTE — Telephone Encounter (Signed)
Crestor refill request complete

## 2023-11-20 ENCOUNTER — Ambulatory Visit (INDEPENDENT_AMBULATORY_CARE_PROVIDER_SITE_OTHER): Payer: 59 | Admitting: "Endocrinology

## 2023-11-20 VITALS — BP 122/70 | HR 80 | Resp 20 | Ht 70.0 in | Wt 267.0 lb

## 2023-11-20 DIAGNOSIS — Z9884 Bariatric surgery status: Secondary | ICD-10-CM | POA: Diagnosis not present

## 2023-11-20 DIAGNOSIS — E7800 Pure hypercholesterolemia, unspecified: Secondary | ICD-10-CM

## 2023-11-20 DIAGNOSIS — Z7984 Long term (current) use of oral hypoglycemic drugs: Secondary | ICD-10-CM | POA: Diagnosis not present

## 2023-11-20 DIAGNOSIS — E11649 Type 2 diabetes mellitus with hypoglycemia without coma: Secondary | ICD-10-CM | POA: Diagnosis not present

## 2023-11-20 LAB — POCT GLYCOSYLATED HEMOGLOBIN (HGB A1C): Hemoglobin A1C: 5.8 % — AB (ref 4.0–5.6)

## 2023-11-20 MED ORDER — ACARBOSE 25 MG PO TABS
25.0000 mg | ORAL_TABLET | Freq: Three times a day (TID) | ORAL | 1 refills | Status: DC
Start: 2023-11-20 — End: 2024-05-19
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
## 2023-11-21 ENCOUNTER — Encounter: Payer: Self-pay | Admitting: "Endocrinology

## 2023-11-21 ENCOUNTER — Ambulatory Visit: Payer: 59 | Admitting: "Endocrinology

## 2023-12-09 ENCOUNTER — Ambulatory Visit: Payer: 59 | Admitting: Podiatry

## 2023-12-25 ENCOUNTER — Ambulatory Visit (AMBULATORY_SURGERY_CENTER): Payer: 59

## 2023-12-25 VITALS — Ht 70.0 in | Wt 280.8 lb

## 2023-12-25 DIAGNOSIS — Z1211 Encounter for screening for malignant neoplasm of colon: Secondary | ICD-10-CM

## 2023-12-25 MED ORDER — SUFLAVE 178.7 G PO SOLR
1.0000 | Freq: Once | ORAL | 0 refills | Status: AC
Start: 2023-12-25 — End: 2023-12-25

## 2023-12-25 NOTE — Progress Notes (Signed)

## 2024-01-20 ENCOUNTER — Encounter: Payer: Self-pay | Admitting: Podiatry

## 2024-01-20 ENCOUNTER — Ambulatory Visit (INDEPENDENT_AMBULATORY_CARE_PROVIDER_SITE_OTHER): Payer: 59 | Admitting: Podiatry

## 2024-01-20 ENCOUNTER — Other Ambulatory Visit: Payer: Self-pay | Admitting: Podiatry

## 2024-01-20 DIAGNOSIS — L84 Corns and callosities: Secondary | ICD-10-CM

## 2024-01-20 DIAGNOSIS — B351 Tinea unguium: Secondary | ICD-10-CM | POA: Diagnosis not present

## 2024-01-20 DIAGNOSIS — M79674 Pain in right toe(s): Secondary | ICD-10-CM | POA: Diagnosis not present

## 2024-01-20 DIAGNOSIS — E0840 Diabetes mellitus due to underlying condition with diabetic neuropathy, unspecified: Secondary | ICD-10-CM

## 2024-01-20 MED ORDER — MUPIROCIN 2 % EX OINT: 1.0000 | TOPICAL_OINTMENT | Freq: Every day | CUTANEOUS | 0 refills | Status: DC
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
## 2024-01-21 ENCOUNTER — Encounter: Payer: Self-pay | Admitting: Internal Medicine

## 2024-01-21 ENCOUNTER — Ambulatory Visit: Payer: 59 | Admitting: Internal Medicine

## 2024-01-21 VITALS — BP 183/108 | HR 71 | Temp 97.8°F | Resp 11 | Ht 70.0 in | Wt 280.8 lb

## 2024-01-21 DIAGNOSIS — D123 Benign neoplasm of transverse colon: Secondary | ICD-10-CM | POA: Diagnosis not present

## 2024-01-21 DIAGNOSIS — D120 Benign neoplasm of cecum: Secondary | ICD-10-CM

## 2024-01-21 DIAGNOSIS — K5730 Diverticulosis of large intestine without perforation or abscess without bleeding: Secondary | ICD-10-CM | POA: Diagnosis not present

## 2024-01-21 DIAGNOSIS — Z1211 Encounter for screening for malignant neoplasm of colon: Secondary | ICD-10-CM | POA: Diagnosis present

## 2024-01-21 DIAGNOSIS — D128 Benign neoplasm of rectum: Secondary | ICD-10-CM

## 2024-01-21 DIAGNOSIS — K5900 Constipation, unspecified: Secondary | ICD-10-CM

## 2024-01-21 MED ORDER — LINACLOTIDE 145 MCG PO CAPS: 145.0000 ug | ORAL_CAPSULE | Freq: Every day | ORAL | 2 refills | Status: DC

## 2024-01-21 MED ORDER — SODIUM CHLORIDE 0.9 % IV SOLN: 500.0000 mL | Freq: Once | INTRAVENOUS | Status: DC
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
## 2024-01-21 NOTE — Progress Notes (Signed)
 Called to room to assist during endoscopic procedure.  Patient ID and intended procedure confirmed with present staff. Received instructions for my participation in the procedure from the performing physician.

## 2024-01-21 NOTE — Patient Instructions (Signed)
 DISCHARGE INSTRUCTIONS GIVEN. HANDOUTS ON POLYPS AND Diverticulosis. Resume previous medications. YOU HAD AN ENDOSCOPIC PROCEDURE TODAY AT THE [facility]:   Refer to the procedure report that was given to you for any specific questions about what was found during the examination.  If the procedure report does not answer your questions, please call your gastroenterologist to clarify.  If you requested that your care partner not be given the details of your procedure findings, then the procedure report has been included in a sealed envelope for you to review at your convenience later.  YOU SHOULD EXPECT: Some feelings of bloating in the abdomen. Passage of more gas than usual.  Walking can help get rid of the air that was put into your GI tract during the procedure and reduce the bloating. If you had a lower endoscopy (such as a colonoscopy or flexible sigmoidoscopy) you may notice spotting of blood in your stool or on the toilet paper. If you underwent a bowel prep for your procedure, you may not have a normal bowel movement for a few days.  Please Note:  You might notice some irritation and congestion in your nose or some drainage.  This is from the oxygen used during your procedure.  There is no need for concern and it should clear up in a day or so.  SYMPTOMS TO REPORT IMMEDIATELY:  Following lower endoscopy (colonoscopy or flexible sigmoidoscopy):  Excessive amounts of blood in the stool  Significant tenderness or worsening of abdominal pains  Swelling of the abdomen that is new, acute  Fever of 100F or higher   For urgent or emergent issues, a gastroenterologist can be reached at any hour by calling [phone]. Do not use MyChart messaging for urgent concerns.    DIET:  We do recommend a small meal at first, but then you may proceed to your regular diet.  Drink plenty of fluids but you should avoid alcoholic beverages for 24 hours.  ACTIVITY:  You should plan to take it  easy for the rest of today and you should NOT DRIVE or use heavy machinery until tomorrow (because of the sedation medicines used during the test).    FOLLOW UP: Our staff will call the number listed on your records the next business day following your procedure.  We will call around 7:15- 8:00 am to check on you and address any questions or concerns that you may have regarding the information given to you following your procedure. If we do not reach you, we will leave a message.     If any biopsies were taken you will be contacted by phone or by letter within the next 1-3 weeks.  Please call us at [phone] if you have not heard about the biopsies in 3 weeks.    SIGNATURES/CONFIDENTIALITY: You and/or your care partner have signed paperwork which will be entered into your electronic medical record.  These signatures attest to the fact that that the information above on your After Visit Summary has been reviewed and is understood.  Full responsibility of the confidentiality of this discharge information lies with you and/or your care-partner.Marland Kitchen

## 2024-01-21 NOTE — Progress Notes (Signed)
 Pt's states no medical or surgical changes since previsit or office visit.

## 2024-01-21 NOTE — Op Note (Signed)
 [facility] Patient Name: Dwayne Rocha Procedure Date: 01/21/2024 3:59 PM MRN: [id number] Endoscopist: Particia Lather , , [id number] Age: 48 Referring MD:  Date of Birth: Feb 21, 1976 Gender: Male Account #: [id number] Procedure:                Colonoscopy Indications:              Screening for colorectal malignant neoplasm, This                            is the patient's first colonoscopy Medicines:                Monitored Anesthesia Care Procedure:                Pre-Anesthesia Assessment:                           - Prior to the procedure, a History and Physical                            was performed, and patient medications and                            allergies were reviewed. The patient's tolerance of                            previous anesthesia was also reviewed. The risks                            and benefits of the procedure and the sedation                            options and risks were discussed with the patient.                            All questions were answered, and informed consent                            was obtained. Prior Anticoagulants: The patient has                            taken no anticoagulant or antiplatelet agents. ASA                            Grade Assessment: III - A patient with severe                            systemic disease. After reviewing the risks and                            benefits, the patient was deemed in satisfactory                            condition to undergo the procedure.  After obtaining informed consent, the colonoscope                            was passed under direct vision. Throughout the                            procedure, the patient's blood pressure, pulse, and                            oxygen saturations were monitored continuously. The                            Olympus Scope SN: [id number] was introduced through                            the anus and  advanced to the the cecum, identified                            by appendiceal orifice and ileocecal valve. The                            colonoscopy was performed without difficulty. The                            patient tolerated the procedure well. The quality                            of the bowel preparation was excellent. The                            ileocecal valve, appendiceal orifice, and rectum                            were photographed. Scope In: 4:14:59 PM Scope Out: 4:30:01 PM Scope Withdrawal Time: 0 hours 11 minutes 19 seconds  Total Procedure Duration: 0 hours 15 minutes 2 seconds  Findings:                 Multiple diverticula were found in the entire colon.                           Three sessile polyps were found in the transverse                            colon and cecum. The polyps were 3 to 5 mm in size.                            These polyps were removed with a cold snare.                            Resection and retrieval were complete.                           A 4 mm polyp was found  in the rectum. The polyp was                            sessile. The polyp was removed with a cold snare.                            Resection and retrieval were complete. Complications:            No immediate complications. Estimated Blood Loss:     Estimated blood loss was minimal. Impression:               - Diverticulosis in the entire examined colon.                           - Three 3 to 5 mm polyps in the transverse colon                            and in the cecum, removed with a cold snare.                            Resected and retrieved.                           - One 4 mm polyp in the rectum, removed with a cold                            snare. Resected and retrieved. Recommendation:           - Discharge patient to home (with escort).                           - Await pathology results.                           - The findings and recommendations were  discussed                            with the patient. Dr Particia Lather "Alan Ripper" Leonides Schanz,  01/21/2024 4:35:19 PM

## 2024-01-21 NOTE — Progress Notes (Signed)
 Pt A/O x 3, gd SR's, pleased with anesthesia, report to RN

## 2024-01-22 ENCOUNTER — Telehealth: Payer: Self-pay

## 2024-01-22 NOTE — Telephone Encounter (Signed)
  Follow up Call-     01/21/2024    2:59 PM  Call back number  Post procedure Call Back phone  # [phone]  Permission to leave phone message Yes     Left message

## 2024-01-26 ENCOUNTER — Encounter: Payer: Self-pay | Admitting: Internal Medicine

## 2024-01-26 LAB — SURGICAL PATHOLOGY

## 2024-02-18 ENCOUNTER — Ambulatory Visit (INDEPENDENT_AMBULATORY_CARE_PROVIDER_SITE_OTHER): Payer: 59 | Admitting: "Endocrinology

## 2024-02-18 ENCOUNTER — Encounter: Payer: Self-pay | Admitting: "Endocrinology

## 2024-02-18 VITALS — BP 116/80 | HR 94 | Ht 70.0 in | Wt 281.0 lb

## 2024-02-18 DIAGNOSIS — Z7984 Long term (current) use of oral hypoglycemic drugs: Secondary | ICD-10-CM

## 2024-02-18 DIAGNOSIS — E11649 Type 2 diabetes mellitus with hypoglycemia without coma: Secondary | ICD-10-CM | POA: Diagnosis not present

## 2024-02-18 DIAGNOSIS — E162 Hypoglycemia, unspecified: Secondary | ICD-10-CM

## 2024-02-18 DIAGNOSIS — Z9884 Bariatric surgery status: Secondary | ICD-10-CM | POA: Diagnosis not present

## 2024-02-18 DIAGNOSIS — E7800 Pure hypercholesterolemia, unspecified: Secondary | ICD-10-CM

## 2024-02-18 LAB — POCT GLYCOSYLATED HEMOGLOBIN (HGB A1C): Hemoglobin A1C: 5.7 % — AB (ref 4.0–5.6)

## 2024-02-18 MED ORDER — LANCET DEVICE MISC: 1.0000 | Freq: Three times a day (TID) | 0 refills | Status: AC

## 2024-02-18 MED ORDER — LANCETS MISC. MISC: 1.0000 | Freq: Three times a day (TID) | 3 refills | Status: AC

## 2024-02-18 MED ORDER — BLOOD GLUCOSE TEST VI STRP: 1.0000 | ORAL_STRIP | Freq: Three times a day (TID) | 3 refills | Status: DC
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
## 2024-02-18 NOTE — Patient Instructions (Signed)
  Take a 15 gm snack of carbohydrate at bedtime before you go to sleep if your blood sugar is less than 100.  If you are going to fast after midnight for a test or procedure, ask your physician for instructions on how to reduce/decrease your insulin dose.  Call if blood sugar is less than 70 or consistently above 250  -Treating a low sugar by rule of 15  (15 gms of sugar every 15 min until sugar is more than 70) If you feel your sugar is low, test your sugar to be sure If your sugar is low (less than 70), then take 15 grams of a fast acting Carbohydrate (3-4 glucose tablets or glucose gel or 4 ounces of juice or regular soda) Recheck your sugar 15 min after treating low to make sure it is more than 70 If sugar is still less than 70, treat again with 15 grams of carbohydrate                Don't drive the hour of hypoglycemia  If unconscious/unable to eat or drink by mouth, use glucagon injection or nasal spray baqsimi and call 911. Can repeat again in 15 min if still unconscious.  Call your doctor if blood sugar is less than 70 or consistently above 250

## 2024-02-20 ENCOUNTER — Other Ambulatory Visit: Payer: Self-pay | Admitting: Student

## 2024-02-20 DIAGNOSIS — R1012 Left upper quadrant pain: Secondary | ICD-10-CM

## 2024-02-24 ENCOUNTER — Telehealth: Payer: Self-pay | Admitting: Internal Medicine

## 2024-02-24 ENCOUNTER — Ambulatory Visit
Admission: RE | Admit: 2024-02-24 | Discharge: 2024-02-24 | Disposition: A | Discharge status: Home / Self Care | Source: Ambulatory Visit | Attending: Student | Admitting: Student

## 2024-02-24 DIAGNOSIS — R1012 Left upper quadrant pain: Secondary | ICD-10-CM

## 2024-02-24 NOTE — Telephone Encounter (Signed)
 Inbound call from patient stating that he has been having abdominal pain for the last 2 weeks and when he eats the pain seems to worsen. Patient was scheduled for 5/1 at  3:00 with Deanna May. Patient is requesting a call from the nurse to discuss in the meantime. Please advise.

## 2024-02-25 NOTE — Telephone Encounter (Signed)
 Pt stated that he is continuing to have upper abdominal pain for going on 3 weeks now. Worse after eating, Pt described the pain as dull,sharp and burning at times. Pt was rescheduled for a sooner appointment.  Pt was scheduled for 02/26/2024 at 2:30 PM to see Deanna May NP Pt made aware.  Pt verbalized understanding with all questions answered.

## 2024-02-25 NOTE — Telephone Encounter (Signed)
 Left message for pt to call back

## 2024-02-26 ENCOUNTER — Ambulatory Visit (INDEPENDENT_AMBULATORY_CARE_PROVIDER_SITE_OTHER): Admitting: Gastroenterology

## 2024-02-26 ENCOUNTER — Encounter: Payer: Self-pay | Admitting: Gastroenterology

## 2024-02-26 VITALS — BP 150/68 | HR 99 | Ht 70.0 in | Wt 272.0 lb

## 2024-02-26 DIAGNOSIS — R1013 Epigastric pain: Secondary | ICD-10-CM

## 2024-02-26 DIAGNOSIS — Z9884 Bariatric surgery status: Secondary | ICD-10-CM | POA: Diagnosis not present

## 2024-02-26 DIAGNOSIS — K219 Gastro-esophageal reflux disease without esophagitis: Secondary | ICD-10-CM | POA: Diagnosis not present

## 2024-02-26 DIAGNOSIS — R110 Nausea: Secondary | ICD-10-CM | POA: Diagnosis not present
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
## 2024-02-26 NOTE — Progress Notes (Signed)
 I agree with the assessment and plan as outlined by Ms. May.

## 2024-02-26 NOTE — Patient Instructions (Addendum)
 You have been scheduled for an endoscopy. Please follow written instructions given to you at your visit today.  If you use inhalers (even only as needed), please bring them with you on the day of your procedure.  If you take any of the following medications, they will need to be adjusted prior to your procedure:   DO NOT TAKE 7 DAYS PRIOR TO TEST- Trulicity (dulaglutide) Ozempic, Wegovy (semaglutide) Mounjaro (tirzepatide) Bydureon Bcise (exanatide extended release)  DO NOT TAKE 1 DAY PRIOR TO YOUR TEST Rybelsus (semaglutide) Adlyxin (lixisenatide) Victoza (liraglutide) Byetta (exanatide) ___________________________________________________________________________  Start Pantoprazole 40 mg twice daily  Strict GERD diet, no late meals 3-4 hours  No NSAIDs  Due to recent changes in healthcare laws, you may see the results of your imaging and laboratory studies on MyChart before your provider has had a chance to review them.  We understand that in some cases there may be results that are confusing or concerning to you. Not all laboratory results come back in the same time frame and the provider may be waiting for multiple results in order to interpret others.  Please give us  48 hours in order for your provider to thoroughly review all the results before contacting the office for clarification of your results.   It was a pleasure to see you today!  Thank you for trusting me with your gastrointestinal care!

## 2024-03-03 ENCOUNTER — Encounter: Payer: Self-pay | Admitting: "Endocrinology

## 2024-03-04 ENCOUNTER — Encounter: Payer: Self-pay | Admitting: Internal Medicine

## 2024-03-11 ENCOUNTER — Encounter: Payer: Self-pay | Admitting: Internal Medicine

## 2024-03-11 ENCOUNTER — Ambulatory Visit: Admitting: Gastroenterology

## 2024-03-11 ENCOUNTER — Ambulatory Visit: Admitting: Internal Medicine

## 2024-03-11 VITALS — BP 103/48 | HR 68 | Temp 98.2°F | Resp 10 | Ht 70.0 in | Wt 272.0 lb

## 2024-03-11 DIAGNOSIS — K449 Diaphragmatic hernia without obstruction or gangrene: Secondary | ICD-10-CM | POA: Diagnosis not present

## 2024-03-11 DIAGNOSIS — K219 Gastro-esophageal reflux disease without esophagitis: Secondary | ICD-10-CM

## 2024-03-11 DIAGNOSIS — K209 Esophagitis, unspecified without bleeding: Secondary | ICD-10-CM

## 2024-03-11 DIAGNOSIS — K2970 Gastritis, unspecified, without bleeding: Secondary | ICD-10-CM | POA: Diagnosis not present

## 2024-03-11 DIAGNOSIS — Z9884 Bariatric surgery status: Secondary | ICD-10-CM

## 2024-03-11 DIAGNOSIS — K3189 Other diseases of stomach and duodenum: Secondary | ICD-10-CM | POA: Diagnosis not present

## 2024-03-11 DIAGNOSIS — R1013 Epigastric pain: Secondary | ICD-10-CM

## 2024-03-11 DIAGNOSIS — K319 Disease of stomach and duodenum, unspecified: Secondary | ICD-10-CM | POA: Diagnosis not present

## 2024-03-11 DIAGNOSIS — K2100 Gastro-esophageal reflux disease with esophagitis, without bleeding: Secondary | ICD-10-CM | POA: Diagnosis present

## 2024-03-11 DIAGNOSIS — R110 Nausea: Secondary | ICD-10-CM

## 2024-03-11 MED ORDER — SODIUM CHLORIDE 0.9 % IV SOLN: 500.0000 mL | Freq: Once | INTRAVENOUS | Status: DC

## 2024-03-11 MED ORDER — SUCRALFATE 1 GM/10ML PO SUSP: 1.0000 g | Freq: Four times a day (QID) | ORAL | 1 refills | Status: DC

## 2024-03-11 MED ORDER — PANTOPRAZOLE SODIUM 40 MG PO TBEC: 40.0000 mg | DELAYED_RELEASE_TABLET | Freq: Two times a day (BID) | ORAL | 0 refills | Status: DC
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
## 2024-03-11 NOTE — Patient Instructions (Signed)
 Awaiting pathology results. Use Protonix  40 MG PO BID for 10 weeks Use Sucralfate  suspension 1 Gram PO QID for 4 weeks Repeat Upper endoscopy in 2 months to check healing of esophagitis.  YOU HAD AN ENDOSCOPIC PROCEDURE TODAY AT THE [facility]:   Refer to the procedure report that was given to you for any specific questions about what was found during the examination.  If the procedure report does not answer your questions, please call your gastroenterologist to clarify.  If you requested that your care partner not be given the details of your procedure findings, then the procedure report has been included in a sealed envelope for you to review at your convenience later.  YOU SHOULD EXPECT: Some feelings of bloating in the abdomen. Passage of more gas than usual.  Walking can help get rid of the air that was put into your GI tract during the procedure and reduce the bloating. If you had a lower endoscopy (such as a colonoscopy or flexible sigmoidoscopy) you may notice spotting of blood in your stool or on the toilet paper. If you underwent a bowel prep for your procedure, you may not have a normal bowel movement for a few days.  Please Note:  You might notice some irritation and congestion in your nose or some drainage.  This is from the oxygen used during your procedure.  There is no need for concern and it should clear up in a day or so.  SYMPTOMS TO REPORT IMMEDIATELY:  Following upper endoscopy (EGD)  Vomiting of blood or coffee ground material  New chest pain or pain under the shoulder blades  Painful or persistently difficult swallowing  New shortness of breath  Fever of 100F or higher  Black, tarry-looking stools  For urgent or emergent issues, a gastroenterologist can be reached at any hour by calling [phone]. Do not use MyChart messaging for urgent concerns.    DIET:  We do recommend a small meal at first, but then you may proceed to your regular diet.  Drink  plenty of fluids but you should avoid alcoholic beverages for 24 hours.  ACTIVITY:  You should plan to take it easy for the rest of today and you should NOT DRIVE or use heavy machinery until tomorrow (because of the sedation medicines used during the test).    FOLLOW UP: Our staff will call the number listed on your records the next business day following your procedure.  We will call around 7:15- 8:00 am to check on you and address any questions or concerns that you may have regarding the information given to you following your procedure. If we do not reach you, we will leave a message.     If any biopsies were taken you will be contacted by phone or by letter within the next 1-3 weeks.  Please call us  at [phone] if you have not heard about the biopsies in 3 weeks.    SIGNATURES/CONFIDENTIALITY: You and/or your care partner have signed paperwork which will be entered into your electronic medical record.  These signatures attest to the fact that that the information above on your After Visit Summary has been reviewed and is understood.  Full responsibility of the confidentiality of this discharge information lies with you and/or your care-partner.

## 2024-03-11 NOTE — Op Note (Addendum)
 [facility] Patient Name: Dwayne Rocha Procedure Date: 03/11/2024 10:00 AM MRN: [id number] Endoscopist: Freada Jacobs Penasco , , [id number] Age: 48 Referring MD:  Date of Birth: 11-09-1976 Gender: Male Account #: [id number] Procedure:                Upper GI endoscopy Indications:              Epigastric abdominal pain, Heartburn, Nausea Medicines:                Monitored Anesthesia Care Procedure:                Pre-Anesthesia Assessment:                           - Prior to the procedure, a History and Physical                            was performed, and patient medications and                            allergies were reviewed. The patient's tolerance of                            previous anesthesia was also reviewed. The risks                            and benefits of the procedure and the sedation                            options and risks were discussed with the patient.                            All questions were answered, and informed consent                            was obtained. Prior Anticoagulants: The patient has                            taken no anticoagulant or antiplatelet agents. ASA                            Grade Assessment: II - A patient with mild systemic                            disease. After reviewing the risks and benefits,                            the patient was deemed in satisfactory condition to                            undergo the procedure.                           After obtaining informed consent, the endoscope was  passed under direct vision. Throughout the                            procedure, the patient's blood pressure, pulse, and                            oxygen saturations were monitored continuously. The                            Olympus Scope SN [id number] was introduced through the                            mouth, and advanced to the second part of duodenum.                             The upper GI endoscopy was accomplished without                            difficulty. The patient tolerated the procedure                            well. Scope In: Scope Out: Findings:                 LA Grade C (one or more mucosal breaks continuous                            between tops of 2 or more mucosal folds, less than                            75% circumference) esophagitis with no bleeding was                            found in the distal esophagus. Biopsies were taken                            with a cold forceps for histology.                           A hiatal hernia was present.                           Localized mild inflammation characterized by                            congestion (edema), erosions and erythema was found                            in the gastric body. Biopsies were taken with a                            cold forceps for histology.                           Evidence of  a Roux-en-Y gastrojejunostomy was                            found. The gastrojejunal anastomosis was                            characterized by healthy appearing mucosa. This was                            traversed.                           The examined jejunum was normal. Complications:            No immediate complications. Estimated Blood Loss:     Estimated blood loss was minimal. Impression:               - LA Grade C esophagitis with no bleeding. Biopsied.                           - Hiatal hernia.                           - Gastritis. Biopsied.                           - Roux-en-Y gastrojejunostomy with gastrojejunal                            anastomosis characterized by healthy appearing                            mucosa.                           - Normal examined jejunum. Recommendation:           - Discharge patient to home (with escort).                           - Await pathology results.                           - Use Protonix  (pantoprazole ) 40 mg PO BID for  10                            weeks.                           - Use sucralfate  suspension 1 gram PO QID for 4                            weeks.                           - Repeat upper endoscopy in 2 months to check                            healing of  esophagitis.                           - The findings and recommendations were discussed                            with the patient. Dr Pedro Bourgeois "Anastacio Balm" Dwayne Rocha,  03/11/2024 10:52:59 AM

## 2024-03-11 NOTE — Progress Notes (Signed)
 Called to room to assist during endoscopic procedure.  Patient ID and intended procedure confirmed with present staff. Received instructions for my participation in the procedure from the performing physician.

## 2024-03-11 NOTE — Progress Notes (Signed)
 Pt sedate, gd SR's, VSS, report to RN

## 2024-03-12 ENCOUNTER — Telehealth: Payer: Self-pay

## 2024-03-12 NOTE — Telephone Encounter (Signed)
 Follow up call to pt, lm for pt to call if having any difficulty with normal activities or eating and drinking.  Also to call if any other questions or concerns.

## 2024-03-15 LAB — SURGICAL PATHOLOGY

## 2024-03-22 ENCOUNTER — Ambulatory Visit (INDEPENDENT_AMBULATORY_CARE_PROVIDER_SITE_OTHER): Admitting: Podiatry

## 2024-03-22 DIAGNOSIS — L84 Corns and callosities: Secondary | ICD-10-CM | POA: Diagnosis not present

## 2024-03-22 DIAGNOSIS — E0840 Diabetes mellitus due to underlying condition with diabetic neuropathy, unspecified: Secondary | ICD-10-CM

## 2024-03-22 DIAGNOSIS — B351 Tinea unguium: Secondary | ICD-10-CM

## 2024-03-22 DIAGNOSIS — E119 Type 2 diabetes mellitus without complications: Secondary | ICD-10-CM
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
## 2024-04-02 DIAGNOSIS — Z0271 Encounter for disability determination: Secondary | ICD-10-CM

## 2024-04-02 NOTE — Telephone Encounter (Signed)
 Recd form from [employer]. Faxed form/notes [phone] See prev notes

## 2024-04-02 NOTE — Telephone Encounter (Signed)
 Angie lft mess on my vmail about forms being sent to us ? I lft mess on her vmail at [phone] and gave my fax# to send forms for pt.

## 2024-04-26 ENCOUNTER — Ambulatory Visit: Payer: Self-pay | Admitting: Internal Medicine

## 2024-05-11 ENCOUNTER — Encounter: Payer: Self-pay | Admitting: Internal Medicine

## 2024-05-11 ENCOUNTER — Ambulatory Visit: Admitting: Internal Medicine

## 2024-05-11 VITALS — BP 99/57 | HR 74 | Temp 98.0°F | Resp 9 | Ht 70.0 in | Wt 272.0 lb

## 2024-05-11 DIAGNOSIS — K3189 Other diseases of stomach and duodenum: Secondary | ICD-10-CM | POA: Diagnosis not present

## 2024-05-11 DIAGNOSIS — K209 Esophagitis, unspecified without bleeding: Secondary | ICD-10-CM | POA: Diagnosis not present

## 2024-05-11 DIAGNOSIS — K219 Gastro-esophageal reflux disease without esophagitis: Secondary | ICD-10-CM

## 2024-05-11 DIAGNOSIS — Z9884 Bariatric surgery status: Secondary | ICD-10-CM

## 2024-05-11 DIAGNOSIS — R1013 Epigastric pain: Secondary | ICD-10-CM

## 2024-05-11 DIAGNOSIS — R110 Nausea: Secondary | ICD-10-CM

## 2024-05-11 DIAGNOSIS — K449 Diaphragmatic hernia without obstruction or gangrene: Secondary | ICD-10-CM

## 2024-05-11 DIAGNOSIS — K208 Other esophagitis without bleeding: Secondary | ICD-10-CM | POA: Diagnosis not present

## 2024-05-11 MED ORDER — PANTOPRAZOLE SODIUM 40 MG PO TBEC: 40.0000 mg | DELAYED_RELEASE_TABLET | Freq: Every day | ORAL | 3 refills | Status: AC

## 2024-05-11 MED ORDER — SODIUM CHLORIDE 0.9 % IV SOLN: 500.0000 mL | INTRAVENOUS | Status: DC

## 2024-05-11 NOTE — Progress Notes (Signed)
 Report to PACU, RN, vss, BBS= Clear.
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
## 2024-05-11 NOTE — Patient Instructions (Addendum)
 Decrease pantoprazole  to 40 mg daily as a trial.  Please contact office if you need to go back to twice daily.  Follow up in office in 6 months, recall has been placed so office will contact you to schedule.   YOU HAD AN ENDOSCOPIC PROCEDURE TODAY AT THE [facility]:   Refer to the procedure report that was given to you for any specific questions about what was found during the examination.  If the procedure report does not answer your questions, please call your gastroenterologist to clarify.  If you requested that your care partner not be given the details of your procedure findings, then the procedure report has been included in a sealed envelope for you to review at your convenience later.  YOU SHOULD EXPECT: Some feelings of bloating in the abdomen. Passage of more gas than usual.  Walking can help get rid of the air that was put into your GI tract during the procedure and reduce the bloating. If you had a lower endoscopy (such as a colonoscopy or flexible sigmoidoscopy) you may notice spotting of blood in your stool or on the toilet paper. If you underwent a bowel prep for your procedure, you may not have a normal bowel movement for a few days.  Please Note:  You might notice some irritation and congestion in your nose or some drainage.  This is from the oxygen used during your procedure.  There is no need for concern and it should clear up in a day or so.  SYMPTOMS TO REPORT IMMEDIATELY:  Following upper endoscopy (EGD)  Vomiting of blood or coffee ground material  New chest pain or pain under the shoulder blades  Painful or persistently difficult swallowing  New shortness of breath  Fever of 100F or higher  Black, tarry-looking stools  For urgent or emergent issues, a gastroenterologist can be reached at any hour by calling [phone]. Do not use MyChart messaging for urgent concerns.    DIET:  We do recommend a small meal at first, but then you may proceed to your  regular diet.  Drink plenty of fluids but you should avoid alcoholic beverages for 24 hours.  ACTIVITY:  You should plan to take it easy for the rest of today and you should NOT DRIVE or use heavy machinery until tomorrow (because of the sedation medicines used during the test).    FOLLOW UP: Our staff will call the number listed on your records the next business day following your procedure.  We will call around 7:15- 8:00 am to check on you and address any questions or concerns that you may have regarding the information given to you following your procedure. If we do not reach you, we will leave a message.     If any biopsies were taken you will be contacted by phone or by letter within the next 1-3 weeks.  Please call us  at [phone] if you have not heard about the biopsies in 3 weeks.    SIGNATURES/CONFIDENTIALITY: You and/or your care partner have signed paperwork which will be entered into your electronic medical record.  These signatures attest to the fact that that the information above on your After Visit Summary has been reviewed and is understood.  Full responsibility of the confidentiality of this discharge information lies with you and/or your care-partner. \

## 2024-05-11 NOTE — Progress Notes (Signed)
 Called to room to assist during endoscopic procedure.  Patient ID and intended procedure confirmed with present staff. Received instructions for my participation in the procedure from the performing physician.

## 2024-05-11 NOTE — Op Note (Signed)
 [facility] Patient Name: Dwayne Rocha Cutting Procedure Date: 05/11/2024 1:46 PM MRN: [id number] Endoscopist: Rosario Estefana Kidney , , [id number] Age: 48 Referring MD:  Date of Birth: 11-06-1976 Gender: Male Account #: [id number] Procedure:                Upper GI endoscopy Indications:              Epigastric abdominal pain, Heartburn, Follow-up of                            esophagitis Medicines:                Monitored Anesthesia Care Procedure:                Pre-Anesthesia Assessment:                           - Prior to the procedure, a History and Physical                            was performed, and patient medications and                            allergies were reviewed. The patient's tolerance of                            previous anesthesia was also reviewed. The risks                            and benefits of the procedure and the sedation                            options and risks were discussed with the patient.                            All questions were answered, and informed consent                            was obtained. Prior Anticoagulants: The patient has                            taken no anticoagulant or antiplatelet agents. ASA                            Grade Assessment: III - A patient with severe                            systemic disease. After reviewing the risks and                            benefits, the patient was deemed in satisfactory                            condition to undergo the procedure.  After obtaining informed consent, the endoscope was                            passed under direct vision. Throughout the                            procedure, the patient's blood pressure, pulse, and                            oxygen saturations were monitored continuously. The                            GIF [id number] #[id number] was introduced through the                            mouth, and advanced to the second part  of duodenum.                            The upper GI endoscopy was accomplished without                            difficulty. The patient tolerated the procedure                            well. Scope In: Scope Out: Findings:                 Localized mucosal variance characterized by                            nodularity was found in the distal esophagus.                            Biopsies were taken with a cold forceps for                            histology.                           A hiatal hernia was present.                           Evidence of a Roux-en-Y gastrojejunostomy was                            found. The gastrojejunal anastomosis was                            characterized by healthy appearing mucosa. This was                            traversed.                           The examined jejunum was normal. Complications:            No immediate complications. Estimated  Blood Loss:     Estimated blood loss was minimal. Impression:               - Esophageal mucosal variant. Biopsied.                           - Hiatal hernia.                           - Roux-en-Y gastrojejunostomy with gastrojejunal                            anastomosis characterized by healthy appearing                            mucosa.                           - Normal examined jejunum. Recommendation:           - Discharge patient to home (with escort).                           - Await pathology results.                           - Okay to decrease pantoprazole  to 40 mg daily as a                            trial. Please let me know if you do not do well on                            this dosage. We can increase this back up to BID if                            your symptoms were to worsen                           - Return to GI clinic in 6 months                           - The findings and recommendations were discussed                            with the patient. Dr Estefana Federico Rosario Estefana Federico,  05/11/2024 2:02:19 PM

## 2024-05-12 ENCOUNTER — Telehealth: Payer: Self-pay

## 2024-05-12 NOTE — Telephone Encounter (Signed)
  Follow up Call-     05/11/2024    1:16 PM 03/11/2024    9:48 AM 01/21/2024    2:59 PM  Call back number  Post procedure Call Back phone  # [phone]  Permission to leave phone message Yes Yes Yes   Follow up call, LVM.

## 2024-05-13 ENCOUNTER — Encounter (HOSPITAL_BASED_OUTPATIENT_CLINIC_OR_DEPARTMENT_OTHER): Payer: Self-pay | Admitting: Physical Therapy

## 2024-05-13 ENCOUNTER — Ambulatory Visit (HOSPITAL_BASED_OUTPATIENT_CLINIC_OR_DEPARTMENT_OTHER): Attending: Family Medicine | Admitting: Physical Therapy

## 2024-05-13 ENCOUNTER — Other Ambulatory Visit: Payer: Self-pay

## 2024-05-13 DIAGNOSIS — M6281 Muscle weakness (generalized): Secondary | ICD-10-CM | POA: Insufficient documentation

## 2024-05-13 DIAGNOSIS — M5459 Other low back pain: Secondary | ICD-10-CM | POA: Diagnosis present
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
## 2024-05-17 LAB — SURGICAL PATHOLOGY

## 2024-05-18 ENCOUNTER — Ambulatory Visit: Payer: Self-pay | Admitting: Internal Medicine

## 2024-05-19 ENCOUNTER — Encounter: Payer: Self-pay | Admitting: "Endocrinology

## 2024-05-19 ENCOUNTER — Ambulatory Visit (INDEPENDENT_AMBULATORY_CARE_PROVIDER_SITE_OTHER): Admitting: "Endocrinology

## 2024-05-19 VITALS — BP 140/80 | HR 85 | Ht 70.0 in | Wt 272.0 lb

## 2024-05-19 DIAGNOSIS — Z7984 Long term (current) use of oral hypoglycemic drugs: Secondary | ICD-10-CM | POA: Diagnosis not present

## 2024-05-19 DIAGNOSIS — K912 Postsurgical malabsorption, not elsewhere classified: Secondary | ICD-10-CM | POA: Diagnosis not present

## 2024-05-19 LAB — POCT GLYCOSYLATED HEMOGLOBIN (HGB A1C): Hemoglobin A1C: 5.8 % — AB (ref 4.0–5.6)

## 2024-05-19 MED ORDER — ACARBOSE 25 MG PO TABS: 25.0000 mg | ORAL_TABLET | Freq: Three times a day (TID) | ORAL | 1 refills | Status: DC
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
## 2024-05-24 ENCOUNTER — Ambulatory Visit (INDEPENDENT_AMBULATORY_CARE_PROVIDER_SITE_OTHER): Admitting: Podiatry

## 2024-05-24 ENCOUNTER — Encounter: Payer: Self-pay | Admitting: Podiatry

## 2024-05-24 DIAGNOSIS — L84 Corns and callosities: Secondary | ICD-10-CM

## 2024-05-24 DIAGNOSIS — B351 Tinea unguium: Secondary | ICD-10-CM

## 2024-05-24 DIAGNOSIS — M79674 Pain in right toe(s): Secondary | ICD-10-CM

## 2024-05-24 DIAGNOSIS — E0840 Diabetes mellitus due to underlying condition with diabetic neuropathy, unspecified: Secondary | ICD-10-CM | POA: Diagnosis not present
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
## 2024-05-28 ENCOUNTER — Encounter (HOSPITAL_BASED_OUTPATIENT_CLINIC_OR_DEPARTMENT_OTHER): Payer: Self-pay | Admitting: Physical Therapy

## 2024-05-28 ENCOUNTER — Ambulatory Visit (HOSPITAL_BASED_OUTPATIENT_CLINIC_OR_DEPARTMENT_OTHER): Payer: Self-pay | Admitting: Physical Therapy

## 2024-05-28 DIAGNOSIS — M5459 Other low back pain: Secondary | ICD-10-CM

## 2024-05-28 DIAGNOSIS — M6281 Muscle weakness (generalized): Secondary | ICD-10-CM
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
## 2024-05-31 ENCOUNTER — Encounter (HOSPITAL_BASED_OUTPATIENT_CLINIC_OR_DEPARTMENT_OTHER): Payer: Self-pay | Admitting: Physical Therapy

## 2024-05-31 ENCOUNTER — Ambulatory Visit (HOSPITAL_BASED_OUTPATIENT_CLINIC_OR_DEPARTMENT_OTHER): Payer: Self-pay | Admitting: Physical Therapy

## 2024-05-31 DIAGNOSIS — M5459 Other low back pain: Secondary | ICD-10-CM

## 2024-05-31 DIAGNOSIS — M6281 Muscle weakness (generalized): Secondary | ICD-10-CM
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
## 2024-06-09 ENCOUNTER — Encounter (HOSPITAL_BASED_OUTPATIENT_CLINIC_OR_DEPARTMENT_OTHER): Payer: Self-pay | Admitting: Physical Therapy

## 2024-06-09 ENCOUNTER — Ambulatory Visit (HOSPITAL_BASED_OUTPATIENT_CLINIC_OR_DEPARTMENT_OTHER): Admitting: Physical Therapy

## 2024-06-09 DIAGNOSIS — M6281 Muscle weakness (generalized): Secondary | ICD-10-CM

## 2024-06-09 DIAGNOSIS — M5459 Other low back pain: Secondary | ICD-10-CM | POA: Diagnosis not present
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
## 2024-06-11 ENCOUNTER — Ambulatory Visit (HOSPITAL_BASED_OUTPATIENT_CLINIC_OR_DEPARTMENT_OTHER): Admitting: Physical Therapy

## 2024-06-16 ENCOUNTER — Ambulatory Visit: Admitting: Podiatry

## 2024-06-16 ENCOUNTER — Encounter: Payer: Self-pay | Admitting: Podiatry

## 2024-06-16 ENCOUNTER — Ambulatory Visit (INDEPENDENT_AMBULATORY_CARE_PROVIDER_SITE_OTHER)

## 2024-06-16 ENCOUNTER — Encounter (HOSPITAL_BASED_OUTPATIENT_CLINIC_OR_DEPARTMENT_OTHER): Payer: Self-pay | Admitting: Physical Therapy

## 2024-06-16 ENCOUNTER — Ambulatory Visit (HOSPITAL_BASED_OUTPATIENT_CLINIC_OR_DEPARTMENT_OTHER): Attending: Family Medicine | Admitting: Physical Therapy

## 2024-06-16 VITALS — BP 123/69 | HR 94 | Temp 98.8°F

## 2024-06-16 DIAGNOSIS — E08621 Diabetes mellitus due to underlying condition with foot ulcer: Secondary | ICD-10-CM

## 2024-06-16 DIAGNOSIS — M5459 Other low back pain: Secondary | ICD-10-CM | POA: Insufficient documentation

## 2024-06-16 DIAGNOSIS — M6281 Muscle weakness (generalized): Secondary | ICD-10-CM | POA: Diagnosis present

## 2024-06-16 DIAGNOSIS — L97512 Non-pressure chronic ulcer of other part of right foot with fat layer exposed: Secondary | ICD-10-CM

## 2024-06-16 MED ORDER — DOXYCYCLINE HYCLATE 100 MG PO TABS: 100.0000 mg | ORAL_TABLET | Freq: Two times a day (BID) | ORAL | 0 refills | Status: AC
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
## 2024-06-18 ENCOUNTER — Ambulatory Visit (HOSPITAL_BASED_OUTPATIENT_CLINIC_OR_DEPARTMENT_OTHER): Admitting: Physical Therapy

## 2024-06-22 ENCOUNTER — Ambulatory Visit: Admitting: Podiatry

## 2024-06-23 ENCOUNTER — Ambulatory Visit (HOSPITAL_BASED_OUTPATIENT_CLINIC_OR_DEPARTMENT_OTHER): Admitting: Physical Therapy

## 2024-06-23 ENCOUNTER — Encounter (HOSPITAL_BASED_OUTPATIENT_CLINIC_OR_DEPARTMENT_OTHER): Payer: Self-pay | Admitting: Physical Therapy

## 2024-06-23 DIAGNOSIS — M6281 Muscle weakness (generalized): Secondary | ICD-10-CM

## 2024-06-23 DIAGNOSIS — M5459 Other low back pain: Secondary | ICD-10-CM
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
## 2024-06-25 ENCOUNTER — Ambulatory Visit (HOSPITAL_BASED_OUTPATIENT_CLINIC_OR_DEPARTMENT_OTHER): Admitting: Physical Therapy

## 2024-06-28 ENCOUNTER — Encounter: Payer: Self-pay | Admitting: Podiatry

## 2024-06-28 ENCOUNTER — Other Ambulatory Visit: Payer: Self-pay

## 2024-06-28 ENCOUNTER — Ambulatory Visit (INDEPENDENT_AMBULATORY_CARE_PROVIDER_SITE_OTHER): Admitting: Podiatry

## 2024-06-28 ENCOUNTER — Inpatient Hospital Stay (HOSPITAL_COMMUNITY)
Admission: EM | Admit: 2024-06-28 | Discharge: 2024-07-01 | DRG: 617 | Disposition: A | Discharge status: Home / Self Care | Source: Ambulatory Visit | Attending: Internal Medicine | Admitting: Internal Medicine

## 2024-06-28 ENCOUNTER — Emergency Department (HOSPITAL_COMMUNITY)

## 2024-06-28 ENCOUNTER — Encounter (HOSPITAL_COMMUNITY): Payer: Self-pay

## 2024-06-28 VITALS — BP 156/88 | HR 80 | Temp 99.0°F

## 2024-06-28 DIAGNOSIS — Z833 Family history of diabetes mellitus: Secondary | ICD-10-CM

## 2024-06-28 DIAGNOSIS — E08621 Diabetes mellitus due to underlying condition with foot ulcer: Secondary | ICD-10-CM

## 2024-06-28 DIAGNOSIS — M869 Osteomyelitis, unspecified: Secondary | ICD-10-CM | POA: Diagnosis not present

## 2024-06-28 DIAGNOSIS — H538 Other visual disturbances: Secondary | ICD-10-CM | POA: Diagnosis present

## 2024-06-28 DIAGNOSIS — N184 Chronic kidney disease, stage 4 (severe): Secondary | ICD-10-CM | POA: Diagnosis present

## 2024-06-28 DIAGNOSIS — N189 Chronic kidney disease, unspecified: Secondary | ICD-10-CM

## 2024-06-28 DIAGNOSIS — Z6838 Body mass index (BMI) 38.0-38.9, adult: Secondary | ICD-10-CM

## 2024-06-28 DIAGNOSIS — E1140 Type 2 diabetes mellitus with diabetic neuropathy, unspecified: Principal | ICD-10-CM | POA: Diagnosis present

## 2024-06-28 DIAGNOSIS — Z89512 Acquired absence of left leg below knee: Secondary | ICD-10-CM

## 2024-06-28 DIAGNOSIS — K219 Gastro-esophageal reflux disease without esophagitis: Secondary | ICD-10-CM | POA: Diagnosis present

## 2024-06-28 DIAGNOSIS — Z9151 Personal history of suicidal behavior: Secondary | ICD-10-CM

## 2024-06-28 DIAGNOSIS — Z87891 Personal history of nicotine dependence: Secondary | ICD-10-CM

## 2024-06-28 DIAGNOSIS — F419 Anxiety disorder, unspecified: Secondary | ICD-10-CM | POA: Diagnosis present

## 2024-06-28 DIAGNOSIS — Z794 Long term (current) use of insulin: Secondary | ICD-10-CM

## 2024-06-28 DIAGNOSIS — Z6835 Body mass index (BMI) 35.0-35.9, adult: Secondary | ICD-10-CM

## 2024-06-28 DIAGNOSIS — L97514 Non-pressure chronic ulcer of other part of right foot with necrosis of bone: Secondary | ICD-10-CM | POA: Diagnosis not present

## 2024-06-28 DIAGNOSIS — E875 Hyperkalemia: Secondary | ICD-10-CM | POA: Diagnosis present

## 2024-06-28 DIAGNOSIS — F32A Depression, unspecified: Secondary | ICD-10-CM | POA: Diagnosis present

## 2024-06-28 DIAGNOSIS — M009 Pyogenic arthritis, unspecified: Secondary | ICD-10-CM | POA: Diagnosis present

## 2024-06-28 DIAGNOSIS — E1122 Type 2 diabetes mellitus with diabetic chronic kidney disease: Secondary | ICD-10-CM | POA: Diagnosis present

## 2024-06-28 DIAGNOSIS — L03211 Cellulitis of face: Secondary | ICD-10-CM | POA: Diagnosis present

## 2024-06-28 DIAGNOSIS — E66812 Obesity, class 2: Secondary | ICD-10-CM | POA: Diagnosis present

## 2024-06-28 DIAGNOSIS — D631 Anemia in chronic kidney disease: Secondary | ICD-10-CM | POA: Diagnosis present

## 2024-06-28 DIAGNOSIS — Z7984 Long term (current) use of oral hypoglycemic drugs: Secondary | ICD-10-CM

## 2024-06-28 DIAGNOSIS — Z7901 Long term (current) use of anticoagulants: Secondary | ICD-10-CM

## 2024-06-28 DIAGNOSIS — Z9884 Bariatric surgery status: Secondary | ICD-10-CM

## 2024-06-28 DIAGNOSIS — Z823 Family history of stroke: Secondary | ICD-10-CM

## 2024-06-28 DIAGNOSIS — I129 Hypertensive chronic kidney disease with stage 1 through stage 4 chronic kidney disease, or unspecified chronic kidney disease: Secondary | ICD-10-CM | POA: Diagnosis present

## 2024-06-28 DIAGNOSIS — E1169 Type 2 diabetes mellitus with other specified complication: Secondary | ICD-10-CM | POA: Diagnosis not present

## 2024-06-28 DIAGNOSIS — N179 Acute kidney failure, unspecified: Secondary | ICD-10-CM | POA: Diagnosis present

## 2024-06-28 DIAGNOSIS — Z79899 Other long term (current) drug therapy: Secondary | ICD-10-CM

## 2024-06-28 DIAGNOSIS — E785 Hyperlipidemia, unspecified: Secondary | ICD-10-CM | POA: Diagnosis present

## 2024-06-28 DIAGNOSIS — Z888 Allergy status to other drugs, medicaments and biological substances status: Secondary | ICD-10-CM

## 2024-06-28 DIAGNOSIS — Z8249 Family history of ischemic heart disease and other diseases of the circulatory system: Secondary | ICD-10-CM

## 2024-06-28 DIAGNOSIS — Z885 Allergy status to narcotic agent status: Secondary | ICD-10-CM

## 2024-06-28 LAB — COMPREHENSIVE METABOLIC PANEL WITH GFR
ALT: 23 U/L (ref 0–44)
AST: 22 U/L (ref 15–41)
Albumin: 3.2 g/dL — ABNORMAL LOW (ref 3.5–5.0)
Alkaline Phosphatase: 114 U/L (ref 38–126)
Anion gap: 8 (ref 5–15)
BUN: 33 mg/dL — ABNORMAL HIGH (ref 6–20)
CO2: 17 mmol/L — ABNORMAL LOW (ref 22–32)
Calcium: 8.2 mg/dL — ABNORMAL LOW (ref 8.9–10.3)
Chloride: 115 mmol/L — ABNORMAL HIGH (ref 98–111)
Creatinine, Ser: 4.06 mg/dL — ABNORMAL HIGH (ref 0.61–1.24)
GFR, Estimated: 17 mL/min — ABNORMAL LOW (ref >60)
Glucose, Bld: 152 mg/dL — ABNORMAL HIGH (ref 70–99)
Potassium: 5 mmol/L (ref 3.5–5.1)
Sodium: 140 mmol/L (ref 135–145)
Total Bilirubin: 0.4 mg/dL (ref 0.0–1.2)
Total Protein: 6.6 g/dL (ref 6.5–8.1)

## 2024-06-28 LAB — CBC WITH DIFFERENTIAL/PLATELET
Abs Immature Granulocytes: 0.01 K/uL (ref 0.00–0.07)
Basophils Absolute: 0 K/uL (ref 0.0–0.1)
Basophils Relative: 0 %
Eosinophils Absolute: 0.1 K/uL (ref 0.0–0.5)
Eosinophils Relative: 2 %
HCT: 37.1 % — ABNORMAL LOW (ref 39.0–52.0)
Hemoglobin: 12.1 g/dL — ABNORMAL LOW (ref 13.0–17.0)
Immature Granulocytes: 0 %
Lymphocytes Relative: 21 %
Lymphs Abs: 1 K/uL (ref 0.7–4.0)
MCH: 31.6 pg (ref 26.0–34.0)
MCHC: 32.6 g/dL (ref 30.0–36.0)
MCV: 96.9 fL (ref 80.0–100.0)
Monocytes Absolute: 0.4 K/uL (ref 0.1–1.0)
Monocytes Relative: 9 %
Neutro Abs: 3.3 K/uL (ref 1.7–7.7)
Neutrophils Relative %: 68 %
Platelets: 221 K/uL (ref 150–400)
RBC: 3.83 MIL/uL — ABNORMAL LOW (ref 4.22–5.81)
RDW: 13.7 % (ref 11.5–15.5)
WBC: 4.8 K/uL (ref 4.0–10.5)
nRBC: 0 % (ref 0.0–0.2)

## 2024-06-28 LAB — C-REACTIVE PROTEIN: CRP: 1.2 mg/dL — ABNORMAL HIGH (ref <1.0)

## 2024-06-28 LAB — SEDIMENTATION RATE: Sed Rate: 31 mm/h — ABNORMAL HIGH (ref 0–16)

## 2024-06-28 LAB — I-STAT CG4 LACTIC ACID, ED: Lactic Acid, Venous: 1 mmol/L (ref 0.5–1.9)

## 2024-06-28 MED ORDER — AMLODIPINE BESYLATE 10 MG PO TABS: 10.0000 mg | ORAL_TABLET | Freq: Every day | ORAL | Status: DC

## 2024-06-28 MED ORDER — PANTOPRAZOLE SODIUM 40 MG PO TBEC
40.0000 mg | DELAYED_RELEASE_TABLET | Freq: Every day | ORAL | Status: DC
  Administered 2024-06-29 – 2024-07-01 (×3): 40 mg via ORAL
  Filled 2024-06-28 (×3): qty 1

## 2024-06-28 MED ORDER — VANCOMYCIN HCL 1750 MG/350ML IV SOLN
1750.0000 mg | Freq: Once | INTRAVENOUS | Status: DC
  Filled 2024-06-28: qty 350

## 2024-06-28 MED ORDER — ROSUVASTATIN CALCIUM 5 MG PO TABS
5.0000 mg | ORAL_TABLET | Freq: Every day | ORAL | Status: DC
Start: 2024-06-28 — End: 2024-07-02
  Administered 2024-06-29 – 2024-07-01 (×3): 5 mg via ORAL
  Filled 2024-06-28 (×3): qty 1

## 2024-06-28 MED ORDER — LINACLOTIDE 145 MCG PO CAPS: 145.0000 ug | ORAL_CAPSULE | Freq: Every day | ORAL | Status: DC

## 2024-06-28 MED ORDER — SODIUM CHLORIDE 0.9 % IV SOLN
2.0000 g | INTRAVENOUS | Status: DC
  Administered 2024-06-29 – 2024-07-01 (×3): 2 g via INTRAVENOUS
  Filled 2024-06-28 (×3): qty 12.5

## 2024-06-28 MED ORDER — OLMESARTAN-AMLODIPINE-HCTZ 40-10-25 MG PO TABS: 1.0000 | ORAL_TABLET | Freq: Every day | ORAL | Status: DC

## 2024-06-28 MED ORDER — HYDRALAZINE HCL 50 MG PO TABS
100.0000 mg | ORAL_TABLET | Freq: Three times a day (TID) | ORAL | Status: DC
  Administered 2024-06-28: 100 mg via ORAL
  Filled 2024-06-28: qty 2

## 2024-06-28 MED ORDER — VANCOMYCIN HCL IN DEXTROSE 1-5 GM/200ML-% IV SOLN: 1000.0000 mg | Freq: Once | INTRAVENOUS | Status: DC

## 2024-06-28 MED ORDER — OLMESARTAN MEDOXOMIL 40 MG PO TABS
40.0000 mg | ORAL_TABLET | Freq: Every day | ORAL | Status: DC
  Filled 2024-06-28: qty 1

## 2024-06-28 MED ORDER — LINEZOLID 600 MG/300ML IV SOLN
600.0000 mg | Freq: Two times a day (BID) | INTRAVENOUS | Status: DC
  Administered 2024-06-28 – 2024-07-01 (×6): 600 mg via INTRAVENOUS
  Filled 2024-06-28 (×8): qty 300

## 2024-06-28 MED ORDER — SODIUM CHLORIDE 0.9 % IV SOLN
2.0000 g | Freq: Once | INTRAVENOUS | Status: AC
  Administered 2024-06-28: 2 g via INTRAVENOUS
  Filled 2024-06-28: qty 12.5

## 2024-06-28 MED ORDER — METOPROLOL TARTRATE 50 MG PO TABS
100.0000 mg | ORAL_TABLET | Freq: Two times a day (BID) | ORAL | Status: DC
  Administered 2024-06-28 – 2024-07-01 (×6): 100 mg via ORAL
  Filled 2024-06-28 (×4): qty 2
  Filled 2024-06-28: qty 4
  Filled 2024-06-28: qty 2

## 2024-06-28 MED ORDER — SODIUM CHLORIDE 0.9 % IV BOLUS
1000.0000 mL | Freq: Once | INTRAVENOUS | Status: AC
  Administered 2024-06-28: 1000 mL via INTRAVENOUS

## 2024-06-28 MED ORDER — HYDROCHLOROTHIAZIDE 25 MG PO TABS: 25.0000 mg | ORAL_TABLET | Freq: Every day | ORAL | Status: DC

## 2024-06-28 MED ORDER — HYDROCODONE-ACETAMINOPHEN 10-325 MG PO TABS
1.0000 | ORAL_TABLET | Freq: Four times a day (QID) | ORAL | Status: DC | PRN
  Administered 2024-06-28 – 2024-06-29 (×2): 1 via ORAL
  Filled 2024-06-28 (×2): qty 1

## 2024-06-28 NOTE — ED Notes (Signed)
 Patient transported to X-ray

## 2024-06-28 NOTE — ED Triage Notes (Signed)
 Patient was sent to the ED by his podiatrist for a possible infection on his 2nd toe on the right foot. Patient states that it is to the bone. Patient states that the wound has been there for 2 weeks and despite antibiotics it has gotten worse.   PMH: diabetic, HTN, kidney disease.

## 2024-06-28 NOTE — ED Notes (Signed)
 Shift report received from [facility], assumed care of patient at this time.

## 2024-06-28 NOTE — ED Notes (Signed)
 Pt to MRI with transport. Unable to start IVF and antibiotics d/t MRI.
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
## 2024-06-28 NOTE — ED Notes (Signed)
 Requested pharmacy to review patients ordered medication so that night medications can be administered.
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
## 2024-06-28 NOTE — Progress Notes (Signed)
 Pharmacy Antibiotic Note  Dwayne Rocha is a 47 y.o. male admitted on 06/28/2024 with osteomyelitis.  Pharmacy has been consulted for Vancomycin  and Cefepime  dosing.  Plan: Spoke with Dr. Georgina regarding switching to Linezolid  instead of doing Vancomycin , Dr. Georgina agreed to Linezolid , so will continue with Linezolid  600 BID FEP 2 g q24 per CrCl of 29.8 mL/min Continue to monitor Bcx Continue to monitor renal function  Height: 5' 10 (177.8 cm) Weight: 122.5 kg (270 lb) IBW/kg (Calculated) : 73  Temp (24hrs), Avg:98.8 F (37.1 C), Min:98.6 F (37 C), Max:99 F (37.2 C)  Recent Labs  Lab 06/28/24 1534 06/28/24 1555  WBC 4.8  --   CREATININE 4.06*  --   LATICACIDVEN  --  1.0    Estimated Creatinine Clearance: 29.8 mL/min (A) (by C-G formula based on SCr of 4.06 mg/dL (H)).    Allergies  Allergen Reactions   Lyrica [Pregabalin] Swelling   Oxycodone  Nausea Only    Antimicrobials this admission: FEP 2 g q24 >>  Linezolid  600 BID >>   Microbiology results: 08/18 BCx: in process  Thank you for allowing pharmacy to be a part of this patient's care.  R. Samual Satterfield, PharmD PGY-1 Acute Care Pharmacy Resident [facility] 06/28/2024 5:44 PM

## 2024-06-28 NOTE — ED Provider Notes (Addendum)
 [place] EMERGENCY DEPARTMENT AT [facility] Provider Note   CSN: [id number] Arrival date & time: 06/28/24  1526     Patient presents with: Foot Injury (/)   Dwayne Rocha is a 48 y.o. male w/ hx of diabetes, left lower extremity amputation, neuropathy, here from podiatry clinic with concern for infected vs ischemic right 2nd toe.  Pt has had right toe amputation in the past.  He states he's had a 2nd toe wound for about 3 weeks but it rapidly progressed and darkened over 7 days.  He's been on antibiotics outpatient.  Podiatric clinic note reviewed from today - concern for osteomyelitis and likely need for amputation of toe.  Pt to be started on BS antibiotics and MRI obtained.  Patient denies fevers, chills.   HPI     Prior to Admission medications   Medication Sig Start Date End Date Taking? Authorizing Provider  acarbose  (PRECOSE ) 25 MG tablet Take 1 tablet (25 mg total) by mouth 3 (three) times daily with meals. 05/19/24   Motwani, Komal, MD  Acetaminophen  (TYLENOL ) 325 MG CAPS Take 1 capsule by mouth every 8 (eight) hours as needed.    [provider]  amLODipine -olmesartan  (AZOR) 10-40 MG tablet Take 1 tablet by mouth daily. 04/16/24   [provider]  Cholecalciferol (VITAMIN D ) 50 MCG (2000 UT) tablet Take 2,000 Units by mouth daily.    [provider]  Continuous Blood Gluc Sensor (FREESTYLE LIBRE 14 DAY SENSOR) MISC Apply topically 3 (three) times daily. 04/24/20   [provider]  Continuous Glucose Receiver (FREESTYLE LIBRE 2 READER) DEVI Inject 1 Dose into the skin as directed.    [provider]  Continuous Glucose Sensor (FREESTYLE LIBRE 2 SENSOR) MISC Inject 1 Dose into the skin as directed.    [provider]  desvenlafaxine (PRISTIQ) 50 MG 24 hr tablet Take 50 mg by mouth every morning. 03/27/24   [provider]  empagliflozin (JARDIANCE) 25 MG TABS tablet Take 25 mg by mouth daily.     [provider]  Glucagon  (GVOKE HYPOPEN  1-PACK) 1 MG/0.2ML SOAJ Inject 1 mg into the skin as needed (low blood sugar with imaopired consciousness). 05/13/23   Motwani, Komal, MD  Glucose Blood (BLOOD GLUCOSE TEST STRIPS) STRP 1 each by In Vitro route in the morning, at noon, and at bedtime. May substitute to any manufacturer covered by patient's insurance. 02/18/24 06/30/24  Motwani, Komal, MD  hydrALAZINE  (APRESOLINE ) 100 MG tablet Take 1 tablet (100 mg total) by mouth every 8 (eight) hours. 02/27/21   Antoinette Doe, MD  hydrochlorothiazide  (HYDRODIURIL ) 25 MG tablet Take 25 mg by mouth daily. 02/04/20   [provider]  HYDROcodone -acetaminophen  (NORCO) 10-325 MG tablet Take 1 tablet by mouth every 6 (six) hours as needed for moderate pain (pain score 4-6).    [provider]  linaclotide  (LINZESS ) 145 MCG CAPS capsule Take 1 capsule (145 mcg total) by mouth daily before breakfast. 01/21/24   Federico Rosario BROCKS, MD  metoprolol  tartrate (LOPRESSOR ) 100 MG tablet Take 100 mg by mouth 2 (two) times daily.    [provider]  metoprolol  tartrate (LOPRESSOR ) 50 MG tablet Take 50 mg by mouth daily.    [provider]  mupirocin  ointment (BACTROBAN ) 2 % Apply 1 Application topically daily. 01/20/24   Standiford, Marsa FALCON, DPM  NOREL AD 4-10-325 MG TABS Take 1 tablet by mouth 2 (two) times daily. 12/03/23   [provider]  Olmesartan -amLODIPine -HCTZ 40-10-25 MG  TABS Take 1 tablet by mouth daily.    [provider]  Oxycodone  HCl 10 MG TABS Take 1 mg by mouth every 8 (eight) hours as needed. Patient not taking: Reported on 06/28/2024 11/19/23   [provider]  pantoprazole  (PROTONIX ) 40 MG tablet Take 1 tablet (40 mg total) by mouth daily. 05/11/24   Federico Rosario BROCKS, MD  rosuvastatin  (CRESTOR ) 5 MG tablet TAKE 1 TABLET(5 MG) BY MOUTH DAILY 11/14/23   Motwani, Komal, MD  sodium zirconium cyclosilicate (LOKELMA) 10 g PACK packet Take 10 g by mouth 3  (three) times a week.    [provider]  sucralfate  (CARAFATE ) 1 GM/10ML suspension Take 10 mLs (1 g total) by mouth 4 (four) times daily for 28 days. 03/11/24 06/28/24  Federico Rosario BROCKS, MD  SUNOSI  75 MG TABS Take 1 tablet by mouth every morning.    [provider]  rivaroxaban  (XARELTO ) 10 MG TABS tablet Take 1 tablet (10 mg total) by mouth daily. Patient not taking: No sig reported 02/16/21 04/19/21  Aniceto Eva Grebe, PA-C    Allergies: Lyrica [pregabalin] and Oxycodone     Review of Systems  Updated Vital Signs BP (!) 162/74   Pulse 93   Temp 98.6 F (37 C)   Resp 14   Ht 5' 10 (1.778 m)   Wt 122.5 kg   SpO2 98%   BMI 38.74 kg/m   Physical Exam Constitutional:      General: He is not in acute distress. HENT:     Head: Normocephalic and atraumatic.  Eyes:     Conjunctiva/sclera: Conjunctivae normal.     Pupils: Pupils are equal, round, and reactive to light.  Cardiovascular:     Rate and Rhythm: Normal rate and regular rhythm.  Pulmonary:     Effort: Pulmonary effort is normal. No respiratory distress.  Abdominal:     General: There is no distension.     Tenderness: There is no abdominal tenderness.  Musculoskeletal:     Comments: Left lower extremity partial amputation Right great toe amputation Right 2nd toe dusky, black, ulcerated, no erythema of the foot noted  Skin:    General: Skin is warm and dry.  Neurological:     General: No focal deficit present.     Mental Status: He is alert. Mental status is at baseline.  Psychiatric:        Mood and Affect: Mood normal.        Behavior: Behavior normal.     (all labs ordered are listed, but only abnormal results are displayed) Labs Reviewed  COMPREHENSIVE METABOLIC PANEL WITH GFR - Abnormal; Notable for the following components:      Result Value   Chloride 115 (*)    CO2 17 (*)    Glucose, Bld 152 (*)    BUN 33 (*)    Creatinine, Ser 4.06 (*)    Calcium  8.2 (*)    Albumin 3.2 (*)    GFR,  Estimated 17 (*)    All other components within normal limits  CBC WITH DIFFERENTIAL/PLATELET - Abnormal; Notable for the following components:   RBC 3.83 (*)    Hemoglobin 12.1 (*)    HCT 37.1 (*)    All other components within normal limits  CULTURE, BLOOD (ROUTINE X 2)  CULTURE, BLOOD (ROUTINE X 2)  I-STAT CG4 LACTIC ACID, ED    EKG: None  Radiology: DG Toe 2nd Right Result Date: 06/28/2024 CLINICAL DATA:  Diabetes, second digit infection EXAM:  RIGHT SECOND TOE COMPARISON:  06/16/2024 FINDINGS: Frontal, oblique, and lateral views of the right second digit are obtained. Diffuse soft tissue swelling of the second digit is again noted, with persistent erosive changes of the distal tuft of the second distal phalanx compatible with osteomyelitis. There is now erosive change surrounding the second distal interphalangeal joint and distal margin of the second middle phalanx, concerning for septic arthritis and progressive osteomyelitis. No subcutaneous gas or radiopaque foreign body. Prior first digit amputation at the level of the first proximal phalanx. IMPRESSION: 1. Erosive changes within the second middle and distal phalanges as above, consistent with progressive osteomyelitis and suspected septic arthritis of the second distal interphalangeal joint. 2. Marked soft tissue swelling throughout the second digit. No subcutaneous gas or radiopaque foreign body. Electronically Signed   By: Ozell Daring M.D.   On: 06/28/2024 16:12     Procedures   Medications Ordered in the ED  ceFEPIme  (MAXIPIME ) 2 g in sodium chloride  0.9 % 100 mL IVPB (2 g Intravenous New Bag/Given 06/28/24 1646)  vancomycin  (VANCOREADY) IVPB 1750 mg/350 mL (has no administration in time range)  sodium chloride  0.9 % bolus 1,000 mL (has no administration in time range)    Clinical Course as of 06/28/24 1715  Mon Jun 28, 2024  1646 IV fluids ordered for elevated BUN/Cr.  Pt has CKD. [MT]  1715 Admitted to hospitalist. Dr  Malvin podiatry notified and aware of patient admission  Possible OR tomorrow but schedule is not certain. [MT]    Clinical Course User Index [MT] Ramir Malerba, Donnice PARAS, MD                                 Medical Decision Making Amount and/or Complexity of Data Reviewed Labs: ordered. Radiology: ordered.  Risk Prescription drug management. Decision regarding hospitalization.   Pt here with concern for infected and/or necrosis of the right 2nd toe  No systemic symptoms of infection  Xrays and labs reviewed - concerning for osteomyelitis.  We'll start on BS antibiotics and admit to the hospital.  MRI ordered per podiatry recommendations.   External records reviewed, podiatry note from the office today  Comorbidity include diabetes, neuropathy, at higher risk of infection  No evidence of sepsis at this time.  Mild AKI on known CKD - IV fluids ordered.     Final diagnoses:  Osteomyelitis of second toe of right foot (HCC)  Chronic kidney disease, unspecified CKD stage    ED Discharge Orders     None          Rashanna Christiana, Donnice PARAS, MD 06/28/24 1647    Cottie Donnice PARAS, MD 06/28/24 [phone]

## 2024-06-29 DIAGNOSIS — Z89512 Acquired absence of left leg below knee: Secondary | ICD-10-CM | POA: Diagnosis not present

## 2024-06-29 DIAGNOSIS — E875 Hyperkalemia: Secondary | ICD-10-CM | POA: Diagnosis present

## 2024-06-29 DIAGNOSIS — Z6835 Body mass index (BMI) 35.0-35.9, adult: Secondary | ICD-10-CM | POA: Diagnosis not present

## 2024-06-29 DIAGNOSIS — I10 Essential (primary) hypertension: Secondary | ICD-10-CM | POA: Diagnosis not present

## 2024-06-29 DIAGNOSIS — Z8249 Family history of ischemic heart disease and other diseases of the circulatory system: Secondary | ICD-10-CM | POA: Diagnosis not present

## 2024-06-29 DIAGNOSIS — N184 Chronic kidney disease, stage 4 (severe): Secondary | ICD-10-CM | POA: Diagnosis present

## 2024-06-29 DIAGNOSIS — M869 Osteomyelitis, unspecified: Secondary | ICD-10-CM | POA: Diagnosis present

## 2024-06-29 DIAGNOSIS — M009 Pyogenic arthritis, unspecified: Secondary | ICD-10-CM | POA: Diagnosis present

## 2024-06-29 DIAGNOSIS — E66812 Obesity, class 2: Secondary | ICD-10-CM | POA: Diagnosis present

## 2024-06-29 DIAGNOSIS — E1169 Type 2 diabetes mellitus with other specified complication: Secondary | ICD-10-CM | POA: Diagnosis present

## 2024-06-29 DIAGNOSIS — I129 Hypertensive chronic kidney disease with stage 1 through stage 4 chronic kidney disease, or unspecified chronic kidney disease: Secondary | ICD-10-CM | POA: Diagnosis present

## 2024-06-29 DIAGNOSIS — D631 Anemia in chronic kidney disease: Secondary | ICD-10-CM | POA: Diagnosis present

## 2024-06-29 DIAGNOSIS — N179 Acute kidney failure, unspecified: Secondary | ICD-10-CM | POA: Diagnosis present

## 2024-06-29 DIAGNOSIS — Z87891 Personal history of nicotine dependence: Secondary | ICD-10-CM | POA: Diagnosis not present

## 2024-06-29 DIAGNOSIS — F32A Depression, unspecified: Secondary | ICD-10-CM | POA: Diagnosis present

## 2024-06-29 DIAGNOSIS — E1140 Type 2 diabetes mellitus with diabetic neuropathy, unspecified: Secondary | ICD-10-CM | POA: Diagnosis present

## 2024-06-29 DIAGNOSIS — L03211 Cellulitis of face: Secondary | ICD-10-CM | POA: Diagnosis present

## 2024-06-29 DIAGNOSIS — M86171 Other acute osteomyelitis, right ankle and foot: Secondary | ICD-10-CM | POA: Diagnosis not present

## 2024-06-29 DIAGNOSIS — E1122 Type 2 diabetes mellitus with diabetic chronic kidney disease: Secondary | ICD-10-CM | POA: Diagnosis present

## 2024-06-29 DIAGNOSIS — Z7984 Long term (current) use of oral hypoglycemic drugs: Secondary | ICD-10-CM | POA: Diagnosis not present

## 2024-06-29 DIAGNOSIS — Z794 Long term (current) use of insulin: Secondary | ICD-10-CM | POA: Diagnosis not present

## 2024-06-29 DIAGNOSIS — Z833 Family history of diabetes mellitus: Secondary | ICD-10-CM | POA: Diagnosis not present

## 2024-06-29 DIAGNOSIS — E785 Hyperlipidemia, unspecified: Secondary | ICD-10-CM | POA: Diagnosis present

## 2024-06-29 DIAGNOSIS — Z9884 Bariatric surgery status: Secondary | ICD-10-CM | POA: Diagnosis not present

## 2024-06-29 DIAGNOSIS — Z6838 Body mass index (BMI) 38.0-38.9, adult: Secondary | ICD-10-CM | POA: Diagnosis not present

## 2024-06-29 DIAGNOSIS — N189 Chronic kidney disease, unspecified: Secondary | ICD-10-CM | POA: Diagnosis not present

## 2024-06-29 DIAGNOSIS — Z7901 Long term (current) use of anticoagulants: Secondary | ICD-10-CM | POA: Diagnosis not present

## 2024-06-29 LAB — CBC
HCT: 33.8 % — ABNORMAL LOW (ref 39.0–52.0)
Hemoglobin: 11 g/dL — ABNORMAL LOW (ref 13.0–17.0)
MCH: 31.1 pg (ref 26.0–34.0)
MCHC: 32.5 g/dL (ref 30.0–36.0)
MCV: 95.5 fL (ref 80.0–100.0)
Platelets: 183 K/uL (ref 150–400)
RBC: 3.54 MIL/uL — ABNORMAL LOW (ref 4.22–5.81)
RDW: 13.7 % (ref 11.5–15.5)
WBC: 4.2 K/uL (ref 4.0–10.5)
nRBC: 0 % (ref 0.0–0.2)

## 2024-06-29 LAB — BASIC METABOLIC PANEL WITH GFR
Anion gap: 4 — ABNORMAL LOW (ref 5–15)
BUN: 33 mg/dL — ABNORMAL HIGH (ref 6–20)
CO2: 18 mmol/L — ABNORMAL LOW (ref 22–32)
Calcium: 7.9 mg/dL — ABNORMAL LOW (ref 8.9–10.3)
Chloride: 117 mmol/L — ABNORMAL HIGH (ref 98–111)
Creatinine, Ser: 3.97 mg/dL — ABNORMAL HIGH (ref 0.61–1.24)
GFR, Estimated: 18 mL/min — ABNORMAL LOW (ref >60)
Glucose, Bld: 77 mg/dL (ref 70–99)
Potassium: 4.3 mmol/L (ref 3.5–5.1)
Sodium: 139 mmol/L (ref 135–145)

## 2024-06-29 LAB — PROTIME-INR
INR: 0.9 (ref 0.8–1.2)
Prothrombin Time: 13.1 s (ref 11.4–15.2)

## 2024-06-29 LAB — GLUCOSE, CAPILLARY
Glucose-Capillary: 101 mg/dL — ABNORMAL HIGH (ref 70–99)
Glucose-Capillary: 168 mg/dL — ABNORMAL HIGH (ref 70–99)
Glucose-Capillary: 149 mg/dL — ABNORMAL HIGH (ref 70–99)
Glucose-Capillary: 79 mg/dL (ref 70–99)

## 2024-06-29 LAB — PHOSPHORUS: Phosphorus: 4.8 mg/dL — ABNORMAL HIGH (ref 2.5–4.6)

## 2024-06-29 LAB — HIV ANTIBODY (ROUTINE TESTING W REFLEX): HIV Screen 4th Generation wRfx: NONREACTIVE

## 2024-06-29 LAB — MAGNESIUM: Magnesium: 1.9 mg/dL (ref 1.7–2.4)

## 2024-06-29 MED ORDER — SODIUM CHLORIDE 0.9% FLUSH
3.0000 mL | Freq: Two times a day (BID) | INTRAVENOUS | Status: DC
  Administered 2024-06-29 – 2024-07-01 (×5): 3 mL via INTRAVENOUS

## 2024-06-29 MED ORDER — INSULIN ASPART 100 UNIT/ML IJ SOLN
0.0000 [IU] | Freq: Three times a day (TID) | INTRAMUSCULAR | Status: DC
  Administered 2024-06-29 – 2024-07-01 (×2): 2 [IU] via SUBCUTANEOUS

## 2024-06-29 MED ORDER — SODIUM CHLORIDE 0.9 % IV SOLN: INTRAVENOUS | Status: AC

## 2024-06-29 MED ORDER — PATIROMER SORBITEX CALCIUM 8.4 G PO PACK
1.0000 | PACK | Freq: Every day | ORAL | Status: DC
  Administered 2024-06-29: 1 via ORAL
  Filled 2024-06-29: qty 1

## 2024-06-29 MED ORDER — HYDRALAZINE HCL 50 MG PO TABS
100.0000 mg | ORAL_TABLET | Freq: Two times a day (BID) | ORAL | Status: DC
  Administered 2024-06-29 – 2024-07-01 (×5): 100 mg via ORAL
  Filled 2024-06-29 (×5): qty 2

## 2024-06-29 MED ORDER — SOLRIAMFETOL HCL 75 MG PO TABS: 1.0000 | ORAL_TABLET | Freq: Every morning | ORAL | Status: DC

## 2024-06-29 MED ORDER — VENLAFAXINE HCL ER 75 MG PO CP24
75.0000 mg | ORAL_CAPSULE | Freq: Every day | ORAL | Status: DC
  Administered 2024-06-29 – 2024-07-01 (×3): 75 mg via ORAL
  Filled 2024-06-29 (×3): qty 1

## 2024-06-29 MED ORDER — ONDANSETRON HCL 4 MG/2ML IJ SOLN
4.0000 mg | Freq: Once | INTRAMUSCULAR | Status: AC
  Administered 2024-06-29: 4 mg via INTRAVENOUS
  Filled 2024-06-29: qty 2

## 2024-06-29 MED ORDER — MECLIZINE HCL 25 MG PO TABS
25.0000 mg | ORAL_TABLET | Freq: Once | ORAL | Status: AC
  Administered 2024-06-29: 25 mg via ORAL
  Filled 2024-06-29: qty 1
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
## 2024-06-29 NOTE — Progress Notes (Signed)
 RN went into the room because patient stated that he was feeling nauseous and dizzy. Angelito, Consulting civil engineer went  in the room with me we got patients Vital signs, as well as his blood sugar (168). Patient stated that we was just laying in the bed when he started feeling like this all of a sudden. RN Paged MD and notified him of patients symptoms also asked for Zofran  to help his nausea. MD stated he would come and see the patient and placed new orders as well. Fluids started, Zofran  given, NIH done on patient No deficits, patient stated his vision was becoming sensitive to light which was new RN let MD know as well.   06/29/24 1252  Vitals  Temp 98 F (36.7 C)  Temp Source Oral  BP (!) 147/69  MAP (mmHg) 88  BP Location Right Arm  BP Method Automatic  Patient Position (if appropriate) Lying  Pulse Rate 72  Pulse Rate Source Monitor  Resp 16  MEWS COLOR  MEWS Score Color Green  Oxygen Therapy  SpO2 100 %  O2 Device Room Air  MEWS Score  MEWS Temp 0  MEWS Systolic 0  MEWS Pulse 0  MEWS RR 0  MEWS LOC 0  MEWS Score 0

## 2024-06-30 ENCOUNTER — Encounter (HOSPITAL_COMMUNITY): Payer: Self-pay | Admitting: Internal Medicine

## 2024-06-30 ENCOUNTER — Ambulatory Visit (HOSPITAL_BASED_OUTPATIENT_CLINIC_OR_DEPARTMENT_OTHER): Admitting: Physical Therapy

## 2024-06-30 ENCOUNTER — Inpatient Hospital Stay (HOSPITAL_COMMUNITY): Admitting: Anesthesiology

## 2024-06-30 ENCOUNTER — Encounter (HOSPITAL_COMMUNITY)
Admission: EM | Disposition: A | Discharge status: Home / Self Care | Payer: Self-pay | Source: Ambulatory Visit | Attending: Internal Medicine

## 2024-06-30 ENCOUNTER — Other Ambulatory Visit: Payer: Self-pay

## 2024-06-30 DIAGNOSIS — Z7984 Long term (current) use of oral hypoglycemic drugs: Secondary | ICD-10-CM

## 2024-06-30 DIAGNOSIS — E1169 Type 2 diabetes mellitus with other specified complication: Secondary | ICD-10-CM

## 2024-06-30 DIAGNOSIS — I129 Hypertensive chronic kidney disease with stage 1 through stage 4 chronic kidney disease, or unspecified chronic kidney disease: Secondary | ICD-10-CM

## 2024-06-30 DIAGNOSIS — I10 Essential (primary) hypertension: Secondary | ICD-10-CM

## 2024-06-30 DIAGNOSIS — N189 Chronic kidney disease, unspecified: Secondary | ICD-10-CM

## 2024-06-30 DIAGNOSIS — E785 Hyperlipidemia, unspecified: Secondary | ICD-10-CM | POA: Diagnosis not present

## 2024-06-30 DIAGNOSIS — N184 Chronic kidney disease, stage 4 (severe): Secondary | ICD-10-CM

## 2024-06-30 DIAGNOSIS — M869 Osteomyelitis, unspecified: Secondary | ICD-10-CM

## 2024-06-30 DIAGNOSIS — E1122 Type 2 diabetes mellitus with diabetic chronic kidney disease: Secondary | ICD-10-CM

## 2024-06-30 LAB — CBC WITH DIFFERENTIAL/PLATELET
Abs Immature Granulocytes: 0.01 K/uL (ref 0.00–0.07)
Basophils Absolute: 0 K/uL (ref 0.0–0.1)
Basophils Relative: 1 %
Eosinophils Absolute: 0.1 K/uL (ref 0.0–0.5)
Eosinophils Relative: 3 %
HCT: 32.9 % — ABNORMAL LOW (ref 39.0–52.0)
Hemoglobin: 10.6 g/dL — ABNORMAL LOW (ref 13.0–17.0)
Immature Granulocytes: 0 %
Lymphocytes Relative: 26 %
Lymphs Abs: 1 K/uL (ref 0.7–4.0)
MCH: 31.1 pg (ref 26.0–34.0)
MCHC: 32.2 g/dL (ref 30.0–36.0)
MCV: 96.5 fL (ref 80.0–100.0)
Monocytes Absolute: 0.5 K/uL (ref 0.1–1.0)
Monocytes Relative: 13 %
Neutro Abs: 2.3 K/uL (ref 1.7–7.7)
Neutrophils Relative %: 57 %
Platelets: 203 K/uL (ref 150–400)
RBC: 3.41 MIL/uL — ABNORMAL LOW (ref 4.22–5.81)
RDW: 13.7 % (ref 11.5–15.5)
WBC: 4 K/uL (ref 4.0–10.5)
nRBC: 0 % (ref 0.0–0.2)

## 2024-06-30 LAB — RETICULOCYTES
Immature Retic Fract: 7.7 % (ref 2.3–15.9)
RBC.: 3.84 MIL/uL — ABNORMAL LOW (ref 4.22–5.81)
Retic Count, Absolute: 154.8 K/uL (ref 19.0–186.0)
Retic Ct Pct: 4 % — ABNORMAL HIGH (ref 0.4–3.1)

## 2024-06-30 LAB — BASIC METABOLIC PANEL WITH GFR
Anion gap: 6 (ref 5–15)
BUN: 32 mg/dL — ABNORMAL HIGH (ref 6–20)
CO2: 17 mmol/L — ABNORMAL LOW (ref 22–32)
Calcium: 7.8 mg/dL — ABNORMAL LOW (ref 8.9–10.3)
Chloride: 114 mmol/L — ABNORMAL HIGH (ref 98–111)
Creatinine, Ser: 4.22 mg/dL — ABNORMAL HIGH (ref 0.61–1.24)
GFR, Estimated: 17 mL/min — ABNORMAL LOW (ref >60)
Glucose, Bld: 111 mg/dL — ABNORMAL HIGH (ref 70–99)
Potassium: 5 mmol/L (ref 3.5–5.1)
Sodium: 137 mmol/L (ref 135–145)

## 2024-06-30 LAB — VITAMIN B12: Vitamin B-12: 192 pg/mL (ref 180–914)

## 2024-06-30 LAB — GLUCOSE, CAPILLARY
Glucose-Capillary: 113 mg/dL — ABNORMAL HIGH (ref 70–99)
Glucose-Capillary: 129 mg/dL — ABNORMAL HIGH (ref 70–99)
Glucose-Capillary: 134 mg/dL — ABNORMAL HIGH (ref 70–99)
Glucose-Capillary: 73 mg/dL (ref 70–99)
Glucose-Capillary: 88 mg/dL (ref 70–99)
Glucose-Capillary: 89 mg/dL (ref 70–99)

## 2024-06-30 LAB — FERRITIN: Ferritin: 56 ng/mL (ref 24–336)

## 2024-06-30 LAB — IRON AND TIBC
Iron: 99 ug/dL (ref 45–182)
Saturation Ratios: 34 % (ref 17.9–39.5)
TIBC: 294 ug/dL (ref 250–450)
UIBC: 195 ug/dL

## 2024-06-30 LAB — FOLATE: Folate: 8.1 ng/mL (ref >5.9)

## 2024-06-30 SURGERY — AMPUTATION, TOE
Anesthesia: Monitor Anesthesia Care | Site: Toe | Laterality: Right

## 2024-06-30 MED ORDER — AMISULPRIDE (ANTIEMETIC) 5 MG/2ML IV SOLN: 10.0000 mg | Freq: Once | INTRAVENOUS | Status: DC | PRN

## 2024-06-30 MED ORDER — MIDAZOLAM HCL 2 MG/2ML IJ SOLN
INTRAMUSCULAR | Status: DC | PRN
  Administered 2024-06-30: 2 mg via INTRAVENOUS

## 2024-06-30 MED ORDER — LIDOCAINE 2% (20 MG/ML) 5 ML SYRINGE
INTRAMUSCULAR | Status: DC | PRN
  Administered 2024-06-30: 50 mg via INTRAVENOUS

## 2024-06-30 MED ORDER — LIDOCAINE 2% (20 MG/ML) 5 ML SYRINGE
INTRAMUSCULAR | Status: AC
  Filled 2024-06-30: qty 5

## 2024-06-30 MED ORDER — FENTANYL CITRATE (PF) 100 MCG/2ML IJ SOLN: 25.0000 ug | INTRAMUSCULAR | Status: DC | PRN

## 2024-06-30 MED ORDER — LIDOCAINE HCL 2 % IJ SOLN
INTRAMUSCULAR | Status: AC
  Filled 2024-06-30: qty 20

## 2024-06-30 MED ORDER — CHLORHEXIDINE GLUCONATE 0.12 % MT SOLN
OROMUCOSAL | Status: AC
  Administered 2024-06-30: 15 mL via OROMUCOSAL
  Filled 2024-06-30: qty 15

## 2024-06-30 MED ORDER — ORAL CARE MOUTH RINSE: 15.0000 mL | Freq: Once | OROMUCOSAL | Status: AC

## 2024-06-30 MED ORDER — INSULIN ASPART 100 UNIT/ML IJ SOLN: 0.0000 [IU] | INTRAMUSCULAR | Status: DC | PRN

## 2024-06-30 MED ORDER — SODIUM CHLORIDE 0.9 % IV SOLN: INTRAVENOUS | Status: DC

## 2024-06-30 MED ORDER — PHENYLEPHRINE 80 MCG/ML (10ML) SYRINGE FOR IV PUSH (FOR BLOOD PRESSURE SUPPORT)
PREFILLED_SYRINGE | INTRAVENOUS | Status: DC | PRN
  Administered 2024-06-30: 80 ug via INTRAVENOUS
  Administered 2024-06-30: 160 ug via INTRAVENOUS
  Administered 2024-06-30: 80 ug via INTRAVENOUS

## 2024-06-30 MED ORDER — PROPOFOL 500 MG/50ML IV EMUL
INTRAVENOUS | Status: DC | PRN
Start: 2024-06-30 — End: 2024-06-30
  Administered 2024-06-30: 100 ug/kg/min via INTRAVENOUS
  Administered 2024-06-30 (×2): 20 mg via INTRAVENOUS
  Administered 2024-06-30: 40 mg via INTRAVENOUS

## 2024-06-30 MED ORDER — MIDAZOLAM HCL 2 MG/2ML IJ SOLN
INTRAMUSCULAR | Status: AC
  Filled 2024-06-30: qty 2

## 2024-06-30 MED ORDER — LIDOCAINE HCL (PF) 2 % IJ SOLN
INTRAMUSCULAR | Status: DC | PRN
  Administered 2024-06-30: 10 mL

## 2024-06-30 MED ORDER — ONDANSETRON HCL 4 MG/2ML IJ SOLN: 4.0000 mg | Freq: Once | INTRAMUSCULAR | Status: DC | PRN

## 2024-06-30 MED ORDER — 0.9 % SODIUM CHLORIDE (POUR BTL) OPTIME
TOPICAL | Status: DC | PRN
  Administered 2024-06-30: 1000 mL

## 2024-06-30 MED ORDER — BUPIVACAINE HCL (PF) 0.5 % IJ SOLN
INTRAMUSCULAR | Status: AC
  Filled 2024-06-30: qty 30

## 2024-06-30 MED ORDER — PROPOFOL 1000 MG/100ML IV EMUL
INTRAVENOUS | Status: AC
  Filled 2024-06-30: qty 100

## 2024-06-30 MED ORDER — CHLORHEXIDINE GLUCONATE 0.12 % MT SOLN: 15.0000 mL | Freq: Once | OROMUCOSAL | Status: AC

## 2024-06-30 MED ORDER — PHENYLEPHRINE 80 MCG/ML (10ML) SYRINGE FOR IV PUSH (FOR BLOOD PRESSURE SUPPORT)
PREFILLED_SYRINGE | INTRAVENOUS | Status: AC
  Filled 2024-06-30: qty 10

## 2024-06-30 MED ORDER — LACTATED RINGERS IV SOLN: INTRAVENOUS | Status: DC

## 2024-06-30 SURGICAL SUPPLY — 35 items
BAG COUNTER SPONGE SURGICOUNT (BAG) ×1 IMPLANT
BLADE SURG 15 STRL LF DISP TIS (BLADE) IMPLANT
BNDG ELASTIC 4INX 5YD STR LF (GAUZE/BANDAGES/DRESSINGS) IMPLANT
BNDG ELASTIC 4X5.8 VLCR STR LF (GAUZE/BANDAGES/DRESSINGS) IMPLANT
BNDG GAUZE DERMACEA FLUFF 4 (GAUZE/BANDAGES/DRESSINGS) ×1 IMPLANT
CHLORAPREP W/TINT 26 (MISCELLANEOUS) IMPLANT
COVER SURGICAL LIGHT HANDLE (MISCELLANEOUS) ×1 IMPLANT
CUFF TOURN SGL QUICK 18X4 (TOURNIQUET CUFF) ×1 IMPLANT
CUFF TRNQT CYL 24X4X16.5-23 (TOURNIQUET CUFF) IMPLANT
DRSG XEROFORM 1X8 (GAUZE/BANDAGES/DRESSINGS) IMPLANT
ELECTRODE REM PT RTRN 9FT ADLT (ELECTROSURGICAL) IMPLANT
GAUZE PAD ABD 8X10 STRL (GAUZE/BANDAGES/DRESSINGS) ×1 IMPLANT
GAUZE SPONGE 4X4 12PLY STRL (GAUZE/BANDAGES/DRESSINGS) ×1 IMPLANT
GAUZE XEROFORM 1X8 LF (GAUZE/BANDAGES/DRESSINGS) ×1 IMPLANT
GLOVE BIO SURGEON STRL SZ8 (GLOVE) ×1 IMPLANT
GLOVE BIOGEL PI IND STRL 8 (GLOVE) ×1 IMPLANT
GOWN STRL REUS W/ TWL LRG LVL3 (GOWN DISPOSABLE) ×2 IMPLANT
KIT BASIN OR (CUSTOM PROCEDURE TRAY) ×1 IMPLANT
KIT TURNOVER KIT B (KITS) ×1 IMPLANT
NDL PRECISIONGLIDE 27X1.5 (NEEDLE) ×1 IMPLANT
NEEDLE PRECISIONGLIDE 27X1.5 (NEEDLE) ×1 IMPLANT
NS IRRIG 1000ML POUR BTL (IV SOLUTION) ×1 IMPLANT
PACK ORTHO EXTREMITY (CUSTOM PROCEDURE TRAY) ×1 IMPLANT
PAD ARMBOARD POSITIONER FOAM (MISCELLANEOUS) ×2 IMPLANT
PAD CAST 4YDX4 CTTN HI CHSV (CAST SUPPLIES) ×1 IMPLANT
SOL PREP POV-IOD 4OZ 10% (MISCELLANEOUS) ×1 IMPLANT
STAPLER SKIN PROX 35W (STAPLE) IMPLANT
SUT PROLENE 4 0 PS 2 18 (SUTURE) IMPLANT
SUT VIC AB 3-0 PS2 18XBRD (SUTURE) IMPLANT
SUT VIC AB 4-0 PS2 18 (SUTURE) IMPLANT
SYR CONTROL 10ML LL (SYRINGE) ×1 IMPLANT
TOWEL GREEN STERILE (TOWEL DISPOSABLE) ×1 IMPLANT
TOWEL GREEN STERILE FF (TOWEL DISPOSABLE) ×1 IMPLANT
TUBE CONNECTING 12X1/4 (SUCTIONS) ×1 IMPLANT
YANKAUER SUCT BULB TIP NO VENT (SUCTIONS) IMPLANT

## 2024-06-30 NOTE — Brief Op Note (Signed)
 06/30/2024  1:27 PM  PATIENT:  Dwayne Rocha  48 y.o. male  PRE-OPERATIVE DIAGNOSIS:  Osteomyelitis of right 2nd toe  POST-OPERATIVE DIAGNOSIS:  Osteomyelitis of right 2nd toe  PROCEDURE:  Procedure(s): RIGHT SECOND TOE AMPUTATION (Right)  SURGEON:  Surgeons and Role:    DEWAINE Janit Thresa HERO, DPM - Primary  PHYSICIAN ASSISTANT:   ASSISTANTS: none   ANESTHESIA:   local and IV sedation  EBL:  5 mL   BLOOD ADMINISTERED:none  DRAINS: none   LOCAL MEDICATIONS USED:  MARCAINE    , LIDOCAINE  , and Amount: 10 ml  SPECIMEN:  No Specimen  DISPOSITION OF SPECIMEN:  N/A  COUNTS:  YES  TOURNIQUET:   Total Tourniquet Time Documented: Calf (Right) - 19 minutes Total: Calf (Right) - 19 minutes   DICTATION: .Nechama Dictation  PLAN OF CARE: Admit to inpatient   PATIENT DISPOSITION:  PACU - hemodynamically stable.   Delay start of Pharmacological VTE agent (>24hrs) due to surgical blood loss or risk of bleeding: no  Thresa EMERSON Janit, DPM [facility]  Dr. Thresa EMERSON Janit, DPM    [address] [place], [place] [address]                Office [phone]  Fax [phone]

## 2024-06-30 NOTE — Transfer of Care (Signed)
 Immediate Anesthesia Transfer of Care Note  Patient: Dwayne Rocha  Procedure(s) Performed: RIGHT SECOND TOE AMPUTATION (Right: Toe)  Patient Location: PACU  Anesthesia Type:MAC  Level of Consciousness: drowsy  Airway & Oxygen Therapy: Patient Spontanous Breathing and Patient connected to nasal cannula oxygen  Post-op Assessment: Report given to RN and Post -op Vital signs reviewed and stable  Post vital signs: Reviewed and stable  Last Vitals:  Vitals Value Taken Time  BP 124/69 06/30/24 13:18  Temp 98.1   Pulse 62 06/30/24 13:19  Resp 18 06/30/24 13:19  SpO2 97 % 06/30/24 13:19  Vitals shown include unfiled device data.  Last Pain:  Vitals:   06/30/24 1116  TempSrc:   PainSc: 2       Patients Stated Pain Goal: 0 (06/30/24 1116)  Complications: No notable events documented.

## 2024-06-30 NOTE — Anesthesia Preprocedure Evaluation (Addendum)
 Anesthesia Evaluation  Patient identified by MRN, date of birth, ID band Patient awake    Reviewed: Allergy & Precautions, NPO status , Patient's Chart, lab work & pertinent test results, reviewed documented beta blocker date and time   Airway Mallampati: III  TM Distance: >3 FB Neck ROM: Full    Dental  (+) Dental Advisory Given, Missing   Pulmonary sleep apnea , former smoker   Pulmonary exam normal breath sounds clear to auscultation       Cardiovascular hypertension, Pt. on medications and Pt. on home beta blockers Normal cardiovascular exam Rhythm:Regular Rate:Normal  Echocardiogram 03/15/2020: Normal LV systolic function with visual EF 55-60%. Left ventricle cavity is normal in size. Mild left ventricular hypertrophy. Normal global wall motion. Normal diastolic filling pattern, normal LAP. Mild (Grade I) aortic regurgitation. No other significant valvular abnormalities. No prior study for comparison.   Neuro/Psych  PSYCHIATRIC DISORDERS Anxiety Depression     Neuromuscular disease    GI/Hepatic Neg liver ROS,GERD  Medicated,,  Endo/Other  diabetes, Type 2, Oral Hypoglycemic Agents  Obesity   Renal/GU Renal InsufficiencyRenal disease     Musculoskeletal  (+) Arthritis ,  Osteomyelitis of right 2nd toe   Abdominal   Peds  Hematology  (+) Blood dyscrasia, anemia   Anesthesia Other Findings Day of surgery medications reviewed with the patient.  Reproductive/Obstetrics                              Anesthesia Physical Anesthesia Plan  ASA: 3  Anesthesia Plan: MAC   Post-op Pain Management: Tylenol  PO (pre-op)*   Induction: Intravenous  PONV Risk Score and Plan: 1 and Midazolam , TIVA, Dexamethasone  and Ondansetron   Airway Management Planned: Simple Face Mask and Natural Airway  Additional Equipment:   Intra-op Plan:   Post-operative Plan:   Informed Consent: I have  reviewed the patients History and Physical, chart, labs and discussed the procedure including the risks, benefits and alternatives for the proposed anesthesia with the patient or authorized representative who has indicated his/her understanding and acceptance.     Dental advisory given  Plan Discussed with: CRNA  Anesthesia Plan Comments:          Anesthesia Quick Evaluation

## 2024-06-30 NOTE — Progress Notes (Signed)
 [facility]                                                                               [name], is a 48 y.o. male, DOB - 03/01/1976, FMW:[id number] Admit date - 06/28/2024    Outpatient Primary MD for the patient is Shelda Atlas, MD  LOS - 1  days    Brief summary   Dwayne Rocha is a 48 y.o. male with medical history significant of s/p L BKA, R great toe amputation, and HTN, HLD, DM2, and CKD p/w worsening RLE 2nd digit wound . XR R foot c/w OM and septic arthritis of RLE 2nd DIP. EDP requested medicine admission after consulted Podiatry who plans for RLE 2nd digit amputation.    Assessment & Plan    Assessment and Plan:   RLE / 2nd digit wound/ osteomyelitis of the 2 nd toe: - podiatry consulted, plan for 2 nd toe amputation,.  - pain control.  - patient on IV linezolid  and IV cefepime .    Type 2 DM CBG (last 3)  Recent Labs    06/30/24 0008 06/30/24 0647 06/30/24 1112  GLUCAP 113* 89 129*   Resume SSI. A1c is 5.8% Holding home meds for now.   Hypertension Well controlled. Continue with metoprolol  and hydralazine .    Stage 4 CKD Monitor.   Hyperlipidemia Resume statin.   S/p L BKA   Anemia of chronic disease Hemoglobin around 10. Monitor.  Check anemia panel.    Estimated body mass index is 38.72 kg/m as calculated from the following:   Height as of this encounter: 5' 10 (1.778 m).   Weight as of this encounter: 122.4 kg.  Code Status: full code DVT Prophylaxis:  SCDs Start: 06/29/24 0028   Level of Care: Level of care: Telemetry Medical Family Communication: family at bedside  Disposition Plan:     Remains inpatient appropriate:  pending   Procedures:  Plan for amputation.  Consultants:   podiatry  Antimicrobials:   Anti-infectives (From admission, onward)    Start     Dose/Rate Route Frequency Ordered Stop   06/29/24 1600  [MAR Hold]  ceFEPIme  (MAXIPIME ) 2 g in sodium chloride  0.9 % 100 mL IVPB         (MAR Hold since Wed 06/30/2024 at 1102.Hold Reason: Transfer to a Procedural area)   2 g 200 mL/hr over 30 Minutes Intravenous Every 24 hours 06/28/24 1741     06/28/24 1745  [MAR Hold]  linezolid  (ZYVOX ) IVPB 600 mg        (MAR Hold since Wed 06/30/2024 at 1102.Hold Reason: Transfer to a Procedural area)   600 mg 300 mL/hr over 60 Minutes Intravenous Every 12 hours 06/28/24 1741     06/28/24 1645  vancomycin  (VANCOREADY) IVPB 1750 mg/350 mL  Status:  Discontinued        1,750 mg 175 mL/hr over 120 Minutes Intravenous  Once 06/28/24 1640 06/28/24 1741   06/28/24 1630  vancomycin  (VANCOCIN ) IVPB 1000 mg/200 mL premix  Status:  Discontinued        1,000 mg 200 mL/hr over 60 Minutes Intravenous  Once 06/28/24 1627 06/28/24 1640   06/28/24  1630  ceFEPIme  (MAXIPIME ) 2 g in sodium chloride  0.9 % 100 mL IVPB        2 g 200 mL/hr over 30 Minutes Intravenous  Once 06/28/24 1627 06/28/24 1716        Medications  Scheduled Meds:  [MAR Hold] hydrALAZINE   100 mg Oral BID   [MAR Hold] insulin  aspart  0-15 Units Subcutaneous TID WC   [MAR Hold] metoprolol  tartrate  100 mg Oral BID   [MAR Hold] pantoprazole   40 mg Oral Daily   [MAR Hold] rosuvastatin   5 mg Oral Daily   [MAR Hold] sodium chloride  flush  3 mL Intravenous Q12H   [MAR Hold] Solriamfetol  HCl  1 tablet Oral q morning   [MAR Hold] venlafaxine  XR  75 mg Oral Q breakfast   Continuous Infusions:  sodium chloride  75 mL/hr at 06/30/24 1147   sodium chloride      [MAR Hold] ceFEPime  (MAXIPIME ) IV 2 g (06/29/24 1518)   [MAR Hold] linezolid  (ZYVOX ) IV 600 mg (06/30/24 0935)   PRN Meds:.[MAR Hold] HYDROcodone -acetaminophen , insulin  aspart    Subjective:   Dwayne Rocha was seen and examined today.    Objective:   Vitals:   06/29/24 2119 06/30/24 0437 06/30/24 0752 06/30/24 1106  BP:  (!) 145/82 (!) 142/76 138/78  Pulse: 61 65 65 63  Resp:  17 18 20   Temp:  98.4 F (36.9 C) 98.3 F (36.8 C) 98.4 F (36.9 C)  TempSrc:  Oral   Oral  SpO2:  99% 97% 98%  Weight:    122.4 kg  Height:    5' 10 (1.778 m)    Intake/Output Summary (Last 24 hours) at 06/30/2024 1210 Last data filed at 06/30/2024 1000 Gross per 24 hour  Intake 0 ml  Output 900 ml  Net -900 ml   Filed Weights   06/28/24 1531 06/30/24 1106  Weight: 122.5 kg 122.4 kg     Exam General exam: Appears calm and comfortable  Respiratory system: Clear to auscultation. Respiratory effort normal. Cardiovascular system: S1 & S2 heard, RRR. Gastrointestinal system: Abdomen is nondistended, soft and nontender. Central nervous system: Alert and oriented.  Extremities: leFt BKA Skin: 2nd toe wound Psychiatry:  Mood & affect appropriate.     Data Reviewed:  I have personally reviewed following labs and imaging studies   CBC Lab Results  Component Value Date   WBC 4.0 06/30/2024   RBC 3.41 (L) 06/30/2024   HGB 10.6 (L) 06/30/2024   HCT 32.9 (L) 06/30/2024   MCV 96.5 06/30/2024   MCH 31.1 06/30/2024   PLT 203 06/30/2024   MCHC 32.2 06/30/2024   RDW 13.7 06/30/2024   LYMPHSABS 1.0 06/30/2024   MONOABS 0.5 06/30/2024   EOSABS 0.1 06/30/2024   BASOSABS 0.0 06/30/2024     Last metabolic panel Lab Results  Component Value Date   NA 137 06/30/2024   K 5.0 06/30/2024   CL 114 (H) 06/30/2024   CO2 17 (L) 06/30/2024   BUN 32 (H) 06/30/2024   CREATININE 4.22 (H) 06/30/2024   GLUCOSE 111 (H) 06/30/2024   GFRNONAA 17 (L) 06/30/2024   GFRAA 56 (L) 01/04/2021   CALCIUM  7.8 (L) 06/30/2024   PHOS 4.8 (H) 06/29/2024   PROT 6.6 06/28/2024   ALBUMIN 3.2 (L) 06/28/2024   BILITOT 0.4 06/28/2024   ALKPHOS 114 06/28/2024   AST 22 06/28/2024   ALT 23 06/28/2024   ANIONGAP 6 06/30/2024    CBG (last 3)  Recent Labs    06/30/24 0008  06/30/24 0647 06/30/24 1112  GLUCAP 113* 89 129*      Coagulation Profile: Recent Labs  Lab 06/29/24 0625  INR 0.9     Radiology Studies: MR TOES RIGHT WO CONTRAST Result Date: 06/28/2024 CLINICAL DATA:   Diabetes, second toe infection, erosive findings on radiography EXAM: MRI OF THE RIGHT TOES WITHOUT CONTRAST TECHNIQUE: Multiplanar, multisequence MR imaging of the right foot from the Lisfranc joint through the toes was performed. No intravenous contrast was administered. COMPARISON:  Radiographs 06/28/2024 FINDINGS: Bones/Joint/Cartilage Prior amputation of the first toe at the midshaft proximal phalanx level without visible complicating feature. There is abnormal edema signal throughout the distal phalanx of the second toe. In corroboration with the demineralization of the tuft on radiography the appearance is compatible with acute osteomyelitis of these of distal phalanx second toe. Mild degenerative subcortical marrow edema dorsally at the junction between the middle cuneiform and the second metatarsal base, and to a lesser extent dorsally along the medial cuneiform. Ligaments Lisfranc ligament intact. Muscles and Tendons Unremarkable Soft tissues Infiltrative subcutaneous edema dorsal to the distal metatarsals especially laterally as on image 27 series 8. Cutaneous thickening and subcutaneous edema distally in the second toe favoring cellulitis. IMPRESSION: 1. Acute osteomyelitis of the distal phalanx of the second toe. 2. Prior amputation of the first toe at the midshaft proximal phalanx level without visible complicating feature. 3. Infiltrative subcutaneous edema dorsal to the distal metatarsals especially laterally. 4. Cutaneous thickening and subcutaneous edema distally in the second toe favoring cellulitis. Electronically Signed   By: Ryan Salvage M.D.   On: 06/28/2024 18:31   DG Toe 2nd Right Result Date: 06/28/2024 CLINICAL DATA:  Diabetes, second digit infection EXAM: RIGHT SECOND TOE COMPARISON:  06/16/2024 FINDINGS: Frontal, oblique, and lateral views of the right second digit are obtained. Diffuse soft tissue swelling of the second digit is again noted, with persistent erosive changes of  the distal tuft of the second distal phalanx compatible with osteomyelitis. There is now erosive change surrounding the second distal interphalangeal joint and distal margin of the second middle phalanx, concerning for septic arthritis and progressive osteomyelitis. No subcutaneous gas or radiopaque foreign body. Prior first digit amputation at the level of the first proximal phalanx. IMPRESSION: 1. Erosive changes within the second middle and distal phalanges as above, consistent with progressive osteomyelitis and suspected septic arthritis of the second distal interphalangeal joint. 2. Marked soft tissue swelling throughout the second digit. No subcutaneous gas or radiopaque foreign body. Electronically Signed   By: Ozell Daring M.D.   On: 06/28/2024 16:12       Elgie Butter M.D. [facility] 06/30/2024, 12:10 PM  Available via Epic secure chat 7am-7pm After 7 pm, please refer to night coverage provider listed on amion.

## 2024-06-30 NOTE — Anesthesia Postprocedure Evaluation (Signed)
 Anesthesia Post Note  Patient: Dwayne Rocha  Procedure(s) Performed: RIGHT SECOND TOE AMPUTATION (Right: Toe)     Patient location during evaluation: PACU Anesthesia Type: MAC Level of consciousness: awake and alert Pain management: pain level controlled Vital Signs Assessment: post-procedure vital signs reviewed and stable Respiratory status: spontaneous breathing, nonlabored ventilation and respiratory function stable Cardiovascular status: stable and blood pressure returned to baseline Postop Assessment: no apparent nausea or vomiting Anesthetic complications: no   No notable events documented.  Last Vitals:  Vitals:   06/30/24 1430 06/30/24 1506  BP: (!) 147/79 (!) 149/85  Pulse: 62 61  Resp: 12 17  Temp: 36.7 C 36.8 C  SpO2: 96% 100%    Last Pain:  Vitals:   06/30/24 1506  TempSrc: Oral  PainSc: 0-No pain                 Garnette FORBES Skillern

## 2024-06-30 NOTE — Interval H&P Note (Signed)
 History and Physical Interval Note:  06/30/2024 12:19 PM  Dwayne Rocha  has presented today for surgery, with the diagnosis of Osteomyelitis of right 2nd toe.  The various methods of treatment have been discussed with the patient and family. After consideration of risks, benefits and other options for treatment, the patient has consented to  Procedure(s) with comments: AMPUTATION, TOE (Right) - Amputation right 2nd toe as a surgical intervention.  The patient's history has been reviewed, patient examined, no change in status, stable for surgery.  I have reviewed the patient's chart and labs.  Questions were answered to the patient's satisfaction.     Thresa CHRISTELLA Sar

## 2024-06-30 NOTE — Progress Notes (Signed)
 late entry:- patient reviewed later in evening his symptoms are much better with 1 dose meclizine . i have asked for vestibular rehab.  he is cleared from [facility] end for surgery as long as no significant recurrence of the vertigo

## 2024-06-30 NOTE — Progress Notes (Signed)
 PT Cancellation Note  Patient Details Name: Dwayne Rocha MRN: [id number] DOB: 10-Jan-1976   Cancelled Treatment:    Reason Eval/Treat Not Completed: Patient at procedure or test/unavailable. Pt leaving for OR to undergoing R 2nd toe amputation. Acute PT to return as able, as appropriate to complete PT eval.  [name], PT, DPT Acute Rehabilitation Services Secure chat preferred Office #: [phone]    Norene CHRISTELLA Ames 06/30/2024, 10:16 AM

## 2024-06-30 NOTE — Op Note (Signed)
   OPERATIVE REPORT Patient name: Dwayne Rocha MRN: [id number] DOB: December 02, 1975  DOS: 07/01/24  Preop Dx: Osteomyelitis right second toe Postop Dx: same  Procedure:  1.  Amputation right second toe  Surgeon: Thresa EMERSON Sar DPM  Anesthesia: 50-50 mixture of 2% lidocaine  plain with 0.5% Marcaine  plain totaling 10 mL infiltrated in the patient's right  Hemostasis: Calf tourniquet inflated to a pressure of after esmarch exsanguination   EBL: Minimal mL Materials: None Injectables: None Pathology: None.  Anticipate clean margins  Condition: The patient tolerated the procedure and anesthesia well. No complications noted or reported   Justification for procedure: The patient presents for surgical correction of osteomyelitis to.  The patient was told benefits as well as possible side effects of the surgery. The patient consented for surgical correction. The patient consent form was reviewed. All patient questions were answered. No guarantees were expressed or implied. The patient and the surgeon both signed the patient consent form placed in the patient's chart.   Procedure in Detail: The patient was brought to the operating room in the supine position at which time an aseptic scrub and drape were performed about the patient's lower extremity after anesthesia was induced as described above. Attention was then directed to the surgical area where procedure commenced.  Procedure #1: Amputation right second toe A racquet type incision was planned and made about the base of the infected toe.  Incision was carried down to the level of the MTP joint which time a capsulotomy was performed to disarticulate the joint at the level of the MTP.  The toe was distracted distally and disarticulated and removed in toto.  Any exposed tendons were distracted distally and cut as far proximal as could be visualized. Any necrotic tissue was excisionally debrided using a #15 scalpel down to healthier  margins.  At this time irrigation was performed using bulb syringe and normal saline in preparation for primary closure.  4-0 Prolene suture was utilized to reapproximate skin edges for primary closure.  Dry sterile compressive dressings were then applied to all previously mentioned incision sites about the patient's lower extremity. The tourniquet which was used for hemostasis was deflated. All normal neurovascular responses including pink color and warmth returned all the digits of patient's lower extremity.  The patient was then transferred from the operating room to the recovery room having tolerated the procedure and anesthesia well. All vital signs are stable. After a brief stay in the recovery room the patient was readmitted to inpatient room with postoperative orders placed.    IMPRESSION: clean margins.   Thresa EMERSON Sar, DPM [facility]  Dr. Thresa EMERSON Sar, DPM    [address] [place], [place] [address]                Office [phone]  Fax [phone]

## 2024-06-30 NOTE — H&P (Signed)
 Anesthesia H&P Update: History and Physical Exam reviewed; patient is OK for planned anesthetic and procedure. ? ?

## 2024-07-01 ENCOUNTER — Encounter (HOSPITAL_COMMUNITY): Payer: Self-pay | Admitting: Podiatry

## 2024-07-01 DIAGNOSIS — I10 Essential (primary) hypertension: Secondary | ICD-10-CM | POA: Diagnosis not present

## 2024-07-01 DIAGNOSIS — M86171 Other acute osteomyelitis, right ankle and foot: Secondary | ICD-10-CM

## 2024-07-01 DIAGNOSIS — N189 Chronic kidney disease, unspecified: Secondary | ICD-10-CM | POA: Diagnosis not present

## 2024-07-01 DIAGNOSIS — E785 Hyperlipidemia, unspecified: Secondary | ICD-10-CM | POA: Diagnosis not present

## 2024-07-01 DIAGNOSIS — M869 Osteomyelitis, unspecified: Secondary | ICD-10-CM | POA: Diagnosis not present

## 2024-07-01 LAB — BASIC METABOLIC PANEL WITH GFR
Anion gap: 7 (ref 5–15)
BUN: 34 mg/dL — ABNORMAL HIGH (ref 6–20)
CO2: 18 mmol/L — ABNORMAL LOW (ref 22–32)
Calcium: 8 mg/dL — ABNORMAL LOW (ref 8.9–10.3)
Chloride: 112 mmol/L — ABNORMAL HIGH (ref 98–111)
Creatinine, Ser: 4.33 mg/dL — ABNORMAL HIGH (ref 0.61–1.24)
GFR, Estimated: 16 mL/min — ABNORMAL LOW (ref >60)
Glucose, Bld: 98 mg/dL (ref 70–99)
Potassium: 4.8 mmol/L (ref 3.5–5.1)
Sodium: 137 mmol/L (ref 135–145)

## 2024-07-01 LAB — CBC WITH DIFFERENTIAL/PLATELET
Abs Immature Granulocytes: 0.01 K/uL (ref 0.00–0.07)
Basophils Absolute: 0 K/uL (ref 0.0–0.1)
Basophils Relative: 0 %
Eosinophils Absolute: 0.1 K/uL (ref 0.0–0.5)
Eosinophils Relative: 3 %
HCT: 31.7 % — ABNORMAL LOW (ref 39.0–52.0)
Hemoglobin: 10.4 g/dL — ABNORMAL LOW (ref 13.0–17.0)
Immature Granulocytes: 0 %
Lymphocytes Relative: 18 %
Lymphs Abs: 0.9 K/uL (ref 0.7–4.0)
MCH: 31 pg (ref 26.0–34.0)
MCHC: 32.8 g/dL (ref 30.0–36.0)
MCV: 94.3 fL (ref 80.0–100.0)
Monocytes Absolute: 0.5 K/uL (ref 0.1–1.0)
Monocytes Relative: 10 %
Neutro Abs: 3.4 K/uL (ref 1.7–7.7)
Neutrophils Relative %: 69 %
Platelets: 174 K/uL (ref 150–400)
RBC: 3.36 MIL/uL — ABNORMAL LOW (ref 4.22–5.81)
RDW: 13.4 % (ref 11.5–15.5)
WBC: 4.9 K/uL (ref 4.0–10.5)
nRBC: 0 % (ref 0.0–0.2)

## 2024-07-01 LAB — GLUCOSE, CAPILLARY
Glucose-Capillary: 129 mg/dL — ABNORMAL HIGH (ref 70–99)
Glucose-Capillary: 122 mg/dL — ABNORMAL HIGH (ref 70–99)
Glucose-Capillary: 114 mg/dL — ABNORMAL HIGH (ref 70–99)

## 2024-07-01 MED ORDER — DOXYCYCLINE HYCLATE 100 MG PO TABS: 100.0000 mg | ORAL_TABLET | Freq: Two times a day (BID) | ORAL | 0 refills | Status: AC

## 2024-07-01 MED ORDER — AMOXICILLIN-POT CLAVULANATE 875-125 MG PO TABS: 1.0000 | ORAL_TABLET | Freq: Two times a day (BID) | ORAL | 0 refills | Status: AC

## 2024-07-01 MED ORDER — CYANOCOBALAMIN 1000 MCG/ML IJ SOLN
1000.0000 ug | Freq: Once | INTRAMUSCULAR | Status: AC
  Administered 2024-07-01: 1000 ug via INTRAMUSCULAR
  Filled 2024-07-01: qty 1

## 2024-07-01 NOTE — Progress Notes (Signed)
Ortho tech called for post op shoe.  

## 2024-07-01 NOTE — Progress Notes (Signed)
 [facility]                                                                               Dwayne Rocha, is a 48 y.o. male, DOB - 05-13-1976, FMW:[id number] Admit date - 06/28/2024    Outpatient Primary MD for the patient is Dwayne Atlas, MD  LOS - 2  days    Brief summary   Dwayne Rocha is a 48 y.o. male with medical history significant of s/p L BKA, R great toe amputation, and HTN, HLD, DM2, and CKD p/w worsening RLE 2nd digit wound . XR R foot c/w OM and septic arthritis of RLE 2nd DIP. EDP requested medicine admission after consulted Podiatry who plans for RLE 2nd digit amputation.    Assessment & Plan    Assessment and Plan:   RLE / 2nd digit wound/ osteomyelitis of the 2 nd toe: - podiatry consulted, underwent  2 nd toe amputation,.  - pain control.  - patient on IV linezolid  and IV cefepime . Plan for discharge tomorrow on oral antibiotics.    Type 2 DM CBG (last 3)  Recent Labs    06/30/24 2134 07/01/24 0648 07/01/24 1159  GLUCAP 134* 129* 122*   Resume SSI. A1c is 5.8% Resume home meds on discharge.   Hypertension Well controlled. Continue with metoprolol  and hydralazine .    Stage 4 CKD Monitor.   Hyperlipidemia Resume statin.   S/p L BKA   Anemia of chronic disease Hemoglobin around 10. Monitor.  Anemia panel reviewed.    Estimated body mass index is 38.72 kg/m as calculated from the following:   Height as of this encounter: 5' 10 (1.778 m).   Weight as of this encounter: 122.4 kg.  Code Status: full code DVT Prophylaxis:  SCDs Start: 06/30/24 1508 SCDs Start: 06/29/24 0028   Level of Care: Level of care: Telemetry Medical Family Communication: family at bedside  Disposition Plan:     Remains inpatient appropriate:  pending   Procedures:  Plan for amputation.  Consultants:   podiatry  Antimicrobials:   Anti-infectives (From admission, onward)    Start     Dose/Rate Route Frequency Ordered Stop    06/29/24 1600  ceFEPIme  (MAXIPIME ) 2 g in sodium chloride  0.9 % 100 mL IVPB        2 g 200 mL/hr over 30 Minutes Intravenous Every 24 hours 06/28/24 1741     06/28/24 1745  linezolid  (ZYVOX ) IVPB 600 mg        600 mg 300 mL/hr over 60 Minutes Intravenous Every 12 hours 06/28/24 1741     06/28/24 1645  vancomycin  (VANCOREADY) IVPB 1750 mg/350 mL  Status:  Discontinued        1,750 mg 175 mL/hr over 120 Minutes Intravenous  Once 06/28/24 1640 06/28/24 1741   06/28/24 1630  vancomycin  (VANCOCIN ) IVPB 1000 mg/200 mL premix  Status:  Discontinued        1,000 mg 200 mL/hr over 60 Minutes Intravenous  Once 06/28/24 1627 06/28/24 1640   06/28/24 1630  ceFEPIme  (MAXIPIME ) 2 g in sodium chloride  0.9 % 100 mL IVPB        2 g 200 mL/hr  over 30 Minutes Intravenous  Once 06/28/24 1627 06/28/24 1716        Medications  Scheduled Meds:  hydrALAZINE   100 mg Oral BID   insulin  aspart  0-15 Units Subcutaneous TID WC   metoprolol  tartrate  100 mg Oral BID   pantoprazole   40 mg Oral Daily   rosuvastatin   5 mg Oral Daily   sodium chloride  flush  3 mL Intravenous Q12H   Solriamfetol  HCl  1 tablet Oral q morning   venlafaxine  XR  75 mg Oral Q breakfast   Continuous Infusions:  ceFEPime  (MAXIPIME ) IV 2 g (06/30/24 1520)   linezolid  (ZYVOX ) IV 600 mg (07/01/24 0927)   PRN Meds:.HYDROcodone -acetaminophen     Subjective:   Dwayne Rocha was seen and examined today.  Pain, , no chest pain or sob. No nausea or vomiting.   Objective:   Vitals:   06/30/24 1506 06/30/24 2030 07/01/24 0340 07/01/24 0739  BP: (!) 149/85 (!) 150/64 (!) 157/64 (!) 143/84  Pulse: 61 73 69 69  Resp: 17 16 16 17   Temp: 98.3 F (36.8 C) 98.3 F (36.8 C) 99.2 F (37.3 C) 98.6 F (37 C)  TempSrc: Oral Oral Axillary Oral  SpO2: 100% 98% 99% 97%  Weight:      Height:        Intake/Output Summary (Last 24 hours) at 07/01/2024 1443 Last data filed at 07/01/2024 1025 Gross per 24 hour  Intake 1410 ml  Output 1500  ml  Net -90 ml   Filed Weights   06/28/24 1531 06/30/24 1106  Weight: 122.5 kg 122.4 kg     Exam General exam: Appears calm and comfortable  Respiratory system: Clear to auscultation. Respiratory effort normal. Cardiovascular system: S1 & S2 heard, RRR. No JVD, Gastrointestinal system: Abdomen is nondistended, soft and nontender.  Central nervous system: Alert and oriented. No focal neurological deficits. Extremities:left BKA.  Skin: RIGHT FOOT bandaged.  Psychiatry: Mood & affect appropriate.    Data Reviewed:  I have personally reviewed following labs and imaging studies   CBC Lab Results  Component Value Date   WBC 4.9 07/01/2024   RBC 3.36 (L) 07/01/2024   HGB 10.4 (L) 07/01/2024   HCT 31.7 (L) 07/01/2024   MCV 94.3 07/01/2024   MCH 31.0 07/01/2024   PLT 174 07/01/2024   MCHC 32.8 07/01/2024   RDW 13.4 07/01/2024   LYMPHSABS 0.9 07/01/2024   MONOABS 0.5 07/01/2024   EOSABS 0.1 07/01/2024   BASOSABS 0.0 07/01/2024     Last metabolic panel Lab Results  Component Value Date   NA 137 07/01/2024   K 4.8 07/01/2024   CL 112 (H) 07/01/2024   CO2 18 (L) 07/01/2024   BUN 34 (H) 07/01/2024   CREATININE 4.33 (H) 07/01/2024   GLUCOSE 98 07/01/2024   GFRNONAA 16 (L) 07/01/2024   GFRAA 56 (L) 01/04/2021   CALCIUM  8.0 (L) 07/01/2024   PHOS 4.8 (H) 06/29/2024   PROT 6.6 06/28/2024   ALBUMIN 3.2 (L) 06/28/2024   BILITOT 0.4 06/28/2024   ALKPHOS 114 06/28/2024   AST 22 06/28/2024   ALT 23 06/28/2024   ANIONGAP 7 07/01/2024    CBG (last 3)  Recent Labs    06/30/24 2134 07/01/24 0648 07/01/24 1159  GLUCAP 134* 129* 122*      Coagulation Profile: Recent Labs  Lab 06/29/24 0625  INR 0.9     Radiology Studies: No results found.      Elgie Butter M.D. [facility] 07/01/2024, 2:43 PM  Available via Epic secure chat 7am-7pm After 7 pm, please refer to night coverage provider listed on amion.

## 2024-07-01 NOTE — Progress Notes (Signed)
   Subjective: 1 Day Post-Op Procedure(s) (LRB): RIGHT SECOND TOE AMPUTATION (Right) DOS: 06/30/2024  Objective: Vital signs in last 24 hours: Temp:  [98.3 F (36.8 C)-99.2 F (37.3 C)] 98.6 F (37 C) (08/21 0739) Pulse Rate:  [64-73] 64 (08/21 1508) Resp:  [16-17] 16 (08/21 1508) BP: (125-157)/(64-84) 125/64 (08/21 1508) SpO2:  [97 %-100 %] 100 % (08/21 1508)  Recent Labs    06/29/24 0625 06/30/24 0355 07/01/24 0430  HGB 11.0* 10.6* 10.4*   Recent Labs    06/30/24 0355 06/30/24 1644 07/01/24 0430  WBC 4.0  --  4.9  RBC 3.41* 3.84* 3.36*  HCT 32.9*  --  31.7*  PLT 203  --  174   Recent Labs    06/30/24 0355 07/01/24 0430  NA 137 137  K 5.0 4.8  CL 114* 112*  CO2 17* 18*  BUN 32* 34*  CREATININE 4.22* 4.33*  GLUCOSE 111* 98  CALCIUM  7.8* 8.0*   Recent Labs    06/29/24 0625  INR 0.9    Physical Exam: There is some dehiscence of the amputation site of the second toe secondary to excessive ambulation today with physical therapy. He had mentioned that the dressings were clean and dry without strikethrough prior to PT eval where he was walking up and down the hospital hallway.   After PT eval and ambulation he noticed strikethrough on the dressings and dressings were reinforced by nursing.   After removal of the dressings there is no active bleeding. There is some dehiscence noted to the most central portion of the incision site with busted sutures but overall appears stable and should heal by secondary intention.  Assessment/Plan: 1 Day Post-Op Procedure(s) (LRB): RIGHT SECOND TOE AMPUTATION (Right) DOS: 06/30/2024  -seen at bedside.  -dressings changed. Leave clean, dry, and intact until f/u in office. Rec f/u in office 07/09/2024. Will message our office schedulers to reach out to patient to make appt -MINIMAL WBAT in surgical shoe w/ assistance of crutches -rec discharge on broad spectrum abx x 7 days -from a podiatry standpoint okay for discharge.    -Podiatry will sign off  Thresa CHRISTELLA Sar 07/01/2024, 5:00 PM  Thresa EMERSON Sar, DPM [facility]  Dr. Thresa EMERSON Sar, DPM    [address] [place], [place] [address]                Office [phone]  Fax [phone]

## 2024-07-01 NOTE — Evaluation (Signed)
 Physical Therapy Evaluation and DIscharge Patient Details Name: Dwayne Rocha MRN: [id number] DOB: 10-Dec-1975 Today's Date: 07/01/2024  History of Present Illness  48 yo male presents to [facility] on 06/28/24 with R 2nd toe ulceration with osteomyelitis, underwent amputation 8/20.  PMH: anxiety, depression, DM2, peripheral neuropathy, GERD, CKD, HLD, HTN, OSA, onychomycosis, right great toe amp, L BKA  Clinical Impression  Patient evaluated by Physical Therapy with no further acute PT needs identified. All education has been completed and the patient has no further questions. Pt from home with kids where he is independent, drives, ambulates with L prosthesis without AD. Pt has RW and w/c and instructed to use RW for safety and pain control, esp for longer distances. Pt independent with donning of prosthesis and post op shoe as well as ambulation with and without RW. Pt screened for vestibular dysfunction as he has dizziness 06/29/24. Pt did not have dizziness today nor did it recur with mobility and changes in head position.  Pt has all needed equipment.  PT is signing off. Thank you for this referral.         If plan is discharge home, recommend the following:     Can travel by private vehicle        Equipment Recommendations None recommended by PT  Recommendations for Other Services       Functional Status Assessment Patient has not had a recent decline in their functional status     Precautions / Restrictions Precautions Precautions: Fall Recall of Precautions/Restrictions: Intact Required Braces or Orthoses: Other Brace Other Brace: R post op shoe, L prosthesis Restrictions Weight Bearing Restrictions Per Provider Order: No      Mobility  Bed Mobility Overal bed mobility: Independent             General bed mobility comments: pt sits up and dons L prosthesis and R post op shoe independently    Transfers Overall transfer level: Modified independent Equipment used:  None               General transfer comment: pt stands steadily EOB without UE support    Ambulation/Gait Ambulation/Gait assistance: Modified independent (Device/Increase time) Gait Distance (Feet): 200 Feet Assistive device: Rolling walker (2 wheels) Gait Pattern/deviations: Step-through pattern Gait velocity: WFL Gait velocity interpretation: >4.37 ft/sec, indicative of normal walking speed   General Gait Details: pt with smooth gait pattern with prosthesis, increased toe out RLE, expect this is his baseline. Pt able to ambulate without RW without LOB but instructed to use RW for safety and pain control for longer distances. Pt able to complete head turns and changes in direction without dizziness  Stairs            Wheelchair Mobility     Tilt Bed    Modified Rankin (Stroke Patients Only)       Balance Overall balance assessment: No apparent balance deficits (not formally assessed)                                           Pertinent Vitals/Pain Pain Assessment Pain Assessment: Faces Faces Pain Scale: Hurts a little bit Pain Location: R foot Pain Descriptors / Indicators: Sore Pain Intervention(s): Limited activity within patient's tolerance, Monitored during session    Home Living Family/patient expects to be discharged to:: Private residence Living Arrangements: Children Available Help at Discharge: Family Type of Home:  Apartment Home Access: Level entry       Home Layout: One level Home Equipment: Agricultural consultant (2 wheels);Wheelchair - manual Additional Comments: kids are 17, 15, 13    Prior Function Prior Level of Function : Independent/Modified Independent;Driving             Mobility Comments: walks without AD, uses prosthesis ADLs Comments: independent     Extremity/Trunk Assessment   Upper Extremity Assessment Upper Extremity Assessment: Overall WFL for tasks assessed    Lower Extremity Assessment Lower  Extremity Assessment: Overall WFL for tasks assessed    Cervical / Trunk Assessment Cervical / Trunk Assessment: Normal  Communication   Communication Communication: No apparent difficulties    Cognition Arousal: Alert Behavior During Therapy: WFL for tasks assessed/performed   PT - Cognitive impairments: No apparent impairments                         Following commands: Intact       Cueing Cueing Techniques: Verbal cues     General Comments General comments (skin integrity, edema, etc.): pt screened for vestibular dysfunction as he had dizziness 06/29/24. Dizziness has not returned since that day and pt had no signs of nystagmus or other vestibular impairment on eval    Exercises     Assessment/Plan    PT Assessment Patient does not need any further PT services  PT Problem List         PT Treatment Interventions      PT Goals (Current goals can be found in the Care Plan section)  Acute Rehab PT Goals Patient Stated Goal: return home today PT Goal Formulation: All assessment and education complete, DC therapy    Frequency       Co-evaluation               AM-PAC PT 6 Clicks Mobility  Outcome Measure Help needed turning from your back to your side while in a flat bed without using bedrails?: None Help needed moving from lying on your back to sitting on the side of a flat bed without using bedrails?: None Help needed moving to and from a bed to a chair (including a wheelchair)?: None Help needed standing up from a chair using your arms (e.g., wheelchair or bedside chair)?: None Help needed to walk in hospital room?: None Help needed climbing 3-5 steps with a railing? : None 6 Click Score: 24    End of Session   Activity Tolerance: Patient tolerated treatment well Patient left: in bed;with call bell/phone within reach Nurse Communication: Mobility status PT Visit Diagnosis: Pain Pain - Right/Left: Right Pain - part of body: Ankle and  joints of foot    Time: 9151-9089 PT Time Calculation (min) (ACUTE ONLY): 22 min   Charges:   PT Evaluation $PT Eval Low Complexity: 1 Low   PT General Charges $$ ACUTE PT VISIT: 1 Visit         Richerd Lipoma, PT  Acute Rehab Services Secure chat preferred Office [phone]   Richerd CROME Tywanda Rice 07/01/2024, 9:24 AM

## 2024-07-01 NOTE — Progress Notes (Signed)
 Orthopedic Tech Progress Note Patient Details:  Dwayne Rocha 1976/01/03 [id number]  Ortho Devices Type of Ortho Device: Postop shoe/boot Ortho Device/Splint Location: RLE Ortho Device/Splint Interventions: Ordered, Application, Adjustment   Post Interventions Patient Tolerated: Well Instructions Provided: Care of device, Adjustment of device  [name] [name] 07/01/2024, 6:36 PM

## 2024-07-01 NOTE — Plan of Care (Signed)
   Problem: Clinical Measurements: Goal: Will remain free from infection Outcome: Progressing   Problem: Pain Managment: Goal: General experience of comfort will improve and/or be controlled Outcome: Progressing   Problem: Safety: Goal: Ability to remain free from injury will improve Outcome: Progressing

## 2024-07-02 ENCOUNTER — Ambulatory Visit (HOSPITAL_BASED_OUTPATIENT_CLINIC_OR_DEPARTMENT_OTHER): Admitting: Physical Therapy

## 2024-07-03 LAB — CULTURE, BLOOD (ROUTINE X 2)
Culture: NO GROWTH
Culture: NO GROWTH
Special Requests: ADEQUATE

## 2024-07-05 ENCOUNTER — Ambulatory Visit (HOSPITAL_BASED_OUTPATIENT_CLINIC_OR_DEPARTMENT_OTHER): Admitting: Physical Therapy

## 2024-07-05 NOTE — Discharge Summary (Signed)
 Physician Discharge Summary   Patient: Dwayne Rocha MRN: [id number] DOB: 1976/06/25  Admit date:     06/28/2024  Discharge date: 07/01/2024  Discharge Physician: Elgie Butter   PCP: Shelda Atlas, MD   Recommendations at discharge:  Please follow with PCP in one week.  Please follow with cbc and bmp in one week.  Please follow up with podiatry as recommended.   Discharge Diagnoses: Principal Problem:   Osteomyelitis (HCC) Active Problems:   Osteomyelitis of second toe of right foot [facility])    Hospital Course: Dwayne Rocha is a 48 y.o. male with medical history significant of s/p L BKA, R great toe amputation, and HTN, HLD, DM2, and CKD p/w worsening RLE 2nd digit wound . XR R foot c/w OM and septic arthritis of RLE 2nd DIP. EDP requested medicine admission after consulted Podiatry who plans for RLE 2nd digit amputation.    Assessment and Plan:      RLE / 2nd digit wound/ osteomyelitis of the 2 nd toe: - podiatry consulted, underwent  2 nd toe amputation,.  - pain control.  - patient on IV linezolid  and IV cefepime . Discharging on oral antibiotics to complete the course.      Type 2 DM Resume home meds.  Resume SSI. A1c is 5.8% Resume home meds on discharge.    Hypertension Well controlled. Continue with metoprolol  and hydralazine .      Stage 4 CKD Monitor.  Check Creatinine in one week.    Hyperlipidemia Resume statin.    S/p L BKA     Anemia of chronic disease Hemoglobin around 10. Monitor.  Anemia panel reviewed.      Consultants: podiatry Procedures performed: 2nd toe amputation.   Disposition: Home Diet recommendation:  Discharge Diet Orders (From admission, onward)     Start     Ordered   07/01/24 0000  Diet - low sodium heart healthy        07/01/24 1758           Regular diet DISCHARGE MEDICATION: Allergies as of 07/01/2024       Reactions   Lyrica [pregabalin] Swelling   Oxycodone  Nausea Only        Medication List      STOP taking these medications    BLOOD GLUCOSE TEST STRIPS Strp   hydrochlorothiazide  25 MG tablet Commonly known as: HYDRODIURIL    linaclotide  145 MCG Caps capsule Commonly known as: Linzess    Olmesartan -amLODIPine -HCTZ 40-10-25 MG Tabs       TAKE these medications    acarbose  25 MG tablet Commonly known as: PRECOSE  Take 1 tablet (25 mg total) by mouth 3 (three) times daily with meals.   ADVANCED MULTI EA PO Take 1 tablet by mouth at bedtime.   amLODipine -olmesartan  10-40 MG tablet Commonly known as: AZOR Take 1 tablet by mouth daily.   amoxicillin -clavulanate 875-125 MG tablet Commonly known as: AUGMENTIN  Take 1 tablet by mouth 2 (two) times daily for 7 days.   desvenlafaxine 50 MG 24 hr tablet Commonly known as: PRISTIQ Take 50 mg by mouth every morning.   doxycycline  100 MG tablet Commonly known as: VIBRA -TABS Take 1 tablet (100 mg total) by mouth 2 (two) times daily for 7 days.   FreeStyle Libre 2 Sensor Misc Inject 1 Dose into the skin as directed.   FreeStyle Libre 14 Day Sensor Misc Apply topically.   FreeStyle Libre 2 Reader Devi Inject 1 Dose into the skin as directed.   Gvoke HypoPen  1-Pack 1 MG/0.2ML  Soaj Generic drug: Glucagon  Inject 1 mg into the skin as needed (low blood sugar with imaopired consciousness).   hydrALAZINE  100 MG tablet Commonly known as: APRESOLINE  Take 1 tablet (100 mg total) by mouth every 8 (eight) hours. What changed: when to take this   HYDROcodone -acetaminophen  10-325 MG tablet Commonly known as: NORCO Take 1 tablet by mouth every 6 (six) hours as needed for moderate pain (pain score 4-6).   Jardiance 25 MG Tabs tablet Generic drug: empagliflozin Take 25 mg by mouth daily.   metoprolol  tartrate 100 MG tablet Commonly known as: LOPRESSOR  Take 100 mg by mouth 2 (two) times daily.   pantoprazole  40 MG tablet Commonly known as: PROTONIX  Take 1 tablet (40 mg total) by mouth daily.   rosuvastatin  5 MG  tablet Commonly known as: CRESTOR  TAKE 1 TABLET(5 MG) BY MOUTH DAILY   Sunosi  75 MG Tabs Generic drug: Solriamfetol  HCl Take 1 tablet by mouth every morning.   traZODone  50 MG tablet Commonly known as: DESYREL  Take 50-100 mg by mouth at bedtime as needed.   Veltassa  8.4 g packet Generic drug: patiromer  Take 1 packet by mouth daily.   Vitamin D  50 MCG (2000 UT) tablet Take 2,000 Units by mouth daily.        Discharge Exam: Filed Weights   06/28/24 1531 06/30/24 1106  Weight: 122.5 kg 122.4 kg   General exam: Appears calm and comfortable  Respiratory system: Clear to auscultation. Respiratory effort normal. Cardiovascular system: S1 & S2 heard, RRR. Gastrointestinal system: Abdomen is nondistended, soft and nontender. Central nervous system: Alert and oriented. No focal neurological deficits. Extremities:2nd toe amputation.  Skin: No rashes,  Psychiatry: Mood & affect appropriate.    Condition at discharge: fair  The results of significant diagnostics from this hospitalization (including imaging, microbiology, ancillary and laboratory) are listed below for reference.   Imaging Studies: MR TOES RIGHT WO CONTRAST Result Date: 06/28/2024 CLINICAL DATA:  Diabetes, second toe infection, erosive findings on radiography EXAM: MRI OF THE RIGHT TOES WITHOUT CONTRAST TECHNIQUE: Multiplanar, multisequence MR imaging of the right foot from the Lisfranc joint through the toes was performed. No intravenous contrast was administered. COMPARISON:  Radiographs 06/28/2024 FINDINGS: Bones/Joint/Cartilage Prior amputation of the first toe at the midshaft proximal phalanx level without visible complicating feature. There is abnormal edema signal throughout the distal phalanx of the second toe. In corroboration with the demineralization of the tuft on radiography the appearance is compatible with acute osteomyelitis of these of distal phalanx second toe. Mild degenerative subcortical marrow edema  dorsally at the junction between the middle cuneiform and the second metatarsal base, and to a lesser extent dorsally along the medial cuneiform. Ligaments Lisfranc ligament intact. Muscles and Tendons Unremarkable Soft tissues Infiltrative subcutaneous edema dorsal to the distal metatarsals especially laterally as on image 27 series 8. Cutaneous thickening and subcutaneous edema distally in the second toe favoring cellulitis. IMPRESSION: 1. Acute osteomyelitis of the distal phalanx of the second toe. 2. Prior amputation of the first toe at the midshaft proximal phalanx level without visible complicating feature. 3. Infiltrative subcutaneous edema dorsal to the distal metatarsals especially laterally. 4. Cutaneous thickening and subcutaneous edema distally in the second toe favoring cellulitis. Electronically Signed   By: Ryan Salvage M.D.   On: 06/28/2024 18:31   DG Toe 2nd Right Result Date: 06/28/2024 CLINICAL DATA:  Diabetes, second digit infection EXAM: RIGHT SECOND TOE COMPARISON:  06/16/2024 FINDINGS: Frontal, oblique, and lateral views of the right second digit are obtained.  Diffuse soft tissue swelling of the second digit is again noted, with persistent erosive changes of the distal tuft of the second distal phalanx compatible with osteomyelitis. There is now erosive change surrounding the second distal interphalangeal joint and distal margin of the second middle phalanx, concerning for septic arthritis and progressive osteomyelitis. No subcutaneous gas or radiopaque foreign body. Prior first digit amputation at the level of the first proximal phalanx. IMPRESSION: 1. Erosive changes within the second middle and distal phalanges as above, consistent with progressive osteomyelitis and suspected septic arthritis of the second distal interphalangeal joint. 2. Marked soft tissue swelling throughout the second digit. No subcutaneous gas or radiopaque foreign body. Electronically Signed   By: Ozell Daring M.D.   On: 06/28/2024 16:12   DG Foot Complete Right Result Date: 06/16/2024 Please see detailed radiograph report in office note.   Microbiology: Results for orders placed or performed during the hospital encounter of 06/28/24  Blood culture (routine x 2)     Status: None   Collection Time: 06/28/24  3:50 PM   Specimen: BLOOD  Result Value Ref Range Status   Specimen Description BLOOD RIGHT ANTECUBITAL  Final   Special Requests   Final    BOTTLES DRAWN AEROBIC AND ANAEROBIC Blood Culture adequate volume   Culture   Final    NO GROWTH 5 DAYS Performed at [facility], [address], [place], [place] [address]    Report Status 07/03/2024 FINAL  Final  Blood culture (routine x 2)     Status: None   Collection Time: 06/28/24  4:34 PM   Specimen: BLOOD  Result Value Ref Range Status   Specimen Description BLOOD SITE NOT SPECIFIED  Final   Special Requests   Final    BOTTLES DRAWN AEROBIC AND ANAEROBIC Blood Culture results may not be optimal due to an inadequate volume of blood received in culture bottles   Culture   Final    NO GROWTH 5 DAYS Performed at [facility], [address], [place], [place] [address]    Report Status 07/03/2024 FINAL  Final    Labs: CBC: Recent Labs  Lab 06/29/24 0625 06/30/24 0355 07/01/24 0430  WBC 4.2 4.0 4.9  NEUTROABS  --  2.3 3.4  HGB 11.0* 10.6* 10.4*  HCT 33.8* 32.9* 31.7*  MCV 95.5 96.5 94.3  PLT 183 203 174   Basic Metabolic Panel: Recent Labs  Lab 06/29/24 0625 06/30/24 0355 07/01/24 0430  NA 139 137 137  K 4.3 5.0 4.8  CL 117* 114* 112*  CO2 18* 17* 18*  GLUCOSE 77 111* 98  BUN 33* 32* 34*  CREATININE 3.97* 4.22* 4.33*  CALCIUM  7.9* 7.8* 8.0*  MG 1.9  --   --   PHOS 4.8*  --   --    Liver Function Tests: No results for input(s): AST, ALT, ALKPHOS, BILITOT, PROT, ALBUMIN in the last 168 hours. CBG: Recent Labs  Lab 06/30/24 1524 06/30/24 2134 07/01/24 0648 07/01/24 1159  07/01/24 1645  GLUCAP 73 134* 129* 122* 114*    Discharge time spent: 39 minutes.   Signed: Viveka Wilmeth, MD [facility]

## 2024-07-06 ENCOUNTER — Ambulatory Visit: Admitting: Podiatry

## 2024-07-07 ENCOUNTER — Encounter: Payer: Self-pay | Admitting: Podiatry

## 2024-07-07 ENCOUNTER — Encounter (HOSPITAL_BASED_OUTPATIENT_CLINIC_OR_DEPARTMENT_OTHER): Payer: Self-pay | Admitting: Physical Therapy

## 2024-07-07 ENCOUNTER — Ambulatory Visit (HOSPITAL_BASED_OUTPATIENT_CLINIC_OR_DEPARTMENT_OTHER): Admitting: Physical Therapy

## 2024-07-07 ENCOUNTER — Ambulatory Visit (INDEPENDENT_AMBULATORY_CARE_PROVIDER_SITE_OTHER)

## 2024-07-07 ENCOUNTER — Ambulatory Visit (INDEPENDENT_AMBULATORY_CARE_PROVIDER_SITE_OTHER): Admitting: Podiatry

## 2024-07-07 VITALS — Ht 70.0 in | Wt 269.0 lb

## 2024-07-07 DIAGNOSIS — E08621 Diabetes mellitus due to underlying condition with foot ulcer: Secondary | ICD-10-CM

## 2024-07-07 DIAGNOSIS — L97514 Non-pressure chronic ulcer of other part of right foot with necrosis of bone: Secondary | ICD-10-CM | POA: Diagnosis not present
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
## 2024-07-09 ENCOUNTER — Ambulatory Visit (HOSPITAL_BASED_OUTPATIENT_CLINIC_OR_DEPARTMENT_OTHER): Admitting: Physical Therapy

## 2024-07-14 ENCOUNTER — Encounter: Payer: Self-pay | Admitting: Podiatry

## 2024-07-14 ENCOUNTER — Ambulatory Visit (INDEPENDENT_AMBULATORY_CARE_PROVIDER_SITE_OTHER): Admitting: Podiatry

## 2024-07-14 ENCOUNTER — Ambulatory Visit (HOSPITAL_BASED_OUTPATIENT_CLINIC_OR_DEPARTMENT_OTHER): Admitting: Physical Therapy

## 2024-07-14 VITALS — Ht 70.0 in | Wt 269.0 lb

## 2024-07-14 DIAGNOSIS — E08621 Diabetes mellitus due to underlying condition with foot ulcer: Secondary | ICD-10-CM

## 2024-07-14 DIAGNOSIS — L97514 Non-pressure chronic ulcer of other part of right foot with necrosis of bone: Secondary | ICD-10-CM
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
## 2024-07-16 ENCOUNTER — Ambulatory Visit (HOSPITAL_BASED_OUTPATIENT_CLINIC_OR_DEPARTMENT_OTHER): Admitting: Physical Therapy

## 2024-07-22 ENCOUNTER — Other Ambulatory Visit: Payer: Self-pay

## 2024-07-22 DIAGNOSIS — N1831 Chronic kidney disease, stage 3a: Secondary | ICD-10-CM

## 2024-07-26 ENCOUNTER — Ambulatory Visit: Admitting: Podiatry

## 2024-07-28 ENCOUNTER — Ambulatory Visit (INDEPENDENT_AMBULATORY_CARE_PROVIDER_SITE_OTHER): Admitting: Podiatry

## 2024-07-28 ENCOUNTER — Encounter: Payer: Self-pay | Admitting: Podiatry

## 2024-07-28 VITALS — Ht 70.0 in | Wt 269.0 lb

## 2024-07-28 DIAGNOSIS — E08621 Diabetes mellitus due to underlying condition with foot ulcer: Secondary | ICD-10-CM

## 2024-07-28 DIAGNOSIS — L97514 Non-pressure chronic ulcer of other part of right foot with necrosis of bone: Secondary | ICD-10-CM

## 2024-07-28 MED ORDER — DOXYCYCLINE HYCLATE 100 MG PO TABS
100.0000 mg | ORAL_TABLET | Freq: Two times a day (BID) | ORAL | 0 refills | Status: DC
Start: 2024-07-28 — End: 2024-09-01
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
## 2024-07-30 ENCOUNTER — Other Ambulatory Visit: Payer: Self-pay | Admitting: "Endocrinology

## 2024-07-30 DIAGNOSIS — K912 Postsurgical malabsorption, not elsewhere classified: Secondary | ICD-10-CM

## 2024-07-30 NOTE — Telephone Encounter (Signed)
 Requested Prescriptions   Pending Prescriptions Disp Refills   acarbose  (PRECOSE ) 25 MG tablet [Pharmacy Med Name: ACARBOSE  25MG  TABLETS] 90 tablet 1    Sig: TAKE 1 TABLET(25 MG) BY MOUTH THREE TIMES DAILY WITH MEALS.

## 2024-08-11 ENCOUNTER — Encounter: Payer: Self-pay | Admitting: Podiatry

## 2024-08-11 ENCOUNTER — Ambulatory Visit (INDEPENDENT_AMBULATORY_CARE_PROVIDER_SITE_OTHER): Admitting: Podiatry

## 2024-08-11 ENCOUNTER — Ambulatory Visit (INDEPENDENT_AMBULATORY_CARE_PROVIDER_SITE_OTHER)

## 2024-08-11 VITALS — Ht 70.0 in | Wt 269.0 lb

## 2024-08-11 DIAGNOSIS — E08621 Diabetes mellitus due to underlying condition with foot ulcer: Secondary | ICD-10-CM | POA: Diagnosis not present

## 2024-08-11 DIAGNOSIS — L97514 Non-pressure chronic ulcer of other part of right foot with necrosis of bone: Secondary | ICD-10-CM
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
## 2024-08-20 ENCOUNTER — Ambulatory Visit (HOSPITAL_COMMUNITY)
Admission: RE | Admit: 2024-08-20 | Discharge: 2024-08-20 | Disposition: A | Discharge status: Home / Self Care | Source: Ambulatory Visit | Attending: Vascular Surgery | Admitting: Vascular Surgery

## 2024-08-20 ENCOUNTER — Other Ambulatory Visit: Payer: Self-pay

## 2024-08-20 ENCOUNTER — Ambulatory Visit: Admitting: Vascular Surgery

## 2024-08-20 ENCOUNTER — Ambulatory Visit (HOSPITAL_BASED_OUTPATIENT_CLINIC_OR_DEPARTMENT_OTHER)
Admission: RE | Admit: 2024-08-20 | Discharge: 2024-08-20 | Disposition: A | Discharge status: Home / Self Care | Source: Ambulatory Visit | Attending: Vascular Surgery | Admitting: Vascular Surgery

## 2024-08-20 ENCOUNTER — Encounter: Payer: Self-pay | Admitting: Vascular Surgery

## 2024-08-20 VITALS — BP 153/94 | HR 76 | Temp 98.0°F | Resp 18 | Ht 70.0 in | Wt 259.0 lb

## 2024-08-20 DIAGNOSIS — N185 Chronic kidney disease, stage 5: Secondary | ICD-10-CM | POA: Insufficient documentation

## 2024-08-20 DIAGNOSIS — N1831 Chronic kidney disease, stage 3a: Secondary | ICD-10-CM

## 2024-08-20 DIAGNOSIS — N186 End stage renal disease: Secondary | ICD-10-CM

## 2024-08-25 ENCOUNTER — Ambulatory Visit (INDEPENDENT_AMBULATORY_CARE_PROVIDER_SITE_OTHER): Admitting: Podiatry

## 2024-08-25 ENCOUNTER — Encounter: Payer: Self-pay | Admitting: Podiatry

## 2024-08-25 VITALS — Ht 70.0 in | Wt 259.0 lb

## 2024-08-25 DIAGNOSIS — L97514 Non-pressure chronic ulcer of other part of right foot with necrosis of bone: Secondary | ICD-10-CM | POA: Diagnosis not present

## 2024-08-25 DIAGNOSIS — E08621 Diabetes mellitus due to underlying condition with foot ulcer: Secondary | ICD-10-CM
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
## 2024-08-31 ENCOUNTER — Encounter (HOSPITAL_COMMUNITY): Payer: Self-pay | Admitting: Vascular Surgery

## 2024-09-01 ENCOUNTER — Other Ambulatory Visit: Payer: Self-pay

## 2024-09-01 ENCOUNTER — Encounter (HOSPITAL_COMMUNITY): Payer: Self-pay | Admitting: Vascular Surgery

## 2024-09-01 NOTE — Progress Notes (Addendum)
 PCP - Shelda Atlas, MD  Cardiologist -    PPM/ICD - denies Device Orders - n/a Rep Notified - n/a  Chest x-ray - denies EKG - DOS Stress Test - 04-03-20 ECHO - 03-15-20 Cardiac Cath - denies  CPAP - denies  GLP-1 -denies  Fasting Blood Sugar - per patient blood sugar fasting runs low at 50-66. Blood sugar this am 66. Checks Blood Sugar FreeStyle Libre right arn empagliflozin (JARDIANCE) Last dose- 08-29-24  Blood Thinner Instructions: denies Aspirin  Instructions: denies  ERAS Protcol - NPO  COVID TEST- n/a  Anesthesia review: yes, Hx of Htn, Dm  Patient verbally denies any shortness of breath, fever, cough and chest pain during phone call   -------------  SDW INSTRUCTIONS given:  Your procedure is scheduled on October 23,2025.  Report to [facility] Main Entrance A at 7:00 A.M., and check in at the Admitting office.  Call this number if you have problems the morning of surgery:  [phone]   Remember:  Do not eat or drink after midnight the night before your surgery      Take these medicines the morning of surgery with A SIP OF WATER  amantadine  desvenlafaxine (PRISTIQ)  HYDROcodone -acetaminophen  (NORCO)  metoprolol  tartrate (LOPRESSOR )  rosuvastatin  (CRESTOR )    WHAT DO I DO ABOUT MY DIABETES MEDICATION?   Do not take oral diabetes medicines empagliflozin (JARDIANCE) acarbose  (PRECOSE ) (pills) the morning of surgery.  Last dose of jardiance 08-29-24  The day of surgery, do not take other diabetes injectables, including Byetta (exenatide), Bydureon (exenatide ER), Victoza (liraglutide), or Trulicity (dulaglutide).  If your CBG is greater than 220 mg/dL, you may take  of your sliding scale (correction) dose of insulin .   HOW TO MANAGE YOUR DIABETES BEFORE AND AFTER SURGERY  Why is it important to control my blood sugar before and after surgery? Improving blood sugar levels before and after surgery helps healing and can limit problems. A way  of improving blood sugar control is eating a healthy diet by:  Eating less sugar and carbohydrates  Increasing activity/exercise  Talking with your doctor about reaching your blood sugar goals High blood sugars (greater than 180 mg/dL) can raise your risk of infections and slow your recovery, so you will need to focus on controlling your diabetes during the weeks before surgery. Make sure that the doctor who takes care of your diabetes knows about your planned surgery including the date and location.  How do I manage my blood sugar before surgery? Check your blood sugar at least 4 times a day, starting 2 days before surgery, to make sure that the level is not too high or low.  Check your blood sugar the morning of your surgery when you wake up and every 2 hours until you get to the Short Stay unit.  If your blood sugar is less than 70 mg/dL, you will need to treat for low blood sugar: Do not take insulin . Treat a low blood sugar (less than 70 mg/dL) with  cup of clear juice (cranberry or apple), 4 glucose tablets, OR glucose gel. Recheck blood sugar in 15 minutes after treatment (to make sure it is greater than 70 mg/dL). If your blood sugar is not greater than 70 mg/dL on recheck, call [phone] for further instructions. Report your blood sugar to the short stay nurse when you get to Short Stay.  If you are admitted to the hospital after surgery: Your blood sugar will be checked by the staff and you will probably  be given insulin  after surgery (instead of oral diabetes medicines) to make sure you have good blood sugar levels. The goal for blood sugar control after surgery is 80-180 mg/dL.  As of today, STOP taking any Aspirin  (unless otherwise instructed by your surgeon) Aleve, Naproxen, Ibuprofen , Motrin , Advil , Goody's, BC's, all herbal medications, fish oil, and all vitamins.                      Do not wear jewelry, make up, or nail polish            Do not wear lotions, powders,  perfumes/colognes, or deodorant.            Do not shave 48 hours prior to surgery.  Men may shave face and neck.            Do not bring valuables to the hospital.            [facility] is not responsible for any belongings or valuables.  Do NOT Smoke (Tobacco/Vaping) 24 hours prior to your procedure If you use a CPAP at night, you may bring all equipment for your overnight stay.   Contacts, glasses, dentures or bridgework may not be worn into surgery.      For patients admitted to the hospital, discharge time will be determined by your treatment team.   Patients discharged the day of surgery will not be allowed to drive home, and someone needs to stay with them for 24 hours.    Special instructions:   [facility]- Preparing For Surgery  Before surgery, you can play an important role. Because skin is not sterile, your skin needs to be as free of germs as possible. You can reduce the number of germs on your skin by washing with CHG (chlorahexidine gluconate) Soap before surgery.  CHG is an antiseptic cleaner which kills germs and bonds with the skin to continue killing germs even after washing.    Oral Hygiene is also important to reduce your risk of infection.  Remember - BRUSH YOUR TEETH THE MORNING OF SURGERY WITH YOUR REGULAR TOOTHPASTE  Please do not use if you have an allergy to CHG or antibacterial soaps. If your skin becomes reddened/irritated stop using the CHG.  Do not shave (including legs and underarms) for at least 48 hours prior to first CHG shower. It is OK to shave your face.  Please follow these instructions carefully.   Shower the NIGHT BEFORE SURGERY and the MORNING OF SURGERY with DIAL  Soap.   Pat yourself dry with a CLEAN TOWEL.  Wear CLEAN PAJAMAS to bed the night before surgery  Place CLEAN SHEETS on your bed the night of your first shower and DO NOT SLEEP WITH PETS.   Day of Surgery: Please shower morning of surgery  Wear Clean/Comfortable clothing the  morning of surgery Do not apply any deodorants/lotions.   Remember to brush your teeth WITH YOUR REGULAR TOOTHPASTE.   Questions were answered. Patient verbalized understanding of instructions.
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
## 2024-09-02 ENCOUNTER — Encounter (HOSPITAL_COMMUNITY)
Admission: RE | Disposition: A | Discharge status: Home / Self Care | Payer: Self-pay | Source: Home / Self Care | Attending: Vascular Surgery

## 2024-09-02 ENCOUNTER — Encounter (HOSPITAL_COMMUNITY): Payer: Self-pay | Admitting: Vascular Surgery

## 2024-09-02 ENCOUNTER — Ambulatory Visit (HOSPITAL_COMMUNITY)
Admission: RE | Admit: 2024-09-02 | Discharge: 2024-09-02 | Disposition: A | Discharge status: Home / Self Care | Attending: Vascular Surgery | Admitting: Vascular Surgery

## 2024-09-02 ENCOUNTER — Ambulatory Visit (HOSPITAL_COMMUNITY): Admitting: Vascular Surgery

## 2024-09-02 ENCOUNTER — Other Ambulatory Visit: Payer: Self-pay

## 2024-09-02 DIAGNOSIS — Z89421 Acquired absence of other right toe(s): Secondary | ICD-10-CM | POA: Diagnosis not present

## 2024-09-02 DIAGNOSIS — K219 Gastro-esophageal reflux disease without esophagitis: Secondary | ICD-10-CM | POA: Diagnosis not present

## 2024-09-02 DIAGNOSIS — E1122 Type 2 diabetes mellitus with diabetic chronic kidney disease: Secondary | ICD-10-CM

## 2024-09-02 DIAGNOSIS — Z87891 Personal history of nicotine dependence: Secondary | ICD-10-CM | POA: Diagnosis not present

## 2024-09-02 DIAGNOSIS — G4730 Sleep apnea, unspecified: Secondary | ICD-10-CM | POA: Diagnosis not present

## 2024-09-02 DIAGNOSIS — I120 Hypertensive chronic kidney disease with stage 5 chronic kidney disease or end stage renal disease: Secondary | ICD-10-CM

## 2024-09-02 DIAGNOSIS — N186 End stage renal disease: Secondary | ICD-10-CM | POA: Insufficient documentation

## 2024-09-02 DIAGNOSIS — Z89512 Acquired absence of left leg below knee: Secondary | ICD-10-CM | POA: Diagnosis not present

## 2024-09-02 DIAGNOSIS — Z7984 Long term (current) use of oral hypoglycemic drugs: Secondary | ICD-10-CM | POA: Diagnosis not present

## 2024-09-02 DIAGNOSIS — N185 Chronic kidney disease, stage 5: Secondary | ICD-10-CM

## 2024-09-02 HISTORY — DX: Anemia, unspecified: D64.9

## 2024-09-02 LAB — POCT I-STAT, CHEM 8
BUN: 36 mg/dL — ABNORMAL HIGH (ref 6–20)
Calcium, Ion: 1.22 mmol/L (ref 1.15–1.40)
Chloride: 114 mmol/L — ABNORMAL HIGH (ref 98–111)
Creatinine, Ser: 4.5 mg/dL — ABNORMAL HIGH (ref 0.61–1.24)
Glucose, Bld: 89 mg/dL (ref 70–99)
HCT: 37 % — ABNORMAL LOW (ref 39.0–52.0)
Hemoglobin: 12.6 g/dL — ABNORMAL LOW (ref 13.0–17.0)
Potassium: 5.6 mmol/L — ABNORMAL HIGH (ref 3.5–5.1)
Sodium: 141 mmol/L (ref 135–145)
TCO2: 17 mmol/L — ABNORMAL LOW (ref 22–32)

## 2024-09-02 LAB — GLUCOSE, CAPILLARY
Glucose-Capillary: 89 mg/dL (ref 70–99)
Glucose-Capillary: 78 mg/dL (ref 70–99)
Glucose-Capillary: 86 mg/dL (ref 70–99)

## 2024-09-02 SURGERY — INSERTION, GRAFT, ARTERIOVENOUS, UPPER EXTREMITY
Anesthesia: Monitor Anesthesia Care | Site: Arm Upper | Laterality: Left

## 2024-09-02 MED ORDER — LIDOCAINE HCL (PF) 2 % IJ SOLN
INTRAMUSCULAR | Status: DC | PRN
  Administered 2024-09-02: 20 mL via PERINEURAL

## 2024-09-02 MED ORDER — HEMOSTATIC AGENTS (NO CHARGE) OPTIME
TOPICAL | Status: DC | PRN
  Administered 2024-09-02: 1 via TOPICAL

## 2024-09-02 MED ORDER — CHLORHEXIDINE GLUCONATE 4 % EX SOLN: 60.0000 mL | Freq: Once | CUTANEOUS | Status: DC

## 2024-09-02 MED ORDER — CHLORHEXIDINE GLUCONATE 0.12 % MT SOLN
15.0000 mL | Freq: Once | OROMUCOSAL | Status: AC
  Administered 2024-09-02: 15 mL via OROMUCOSAL
  Filled 2024-09-02: qty 15

## 2024-09-02 MED ORDER — HEPARIN 6000 UNIT IRRIGATION SOLUTION
Status: AC
  Filled 2024-09-02: qty 500

## 2024-09-02 MED ORDER — HEPARIN SODIUM (PORCINE) 1000 UNIT/ML IJ SOLN
INTRAMUSCULAR | Status: DC | PRN
  Administered 2024-09-02: 3000 [IU] via INTRAVENOUS

## 2024-09-02 MED ORDER — 0.9 % SODIUM CHLORIDE (POUR BTL) OPTIME
TOPICAL | Status: DC | PRN
  Administered 2024-09-02: 1000 mL

## 2024-09-02 MED ORDER — ORAL CARE MOUTH RINSE: 15.0000 mL | Freq: Once | OROMUCOSAL | Status: AC

## 2024-09-02 MED ORDER — EPHEDRINE SULFATE-NACL 50-0.9 MG/10ML-% IV SOSY
PREFILLED_SYRINGE | INTRAVENOUS | Status: DC | PRN
  Administered 2024-09-02: 10 mg via INTRAVENOUS
  Administered 2024-09-02: 15 mg via INTRAVENOUS

## 2024-09-02 MED ORDER — MIDAZOLAM HCL (PF) 2 MG/2ML IJ SOLN: 2.0000 mg | Freq: Once | INTRAMUSCULAR | Status: AC

## 2024-09-02 MED ORDER — FENTANYL CITRATE (PF) 100 MCG/2ML IJ SOLN
INTRAMUSCULAR | Status: DC | PRN
  Administered 2024-09-02: 75 ug via INTRAVENOUS
  Administered 2024-09-02: 25 ug via INTRAVENOUS

## 2024-09-02 MED ORDER — PROPOFOL 10 MG/ML IV BOLUS
INTRAVENOUS | Status: AC
  Filled 2024-09-02: qty 20

## 2024-09-02 MED ORDER — FENTANYL CITRATE (PF) 100 MCG/2ML IJ SOLN
INTRAMUSCULAR | Status: AC
  Filled 2024-09-02: qty 2

## 2024-09-02 MED ORDER — MIDAZOLAM HCL 2 MG/2ML IJ SOLN
INTRAMUSCULAR | Status: AC
  Administered 2024-09-02: 2 mg via INTRAVENOUS
  Filled 2024-09-02: qty 2

## 2024-09-02 MED ORDER — PROPOFOL 500 MG/50ML IV EMUL
INTRAVENOUS | Status: DC | PRN
  Administered 2024-09-02: 125 ug/kg/min via INTRAVENOUS

## 2024-09-02 MED ORDER — SODIUM CHLORIDE 0.9 % IV SOLN: INTRAVENOUS | Status: DC

## 2024-09-02 MED ORDER — HYDROCODONE-ACETAMINOPHEN 5-325 MG PO TABS: 1.0000 | ORAL_TABLET | Freq: Four times a day (QID) | ORAL | 0 refills | Status: AC | PRN

## 2024-09-02 MED ORDER — CEFAZOLIN SODIUM-DEXTROSE 3-4 GM/150ML-% IV SOLN
3.0000 g | INTRAVENOUS | Status: AC
  Administered 2024-09-02: 3 g via INTRAVENOUS
  Filled 2024-09-02: qty 150

## 2024-09-02 MED ORDER — HEPARIN 6000 UNIT IRRIGATION SOLUTION
Status: DC | PRN
  Administered 2024-09-02: 1

## 2024-09-02 SURGICAL SUPPLY — 25 items
ARMBAND PINK RESTRICT EXTREMIT (MISCELLANEOUS) ×1 IMPLANT
BAG COUNTER SPONGE SURGICOUNT (BAG) ×1 IMPLANT
CANISTER SUCTION 3000ML PPV (SUCTIONS) ×1 IMPLANT
CLIP TI MEDIUM 6 (CLIP) ×1 IMPLANT
CLIP TI WIDE RED SMALL 6 (CLIP) ×1 IMPLANT
COVER PROBE W GEL 5X96 (DRAPES) IMPLANT
DERMABOND ADVANCED .7 DNX12 (GAUZE/BANDAGES/DRESSINGS) ×1 IMPLANT
ELECTRODE REM PT RTRN 9FT ADLT (ELECTROSURGICAL) ×1 IMPLANT
GLOVE BIOGEL PI IND STRL 7.0 (GLOVE) ×1 IMPLANT
GOWN STRL REUS W/ TWL LRG LVL3 (GOWN DISPOSABLE) ×2 IMPLANT
GOWN STRL REUS W/ TWL XL LVL3 (GOWN DISPOSABLE) ×1 IMPLANT
HEMOSTAT SNOW SURGICEL 2X4 (HEMOSTASIS) IMPLANT
KIT BASIN OR (CUSTOM PROCEDURE TRAY) ×1 IMPLANT
KIT TURNOVER KIT B (KITS) ×1 IMPLANT
PACK CV ACCESS (CUSTOM PROCEDURE TRAY) ×1 IMPLANT
PAD ARMBOARD POSITIONER FOAM (MISCELLANEOUS) ×2 IMPLANT
SLING ARM FOAM STRAP LRG (SOFTGOODS) IMPLANT
SOLN 0.9% NACL POUR BTL 1000ML (IV SOLUTION) ×1 IMPLANT
SOLN STERILE WATER BTL 1000 ML (IV SOLUTION) ×1 IMPLANT
SUT MNCRL AB 4-0 PS2 18 (SUTURE) ×1 IMPLANT
SUT PROLENE 6 0 BV (SUTURE) ×1 IMPLANT
SUT VIC AB 3-0 SH 27X BRD (SUTURE) ×1 IMPLANT
SYR 20ML LL LF (SYRINGE) IMPLANT
TOWEL GREEN STERILE (TOWEL DISPOSABLE) ×1 IMPLANT
UNDERPAD 30X36 HEAVY ABSORB (UNDERPADS AND DIAPERS) ×1 IMPLANT

## 2024-09-02 NOTE — Transfer of Care (Signed)
 Immediate Anesthesia Transfer of Care Note  Patient: Dwayne Rocha  Procedure(s) Performed: LEFT BRACHIOCEPHALIC ARTERIOVENOUS FISTULA CREATION (Left: Arm Upper)  Patient Location: PACU  Anesthesia Type:Regional  Level of Consciousness: awake, alert , and oriented  Airway & Oxygen Therapy: Patient Spontanous Breathing and Patient connected to nasal cannula oxygen  Post-op Assessment: Report given to RN and Post -op Vital signs reviewed and stable  Post vital signs: Reviewed and stable  Last Vitals:  Vitals Value Taken Time  BP 99/62 09/02/24 11:26  Temp    Pulse 63 09/02/24 11:28  Resp 10 09/02/24 11:28  SpO2 100 % 09/02/24 11:28  Vitals shown include unfiled device data.  Last Pain:  Vitals:   09/02/24 0947  TempSrc:   PainSc: 0-No pain      Patients Stated Pain Goal: 0 (09/02/24 0947)  Complications: No notable events documented.

## 2024-09-02 NOTE — Op Note (Signed)
    OPERATIVE NOTE  PROCEDURE:   Intraoperative left arm vein mapping left arm brachiocephalic AVF creation  PRE-OPERATIVE DIAGNOSIS: CKD stage V  POST-OPERATIVE DIAGNOSIS: same as above   SURGEON: Norman GORMAN Serve MD  ASSISTANT(S): Curry Damme, PA  Given the complexity of the case,  the assistant was necessary in order to expedient the procedure and safely perform the technical aspects of the operation.  The assistant provided traction and countertraction to assist with exposure of the artery and vein.  They also assisted with suture ligation of multiple venous branches.  They played a critical role in the anastomosis. These skills, especially following the Prolene suture for the anastomosis, could not have been adequately performed by a scrub tech assistant.   ANESTHESIA: regional  ESTIMATED BLOOD LOSS: 10 cc  FINDING(S): Intraoperative vein mapping did not correlate with the preoperative vein mapping and there was a large cephalic vein in the left arm through the [facility] fossa.  It was slightly smaller in the forearm and therefore we elected to perform a brachiocephalic. Sufficiently sized left brachial artery Palpable and doppler thrill in AVF with multiphasic radial artery on completion  SPECIMEN(S):  none  INDICATIONS:   Dwayne Rocha is a 48 y.o. male with CKD stage V. The patient is not currently on dialysis. They were seen in the office for evaluation of hemodialysis access. The risks and benefits of access creation were reviewed including: need for additional procedures, need for additional creations, steal, ischemia monomelic neuropathy, failure of access, and bleeding. The patient expressed understand and is willing to proceed.    DESCRIPTION: The patient was brought to the operating room positioned supine on the operating table.  The left arm was prepped and draped in usual sterile fashion.  Timeout was performed and preoperative antibiotics were administered.  The  case began with ultrasound mapping of the brachial artery and cephalic vein, which demonstrated sufficient size at the antecubital fossa for arteriovenous fistula.  A transverse incision was made 1 finger breadth below the elbow creese in the antecubital fossa. The  cephalic vein was isolated for 3 cm in length. Next the aponeurosis was partially released and the brachial artery secured with a vessel loop. The patient was heparinized. The cephalic vein was transected and ligated distally with a 2-0 silk. The vein was dilated and flushed with heparin  saline. Vascular clamps were placed proximally and distally on the brachial artery and an approximate 6 mm arteriotomy was created on the brachial artery. This was flushed with heparin  saline.  An anastomosis was created in end to side fashion on the brachial artery using running 6-0 Prolene suture. Prior to completing the anastomosis, the vessels were flushed and the suture line was tied down. There was an excellent thrill in the cephalic vein from the anastomosis to the mid upper arm. The patient had a multiphasic radial signal and had an excellent doppler signal in the fistula. The incision was irrigated and hemostasis achieved with cautery and suture. The deeper tissue was closed with 3-0 Vicryl and the skin closed with 4-0 Monocryl. Dermabond was applied to the incisions. The patient was transferred to PACU in stable condition.  COMPLICATIONS: none apparent  CONDITION: stable  Norman GORMAN Serve MD [facility] Phone Number: [phone] 09/02/2024 11:12 AM

## 2024-09-02 NOTE — Anesthesia Postprocedure Evaluation (Signed)
 Anesthesia Post Note  Patient: Dwayne Rocha  Procedure(s) Performed: LEFT BRACHIOCEPHALIC ARTERIOVENOUS FISTULA CREATION (Left: Arm Upper)     Patient location during evaluation: PACU Anesthesia Type: MAC Level of consciousness: awake and alert Pain management: pain level controlled Vital Signs Assessment: post-procedure vital signs reviewed and stable Respiratory status: spontaneous breathing, nonlabored ventilation, respiratory function stable and patient connected to nasal cannula oxygen Cardiovascular status: stable and blood pressure returned to baseline Postop Assessment: no apparent nausea or vomiting Anesthetic complications: no   No notable events documented.  Last Vitals:  Vitals:   09/02/24 1130 09/02/24 1145  BP: 101/63 133/84  Pulse: 63   Resp: 11 11  Temp:    SpO2: 100%     Last Pain:  Vitals:   09/02/24 1130  TempSrc:   PainSc: Asleep                 Rome Ade

## 2024-09-02 NOTE — Interval H&P Note (Signed)
 History and Physical Interval Note:  09/02/2024 7:49 AM  Dwayne Rocha  has presented today for surgery, with the diagnosis of ESRD.  The various methods of treatment have been discussed with the patient and family. After consideration of risks, benefits and other options for treatment, the patient has consented to  Procedure(s): INSERTION, GRAFT, ARTERIOVENOUS, UPPER EXTREMITY (Left) as a surgical intervention.  The patient's history has been reviewed, patient examined, no change in status, stable for surgery.  I have reviewed the patient's chart and labs.  Questions were answered to the patient's satisfaction.     Norman GORMAN Serve

## 2024-09-02 NOTE — Progress Notes (Signed)
 Dr. Boone made aware of today's ISTAT result. Potassium 5.6. Ok per Dr. Boone. No new orders received.

## 2024-09-02 NOTE — Anesthesia Procedure Notes (Addendum)
 Anesthesia Regional Block: Supraclavicular block   Pre-Anesthetic Checklist: , timeout performed,  Correct Patient, Correct Site, Correct Laterality,  Correct Procedure, Correct Position, site marked,  Risks and benefits discussed,  Surgical consent,  Pre-op evaluation,  At surgeon's request and post-op pain management  Laterality: Left  Prep: chloraprep       Needles:  Injection technique: Single-shot  Needle Type: Echogenic Needle     Needle Length: 4cm  Needle Gauge: 25     Additional Needles:   Procedures:,,,, ultrasound used (permanent image in chart),,    Narrative:  Start time: 09/02/2024 9:20 AM End time: 09/02/2024 9:23 AM Injection made incrementally with aspirations every 5 mL.  Performed by: Personally  Anesthesiologist: Boone Fess, MD  Additional Notes: Patient's chart reviewed and they were deemed appropriate candidate for procedure, per surgeon's request. Patient educated about risks, benefits, and alternatives of the block including but not limited to: temporary or permanent nerve damage, hemidiaphragmatic paralysis leading to dyspnea, pneumothorax, bleeding, infection, damage to surround tissues, block failure, local anesthetic toxicity. Patient expressed understanding. A formal time-out was conducted consistent with institution rules.  Monitors were applied, and minimal sedation used (see nursing record). The site was prepped with skin prep and allowed to dry, and sterile gloves were used. A high frequency linear ultrasound probe with probe cover was utilized throughout. Supraclavicular artery visualized, along with the brachial plexus adjacent to it and appeared anatomically normal. Local anesthetic injected around the plexus, and echogenic block needle trajectory was monitored throughout. Aspiration performed every 5ml. Lung and blood vessels were avoided. All injections were performed without resistance and free of blood and paresthesias. The patient tolerated  the procedure well.  Injectate: 20ml 2% lidocaine 

## 2024-09-02 NOTE — Discharge Instructions (Signed)
[facility]  Discharge Instructions  AV Fistula or Graft Surgery for Dialysis Access  Please refer to the following instructions for your post-procedure care. Your surgeon or physician assistant will discuss any changes with you.  Activity  You may drive the day following your surgery, if you are comfortable and no longer taking prescription pain medication. Resume full activity as the soreness in your incision resolves.  Bathing/Showering  You may shower after you go home. Keep your incision dry for 48 hours. Do not soak in a bathtub, hot tub, or swim until the incision heals completely. You may not shower if you have a hemodialysis catheter.  Incision Care  Clean your incision with mild soap and water after 48 hours. Pat the area dry with a clean towel. You do not need a bandage unless otherwise instructed. Do not apply any ointments or creams to your incision. You may have skin glue on your incision. Do not peel it off. It will come off on its own in about one week. Your arm may swell a bit after surgery. To reduce swelling use pillows to elevate your arm so it is above your heart. Your doctor will tell you if you need to lightly wrap your arm with an ACE bandage.  Diet  Resume your normal diet. There are not special food restrictions following this procedure. In order to heal from your surgery, it is CRITICAL to get adequate nutrition. Your body requires vitamins, minerals, and protein. Vegetables are the best source of vitamins and minerals. Vegetables also provide the perfect balance of protein. Processed food has little nutritional value, so try to avoid this.  Medications  Resume taking all of your medications. If your incision is causing pain, you may take over-the counter pain relievers such as acetaminophen  (Tylenol ). If you were prescribed a stronger pain medication, please be aware these medications can cause nausea and constipation. Prevent  nausea by taking the medication with a snack or meal. Avoid constipation by drinking plenty of fluids and eating foods with high amount of fiber, such as fruits, vegetables, and grains.  Do not take Tylenol  if you are taking prescription pain medications.  Follow up Your surgeon may want to see you in the office following your access surgery. If so, this will be arranged at the time of your surgery.  Please call us  immediately for any of the following conditions:  Increased pain, redness, drainage (pus) from your incision site Fever of 101 degrees or higher Severe or worsening pain at your incision site Hand pain or numbness.  Reduce your risk of vascular disease:  Stop smoking. If you would like help, call [facility] at 1-800-QUIT-NOW (781-001-6843) or [place] at (682)460-8887  Manage your cholesterol Maintain a desired weight Control your diabetes Keep your blood pressure down  Dialysis  It will take several weeks to several months for your new dialysis access to be ready for use. Your surgeon will determine when it is okay to use it. Your nephrologist will continue to direct your dialysis. You can continue to use your Permcath until your new access is ready for use.   09/02/2024 Dwayne Rocha [id number] 1976-06-01  Surgeon(s): Pearline Norman RAMAN, MD  Procedure(s): LEFT BRACHIOCEPHALIC ARTERIOVENOUS FISTULA CREATION   May stick graft immediately   May stick graft on designated area only:   X Do not stick left AV fistula for 12 weeks    If you have any questions, please call the office at  [phone]. 

## 2024-09-03 ENCOUNTER — Encounter (HOSPITAL_COMMUNITY): Payer: Self-pay | Admitting: Vascular Surgery

## 2024-09-07 ENCOUNTER — Other Ambulatory Visit: Payer: Self-pay | Admitting: Vascular Surgery

## 2024-09-07 DIAGNOSIS — N186 End stage renal disease: Secondary | ICD-10-CM

## 2024-09-20 ENCOUNTER — Encounter: Payer: Self-pay | Admitting: "Endocrinology

## 2024-09-20 ENCOUNTER — Encounter: Payer: Self-pay | Admitting: Podiatry

## 2024-09-20 ENCOUNTER — Ambulatory Visit: Admitting: Podiatry

## 2024-09-20 ENCOUNTER — Ambulatory Visit (INDEPENDENT_AMBULATORY_CARE_PROVIDER_SITE_OTHER): Admitting: "Endocrinology

## 2024-09-20 VITALS — BP 136/80 | Ht 70.0 in | Wt 282.0 lb

## 2024-09-20 DIAGNOSIS — M79674 Pain in right toe(s): Secondary | ICD-10-CM | POA: Diagnosis not present

## 2024-09-20 DIAGNOSIS — Z9884 Bariatric surgery status: Secondary | ICD-10-CM

## 2024-09-20 DIAGNOSIS — K912 Postsurgical malabsorption, not elsewhere classified: Secondary | ICD-10-CM

## 2024-09-20 DIAGNOSIS — I739 Peripheral vascular disease, unspecified: Secondary | ICD-10-CM

## 2024-09-20 DIAGNOSIS — Z7984 Long term (current) use of oral hypoglycemic drugs: Secondary | ICD-10-CM

## 2024-09-20 DIAGNOSIS — E0840 Diabetes mellitus due to underlying condition with diabetic neuropathy, unspecified: Secondary | ICD-10-CM | POA: Diagnosis not present

## 2024-09-20 DIAGNOSIS — E11649 Type 2 diabetes mellitus with hypoglycemia without coma: Secondary | ICD-10-CM

## 2024-09-20 DIAGNOSIS — E7800 Pure hypercholesterolemia, unspecified: Secondary | ICD-10-CM

## 2024-09-20 DIAGNOSIS — B351 Tinea unguium: Secondary | ICD-10-CM

## 2024-09-20 LAB — POCT GLYCOSYLATED HEMOGLOBIN (HGB A1C): Hemoglobin A1C: 5.4 % (ref 4.0–5.6)
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
## 2024-09-20 NOTE — Patient Instructions (Signed)

## 2024-09-21 ENCOUNTER — Encounter: Payer: Self-pay | Admitting: "Endocrinology

## 2024-09-24 ENCOUNTER — Other Ambulatory Visit (HOSPITAL_COMMUNITY): Payer: Self-pay | Admitting: Vascular Surgery

## 2024-09-24 ENCOUNTER — Encounter: Payer: Self-pay | Admitting: Vascular Surgery

## 2024-09-24 ENCOUNTER — Ambulatory Visit (HOSPITAL_COMMUNITY)
Admission: RE | Admit: 2024-09-24 | Discharge: 2024-09-24 | Disposition: A | Discharge status: Home / Self Care | Source: Ambulatory Visit | Attending: Vascular Surgery | Admitting: Vascular Surgery

## 2024-09-24 ENCOUNTER — Ambulatory Visit (INDEPENDENT_AMBULATORY_CARE_PROVIDER_SITE_OTHER): Admitting: Vascular Surgery

## 2024-09-24 VITALS — BP 155/82 | HR 74 | Temp 98.1°F | Ht 70.0 in | Wt 282.0 lb

## 2024-09-24 DIAGNOSIS — N186 End stage renal disease: Secondary | ICD-10-CM | POA: Diagnosis present

## 2024-09-24 DIAGNOSIS — R209 Unspecified disturbances of skin sensation: Secondary | ICD-10-CM | POA: Diagnosis present
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
## 2024-09-28 ENCOUNTER — Encounter: Admitting: Dietician

## 2024-10-12 ENCOUNTER — Encounter: Payer: Self-pay | Admitting: Dietician

## 2024-10-12 ENCOUNTER — Encounter: Attending: "Endocrinology | Admitting: Dietician

## 2024-10-12 VITALS — Ht 70.0 in | Wt 282.8 lb

## 2024-10-12 DIAGNOSIS — K912 Postsurgical malabsorption, not elsewhere classified: Secondary | ICD-10-CM | POA: Insufficient documentation

## 2024-10-12 NOTE — Progress Notes (Signed)
 Medical Nutrition Therapy  Appointment Start time:  1014  Appointment End time:  1110  Primary concerns today: glucose lows and highs  Referral diagnosis: K91.2 Preferred learning style: no preference indicated (auditory, visual, hands on, no preference indicated) Learning readiness: ready (not ready, contemplating, ready, change in progress)  Hx of gastric by-pass surgery Surgery date: 06/10/2022 Surgery type: RYGB   NUTRITION ASSESSMENT Anthropometrics  Start weight at [facility]: 378.8 lbs (date: 01/30/2022 with prosthesis)  Height: 70 in Weight today: 282.8 pounds (with prosthesis)   Clinical  Sx: RYGB Medical hx: Gastric By-Pass surgery, DM, CKD stage 3, sleep apnea, CGM Medications:  Current Outpatient Medications on File Prior to Visit  Medication Sig Dispense Refill   acarbose  (PRECOSE ) 25 MG tablet TAKE 1 TABLET(25 MG) BY MOUTH THREE TIMES DAILY WITH MEALS. 90 tablet 1   amantadine (SYMMETREL) 100 MG capsule Take 100 mg by mouth 2 (two) times daily.     amLODipine -olmesartan  (AZOR) 10-40 MG tablet Take 1 tablet by mouth daily.     Calcium  Carb-Cholecalciferol (CALCIUM -VITAMIN D  PO) Take 1 tablet by mouth daily.     Continuous Blood Gluc Sensor (FREESTYLE LIBRE 14 DAY SENSOR) MISC Apply topically.     Continuous Glucose Receiver (FREESTYLE LIBRE 2 READER) DEVI Inject 1 Dose into the skin as directed.     Continuous Glucose Sensor (FREESTYLE LIBRE 2 SENSOR) MISC Inject 1 Dose into the skin as directed.     desvenlafaxine (PRISTIQ) 50 MG 24 hr tablet Take 50 mg by mouth every morning.     empagliflozin (JARDIANCE) 25 MG TABS tablet Take 25 mg by mouth daily.     furosemide  (LASIX ) 40 MG tablet Take 40 mg by mouth daily.     Glucagon  (GVOKE HYPOPEN  1-PACK) 1 MG/0.2ML SOAJ Inject 1 mg into the skin as needed (low blood sugar with imaopired consciousness). 0.4 mL 2   HYDROcodone -acetaminophen  (NORCO/VICODIN) 5-325 MG tablet Take 1 tablet by mouth every 6 (six) hours as needed for  severe pain (pain score 7-10). 10 tablet 0   hydrOXYzine (ATARAX) 25 MG tablet Take 25 mg by mouth at bedtime.     LOKELMA 10 g PACK packet Take 1 packet by mouth daily.     metoprolol  tartrate (LOPRESSOR ) 50 MG tablet Take 50 mg by mouth daily.     Multiple Vitamins-Minerals (ADVANCED MULTI EA PO) Take 1 tablet by mouth at bedtime.     pantoprazole  (PROTONIX ) 40 MG tablet Take 1 tablet (40 mg total) by mouth daily. 30 tablet 3   rosuvastatin  (CRESTOR ) 5 MG tablet TAKE 1 TABLET(5 MG) BY MOUTH DAILY 90 tablet 1   SUNOSI  75 MG TABS Take 75 mg by mouth every morning.     VELTASSA  8.4 g packet Take 1 packet by mouth 2 (two) times daily.     [DISCONTINUED] rivaroxaban  (XARELTO ) 10 MG TABS tablet Take 1 tablet (10 mg total) by mouth daily. (Patient not taking: No sig reported) 14 tablet 0   No current facility-administered medications on file prior to visit.    Labs:  Calcium  8.0, BUN 34, GFR 16,  Notable signs/symptoms: possibly needs another fitting for his prosthesis Any previous deficiencies? No    Lifestyle & Dietary Hx Pt states he does not measure his fluids anymore, stating he is always drinking. Pt states he is not tracking his protein, stating he is eating protein with every meal. Pt states he is the top of stage 4 kidney failure, stating he already has a port/stint in place  in his left arm preparing for dialysis. Pt states he has not received a protein restriction yet. Pt states he is just getting back to physical activity, stating he slowed down when he had an open wound on his right foot and had to have the second toe amputated. Pt states he now does not have his big toe and the second toe, stating he only has 3 toes left. Pt states he is taking Lokelma for his kidneys, stating it has been helping. Pt states his potasium has been high, stating he is avoiding bananas. Pt states he does not use a CPAP machine, stating he has one, but does not use it.  Estimated daily fluid intake: 80  oz Estimated daily protein intake: 80 g Supplements: multivitamin (Bariatric Complete brand with Iron, pt states was on sale and more affordable) and calcium  Current average weekly physical activity: ADL's   24-Hr Dietary Recall First Meal: 8 am eggs, bacon or sausage or yogurt, coffee (sometimes fruit) (cornstarch) Snack: 11 am protein with fruit (cornstarch) Second Meal: 2 pm sandwich or salad Snack: 5 pm snack Third Meal: 8 pm protein, carbohydrate Snack: yogurt with (with cornstarch per endocrinologist to avoid low sugars) Beverages: water, juice ([employer] or [employer]), lemon water, coffee  NUTRITION DIAGNOSIS  Overweight/obesity ([place]-3.3) related to past poor dietary habits and physical inactivity as evidenced by completed bariatric surgery and following dietary guidelines for continued weight loss and healthy nutrition status.   NUTRITION INTERVENTION  Nutrition counseling (C-1) and education (E-2) to facilitate bariatric surgery goals.  Educated pt on micronutrient deficiencies post-surgery and behavioral/dietary strategies to start in order to mitigate that risk   Behavioral and Dietary Interventions  Simple carbohydrates, such as juice, are rapidly absorbed into the bloodstream, causing a sharp spike in blood glucose levels. This sudden rise triggers a strong insulin  response, which can quickly lower glucose and lead to a rebound "low" that leaves you feeling fatigued, irritable, or hungry soon after. Over time, these fluctuations strain the body's ability to regulate blood sugar and may increase the risk of insulin  resistance. Choosing complex carbohydrates--like whole grains, vegetables, and legumes--provides slower, steadier energy release, helping maintain stable glucose levels and preventing those highs and lows.  Water therapy is especially valuable since it reduces joint stress, improves mobility, and allows safe movement. Combining that with gym sessions (treadmill for  cardio, weights for strength) and at-home workouts using resistance bands or free weights gives you variety and flexibility, which helps prevent boredom and keeps progress steady. Starting with 3 days per week and gradually working up to daily 30-minute sessions is smart--it allows your body to adapt while minimizing risk of injury or burnout. Over time, this mix of aerobic, strength, and low-impact aquatic exercise will support cardiovascular health, muscle tone, balance, and blood sugar regulation.  Continuing to follow bariatric food patterns with a small amount of complex carbohydrates is key for balanced nutrition and long-term success. Bariatric guidelines emphasize protein first to support healing, muscle maintenance, and satiety, followed by non-starchy vegetables for fiber and micronutrients. Including a modest portion of complex carbs--such as quinoa, oats, or sweet potato--provides steady energy without causing sharp glucose spikes, while also helping prevent fatigue and supporting physical activity. This structured approach ensures meals remain nutrient-dense, portion-controlled, and aligned with post-surgery needs.  Goals Established by Pt Increase physical activity; get back to water therapy (has a water prosthetic leg now); gym (treadmill and weights); at home on an app and free weights and resistance bands, 3 days  per week, working up to daily, 30 minutes per day. Avoid simple carbohydrates, like juice, to avoid glucose spikes leading to lows. Continue following bariatric food patterns, including a small amount of complex carbohydrates  Handouts Provided Include  Bariatric Maintenance Plan (2 years and beyond)  Learning Style & Readiness for Change Teaching method utilized: Visual, Auditory, and hands on  Demonstrated degree of understanding via: Teach Back  Readiness Level: ready Barriers to learning/adherence to lifestyle change: nothing identified  RD's Notes for Next  Visit Patient progress towards chosen goals   MONITORING & EVALUATION Dietary intake, weekly physical activity, body weight, and preoperative behavioral change goals   Next Steps  Patient is to follow up at [facility] in 2-3 months.

## 2024-10-13 ENCOUNTER — Encounter: Payer: Self-pay | Admitting: Podiatry

## 2024-10-13 ENCOUNTER — Encounter: Payer: Self-pay | Admitting: Lab

## 2024-10-14 NOTE — Progress Notes (Unsigned)
    Postoperative Access Visit   History of Present Illness   Dwayne Rocha is a 48 y.o. year old male who presents for postoperative follow-up for: {side of body:30421359} {AccessOptions:19197::thigh arteriovenous graft,upper arm arteriovenous graft,forearm arteriovenous graft,first stage brachial vein transposition,second stage brachial vein transposition,single stage brachial vein transposition,first stage basilic vein transposition,second stage basilic vein transposition,single stage basilic vein transposition,radiocephalic arteriovenous fistula,brachiocephalic arteriovenous fistula} (Date: ***).  The patient's wounds are *** healed.  The patient notes *** steal symptoms.  The patient is *** able to complete their activities of daily living.  The patient's current symptoms are: ***.   Physical Examination  There were no vitals filed for this visit. There is no height or weight on file to calculate BMI.  {side of body:30421359} arm Incision is *** healed, *** radial pulse, hand grip is ***/5, sensation in digits is *** intact, ***palpable thrill, bruit can *** be auscultated     Non Invasive Vascular lab evaluation   VAS US  Duplex Dialysis Access (AVF, AVG): ***  Medical Decision Making   Dwayne Rocha is a 48 y.o. year old male who presents s/p {side of body:30421359} {AccessOptions:19197::thigh arteriovenous graft,upper arm arteriovenous graft,forearm arteriovenous graft,first stage brachial vein transposition,second stage brachial vein transposition,single stage brachial vein transposition,first stage basilic vein transposition,second stage basilic vein transposition,single stage basilic vein transposition,radiocephalic arteriovenous fistula,brachiocephalic arteriovenous fistula}  Patent *** without signs or symptoms of steal syndrome The patient's access will be ready for use *** ***The patient's tunneled dialysis catheter can be  removed when Nephrology is comfortable with the performance of the *** The patient may follow up on a prn basis   [name], PA-C [facility] [place] Office: [phone]  Clinic MD: Pearline

## 2024-10-15 ENCOUNTER — Ambulatory Visit (HOSPITAL_COMMUNITY): Admission: RE | Admit: 2024-10-15 | Discharge: 2024-10-15 | Disposition: A | Attending: Vascular Surgery

## 2024-10-15 ENCOUNTER — Ambulatory Visit: Admitting: Physician Assistant

## 2024-10-15 ENCOUNTER — Encounter: Payer: Self-pay | Admitting: Physician Assistant

## 2024-10-15 VITALS — BP 136/82 | HR 87 | Temp 98.5°F | Wt 284.9 lb

## 2024-10-15 DIAGNOSIS — N186 End stage renal disease: Secondary | ICD-10-CM

## 2024-10-15 DIAGNOSIS — N185 Chronic kidney disease, stage 5: Secondary | ICD-10-CM

## 2024-10-18 ENCOUNTER — Other Ambulatory Visit: Payer: Self-pay

## 2024-10-18 DIAGNOSIS — N186 End stage renal disease: Secondary | ICD-10-CM

## 2024-10-31 ENCOUNTER — Other Ambulatory Visit: Payer: Self-pay | Admitting: "Endocrinology

## 2024-10-31 DIAGNOSIS — K912 Postsurgical malabsorption, not elsewhere classified: Secondary | ICD-10-CM

## 2024-11-02 ENCOUNTER — Ambulatory Visit: Admitting: Orthopaedic Surgery

## 2024-11-02 DIAGNOSIS — G5622 Lesion of ulnar nerve, left upper limb: Secondary | ICD-10-CM
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
## 2024-11-22 ENCOUNTER — Encounter: Payer: Self-pay | Admitting: Podiatry
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
## 2024-11-29 ENCOUNTER — Ambulatory Visit: Admitting: Orthopedic Surgery

## 2024-11-29 DIAGNOSIS — G5622 Lesion of ulnar nerve, left upper limb: Secondary | ICD-10-CM | POA: Diagnosis not present

## 2024-12-09 NOTE — Progress Notes (Signed)
 "   Established Dialysis Access   History of Present Illness   Dwayne Rocha is a 49 y.o. ([birth year]) male who presents for re-evaluation of left AV fistula maturation. He underwent left arm brachiocephalic AVF creation on 09/02/24 by Dr. Pearline. Post operatively he developed progressively worsening left hand numbness and tingling.We did not feel that this was steal as he had good perfusion to the left hand on exam and with doppler evaluation. He was therefore referred to Ortho Hand with concern that his arm numbness and weakness was more neuropathic. He was evaluated by Dr. Jerri who felt that he had severe left cubital tunnel syndrome with intrinsic hand muscle atrophy. Surgical intervention was recommended due to chronic symptoms and muscle atrophy. He was then referred to hand surgery (Dr. Arlinda) for surgical evaluation and management. Dr. Arlinda recommended Left elbow cubital tunnel release in the near future, however he felt it would be difficult to predict if all of his symptoms would resolve given the severity of his condition preoperatively.He was set up for electrodiagnostic studies in order to better delineate the site and severity of his ongoing condition. He is scheduled to follow up with Dr. Arlinda next month after his testing.  He currently is still no on HD. He has follow up with his Nephrologist, Dr. Norine next week  Current Outpatient Medications  Medication Sig Dispense Refill   acarbose  (PRECOSE ) 25 MG tablet TAKE 1 TABLET(25 MG) BY MOUTH THREE TIMES DAILY WITH MEALS. 90 tablet 1   amantadine (SYMMETREL) 100 MG capsule Take 100 mg by mouth 2 (two) times daily.     amLODipine -olmesartan  (AZOR) 10-40 MG tablet Take 1 tablet by mouth daily.     Calcium  Carb-Cholecalciferol (CALCIUM -VITAMIN D  PO) Take 1 tablet by mouth daily.     Continuous Blood Gluc Sensor (FREESTYLE LIBRE 14 DAY SENSOR) MISC Apply topically.     Continuous Glucose Receiver (FREESTYLE LIBRE 2 READER) DEVI  Inject 1 Dose into the skin as directed.     Continuous Glucose Sensor (FREESTYLE LIBRE 2 SENSOR) MISC Inject 1 Dose into the skin as directed.     desvenlafaxine (PRISTIQ) 50 MG 24 hr tablet Take 50 mg by mouth every morning.     empagliflozin (JARDIANCE) 25 MG TABS tablet Take 25 mg by mouth daily.     Glucagon  (GVOKE HYPOPEN  1-PACK) 1 MG/0.2ML SOAJ Inject 1 mg into the skin as needed (low blood sugar with imaopired consciousness). 0.4 mL 2   HYDROcodone -acetaminophen  (NORCO/VICODIN) 5-325 MG tablet Take 1 tablet by mouth every 6 (six) hours as needed for severe pain (pain score 7-10). 10 tablet 0   hydrOXYzine (ATARAX) 25 MG tablet Take 25 mg by mouth at bedtime.     LOKELMA 10 g PACK packet Take 1 packet by mouth daily.     metoprolol  tartrate (LOPRESSOR ) 50 MG tablet Take 50 mg by mouth daily.     Multiple Vitamins-Minerals (ADVANCED MULTI EA PO) Take 1 tablet by mouth at bedtime.     rosuvastatin  (CRESTOR ) 5 MG tablet TAKE 1 TABLET(5 MG) BY MOUTH DAILY 90 tablet 1   SUNOSI  75 MG TABS Take 75 mg by mouth every morning.     VELTASSA  8.4 g packet Take 1 packet by mouth 2 (two) times daily.     furosemide  (LASIX ) 40 MG tablet Take 40 mg by mouth daily. (Patient not taking: Reported on 12/10/2024)     pantoprazole  (PROTONIX ) 40 MG tablet Take 1 tablet (40 mg total) by mouth daily. (  Patient not taking: Reported on 12/10/2024) 30 tablet 3   No current facility-administered medications for this visit.    On ROS today: negative unless stated in HPI   Physical Examination   Vitals:   12/10/24 0914  BP: (!) 173/90  Pulse: 79  Weight: 284 lb 12.8 oz (129.2 kg)  Height: 5' 10 (1.778 m)   Body mass index is 40.86 kg/m.  General WDWN in no acute distress  Pulmonary Non labored  Cardiac regular  Vascular Left AV fistula with good thrill. Easily palpable in left upper arm. 2+ left radial pulse   Musculo- skeletal M/S 5/5 throughout  , Extremities without ischemic changes    Neurologic  A&O; CN grossly intact     Non-invasive Vascular Imaging   left Arm Access Duplex  (12/10/24):   Summary:  Arteriovenous fistula-Elevated velocities and a velocity ratio of greater than 3 noted. Patent left BC AVF. Flow volume = 497 ml/min.      Medical Decision Making   Dwayne Rocha is a 49 y.o. male who presents for re-evaluation of left AV fistula maturation. He underwent left arm brachiocephalic AVF creation on 09/02/24 by Dr. Pearline. He has been exercising his arm over past 6-8 weeks. Duplex today shows patent fistula. Fistula has matured more but volume flow has decreased slightly from prior studies. He does have some noted competing branches, however on exam the fistula has a great thrill. Presently I don't think he needs any branch ligation. Recommend he continue to exercise his arm. He will follow up with Dr. Norine to determine if he needs to start HD soon. The fistula is okay to use at this time should he need to start HD in the near future. Should there be any issues with cannulating the fistula or function we would discuss possible branch ligation at later date if it becomes an issue. He can follow up with us  as needed.    [name], PA-C [facility] Office: [phone]  Clinic MD: Pearline  "

## 2024-12-10 ENCOUNTER — Ambulatory Visit (INDEPENDENT_AMBULATORY_CARE_PROVIDER_SITE_OTHER): Admitting: Physician Assistant

## 2024-12-10 ENCOUNTER — Ambulatory Visit (HOSPITAL_COMMUNITY)
Admission: RE | Admit: 2024-12-10 | Discharge: 2024-12-10 | Disposition: A | Source: Ambulatory Visit | Attending: Surgery | Admitting: Surgery

## 2024-12-10 VITALS — BP 173/90 | HR 79 | Ht 70.0 in | Wt 284.8 lb

## 2024-12-10 DIAGNOSIS — N186 End stage renal disease: Secondary | ICD-10-CM | POA: Diagnosis present

## 2024-12-21 ENCOUNTER — Ambulatory Visit: Admitting: "Endocrinology

## 2024-12-21 ENCOUNTER — Encounter: Admitting: Physical Medicine and Rehabilitation

## 2024-12-22 ENCOUNTER — Ambulatory Visit: Admitting: Podiatry

## 2025-01-10 ENCOUNTER — Encounter: Admitting: Dietician
